# Patient Record
Sex: Female | Born: 1940 | Race: White | Hispanic: No | Smoking: Never smoker
Health system: Southern US, Community
[De-identification: ages and names within clinical notes are randomized; demographics above are authoritative.]

## PROBLEM LIST (undated history)

## (undated) DIAGNOSIS — I480 Paroxysmal atrial fibrillation: Secondary | ICD-10-CM

## (undated) DIAGNOSIS — I10 Essential (primary) hypertension: Secondary | ICD-10-CM

## (undated) DIAGNOSIS — R0600 Dyspnea, unspecified: Principal | ICD-10-CM

## (undated) DIAGNOSIS — Z0000 Encounter for general adult medical examination without abnormal findings: Secondary | ICD-10-CM

## (undated) DIAGNOSIS — E7800 Pure hypercholesterolemia, unspecified: Secondary | ICD-10-CM

## (undated) DIAGNOSIS — G4733 Obstructive sleep apnea (adult) (pediatric): Secondary | ICD-10-CM

## (undated) DIAGNOSIS — R928 Other abnormal and inconclusive findings on diagnostic imaging of breast: Secondary | ICD-10-CM

## (undated) DIAGNOSIS — L600 Ingrowing nail: Secondary | ICD-10-CM

## (undated) DIAGNOSIS — M1612 Unilateral primary osteoarthritis, left hip: Secondary | ICD-10-CM

## (undated) DIAGNOSIS — R112 Nausea with vomiting, unspecified: Secondary | ICD-10-CM

## (undated) DIAGNOSIS — Z9889 Other specified postprocedural states: Secondary | ICD-10-CM

## (undated) DIAGNOSIS — Z96653 Presence of artificial knee joint, bilateral: Secondary | ICD-10-CM

## (undated) DIAGNOSIS — IMO0002 Reserved for concepts with insufficient information to code with codable children: Secondary | ICD-10-CM

## (undated) DIAGNOSIS — R002 Palpitations: Secondary | ICD-10-CM

## (undated) DIAGNOSIS — Z923 Personal history of irradiation: Secondary | ICD-10-CM

## (undated) DIAGNOSIS — C50919 Malignant neoplasm of unspecified site of unspecified female breast: Secondary | ICD-10-CM

## (undated) DIAGNOSIS — R32 Unspecified urinary incontinence: Secondary | ICD-10-CM

## (undated) DIAGNOSIS — IMO0001 Reserved for inherently not codable concepts without codable children: Secondary | ICD-10-CM

## (undated) DIAGNOSIS — M2550 Pain in unspecified joint: Secondary | ICD-10-CM

## (undated) DIAGNOSIS — D126 Benign neoplasm of colon, unspecified: Secondary | ICD-10-CM

## (undated) DIAGNOSIS — Z973 Presence of spectacles and contact lenses: Secondary | ICD-10-CM

## (undated) DIAGNOSIS — M549 Dorsalgia, unspecified: Secondary | ICD-10-CM

## (undated) DIAGNOSIS — E785 Hyperlipidemia, unspecified: Secondary | ICD-10-CM

## (undated) DIAGNOSIS — R609 Edema, unspecified: Secondary | ICD-10-CM

## (undated) DIAGNOSIS — I4891 Unspecified atrial fibrillation: Secondary | ICD-10-CM

## (undated) DIAGNOSIS — K219 Gastro-esophageal reflux disease without esophagitis: Secondary | ICD-10-CM

## (undated) DIAGNOSIS — R0602 Shortness of breath: Secondary | ICD-10-CM

## (undated) HISTORY — DX: Dorsalgia, unspecified: M54.9

## (undated) HISTORY — PX: UPPER GI ENDOSCOPY: SHX6162

## (undated) HISTORY — DX: Unspecified atrial fibrillation: I48.91

## (undated) HISTORY — DX: Unspecified urinary incontinence: R32

## (undated) HISTORY — DX: Reserved for concepts with insufficient information to code with codable children: IMO0002

## (undated) HISTORY — DX: Shortness of breath: R06.02

## (undated) HISTORY — DX: Pain in unspecified joint: M25.50

## (undated) HISTORY — DX: Reserved for inherently not codable concepts without codable children: IMO0001

## (undated) HISTORY — DX: Edema, unspecified: R60.9

## (undated) HISTORY — DX: Hyperlipidemia, unspecified: E78.5

## (undated) HISTORY — DX: Benign neoplasm of colon, unspecified: D12.6

## (undated) HISTORY — DX: Palpitations: R00.2

## (undated) HISTORY — DX: Malignant neoplasm of unspecified site of unspecified female breast: C50.919

## (undated) HISTORY — DX: Gastro-esophageal reflux disease without esophagitis: K21.9

## (undated) HISTORY — DX: Essential (primary) hypertension: I10

---
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
## 1966-01-01 HISTORY — PX: ECTOPIC PREGNANCY SURGERY: SHX613

## 1974-01-01 HISTORY — PX: TUBAL LIGATION: SHX77

## 1998-10-11 ENCOUNTER — Encounter: Admission: RE | Admit: 1998-10-11 | Discharge: 1998-10-11 | Payer: Self-pay | Admitting: Family Medicine

## 1998-10-25 ENCOUNTER — Other Ambulatory Visit: Admission: RE | Admit: 1998-10-25 | Discharge: 1998-10-25 | Payer: Self-pay | Admitting: Family Medicine

## 1998-11-07 ENCOUNTER — Encounter: Admission: RE | Admit: 1998-11-07 | Discharge: 1998-11-07 | Payer: Self-pay | Admitting: Family Medicine

## 1998-11-07 ENCOUNTER — Encounter: Payer: Self-pay | Admitting: Family Medicine

## 1999-01-02 HISTORY — PX: KNEE ARTHROSCOPY: SUR90

## 1999-03-21 ENCOUNTER — Encounter: Admission: RE | Admit: 1999-03-21 | Discharge: 1999-03-21 | Payer: Self-pay | Admitting: Family Medicine

## 1999-03-21 ENCOUNTER — Encounter: Payer: Self-pay | Admitting: Family Medicine

## 1999-03-23 ENCOUNTER — Encounter: Payer: Self-pay | Admitting: Family Medicine

## 1999-03-23 ENCOUNTER — Encounter: Admission: RE | Admit: 1999-03-23 | Discharge: 1999-03-23 | Payer: Self-pay | Admitting: Family Medicine

## 1999-05-31 ENCOUNTER — Ambulatory Visit (HOSPITAL_COMMUNITY): Admission: RE | Admit: 1999-05-31 | Discharge: 1999-05-31 | Payer: Self-pay | Admitting: Gastroenterology

## 1999-05-31 ENCOUNTER — Encounter: Payer: Self-pay | Admitting: Gastroenterology

## 1999-07-26 ENCOUNTER — Encounter: Admission: RE | Admit: 1999-07-26 | Discharge: 1999-07-26 | Payer: Self-pay | Admitting: Internal Medicine

## 1999-07-26 ENCOUNTER — Encounter: Payer: Self-pay | Admitting: Internal Medicine

## 1999-10-18 ENCOUNTER — Encounter: Payer: Self-pay | Admitting: Family Medicine

## 1999-10-18 ENCOUNTER — Encounter: Admission: RE | Admit: 1999-10-18 | Discharge: 1999-10-18 | Payer: Self-pay | Admitting: Family Medicine

## 2000-09-10 ENCOUNTER — Other Ambulatory Visit: Admission: RE | Admit: 2000-09-10 | Discharge: 2000-09-10 | Payer: Self-pay | Admitting: Family Medicine

## 2000-10-18 ENCOUNTER — Encounter: Admission: RE | Admit: 2000-10-18 | Discharge: 2000-10-18 | Payer: Self-pay | Admitting: Family Medicine

## 2000-10-18 ENCOUNTER — Encounter: Payer: Self-pay | Admitting: Family Medicine

## 2001-06-05 ENCOUNTER — Encounter: Admission: RE | Admit: 2001-06-05 | Discharge: 2001-06-05 | Payer: Self-pay | Admitting: Family Medicine

## 2001-06-05 ENCOUNTER — Encounter: Payer: Self-pay | Admitting: Family Medicine

## 2005-05-16 ENCOUNTER — Emergency Department (HOSPITAL_COMMUNITY): Admission: EM | Admit: 2005-05-16 | Discharge: 2005-05-16 | Payer: Self-pay | Admitting: Emergency Medicine

## 2006-01-01 HISTORY — PX: CARDIAC CATHETERIZATION: SHX172

## 2006-12-10 ENCOUNTER — Ambulatory Visit: Payer: Self-pay | Admitting: Cardiology

## 2006-12-11 ENCOUNTER — Inpatient Hospital Stay (HOSPITAL_BASED_OUTPATIENT_CLINIC_OR_DEPARTMENT_OTHER): Admission: RE | Admit: 2006-12-11 | Discharge: 2006-12-11 | Payer: Self-pay | Admitting: Cardiology

## 2006-12-11 ENCOUNTER — Ambulatory Visit: Payer: Self-pay | Admitting: Cardiology

## 2006-12-16 ENCOUNTER — Ambulatory Visit: Payer: Self-pay

## 2006-12-16 ENCOUNTER — Encounter: Payer: Self-pay | Admitting: Cardiology

## 2006-12-18 ENCOUNTER — Ambulatory Visit: Payer: Self-pay | Admitting: Cardiology

## 2008-09-22 ENCOUNTER — Encounter: Admission: RE | Admit: 2008-09-22 | Discharge: 2008-09-22 | Payer: Self-pay | Admitting: Internal Medicine

## 2009-07-18 ENCOUNTER — Emergency Department (HOSPITAL_COMMUNITY): Admission: EM | Admit: 2009-07-18 | Discharge: 2009-07-18 | Payer: Self-pay | Admitting: Emergency Medicine

## 2009-10-04 ENCOUNTER — Encounter: Admission: RE | Admit: 2009-10-04 | Discharge: 2009-10-04 | Payer: Self-pay | Admitting: Internal Medicine

## 2010-01-11 ENCOUNTER — Encounter: Payer: Self-pay | Admitting: Gastroenterology

## 2010-02-01 ENCOUNTER — Encounter: Payer: Self-pay | Admitting: Internal Medicine

## 2010-02-02 NOTE — Letter (Signed)
Summary: Colonoscopy Letter   Gastroenterology  [address] [place], [place] [address]   Phone: [phone]  Fax: [phone]      January 11, 2010 MRN: [id number]   Gabriella Walker [address] [place], [place]  [address]   Dear Ms. Suchocki,   According to your medical record, it is time for you to schedule a Colonoscopy. The American Cancer Society recommends this procedure as a method to detect early colon cancer. Patients with a family history of colon cancer, or a personal history of colon polyps or inflammatory bowel disease are at increased risk.  This letter has been generated based on the recommendations made at the time of your procedure. If you feel that in your particular situation this may no longer apply, please contact our office.  Please call our office at [phone] to schedule this appointment or to update your records at your earliest convenience.  Thank you for cooperating with [place] to provide you with the very best care possible.   Sincerely,  Judie Petit T. Russella Dar, M.D.  [facility] Gastroenterology [facility] [phone]

## 2010-02-14 ENCOUNTER — Other Ambulatory Visit: Payer: Self-pay | Admitting: Internal Medicine

## 2010-02-14 DIAGNOSIS — Z1231 Encounter for screening mammogram for malignant neoplasm of breast: Secondary | ICD-10-CM

## 2010-02-28 ENCOUNTER — Ambulatory Visit
Admission: RE | Admit: 2010-02-28 | Discharge: 2010-02-28 | Disposition: A | Discharge status: Home / Self Care | Payer: Medicare Other | Source: Ambulatory Visit | Attending: Internal Medicine | Admitting: Internal Medicine

## 2010-02-28 DIAGNOSIS — Z1231 Encounter for screening mammogram for malignant neoplasm of breast: Secondary | ICD-10-CM

## 2010-05-16 NOTE — Assessment & Plan Note (Signed)
[facility]                            CARDIOLOGY OFFICE NOTE   HEILEY, SHAIKH                      MRN:          [id number]  DATE:12/18/2006                            DOB:          February 10, 1940    Gabriella Walker is a pleasant female that I recently evaluated for  exertional dyspnea/chest pain.  Her symptoms, I felt, were very  concerning for coronary disease.  We ultimately scheduled the patient  for a cardiac catheterization which was performed on December 11, 2006  by Dr. Riley Kill.  She was found to have no obstructive coronary disease.  Her left ventricular function was normal.  Dr. Riley Kill felt that there  was a wide pulse pressure, and an echocardiogram was ordered and  performed on December 16, 2006.  Her left ventricular function was  normal.  There was mild mitral regurgitation.  The left atrium was  mildly dilated.  There was mild tricuspid regurgitation.  Note, the  right ventricle and right atrium were normal in size.  There is mention  that the aortic root was mildly dilated, but the measurement was 3.5 cm.  Since that time, she has had some dyspnea on exertion as well as chest  heaviness.  This does not occur with rest.  There is no orthopnea, PND,  or pedal edema.  Note, she did travel to [place] in November.   PRESENT MEDICATIONS:  1. Diovan 160 mg p.o. daily.  2. Zocor 20 mg p.o. daily.  3. Metoprolol ER 50 mg p.o. daily.  4. Imdur 15 mg p.o. daily.   PHYSICAL EXAMINATION:  Shows a blood pressure of 127/69 and the pulse  was 59.  She weighs 201 pounds.  Her HEENT is normal.  Her neck is supple.  Her chest is clear.  Cardiovascular exam reveals a regular rate and rhythm.  Abdominal exam shows no tenderness.  Extremities show no edema.   DIAGNOSES:  1. Continued exertional dyspnea/chest pain:  Mrs. Salts      catheterization showed no obstructive coronary disease and normal      left ventricular function.  The  etiology of her symptoms is unclear      to me.  Given her recent travel to [place], I think we need      to consider pulmonary embolus.  I will schedule her to have a D-      dimer.  If this is elevated, then she will need a CT scan.  If it      is normal, then we will not pursue further evaluation and I will      ask her to follow up with her primary care physician.  We will also      discontinue her Imdur.  2. Hyperlipidemia:  She will continue on her statin, and this is being      managed by Dr. Duaine Dredge.  3. Hypertension:  Her blood pressure is adequately controlled.   We will see her back on an as-needed basis if her D-dimer is normal.     [name]. Jens Som, MD, [facility]  Electronically Signed  BSC/MedQ  DD: 12/18/2006  DT: 12/18/2006  Job #: [id number]   cc:   Mosetta Putt, M.D.

## 2010-05-16 NOTE — Assessment & Plan Note (Signed)
[facility]                            CARDIOLOGY OFFICE NOTE   NIANI, MOURER                      MRN:          [id number]  DATE:12/10/2006                            DOB:          [birth year]    Gabriella Walker is a very pleasant 70 year old female who I am asked to  evaluate for chest pain and dyspnea.   The patient has no prior cardiac history.  In the past she has not had  dyspnea on exertion, orthopnea, PND, pedal edema, palpitations,  presyncope, syncope or chest pain.  Over the past 3-4 weeks she has  noticed increased weakness as well as shortness of breath with exertion.  There is no orthopnea, PND or pedal edema.  She has also had a chest  pressure with exertion, that is relieved with rest.  This typically  occurs with more extreme activities, but not with routine activities  around the house.  She has had no symptoms at rest.  The pain does not  radiate, no is it pleuritic or positional.  It is not related to food.  There is no associated nausea, vomiting, shortness of breath or  diaphoresis.  It typically lasts for approximately 5 minutes and  resolves promptly with discontinuing activities.  Note, she has not had  any in the past 48 hours.  Because of the above, we were asked to  further evaluate.   CURRENT MEDICATIONS:  1. Diovan 160 mg p.o. daily.  2. Zocor 20 mg p.o. daily.  3. Toprol 50 mg p.o. daily.   ALLERGIES:  She has NO KNOWN DRUG ALLERGIES.   SOCIAL HISTORY:  She does not smoke nor does she consume alcohol.   FAMILY HISTORY:  Positive for coronary artery disease, as her father had  a myocardial infarction at age 68.   PAST MEDICAL HISTORY:  Significant for hypertension and hyperlipidemia;  but there is no diabetes mellitus.  She has had no other medical  problems.  She does have a history of a tubal pregnancy as well as an  appendectomy.   REVIEW OF SYSTEMS:  She denies any headaches, fevers or chills.  There  is no productive cough or hemoptysis.  There is no dysphagia,  odynophagia, melena or hematochezia.  There is no dysuria or hematuria.  There is no seizure activity.  There is no orthopnea, PND or pedal  edema.  There is no claudication noted.  Remainder of the systems is  negative.   PHYSICAL EXAMINATION:  Today, shows a blood pressure of 129/75, pulse  69.  She weighs 202 pounds.  She is well developed and well nourished;  no acute distress.  Her skin is warm and dry.  She does not appear to be  depressed, and there is no peripheral clubbing.  Her back is normal.  HEENT:  Normal, with normal eyelids.  NECK:  Supple with a normal upstroke bilaterally.  There were no bruits  noted.  There was no jugular venous distention and no thyromegaly is  noted.  CHEST:  Clear to auscultation with normal expansion.  CARDIOVASCULAR:  Reveals a regular  rate and rhythm, normal S1 and S2.  There are no murmurs, rubs or gallops noted.  ABDOMEN:  Nontender, nondistended.  Positive bowel sounds.  No  hepatosplenomegaly and no masses appreciated.  There is no abdominal  bruit.  She has 2+ femoral pulses bilaterally.  No bruits.  EXTREMITIES:  Showed no edema that I could palpate, no cords.  She has  2+ posterior tibial pulses bilaterally.  NEUROLOGIC:  Exam was grossly intact.   EKG:  Her echocardiogram shows a sinus rhythm at a rate of 69.  There  are nonspecific ST changes.   CHEST X-RAY:  (December 05, 2006)  Shows no active disease.   LABORATORY STUDIES:  White blood cell count 7.6, hemoglobin 13.8,  hematocrit 39.6.  Her platelet count is 203.  Renal function is normal,  with a BUN and creatinine of 14 and 0.75.  Cholesterol 170, LDL 70.   DIAGNOSES:  1. 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  Ms. Chiu chest pain is consistent      with angina.  It has been present for approximately one month.      They occur with exertion and are relieved with rest.  Note, this      occurs with more extreme  activities, but not with routine      activities around the house.  She has had no symptoms at rest.  I      have discussed the options with Ms. Maiorino.  I feel that cardiac      catheterization is warranted.  The risks and benefits have been      discussed, and she agrees to proceed.  She is trying to make      arrangements for care for her husband at home, who has had a      previous stroke.  Note, she has had no symptoms in the past 48      hours.  She will continue on her aspirin, Lopressor and statin.  I      will also add Imdur 30 mg p.o. daily.  I have instructed her to      limit her activities until her catheterization, and if she has any      symptoms at rest then EMS should be contacted and she should be      taken to the hospital.  She does understand this.  We will make      further recommendations once we know her anatomy.  2. HYPERLIPIDEMIA.  She will continue on her statin.  If she indeed      does have coronary disease, then we will need to be aggressive with      her lipids.  Our goal LDL will be less than 70.  3. HYPERTENSION.  Her blood pressure appears to be adequately      controlled on her present medications.   I will arrange to see her back in 4 weeks.  In the meantime, she will  undergo cardiac catheterization and revascularization as indicated.     [name] Jens Som, MD, [facility]  Electronically Signed   BSC/MedQ  DD: 12/10/2006  DT: 12/11/2006  Job #: [id number]

## 2010-05-16 NOTE — Cardiovascular Report (Signed)
[name], Gabriella Walker               ACCOUNT NO.:  [id number]   MEDICAL RECORD NO.:  [id number]          PATIENT TYPE:  OIB   LOCATION:  NA                           FACILITY:  [facility]   PHYSICIAN:  Arturo Morton. Riley Kill, MD, FACCDATE OF BIRTH:  August 05, 1940   DATE OF PROCEDURE:  DATE OF DISCHARGE:                            CARDIAC CATHETERIZATION   INDICATIONS:  The patient is a 70 year old female who has had some  shortness of breath as well as some chest discomfort.  She has no  orthopnea, PND or pedal edema.  She has had no symptoms at rest.  Her  examination was unremarkable.  Chest x-ray from December 4 revealed no  active disease.  LDL cholesterol 70.  Dr. Jens Walker was worried about her  symptoms and recommended cardiac catheterization.  She does have a  history of hypertension.  She has not had an echocardiogram.   PROCEDURE:  1. Left heart catheterization  2. Selective coronary arteriography.  3. Selective left ventriculography.   DESCRIPTION OF PROCEDURE:  The patient was brought to the  catheterization laboratory and prepped and draped in usual fashion.  Through an anterior puncture the right femoral artery was easily  entered.  A 4-French sheath was placed.  Following this, views of left  and right coronaries arteries were obtained, central aortic and left  ventricular pressures measured with pigtail. Ventriculography was then  performed in the RAO projection.  There were no complications.  She  tolerated the procedure well.  Subsequent to this, her catheter was  removed on the table and I spoke Dr. Jens Walker after the case.   Initial central aortic pressure was 121/56 with a mean of 82.  Left  ventricular pressure was 123/15.  There was no significant gradient on  pullback across the aortic valve.   ANGIOGRAPHIC DATA:  1. Ventriculography in the RAO projection reveals vigorous global      systolic function.  Aortic leaflets appear to move well.  The aorta      itself looks  relatively smooth.  2. The left main coronary artery is free of critical disease.  3. The LAD courses to the apex.  There is minor luminal      irregularities.  Areas of high-grade stenosis were not noted on any      of views.  There is a diagonal that has minimal luminal      irregularity at the ostium, but no evidence of high-grade      obstruction.  4. The circumflex consists predominantly of a bifurcating large      marginal branch that appears free of critical disease.  5. The right coronary artery is also fairly large and relatively      smooth. It is a fairly large lumen providing a posterior descending      branch, a small posterolateral and larger posterolateral branch.      The posterior descending may have about 30% narrowing at the      ostium.  This does not appear to be hemodynamically significant.   CONCLUSION:  1. Normal left ventricular function  2. Minor coronary  irregularities   PLAN:  Dr. Jens Walker was impressed with her symptoms.  Based on this, we  will get an early 2-D echo with early follow-up in the office.      Arturo Morton. Riley Kill, MD, [facility]  Electronically Signed     TDS/MEDQ  D:  12/11/2006  T:  12/11/2006  Job:  [id number]   cc:   Gabriella Walker. Gabriella Som, MD, [facility]

## 2011-03-13 ENCOUNTER — Other Ambulatory Visit: Payer: Self-pay | Admitting: Internal Medicine

## 2011-03-13 DIAGNOSIS — Z1231 Encounter for screening mammogram for malignant neoplasm of breast: Secondary | ICD-10-CM

## 2011-03-26 DIAGNOSIS — H2510 Age-related nuclear cataract, unspecified eye: Secondary | ICD-10-CM | POA: Diagnosis not present

## 2011-04-03 ENCOUNTER — Ambulatory Visit
Admission: RE | Admit: 2011-04-03 | Discharge: 2011-04-03 | Disposition: A | Discharge status: Home / Self Care | Payer: Medicare Other | Source: Ambulatory Visit | Attending: Internal Medicine | Admitting: Internal Medicine

## 2011-04-03 DIAGNOSIS — Z1231 Encounter for screening mammogram for malignant neoplasm of breast: Secondary | ICD-10-CM | POA: Diagnosis not present

## 2011-09-11 DIAGNOSIS — T148 Other injury of unspecified body region: Secondary | ICD-10-CM | POA: Diagnosis not present

## 2011-09-11 DIAGNOSIS — I10 Essential (primary) hypertension: Secondary | ICD-10-CM | POA: Diagnosis not present

## 2011-09-11 DIAGNOSIS — IMO0001 Reserved for inherently not codable concepts without codable children: Secondary | ICD-10-CM | POA: Diagnosis not present

## 2011-09-11 DIAGNOSIS — R21 Rash and other nonspecific skin eruption: Secondary | ICD-10-CM | POA: Diagnosis not present

## 2011-09-11 DIAGNOSIS — M542 Cervicalgia: Secondary | ICD-10-CM | POA: Diagnosis not present

## 2011-09-17 ENCOUNTER — Encounter: Payer: Self-pay | Admitting: Gastroenterology

## 2011-10-03 DIAGNOSIS — M899 Disorder of bone, unspecified: Secondary | ICD-10-CM | POA: Diagnosis not present

## 2011-10-03 DIAGNOSIS — R7301 Impaired fasting glucose: Secondary | ICD-10-CM | POA: Diagnosis not present

## 2011-10-03 DIAGNOSIS — I10 Essential (primary) hypertension: Secondary | ICD-10-CM | POA: Diagnosis not present

## 2011-10-03 DIAGNOSIS — M949 Disorder of cartilage, unspecified: Secondary | ICD-10-CM | POA: Diagnosis not present

## 2011-10-03 DIAGNOSIS — E785 Hyperlipidemia, unspecified: Secondary | ICD-10-CM | POA: Diagnosis not present

## 2011-10-10 DIAGNOSIS — Z0000 Encounter for general adult medical examination without abnormal findings: Secondary | ICD-10-CM | POA: Diagnosis not present

## 2011-10-10 DIAGNOSIS — R82998 Other abnormal findings in urine: Secondary | ICD-10-CM | POA: Diagnosis not present

## 2011-10-10 DIAGNOSIS — I10 Essential (primary) hypertension: Secondary | ICD-10-CM | POA: Diagnosis not present

## 2011-10-10 DIAGNOSIS — R609 Edema, unspecified: Secondary | ICD-10-CM | POA: Diagnosis not present

## 2011-10-10 DIAGNOSIS — R7301 Impaired fasting glucose: Secondary | ICD-10-CM | POA: Diagnosis not present

## 2011-10-10 DIAGNOSIS — Z23 Encounter for immunization: Secondary | ICD-10-CM | POA: Diagnosis not present

## 2011-10-10 DIAGNOSIS — Z1331 Encounter for screening for depression: Secondary | ICD-10-CM | POA: Diagnosis not present

## 2011-10-11 DIAGNOSIS — Z1212 Encounter for screening for malignant neoplasm of rectum: Secondary | ICD-10-CM | POA: Diagnosis not present

## 2011-10-30 DIAGNOSIS — L821 Other seborrheic keratosis: Secondary | ICD-10-CM | POA: Diagnosis not present

## 2011-10-30 DIAGNOSIS — L280 Lichen simplex chronicus: Secondary | ICD-10-CM | POA: Diagnosis not present

## 2012-03-10 ENCOUNTER — Other Ambulatory Visit: Payer: Self-pay

## 2012-03-10 DIAGNOSIS — Z1231 Encounter for screening mammogram for malignant neoplasm of breast: Secondary | ICD-10-CM

## 2012-03-25 DIAGNOSIS — H2510 Age-related nuclear cataract, unspecified eye: Secondary | ICD-10-CM | POA: Diagnosis not present

## 2012-04-07 ENCOUNTER — Ambulatory Visit
Admission: RE | Admit: 2012-04-07 | Discharge: 2012-04-07 | Disposition: A | Discharge status: Home / Self Care | Payer: Medicare Other | Source: Ambulatory Visit

## 2012-04-07 DIAGNOSIS — Z1231 Encounter for screening mammogram for malignant neoplasm of breast: Secondary | ICD-10-CM

## 2012-05-27 DIAGNOSIS — R05 Cough: Secondary | ICD-10-CM | POA: Diagnosis not present

## 2012-05-27 DIAGNOSIS — I10 Essential (primary) hypertension: Secondary | ICD-10-CM | POA: Diagnosis not present

## 2012-05-27 DIAGNOSIS — J309 Allergic rhinitis, unspecified: Secondary | ICD-10-CM | POA: Diagnosis not present

## 2012-05-27 DIAGNOSIS — R059 Cough, unspecified: Secondary | ICD-10-CM | POA: Diagnosis not present

## 2012-05-30 DIAGNOSIS — M278 Other specified diseases of jaws: Secondary | ICD-10-CM | POA: Diagnosis not present

## 2012-08-22 DIAGNOSIS — Z6834 Body mass index (BMI) 34.0-34.9, adult: Secondary | ICD-10-CM | POA: Diagnosis not present

## 2012-08-22 DIAGNOSIS — I10 Essential (primary) hypertension: Secondary | ICD-10-CM | POA: Diagnosis not present

## 2012-08-22 DIAGNOSIS — R609 Edema, unspecified: Secondary | ICD-10-CM | POA: Diagnosis not present

## 2012-10-08 DIAGNOSIS — E785 Hyperlipidemia, unspecified: Secondary | ICD-10-CM | POA: Diagnosis not present

## 2012-10-08 DIAGNOSIS — R7301 Impaired fasting glucose: Secondary | ICD-10-CM | POA: Diagnosis not present

## 2012-10-08 DIAGNOSIS — I10 Essential (primary) hypertension: Secondary | ICD-10-CM | POA: Diagnosis not present

## 2012-10-16 ENCOUNTER — Encounter: Payer: Self-pay | Admitting: Gastroenterology

## 2012-10-16 DIAGNOSIS — R002 Palpitations: Secondary | ICD-10-CM | POA: Diagnosis not present

## 2012-10-16 DIAGNOSIS — G43909 Migraine, unspecified, not intractable, without status migrainosus: Secondary | ICD-10-CM | POA: Diagnosis not present

## 2012-10-16 DIAGNOSIS — Z1331 Encounter for screening for depression: Secondary | ICD-10-CM | POA: Diagnosis not present

## 2012-10-16 DIAGNOSIS — E785 Hyperlipidemia, unspecified: Secondary | ICD-10-CM | POA: Diagnosis not present

## 2012-10-16 DIAGNOSIS — Z23 Encounter for immunization: Secondary | ICD-10-CM | POA: Diagnosis not present

## 2012-10-16 DIAGNOSIS — Z0000 Encounter for general adult medical examination without abnormal findings: Secondary | ICD-10-CM | POA: Diagnosis not present

## 2012-10-16 DIAGNOSIS — R82998 Other abnormal findings in urine: Secondary | ICD-10-CM | POA: Diagnosis not present

## 2012-10-16 DIAGNOSIS — R609 Edema, unspecified: Secondary | ICD-10-CM | POA: Diagnosis not present

## 2012-10-16 DIAGNOSIS — Z1212 Encounter for screening for malignant neoplasm of rectum: Secondary | ICD-10-CM | POA: Diagnosis not present

## 2012-10-16 DIAGNOSIS — R7301 Impaired fasting glucose: Secondary | ICD-10-CM | POA: Diagnosis not present

## 2012-10-16 DIAGNOSIS — R079 Chest pain, unspecified: Secondary | ICD-10-CM | POA: Diagnosis not present

## 2012-10-16 DIAGNOSIS — I10 Essential (primary) hypertension: Secondary | ICD-10-CM | POA: Diagnosis not present

## 2012-10-17 ENCOUNTER — Encounter: Payer: Self-pay | Admitting: *Deleted

## 2012-10-17 ENCOUNTER — Encounter: Payer: Self-pay | Admitting: Cardiology

## 2012-10-17 DIAGNOSIS — R079 Chest pain, unspecified: Secondary | ICD-10-CM | POA: Insufficient documentation

## 2012-10-17 DIAGNOSIS — E785 Hyperlipidemia, unspecified: Secondary | ICD-10-CM | POA: Insufficient documentation

## 2012-10-17 DIAGNOSIS — I10 Essential (primary) hypertension: Secondary | ICD-10-CM | POA: Insufficient documentation

## 2012-10-20 ENCOUNTER — Ambulatory Visit (INDEPENDENT_AMBULATORY_CARE_PROVIDER_SITE_OTHER): Payer: Medicare Other | Admitting: Cardiology

## 2012-10-20 ENCOUNTER — Encounter: Payer: Self-pay | Admitting: Cardiology

## 2012-10-20 VITALS — BP 134/66 | HR 70 | Ht 63.0 in | Wt 198.1 lb

## 2012-10-20 DIAGNOSIS — R002 Palpitations: Secondary | ICD-10-CM

## 2012-10-20 DIAGNOSIS — E785 Hyperlipidemia, unspecified: Secondary | ICD-10-CM

## 2012-10-20 DIAGNOSIS — R079 Chest pain, unspecified: Secondary | ICD-10-CM

## 2012-10-20 DIAGNOSIS — I10 Essential (primary) hypertension: Secondary | ICD-10-CM

## 2012-10-20 NOTE — Assessment & Plan Note (Signed)
Her blood pressure is controlled. Continue present medications.

## 2012-10-20 NOTE — Progress Notes (Signed)
     HPI: 72 year old female for evaluation of palpitations. Echocardiogram in December of 2008 showed normal LV function. There was mild left atrial enlargement, mildly dilated aortic root and mild mitral regurgitation. Cardiac catheterization in December of 2008 showed a 30% posterior descending but otherwise no obstructive coronary disease. The LV function was normal. Patient has dyspnea on exertion which is chronic. No orthopnea or PND. Occasional minimal pedal edema. She does not have exertional chest pain although she occasionally has pain with lying flat improved with sitting up. She recently missed a metoprolol dose and 4 hours later developed palpitations with a heart rate of 130. She took a second dose with improvement. She has had occasional palpitations since described as her heart rate elevated more than normal. It will be approximately 90. No syncope. Because of the above we were asked to evaluate.  Current Outpatient Prescriptions  Medication Sig Dispense Refill  . fluticasone (FLONASE) 50 MCG/ACT nasal spray as directed.      . furosemide (LASIX) 40 MG tablet prn      . KLOR-CON M20 20 MEQ tablet prn      . metoprolol (LOPRESSOR) 50 MG tablet Take 1 tablet by mouth 2 (two) times daily.      . simvastatin (ZOCOR) 40 MG tablet Take 1 tablet by mouth daily.      . valsartan-hydrochlorothiazide (DIOVAN-HCT) 320-25 MG per tablet Take 1 tablet by mouth daily.       No current facility-administered medications for this visit.    No Known Allergies  Past Medical History  Diagnosis Date  . HTN (hypertension)   . Hyperlipidemia     Past Surgical History  Procedure Laterality Date  . Cardiac catheterization    . Appendectomy    . Ectopic pregnancy surgery      History   Social History  . Marital Status: Widowed    Spouse Name: N/A    Number of Children: 2  . Years of Education: N/A   Occupational History  . Not on file.   Social History Main Topics  . Smoking status:  Never Smoker   . Smokeless tobacco: Not on file  . Alcohol Use: No  . Drug Use: Not on file  . Sexual Activity: Not on file   Other Topics Concern  . Not on file   Social History Narrative  . No narrative on file    Family History  Problem Relation Age of Onset  . Coronary artery disease Father     MI at age 44    ROS: arthralgias but no fevers or chills, productive cough, hemoptysis, dysphasia, odynophagia, melena, hematochezia, dysuria, hematuria, rash, seizure activity, orthopnea, PND, pedal edema, claudication. Remaining systems are negative.  Physical Exam:   Blood pressure 134/66, pulse 70, height 5\' 3"  (1.6 m), weight 198 lb 1.9 oz (89.867 kg).  General:  Well developed/well nourished in NAD Skin warm/dry Patient not depressed No peripheral clubbing Back-normal HEENT-normal/normal eyelids Neck supple/normal carotid upstroke bilaterally; no bruits; no JVD; no thyromegaly chest - CTA/ normal expansion CV - RRR/normal S1 and S2; no murmurs, rubs or gallops;  PMI nondisplaced Abdomen -NT/ND, no HSM, no mass, + bowel sounds, no bruit 2+ femoral pulses, no bruits Ext-no edema, chords, 2+ DP Neuro-grossly nonfocal  ECG sinus rhythm at a rate of 70. Nonspecific ST changes.

## 2012-10-20 NOTE — Assessment & Plan Note (Signed)
Continue statin. 

## 2012-10-20 NOTE — Patient Instructions (Signed)
Your physician recommends that you schedule a follow-up appointment in: as needed  

## 2012-10-20 NOTE — Assessment & Plan Note (Signed)
Patient's palpitations started after missing a dose of beta-blocker. She has had mild elevations in heart rate at time since stating it occasionally is 90. These do not appear to be significantly bothersome. Continue beta blocker at present dose. If her palpitations worsen we will plan a monitor to further assess. Note previous echo shows normal LV function.

## 2012-10-29 DIAGNOSIS — Z23 Encounter for immunization: Secondary | ICD-10-CM | POA: Diagnosis not present

## 2012-11-07 ENCOUNTER — Institutional Professional Consult (permissible substitution): Payer: Medicare Other | Admitting: Cardiology

## 2012-11-14 ENCOUNTER — Ambulatory Visit (INDEPENDENT_AMBULATORY_CARE_PROVIDER_SITE_OTHER): Payer: Medicare Other

## 2012-11-14 VITALS — BP 115/59 | HR 61 | Resp 16 | Ht 63.0 in | Wt 195.0 lb

## 2012-11-14 DIAGNOSIS — M79609 Pain in unspecified limb: Secondary | ICD-10-CM

## 2012-11-14 DIAGNOSIS — M79671 Pain in right foot: Secondary | ICD-10-CM

## 2012-11-14 DIAGNOSIS — M7730 Calcaneal spur, unspecified foot: Secondary | ICD-10-CM | POA: Diagnosis not present

## 2012-11-14 DIAGNOSIS — M722 Plantar fascial fibromatosis: Secondary | ICD-10-CM

## 2012-11-14 MED ORDER — MELOXICAM 15 MG PO TABS: 15.0000 mg | ORAL_TABLET | Freq: Every day | ORAL | Status: DC

## 2012-11-14 NOTE — Progress Notes (Signed)
  Subjective:    Patient ID: Gabriella Walker, female    DOB: 1940-04-14, 72 y.o.   MRN: [id number] "I have pain in my right heel when I walk."    Foot Pain This is a new problem. The current episode started more than 1 month ago. The problem occurs daily. The problem has been gradually worsening. Associated symptoms include coughing and joint swelling. The symptoms are aggravated by walking. She has tried NSAIDs and heat for the symptoms. The treatment provided moderate relief.      Review of Systems  Respiratory: Positive for cough.   Musculoskeletal: Positive for joint swelling.  All other systems reviewed and are negative.       Objective:   Physical Exam Neurovascular status is intact with pedal pulses palpable DP +2/4 PT one over 4 bilateral Refill timed 3-4 seconds. Skin temperature warm. No edema noted significant varicosities right more so than left of the lower leg ankle and even forefoot. Neurologically epicritic and Percocet sensations intact and symmetric bilateral. Orthopedic exam there is pain on palpation Magan plantar fascia from proximal to the sesamoid always the inferior calcaneal tubercle of the heel some tenderness in the posterior Achilles area as well as on dorsiflexion. However the inferior heel and arch pain is more pain tender and significant than the posterior Achilles pain. X-rays confirm well-developed inferior retrocalcaneal spurring no fracturing no signs of osseous abnormalities no cyst or tumors. Mild flexible contractures of digits noted no other complaints at this time       Assessment & Plan:  Assessment is plantar fasciitis/heel spur syndrome right with possible secondary Achilles tendinitis as well as retrocalcaneal spurring likely secondary to compensatory gait. Plan at this time patient placed in a fascial strapping of the right foot for 5 day. Also recommended ice 2 or 3 times daily as instructed to the posterior inferior heel area. Prescription for  Mobic 15 mg once daily is prescribed. Patient also will initiate using crocs and Birkenstock swishy findings to be relatively comfortable will avoid barefoot or flimsy shoes. Suggests a lace up oxford or a Velcro strap Oxford if possible as well. Followup with in the next 2 weeks for reevaluation based on progress may be candidate for steroid injection if no improvement. Also made be candidate for followup of functional orthotic scan or casting if needed.  Alvan Dame DPM

## 2012-11-14 NOTE — Patient Instructions (Signed)

## 2012-11-25 ENCOUNTER — Ambulatory Visit (AMBULATORY_SURGERY_CENTER): Payer: Self-pay | Admitting: *Deleted

## 2012-11-25 VITALS — Ht 63.0 in | Wt 201.4 lb

## 2012-11-25 DIAGNOSIS — Z1211 Encounter for screening for malignant neoplasm of colon: Secondary | ICD-10-CM

## 2012-11-25 MED ORDER — MOVIPREP 100 G PO SOLR: ORAL | Status: DC

## 2012-11-25 NOTE — Progress Notes (Signed)
No allergies to eggs or soy. No problems with anesthesia.  

## 2012-11-25 NOTE — Progress Notes (Signed)
Referral note received from Dr. Alver Fisher office for Hem positive stools after pt had PV. Was coded as screening during PV.

## 2012-12-05 ENCOUNTER — Ambulatory Visit: Payer: PRIVATE HEALTH INSURANCE

## 2012-12-10 ENCOUNTER — Encounter: Payer: Self-pay | Admitting: Gastroenterology

## 2012-12-10 ENCOUNTER — Ambulatory Visit (AMBULATORY_SURGERY_CENTER): Payer: Medicare Other | Admitting: Gastroenterology

## 2012-12-10 VITALS — BP 143/66 | HR 67 | Temp 97.7°F | Resp 20 | Ht 63.0 in | Wt 201.0 lb

## 2012-12-10 DIAGNOSIS — Z1211 Encounter for screening for malignant neoplasm of colon: Secondary | ICD-10-CM | POA: Diagnosis not present

## 2012-12-10 DIAGNOSIS — D126 Benign neoplasm of colon, unspecified: Secondary | ICD-10-CM

## 2012-12-10 DIAGNOSIS — I10 Essential (primary) hypertension: Secondary | ICD-10-CM | POA: Diagnosis not present

## 2012-12-10 DIAGNOSIS — E669 Obesity, unspecified: Secondary | ICD-10-CM | POA: Diagnosis not present

## 2012-12-10 MED ORDER — SODIUM CHLORIDE 0.9 % IV SOLN: 500.0000 mL | INTRAVENOUS | Status: DC

## 2012-12-10 NOTE — Progress Notes (Signed)
Lidocaine-40mg IV prior to Propofol InductionPropofol given over incremental dosages 

## 2012-12-10 NOTE — Progress Notes (Signed)
Patient did not experience any of the following events: a burn prior to discharge; a fall within the facility; wrong site/side/patient/procedure/implant event; or a hospital transfer or hospital admission upon discharge from the facility. (G8907) Patient did not have preoperative order for IV antibiotic SSI prophylaxis. (G8918)  

## 2012-12-10 NOTE — Op Note (Signed)
[facility] [address] [place], [address]   COLONOSCOPY PROCEDURE REPORT PATIENT: Gabriella Walker  MR#: [id number] BIRTHDATE: 1940-04-19 , 72  yrs. old GENDER: Female ENDOSCOPIST: Meryl Dare, MD, [facility] REFERRED BY:W.  Buren Kos, M.D. PROCEDURE DATE:  12/10/2012 PROCEDURE:   Colonoscopy with snare polypectomy First Screening Colonoscopy - Avg.  risk and is 50 yrs.  old or older - No.  Prior Negative Screening - Now for repeat screening. 10 or more years since last screening  History of Adenoma - Now for follow-up colonoscopy & has been > or = to 3 yrs.  N/A  Polyps Removed Today? Yes. ASA CLASS:   Class II INDICATIONS:average risk screening. MEDICATIONS: MAC sedation, administered by CRNA and propofol (Diprivan) 230mg  IV DESCRIPTION OF PROCEDURE:   After the risks benefits and alternatives of the procedure were thoroughly explained, informed consent was obtained.  A digital rectal exam revealed no abnormalities of the rectum.   The LB [id number]  endoscope was introduced through the anus and advanced to the cecum, which was identified by both the appendix and ileocecal valve. No adverse events experienced.   The quality of the prep was good, using MoviPrep  The instrument was then slowly withdrawn as the colon was fully examined.  COLON FINDINGS: A sessile polyp measuring 5 mm in size was found at the hepatic flexure.  A polypectomy was performed with a cold snare.  The resection was complete and the polyp tissue was completely retrieved.   Three sessile polyps measuring 6-9 mm in size were found in the transverse colon.  A polypectomy was performed using snare cautery.  The resection was complete and the polyp tissue was completely retrieved.   Mild diverticulosis was noted in the sigmoid colon.   The colon was otherwise normal. There was no diverticulosis, inflammation, polyps or cancers unless previously stated.  Retroflexed views revealed  internal hemorrhoids. The time to cecum=1 minutes 45 seconds.  Withdrawal time=19 minutes 55 seconds.  The scope was withdrawn and the procedure completed. COMPLICATIONS: There were no complications. ENDOSCOPIC IMPRESSION: 1.   Sessile polyp measuring 5 mm at the hepatic flexure; polypectomy performed with a cold snare 2.   Three sessile polyps, 6-9 mm, in the transverse colon; polypectomy performed using snare cautery 3.   Mild diverticulosis in the sigmoid colon 4.   Small internal hemorrhoids  RECOMMENDATIONS: 1.  Hold aspirin, aspirin products, and anti-inflammatory medication for 2 weeks. 2.  Repeat colonoscopy in 5 years if polyp(s) adenomatous; otherwise no plans for future screening colonoscopies as these types of exams usually stop around age 99. 3.  High fiber diet with liberal fluid intake.  eSigned:  Meryl Dare, MD, [facility] 12/10/2012 1:59 PM     PATIENT NAME:  Gabriella Walker, Gabriella Walker MR#: [id number]

## 2012-12-10 NOTE — Patient Instructions (Addendum)
Discharge instructions given with verbal understanding. Handouts on polyps,diverticulosis and hemorrhoids. Hold aspirin and aspirin products for 2 weeks. Resume previous medications. YOU HAD AN ENDOSCOPIC PROCEDURE TODAY AT THE [facility]: Refer to the procedure report that was given to you for any specific questions about what was found during the examination.  If the procedure report does not answer your questions, please call your gastroenterologist to clarify.  If you requested that your care partner not be given the details of your procedure findings, then the procedure report has been included in a sealed envelope for you to review at your convenience later.  YOU SHOULD EXPECT: Some feelings of bloating in the abdomen. Passage of more gas than usual.  Walking can help get rid of the air that was put into your GI tract during the procedure and reduce the bloating. If you had a lower endoscopy (such as a colonoscopy or flexible sigmoidoscopy) you may notice spotting of blood in your stool or on the toilet paper. If you underwent a bowel prep for your procedure, then you may not have a normal bowel movement for a few days.  DIET: Your first meal following the procedure should be a light meal and then it is ok to progress to your normal diet.  A half-sandwich or bowl of soup is an example of a good first meal.  Heavy or fried foods are harder to digest and may make you feel nauseous or bloated.  Likewise meals heavy in dairy and vegetables can cause extra gas to form and this can also increase the bloating.  Drink plenty of fluids but you should avoid alcoholic beverages for 24 hours.  ACTIVITY: Your care partner should take you home directly after the procedure.  You should plan to take it easy, moving slowly for the rest of the day.  You can resume normal activity the day after the procedure however you should NOT DRIVE or use heavy machinery for 24 hours (because of the sedation  medicines used during the test).    SYMPTOMS TO REPORT IMMEDIATELY: A gastroenterologist can be reached at any hour.  During normal business hours, 8:30 AM to 5:00 PM Monday through Friday, call [phone].  After hours and on weekends, please call the GI answering service at [phone] who will take a message and have the physician on call contact you.   Following lower endoscopy (colonoscopy or flexible sigmoidoscopy):  Excessive amounts of blood in the stool  Significant tenderness or worsening of abdominal pains  Swelling of the abdomen that is new, acute  Fever of 100F or higher FOLLOW UP: If any biopsies were taken you will be contacted by phone or by letter within the next 1-3 weeks.  Call your gastroenterologist if you have not heard about the biopsies in 3 weeks.  Our staff will call the home number listed on your records the next business day following your procedure to check on you and address any questions or concerns that you may have at that time regarding the information given to you following your procedure. This is a courtesy call and so if there is no answer at the home number and we have not heard from you through the emergency physician on call, we will assume that you have returned to your regular daily activities without incident.  SIGNATURES/CONFIDENTIALITY: You and/or your care partner have signed paperwork which will be entered into your electronic medical record.  These signatures attest to the fact that that the  information above on your After Visit Summary has been reviewed and is understood.  Full responsibility of the confidentiality of this discharge information lies with you and/or your care-partner.

## 2012-12-10 NOTE — Progress Notes (Signed)
Called to room to assist during endoscopic procedure.  Patient ID and intended procedure confirmed with present staff. Received instructions for my participation in the procedure from the performing physician.  

## 2012-12-11 ENCOUNTER — Telehealth: Payer: Self-pay | Admitting: *Deleted

## 2012-12-11 NOTE — Telephone Encounter (Signed)
  Follow up Call-  Call back number 12/10/2012  Post procedure Call Back phone  # [phone]  Permission to leave phone message Yes     Patient questions:  Do you have a fever, pain , or abdominal swelling? no Pain Score  0 *  Have you tolerated food without any problems? yes  Have you been able to return to your normal activities? yes  Do you have any questions about your discharge instructions: Diet   no Medications  no Follow up visit  no  Do you have questions or concerns about your Care? no  Actions: * If pain score is 4 or above: No action needed, pain <4.

## 2012-12-12 ENCOUNTER — Ambulatory Visit (INDEPENDENT_AMBULATORY_CARE_PROVIDER_SITE_OTHER): Payer: Medicare Other

## 2012-12-12 VITALS — BP 139/67 | HR 73 | Resp 18

## 2012-12-12 DIAGNOSIS — M7730 Calcaneal spur, unspecified foot: Secondary | ICD-10-CM | POA: Diagnosis not present

## 2012-12-12 DIAGNOSIS — M722 Plantar fascial fibromatosis: Secondary | ICD-10-CM | POA: Diagnosis not present

## 2012-12-12 DIAGNOSIS — M79609 Pain in unspecified limb: Secondary | ICD-10-CM | POA: Diagnosis not present

## 2012-12-12 DIAGNOSIS — M79671 Pain in right foot: Secondary | ICD-10-CM

## 2012-12-12 NOTE — Patient Instructions (Signed)

## 2012-12-12 NOTE — Progress Notes (Signed)
   Subjective:    Patient ID: Gabriella Walker, female    DOB: 1940/09/11, 72 y.o.   MRN: [id number]  HPI both my heels are better and the right was worse but is still tender    Review of Systems deferred at this visit     Objective:   Physical Exam Neurovascular status is intact and unchanged pulses are palpable epicritic and proprioceptive sensations intact and symmetric patient continues to have some mild tenderness inferior heel and arch although greatly improved after taping and utilizing the Mobic for meloxicam. Patient currently maintaining Birkenstock shoes or croc offer would benefit from orthoses for other non-support shoes. No other abnormal findings no open wounds or ulcerations noted. May still have some mild compensatory Achilles tendinitis.       Assessment & Plan:  Maintain Mobic meloxicam on an as-needed basis. 19 crocs and Birkenstock shoes dispensed 1 pair of power step insoles size 9. The patient's foot patient understands likely an uncovered by her Medicare. Followup in 2 months or an as-needed basis if she has a further flareups or exacerbations.  Alvan Dame DPM

## 2012-12-16 ENCOUNTER — Encounter: Payer: Self-pay | Admitting: Gastroenterology

## 2013-01-01 DIAGNOSIS — C50919 Malignant neoplasm of unspecified site of unspecified female breast: Secondary | ICD-10-CM

## 2013-01-01 DIAGNOSIS — Z923 Personal history of irradiation: Secondary | ICD-10-CM

## 2013-01-01 HISTORY — DX: Malignant neoplasm of unspecified site of unspecified female breast: C50.919

## 2013-01-01 HISTORY — DX: Personal history of irradiation: Z92.3

## 2013-01-20 ENCOUNTER — Encounter: Payer: Self-pay | Admitting: Gastroenterology

## 2013-02-09 ENCOUNTER — Ambulatory Visit (INDEPENDENT_AMBULATORY_CARE_PROVIDER_SITE_OTHER): Payer: Medicare Other | Admitting: Gastroenterology

## 2013-02-09 ENCOUNTER — Encounter: Payer: Self-pay | Admitting: Gastroenterology

## 2013-02-09 VITALS — BP 100/70 | HR 72 | Ht 63.0 in | Wt 205.6 lb

## 2013-02-09 DIAGNOSIS — R079 Chest pain, unspecified: Secondary | ICD-10-CM

## 2013-02-09 DIAGNOSIS — R109 Unspecified abdominal pain: Secondary | ICD-10-CM

## 2013-02-09 DIAGNOSIS — R195 Other fecal abnormalities: Secondary | ICD-10-CM

## 2013-02-09 MED ORDER — OMEPRAZOLE 20 MG PO CPDR: 20.0000 mg | DELAYED_RELEASE_CAPSULE | Freq: Every day | ORAL | Status: DC

## 2013-02-09 NOTE — Patient Instructions (Signed)
We have sent the following medications to your pharmacy for you to pick up at your convenience: Omeprazole.  You have been scheduled for an abdominal ultrasound at [facility] (1st floor of hospital) on 02/13/12 at 8:30am. Please arrive 15 minutes prior to your appointment for registration. Make certain not to have anything to eat or drink 6 hours prior to your appointment. Should you need to reschedule your appointment, please contact radiology at [phone]. This test typically takes about 30 minutes to perform.  You have been scheduled for an endoscopy with propofol. Please follow written instructions given to you at your visit today. If you use inhalers (even only as needed), please bring them with you on the day of your procedure.  Thank you for choosing me and [facility].  Pricilla Riffle. Dagoberto Ligas., MD., Marval Regal  cc: Lutricia Feil, MD

## 2013-02-09 NOTE — Progress Notes (Signed)
    History of Present Illness: This is a 73 year old female with frequent epigastric pain, bilateral costal margin pain and lower abdominal pain that occurs intermittently. Her symptoms have been present for many years without a clear etiology. The pains can occur at any time. She occasionally notes the pains following meals. She underwent colonoscopy in December 2014 showing diverticulosis, internal hemorrhoids and sessile serrated adenomas. She had an evaluation in 2001 for upper abdominal pain that included an abdominal ultrasound, an upper GI series, a CT scan and a HIDA scan with CCK. Fatty liver and 2 smallliver cysts were noted otherwise they were otherwis unremarkable. She states she has tried PPIs in the past without an impact on her symptoms. Denies weight loss, constipation, diarrhea, change in stool caliber, melena, hematochezia, nausea, vomiting, dysphagia, reflux symptoms, chest pain.  Review of Systems: Pertinent positive and negative review of systems were noted in the above HPI section. All other review of systems were otherwise negative.  Current Medications, Allergies, Past Medical History, Past Surgical History, Family History and Social History were reviewed in [employer] record.  Physical Exam: General: Well developed , well nourished, no acute distress Head: Normocephalic and atraumatic Eyes:  sclerae anicteric, EOMI Ears: Normal auditory acuity Mouth: No deformity or lesions Neck: Supple, no masses or thyromegaly Lungs: Clear throughout to auscultation, bilateral costochondral margin tenderness, L >R Heart: Regular rate and rhythm; no murmurs, rubs or bruits Abdomen: Soft, mild epigastric and suprapubic tenderness to deep palpation and non distended. No masses, hepatosplenomegaly or hernias noted. Normal Bowel sounds Rectal: Unremarkable at recent colonoscopy, not repeated Musculoskeletal: Symmetrical with no gross deformities  Skin: No lesions on  visible extremities Pulses:  Normal pulses noted Extremities: No clubbing, cyanosis, edema or deformities noted Neurological: Alert oriented x 4, grossly nonfocal Cervical Nodes:  No significant cervical adenopathy Inguinal Nodes: No significant inguinal adenopathy Psychological:  Alert and cooperative. Normal mood and affect  Assessment and Recommendations:  1. Abdominal pain, multiple sites, etiology unclear. Costochondral margin pain which appears to be musculoskeletal in etiology. Begin omeprazole 20 mg daily. Schedule upper endoscopy and abdominal ultrasound. The risks, benefits, and alternatives to endoscopy with possible biopsy and possible dilation were discussed with the patient and they consent to proceed. Consider NSAIDs for management of musculoskeletal symptoms following completion of GI evaluation.  2. Personal history of adenomatous colon polyps. Surveillance colonoscopy recommended December 2019.  3. History of fatty liver. Reassess with abdominal ultrasound.

## 2013-02-12 ENCOUNTER — Ambulatory Visit (HOSPITAL_COMMUNITY)
Admission: RE | Admit: 2013-02-12 | Discharge: 2013-02-12 | Disposition: A | Discharge status: Home / Self Care | Payer: Medicare Other | Source: Ambulatory Visit | Attending: Gastroenterology | Admitting: Gastroenterology

## 2013-02-12 DIAGNOSIS — R195 Other fecal abnormalities: Secondary | ICD-10-CM

## 2013-02-12 DIAGNOSIS — I10 Essential (primary) hypertension: Secondary | ICD-10-CM | POA: Diagnosis not present

## 2013-02-12 DIAGNOSIS — R109 Unspecified abdominal pain: Secondary | ICD-10-CM | POA: Diagnosis not present

## 2013-02-12 DIAGNOSIS — K7689 Other specified diseases of liver: Secondary | ICD-10-CM | POA: Diagnosis not present

## 2013-02-19 ENCOUNTER — Encounter: Payer: Medicare Other | Admitting: Gastroenterology

## 2013-03-17 ENCOUNTER — Ambulatory Visit (AMBULATORY_SURGERY_CENTER): Payer: Medicare Other | Admitting: Gastroenterology

## 2013-03-17 ENCOUNTER — Encounter: Payer: Self-pay | Admitting: Gastroenterology

## 2013-03-17 VITALS — BP 125/67 | HR 58 | Temp 97.7°F | Resp 15 | Ht 63.0 in | Wt 205.0 lb

## 2013-03-17 DIAGNOSIS — K2990 Gastroduodenitis, unspecified, without bleeding: Secondary | ICD-10-CM

## 2013-03-17 DIAGNOSIS — R109 Unspecified abdominal pain: Secondary | ICD-10-CM

## 2013-03-17 DIAGNOSIS — K229 Disease of esophagus, unspecified: Secondary | ICD-10-CM

## 2013-03-17 DIAGNOSIS — E669 Obesity, unspecified: Secondary | ICD-10-CM | POA: Diagnosis not present

## 2013-03-17 DIAGNOSIS — I10 Essential (primary) hypertension: Secondary | ICD-10-CM | POA: Diagnosis not present

## 2013-03-17 DIAGNOSIS — K2100 Gastro-esophageal reflux disease with esophagitis, without bleeding: Secondary | ICD-10-CM | POA: Diagnosis not present

## 2013-03-17 DIAGNOSIS — K2970 Gastritis, unspecified, without bleeding: Secondary | ICD-10-CM | POA: Diagnosis not present

## 2013-03-17 MED ORDER — PANTOPRAZOLE SODIUM 40 MG PO TBEC: DELAYED_RELEASE_TABLET | ORAL | Status: DC

## 2013-03-17 MED ORDER — SODIUM CHLORIDE 0.9 % IV SOLN: 500.0000 mL | INTRAVENOUS | Status: DC

## 2013-03-17 NOTE — Patient Instructions (Signed)
YOU HAD AN ENDOSCOPIC PROCEDURE TODAY AT THE [facility]: Refer to the procedure report that was given to you for any specific questions about what was found during the examination.  If the procedure report does not answer your questions, please call your gastroenterologist to clarify.  If you requested that your care partner not be given the details of your procedure findings, then the procedure report has been included in a sealed envelope for you to review at your convenience later.  YOU SHOULD EXPECT: Some feelings of bloating in the abdomen. Passage of more gas than usual.  Walking can help get rid of the air that was put into your GI tract during the procedure and reduce the bloating. If you had a lower endoscopy (such as a colonoscopy or flexible sigmoidoscopy) you may notice spotting of blood in your stool or on the toilet paper. If you underwent a bowel prep for your procedure, then you may not have a normal bowel movement for a few days.  DIET: Your first meal following the procedure should be a light meal and then it is ok to progress to your normal diet.  A half-sandwich or bowl of soup is an example of a good first meal.  Heavy or fried foods are harder to digest and may make you feel nauseous or bloated.  Likewise meals heavy in dairy and vegetables can cause extra gas to form and this can also increase the bloating.  Drink plenty of fluids but you should avoid alcoholic beverages for 24 hours.  ACTIVITY: Your care partner should take you home directly after the procedure.  You should plan to take it easy, moving slowly for the rest of the day.  You can resume normal activity the day after the procedure however you should NOT DRIVE or use heavy machinery for 24 hours (because of the sedation medicines used during the test).    SYMPTOMS TO REPORT IMMEDIATELY: A gastroenterologist can be reached at any hour.  During normal business hours, 8:30 AM to 5:00 PM Monday through Friday,  call [phone].  After hours and on weekends, please call the GI answering service at [phone] who will take a message and have the physician on call contact you.    Following upper endoscopy (EGD)  Vomiting of blood or coffee ground material  New chest pain or pain under the shoulder blades  Painful or persistently difficult swallowing  New shortness of breath  Fever of 100F or higher  Black, tarry-looking stools  FOLLOW UP: If any biopsies were taken you will be contacted by phone or by letter within the next 1-3 weeks.  Call your gastroenterologist if you have not heard about the biopsies in 3 weeks.  Our staff will call the home number listed on your records the next business day following your procedure to check on you and address any questions or concerns that you may have at that time regarding the information given to you following your procedure. This is a courtesy call and so if there is no answer at the home number and we have not heard from you through the emergency physician on call, we will assume that you have returned to your regular daily activities without incident.  SIGNATURES/CONFIDENTIALITY: You and/or your care partner have signed paperwork which will be entered into your electronic medical record.  These signatures attest to the fact that that the information above on your After Visit Summary has been reviewed and is understood.  Full   responsibility of the confidentiality of this discharge information lies with you and/or your care-partner.  Resume medications. Information given on gastritis with discharge instructions. Call office to schedule 4-6 week follow up appointment.

## 2013-03-17 NOTE — Progress Notes (Signed)
Report to pacu rn, vss, bbs=clear 

## 2013-03-17 NOTE — Progress Notes (Signed)
Called to room to assist during endoscopic procedure.  Patient ID and intended procedure confirmed with present staff. Received instructions for my participation in the procedure from the performing physician.  

## 2013-03-17 NOTE — Op Note (Signed)
[name] [place], [address]   ENDOSCOPY PROCEDURE REPORT  PATIENT: Gabriella Walker, Gabriella Walker  MR#: [id number] BIRTHDATE: [birth year] , 73  yrs. old GENDER: Female ENDOSCOPIST: Ladene Artist, MD, [facility] REFERRED BY:  Janalyn Rouse, M.D. PROCEDURE DATE:  03/17/2013 PROCEDURE:  EGD w/ biopsy ASA CLASS:     Class II INDICATIONS:  abdominal pain in the upper right quadrant. abdominal pain in upper left quadrant. MEDICATIONS: MAC sedation, administered by CRNA and propofol (Diprivan) 150mg  IV TOPICAL ANESTHETIC: Cetacaine Spray DESCRIPTION OF PROCEDURE: After the risks benefits and alternatives of the procedure were thoroughly explained, informed consent was obtained.  The LB [id number] and LB [id number] endoscope was introduced through the mouth and advanced to the second portion of the duodenum. Without limitations.  The instrument was slowly withdrawn as the mucosa was fully examined.  ESOPHAGUS: A variable Z-line was observed 39 cm from the incisors. Multiple biopsies were performed.   The esophagus was otherwise normal. STOMACH: Mild non-erosive gastritis was found in the gastric body and gastric antrum.  Multiple biopsies were performed.   The stomach otherwise appeared normal. DUODENUM: The duodenal mucosa showed no abnormalities in the bulb and second portion of the duodenum.  Retroflexed views revealed no abnormalities.   The scope was then withdrawn from the patient and the procedure completed.  COMPLICATIONS: There were no complications.  ENDOSCOPIC IMPRESSION: 1.   Variable Z-line at 39 cm from the incisors; multiple biopsies 2.   Non-erosive gastritis in the gastric body and gastric antrum; multiple biopsies  RECOMMENDATIONS: 1.  Anti-reflux regimen 2.  PPI qam: pantoprazole 40 mg po qam, 1 year of refills 3.  Await pathology results 4.  Office appt in 4-6 weeks   eSigned:  Ladene Artist, MD, [facility] 03/17/2013 10:46  AM

## 2013-03-18 ENCOUNTER — Telehealth: Payer: Self-pay

## 2013-03-18 NOTE — Telephone Encounter (Signed)
  Follow up Call-  Call back number 03/17/2013 12/10/2012  Post procedure Call Back phone  # [phone]  Permission to leave phone message Yes Yes     Patient questions:  Do you have a fever, pain , or abdominal swelling? no Pain Score  0 *  Have you tolerated food without any problems? yes  Have you been able to return to your normal activities? yes  Do you have any questions about your discharge instructions: Diet   no Medications  no Follow up visit  no  Do you have questions or concerns about your Care? no  Actions: * If pain score is 4 or above: No action needed, pain <4.   No problems per the pt. Maw

## 2013-03-20 ENCOUNTER — Other Ambulatory Visit: Payer: Self-pay

## 2013-03-20 DIAGNOSIS — Z1231 Encounter for screening mammogram for malignant neoplasm of breast: Secondary | ICD-10-CM

## 2013-03-23 ENCOUNTER — Encounter: Payer: Self-pay | Admitting: Gastroenterology

## 2013-03-23 DIAGNOSIS — H11449 Conjunctival cysts, unspecified eye: Secondary | ICD-10-CM | POA: Diagnosis not present

## 2013-03-23 DIAGNOSIS — H2510 Age-related nuclear cataract, unspecified eye: Secondary | ICD-10-CM | POA: Diagnosis not present

## 2013-04-08 ENCOUNTER — Ambulatory Visit
Admission: RE | Admit: 2013-04-08 | Discharge: 2013-04-08 | Disposition: A | Discharge status: Home / Self Care | Payer: Medicare Other | Source: Ambulatory Visit

## 2013-04-08 DIAGNOSIS — Z1231 Encounter for screening mammogram for malignant neoplasm of breast: Secondary | ICD-10-CM | POA: Diagnosis not present

## 2013-04-09 ENCOUNTER — Other Ambulatory Visit: Payer: Self-pay | Admitting: Internal Medicine

## 2013-04-09 DIAGNOSIS — R928 Other abnormal and inconclusive findings on diagnostic imaging of breast: Secondary | ICD-10-CM

## 2013-04-22 ENCOUNTER — Ambulatory Visit (INDEPENDENT_AMBULATORY_CARE_PROVIDER_SITE_OTHER): Payer: Medicare Other | Admitting: Gastroenterology

## 2013-04-22 ENCOUNTER — Other Ambulatory Visit: Payer: Self-pay | Admitting: Internal Medicine

## 2013-04-22 ENCOUNTER — Encounter: Payer: Self-pay | Admitting: Gastroenterology

## 2013-04-22 ENCOUNTER — Ambulatory Visit
Admission: RE | Admit: 2013-04-22 | Discharge: 2013-04-22 | Disposition: A | Discharge status: Home / Self Care | Payer: Medicare Other | Source: Ambulatory Visit | Attending: Internal Medicine | Admitting: Internal Medicine

## 2013-04-22 ENCOUNTER — Encounter (INDEPENDENT_AMBULATORY_CARE_PROVIDER_SITE_OTHER): Payer: Self-pay

## 2013-04-22 VITALS — BP 120/60 | HR 60 | Ht 63.0 in | Wt 201.0 lb

## 2013-04-22 DIAGNOSIS — N6310 Unspecified lump in the right breast, unspecified quadrant: Secondary | ICD-10-CM

## 2013-04-22 DIAGNOSIS — N6489 Other specified disorders of breast: Secondary | ICD-10-CM | POA: Diagnosis not present

## 2013-04-22 DIAGNOSIS — C50919 Malignant neoplasm of unspecified site of unspecified female breast: Secondary | ICD-10-CM | POA: Diagnosis not present

## 2013-04-22 DIAGNOSIS — R079 Chest pain, unspecified: Secondary | ICD-10-CM | POA: Diagnosis not present

## 2013-04-22 DIAGNOSIS — K219 Gastro-esophageal reflux disease without esophagitis: Secondary | ICD-10-CM | POA: Diagnosis not present

## 2013-04-22 DIAGNOSIS — R928 Other abnormal and inconclusive findings on diagnostic imaging of breast: Secondary | ICD-10-CM

## 2013-04-22 DIAGNOSIS — D0590 Unspecified type of carcinoma in situ of unspecified breast: Secondary | ICD-10-CM | POA: Diagnosis not present

## 2013-04-22 HISTORY — PX: BREAST BIOPSY: SHX20

## 2013-04-22 NOTE — Patient Instructions (Addendum)
You can take Advil 2-3 tablets by mouth three times a day as needed for costal margin pain.  Continue Protonix daily.   Ongoing follow with your Primary Care Physician.   Thank you for choosing me and [facility].  Pricilla Riffle. Dagoberto Ligas., MD., Mar[facility] Regal  cc: Janalyn Rouse, MD

## 2013-04-22 NOTE — Progress Notes (Signed)
    History of Present Illness: This is a 73 year old female who relates frequent possible large area pain. Her pain is often brought on by meals it occurs at other times. Her recent upper endoscopy was completely normal. She states she has had less symptoms since starting pantoprazole.  Current Medications, Allergies, Past Medical History, Past Surgical History, Family History and Social History were reviewed in [employer] record.  Physical Exam: General: Well developed , well nourished, no acute distress Head: Normocephalic and atraumatic Eyes:  sclerae anicteric, EOMI Ears: Normal auditory acuity Mouth: No deformity or lesions Lungs: Clear throughout to auscultation, lower anterior chest wall tenderness Heart: Regular rate and rhythm; no murmurs, rubs or bruits Abdomen: Soft, non tender and non distended. No masses, hepatosplenomegaly or hernias noted. Normal Bowel sounds Musculoskeletal: Symmetrical with no gross deformities  Pulses:  Normal pulses noted Extremities: No clubbing, cyanosis, edema or deformities noted Neurological: Alert oriented x 4, grossly nonfocal Psychological:  Alert and cooperative. Normal mood and affect  Assessment and Recommendations:  1. Costal margin pain-appears to be musculoskeletal and GI related. Continue PPI daily. Ibuprofen 4-600 mg 3 times a day when necessary. No further evaluation at this time.

## 2013-04-23 ENCOUNTER — Other Ambulatory Visit: Payer: Self-pay | Admitting: Internal Medicine

## 2013-04-23 DIAGNOSIS — C50919 Malignant neoplasm of unspecified site of unspecified female breast: Secondary | ICD-10-CM

## 2013-04-27 ENCOUNTER — Telehealth: Payer: Self-pay

## 2013-04-27 NOTE — Telephone Encounter (Signed)
Contacted the patient to answer questions regarding new diagnosis. She requested that her appointment be changed to Dr. Excell Seltzer. I assisted her with this and gave her new appointment time.

## 2013-04-28 ENCOUNTER — Ambulatory Visit
Admission: RE | Admit: 2013-04-28 | Discharge: 2013-04-28 | Disposition: A | Discharge status: Home / Self Care | Payer: Medicare Other | Source: Ambulatory Visit | Attending: Internal Medicine | Admitting: Internal Medicine

## 2013-04-28 DIAGNOSIS — C50919 Malignant neoplasm of unspecified site of unspecified female breast: Secondary | ICD-10-CM

## 2013-04-28 MED ORDER — GADOBENATE DIMEGLUMINE 529 MG/ML IV SOLN
18.0000 mL | Freq: Once | INTRAVENOUS | Status: AC | PRN
  Administered 2013-04-28: 18 mL via INTRAVENOUS

## 2013-05-01 ENCOUNTER — Encounter (INDEPENDENT_AMBULATORY_CARE_PROVIDER_SITE_OTHER): Payer: Medicare Other | Admitting: Surgery

## 2013-05-18 ENCOUNTER — Other Ambulatory Visit (INDEPENDENT_AMBULATORY_CARE_PROVIDER_SITE_OTHER): Payer: Self-pay | Admitting: General Surgery

## 2013-05-18 ENCOUNTER — Ambulatory Visit (INDEPENDENT_AMBULATORY_CARE_PROVIDER_SITE_OTHER): Payer: Medicare Other | Admitting: General Surgery

## 2013-05-18 ENCOUNTER — Encounter (INDEPENDENT_AMBULATORY_CARE_PROVIDER_SITE_OTHER): Payer: Self-pay | Admitting: General Surgery

## 2013-05-18 VITALS — BP 110/70 | HR 62 | Temp 98.8°F | Resp 14 | Ht 63.0 in | Wt 199.6 lb

## 2013-05-18 DIAGNOSIS — C50919 Malignant neoplasm of unspecified site of unspecified female breast: Secondary | ICD-10-CM | POA: Diagnosis not present

## 2013-05-18 DIAGNOSIS — C50911 Malignant neoplasm of unspecified site of right female breast: Secondary | ICD-10-CM | POA: Insufficient documentation

## 2013-05-18 NOTE — Progress Notes (Signed)
Chief Complaint: New diagnosis of breast cancer  History:    Gabriella Walker is a 73 y.o. postmenopausal female referred by Dr. Hu  for evaluation of recently diagnosed carcinoma of the right breast. She recently presented for a screening mamogram revealing a subtle area of architectural distortion.  Subsequent imaging included diagnostic mamogram confirming architectural distortion and ultrasound showing a 5 mm hypoechoic mass.   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 She is seen now in the office for initial treatment planning.  She has experienced no breast symptoms, specifically lump or pain or nipple discharge or skin changes.  She does have a personal history of questionable thickening on exam in the right breast in previous years but negative imaging.  Subsequent breast MRI has been performed revealing a 1.3 cm mass with clip artifact at the 12:00 position in the right breast with no other suspicious areas in either breast and normal-appearing lymph nodes.  Findings at that time were the following:  Tumor size: 1.3 cm  Tumor grade: 1  Estrogen Receptor: positive Progesterone Receptor: positive  Her-2 neu: negative  Lymph node status: negative Neurovascular invasion: no Lymphatic invasion: no  Past Medical History  Diagnosis Date  . HTN (hypertension)   . Hyperlipidemia   . Adenomatous colon polyp   . GERD (gastroesophageal reflux disease)     Past Surgical History  Procedure Laterality Date  . Cardiac catheterization  2008  . Appendectomy  1956  . Ectopic pregnancy surgery  1968  . Tubal ligation  1976    Current Outpatient Prescriptions  Medication Sig Dispense Refill  . Calcium Carb-Cholecalciferol (CALCIUM + D3 PO) Take 2 tablets by mouth daily.      . cholecalciferol (VITAMIN D) 1000 UNITS tablet Take 1,000 Units by mouth daily.      . fexofenadine (ALLEGRA) 180 MG tablet Take 180 mg  by mouth daily.      . fluticasone (FLONASE) 50 MCG/ACT nasal spray as directed.      . furosemide (LASIX) 40 MG tablet prn      . metoprolol (LOPRESSOR) 50 MG tablet Take 1 tablet by mouth 2 (two) times daily.      . pantoprazole (PROTONIX) 40 MG tablet Take 1 tablet 30 minutes before breakfast  30 tablet  11  . Probiotic Product (PROBIOTIC DAILY PO) Take by mouth daily.      . simvastatin (ZOCOR) 40 MG tablet Take 1 tablet by mouth daily.      . valsartan-hydrochlorothiazide (DIOVAN-HCT) 320-25 MG per tablet Take 1 tablet by mouth daily.       No current facility-administered medications for this visit.    Family History  Problem Relation Age of Onset  . Coronary artery disease Father     MI at age 60  . Heart disease Father   . Heart disease Mother   . Hypertension Mother   . Colon cancer Neg Hx   . Esophageal cancer Neg Hx   . Rectal cancer Neg Hx   . Stomach cancer Neg Hx     History   Social History  . Marital Status: Widowed    Spouse Name: N/A    Number of Children: 2  . Years of Education: N/A   Social History Main Topics  . Smoking status: Never Smoker   . Smokeless tobacco: Never Used  . Alcohol Use: No  . Drug Use: No  . Sexual Activity: None     Other Topics Concern  . None   Social History Narrative  . None     Review of Systems Constitutional: negative Respiratory: positive for chronic cough and some shortness of breath on exertion Cardiovascular: positive for occasional palpitations and tachycardia. Has history of negative catheterization per patient Gastrointestinal: positive for diarrhea Musculoskeletal:positive for arthralgias     Objective:  BP 110/70  Pulse 62  Temp(Src) 98.8 F (37.1 C)  Resp 14  Ht 5' 3" (1.6 m)  Wt 199 lb 9.6 oz (90.538 kg)  BMI 35.37 kg/m2  General: Alert, well-developed Caucasian female, in no distress Skin: Warm and dry without rash or infection. HEENT: No palpable masses or thyromegaly. Sclera nonicteric.  Pupils equal round and reactive. Oropharynx clear. Breasts: in the right breast upper outer quadrant just off the areolar border is a small approximately 1 cm area of firmness or thickening possibly related to biopsy change versus tumor. No other double masses in either breast. No palpable axillary adenopathy Lymph nodes: No cervical, supraclavicular, or inguinal nodes palpable. Lungs: Breath sounds clear and equal without increased work of breathing Cardiovascular: Regular rate and rhythm without murmur. No JVD or edema. Abdomen: Nondistended. Soft and nontender. No masses palpable. No organomegaly. No palpable hernias. Extremities: No edema or joint swelling or deformity. No chronic venous stasis changes. Neurologic: Alert and fully oriented. Gait normal.   Laboratory data:  CBC:  No results found for this basename: WBC, RBC, HGB, HCT, PLT  ]  [facility] Labs:  No results found for this basename: GLUF, NA, K, CL, CO2, BUN, CREATININE, CALCIUM, PROT, ALB, BILITOT, BILIDIR, ALKPHOS, AST, ALT     Assessment  73 y.o. female with a new diagnosis of cancer of the the right breast upper outer quadrant.  Clinical IA, estrogen receptor positive, progesterone receptor positive and Her2/Neu protein/oncogene negative. I discussed with the patient and family members present today initial surgical treatment options. We discussed options of breast conservation with lumpectomy or total mastectomy and sentinal lymph node biopsy/dissection. Options for reconstruction were discussed. After discussion they have elected to proceed with right partial mastectomy.  We discussed the indications and nature of the procedure, and expected recovery, in detail. Surgical risks including anesthetic complications, cardiorespiratory complications, bleeding, infection, wound healing complications, blood clots, lymphedema, local and distant recurrence and possible need for further surgery based on the final pathology was discussed and  understood.  Chemotherapy, hormonal therapy and radiation therapy have been discussed. They have been provided with literature regarding the treatment of breast cancer.  All questions were answered. They understand and agree to proceed and we will go ahead with scheduling.  Plan Needle localized right partial mastectomy and right axillary sentinel lymph node biopsy under general anesthesia  Pietra Zuluaga T Keller Mikels MD, FACS  05/18/2013, 3:05 PM 

## 2013-05-22 ENCOUNTER — Encounter (HOSPITAL_BASED_OUTPATIENT_CLINIC_OR_DEPARTMENT_OTHER): Payer: Self-pay | Admitting: *Deleted

## 2013-05-22 NOTE — Progress Notes (Signed)
05/22/13 1323  OBSTRUCTIVE SLEEP APNEA  Have you ever been diagnosed with sleep apnea through a sleep study? No  Do you snore loudly (loud enough to be heard through closed doors)?  0  Do you often feel tired, fatigued, or sleepy during the daytime? 0  Has anyone observed you stop breathing during your sleep? 0  Do you have, or are you being treated for high blood pressure? 1  BMI more than 35 kg/m2? 1  Age over 74 years old? 1  Neck circumference greater than 40 cm/16 inches? 1  Gender: 0  Obstructive Sleep Apnea Score 4

## 2013-05-22 NOTE — Progress Notes (Signed)
To come in for bmet-had a cath done 2008 dr Ivor Reining him 10/14-[id number] ggod-no testing needed

## 2013-05-26 ENCOUNTER — Encounter (HOSPITAL_BASED_OUTPATIENT_CLINIC_OR_DEPARTMENT_OTHER)
Admission: RE | Admit: 2013-05-26 | Discharge: 2013-05-26 | Disposition: A | Discharge status: Home / Self Care | Payer: Medicare Other | Source: Ambulatory Visit | Attending: General Surgery | Admitting: General Surgery

## 2013-05-26 DIAGNOSIS — Z79899 Other long term (current) drug therapy: Secondary | ICD-10-CM | POA: Diagnosis not present

## 2013-05-26 DIAGNOSIS — Z86010 Personal history of colonic polyps: Secondary | ICD-10-CM | POA: Diagnosis not present

## 2013-05-26 DIAGNOSIS — I10 Essential (primary) hypertension: Secondary | ICD-10-CM | POA: Diagnosis not present

## 2013-05-26 DIAGNOSIS — K219 Gastro-esophageal reflux disease without esophagitis: Secondary | ICD-10-CM | POA: Diagnosis not present

## 2013-05-26 DIAGNOSIS — E785 Hyperlipidemia, unspecified: Secondary | ICD-10-CM | POA: Diagnosis not present

## 2013-05-26 DIAGNOSIS — C50919 Malignant neoplasm of unspecified site of unspecified female breast: Secondary | ICD-10-CM | POA: Diagnosis not present

## 2013-05-26 LAB — BASIC METABOLIC PANEL
BUN: 18 mg/dL (ref 6–23)
CO2: 27 mEq/L (ref 19–32)
Calcium: 9.5 mg/dL (ref 8.4–10.5)
Chloride: 99 mEq/L (ref 96–112)
Creatinine, Ser: 0.77 mg/dL (ref 0.50–1.10)
GFR calc Af Amer: >90 mL/min (ref >90)
GFR, EST NON AFRICAN AMERICAN: 81 mL/min — AB (ref >90)
GLUCOSE: 97 mg/dL (ref 70–99)
Potassium: 4.6 mEq/L (ref 3.7–5.3)
Sodium: 138 mEq/L (ref 137–147)

## 2013-05-29 ENCOUNTER — Encounter (HOSPITAL_COMMUNITY)
Admission: RE | Admit: 2013-05-29 | Discharge: 2013-05-29 | Disposition: A | Discharge status: Home / Self Care | Payer: Medicare Other | Source: Ambulatory Visit | Attending: General Surgery | Admitting: General Surgery

## 2013-05-29 ENCOUNTER — Encounter (HOSPITAL_BASED_OUTPATIENT_CLINIC_OR_DEPARTMENT_OTHER)
Admission: RE | Disposition: A | Discharge status: Home / Self Care | Payer: Self-pay | Source: Ambulatory Visit | Attending: General Surgery

## 2013-05-29 ENCOUNTER — Ambulatory Visit (HOSPITAL_BASED_OUTPATIENT_CLINIC_OR_DEPARTMENT_OTHER)
Admission: RE | Admit: 2013-05-29 | Discharge: 2013-05-29 | Disposition: A | Discharge status: Home / Self Care | Payer: Medicare Other | Source: Ambulatory Visit | Attending: General Surgery | Admitting: General Surgery

## 2013-05-29 ENCOUNTER — Ambulatory Visit
Admission: RE | Admit: 2013-05-29 | Discharge: 2013-05-29 | Disposition: A | Discharge status: Home / Self Care | Payer: Medicare Other | Source: Ambulatory Visit | Attending: General Surgery | Admitting: General Surgery

## 2013-05-29 ENCOUNTER — Encounter (HOSPITAL_BASED_OUTPATIENT_CLINIC_OR_DEPARTMENT_OTHER): Payer: Self-pay

## 2013-05-29 ENCOUNTER — Ambulatory Visit (HOSPITAL_BASED_OUTPATIENT_CLINIC_OR_DEPARTMENT_OTHER): Payer: Medicare Other | Admitting: Anesthesiology

## 2013-05-29 ENCOUNTER — Encounter (HOSPITAL_BASED_OUTPATIENT_CLINIC_OR_DEPARTMENT_OTHER): Payer: Medicare Other | Admitting: Anesthesiology

## 2013-05-29 DIAGNOSIS — Z860100 Personal history of colon polyps, unspecified: Secondary | ICD-10-CM | POA: Insufficient documentation

## 2013-05-29 DIAGNOSIS — G8918 Other acute postprocedural pain: Secondary | ICD-10-CM | POA: Diagnosis not present

## 2013-05-29 DIAGNOSIS — E785 Hyperlipidemia, unspecified: Secondary | ICD-10-CM | POA: Diagnosis not present

## 2013-05-29 DIAGNOSIS — D360 Benign neoplasm of lymph nodes: Secondary | ICD-10-CM | POA: Diagnosis not present

## 2013-05-29 DIAGNOSIS — N6089 Other benign mammary dysplasias of unspecified breast: Secondary | ICD-10-CM | POA: Diagnosis not present

## 2013-05-29 DIAGNOSIS — I10 Essential (primary) hypertension: Secondary | ICD-10-CM | POA: Diagnosis not present

## 2013-05-29 DIAGNOSIS — K219 Gastro-esophageal reflux disease without esophagitis: Secondary | ICD-10-CM | POA: Insufficient documentation

## 2013-05-29 DIAGNOSIS — C50911 Malignant neoplasm of unspecified site of right female breast: Secondary | ICD-10-CM

## 2013-05-29 DIAGNOSIS — N644 Mastodynia: Secondary | ICD-10-CM | POA: Diagnosis not present

## 2013-05-29 DIAGNOSIS — D0590 Unspecified type of carcinoma in situ of unspecified breast: Secondary | ICD-10-CM | POA: Diagnosis not present

## 2013-05-29 DIAGNOSIS — C50919 Malignant neoplasm of unspecified site of unspecified female breast: Secondary | ICD-10-CM | POA: Insufficient documentation

## 2013-05-29 DIAGNOSIS — Z79899 Other long term (current) drug therapy: Secondary | ICD-10-CM | POA: Insufficient documentation

## 2013-05-29 HISTORY — PX: BREAST LUMPECTOMY: SHX2

## 2013-05-29 HISTORY — DX: Presence of spectacles and contact lenses: Z97.3

## 2013-05-29 HISTORY — PX: BREAST LUMPECTOMY WITH NEEDLE LOCALIZATION AND AXILLARY SENTINEL LYMPH NODE BX: SHX5760

## 2013-05-29 LAB — POCT HEMOGLOBIN-HEMACUE: Hemoglobin: 14.2 g/dL (ref 12.0–15.0)

## 2013-05-29 SURGERY — BREAST LUMPECTOMY WITH NEEDLE LOCALIZATION AND AXILLARY SENTINEL LYMPH NODE BX
Anesthesia: Regional | Site: Breast | Laterality: Right

## 2013-05-29 MED ORDER — HYDROMORPHONE HCL PF 1 MG/ML IJ SOLN
INTRAMUSCULAR | Status: AC
  Filled 2013-05-29: qty 1

## 2013-05-29 MED ORDER — ONDANSETRON HCL 4 MG/2ML IJ SOLN
INTRAMUSCULAR | Status: DC | PRN
  Administered 2013-05-29: 4 mg via INTRAVENOUS

## 2013-05-29 MED ORDER — METHYLENE BLUE 1 % INJ SOLN
INTRAMUSCULAR | Status: DC | PRN
  Administered 2013-05-29: 2 mL via INTRADERMAL

## 2013-05-29 MED ORDER — HYDROMORPHONE HCL PF 1 MG/ML IJ SOLN
0.2500 mg | INTRAMUSCULAR | Status: DC | PRN
  Administered 2013-05-29 (×3): 0.25 mg via INTRAVENOUS

## 2013-05-29 MED ORDER — MIDAZOLAM HCL 2 MG/2ML IJ SOLN
1.0000 mg | INTRAMUSCULAR | Status: DC | PRN
  Administered 2013-05-29: 2 mg via INTRAVENOUS

## 2013-05-29 MED ORDER — CHLORHEXIDINE GLUCONATE 4 % EX LIQD: 1.0000 "application " | Freq: Once | CUTANEOUS | Status: DC

## 2013-05-29 MED ORDER — HYDROCODONE-ACETAMINOPHEN 5-325 MG PO TABS: 1.0000 | ORAL_TABLET | ORAL | Status: DC | PRN

## 2013-05-29 MED ORDER — SODIUM CHLORIDE 0.9 % IJ SOLN
INTRAMUSCULAR | Status: DC | PRN
  Administered 2013-05-29: 3 mL

## 2013-05-29 MED ORDER — MIDAZOLAM HCL 2 MG/2ML IJ SOLN
INTRAMUSCULAR | Status: AC
  Filled 2013-05-29: qty 2

## 2013-05-29 MED ORDER — EPHEDRINE SULFATE 50 MG/ML IJ SOLN
INTRAMUSCULAR | Status: DC | PRN
  Administered 2013-05-29 (×2): 10 mg via INTRAVENOUS

## 2013-05-29 MED ORDER — METHYLENE BLUE 1 % INJ SOLN
INTRAMUSCULAR | Status: AC
  Filled 2013-05-29: qty 10

## 2013-05-29 MED ORDER — CEFAZOLIN SODIUM-DEXTROSE 2-3 GM-% IV SOLR
INTRAVENOUS | Status: AC
  Filled 2013-05-29: qty 50

## 2013-05-29 MED ORDER — PROPOFOL 10 MG/ML IV BOLUS
INTRAVENOUS | Status: DC | PRN
  Administered 2013-05-29: 100 mg via INTRAVENOUS

## 2013-05-29 MED ORDER — PROPOFOL 10 MG/ML IV BOLUS
INTRAVENOUS | Status: AC
  Filled 2013-05-29: qty 20

## 2013-05-29 MED ORDER — TECHNETIUM TC 99M SULFUR COLLOID FILTERED
1.0000 | Freq: Once | INTRAVENOUS | Status: AC | PRN
  Administered 2013-05-29: 1 via INTRADERMAL

## 2013-05-29 MED ORDER — DEXAMETHASONE SODIUM PHOSPHATE 4 MG/ML IJ SOLN
INTRAMUSCULAR | Status: DC | PRN
  Administered 2013-05-29: 10 mg via INTRAVENOUS

## 2013-05-29 MED ORDER — BUPIVACAINE-EPINEPHRINE (PF) 0.5% -1:200000 IJ SOLN
INTRAMUSCULAR | Status: DC | PRN
  Administered 2013-05-29: 30 mL

## 2013-05-29 MED ORDER — BUPIVACAINE-EPINEPHRINE (PF) 0.5% -1:200000 IJ SOLN
INTRAMUSCULAR | Status: AC
  Filled 2013-05-29: qty 30

## 2013-05-29 MED ORDER — FENTANYL CITRATE 0.05 MG/ML IJ SOLN
INTRAMUSCULAR | Status: AC
  Filled 2013-05-29: qty 2

## 2013-05-29 MED ORDER — FENTANYL CITRATE 0.05 MG/ML IJ SOLN
INTRAMUSCULAR | Status: DC | PRN
  Administered 2013-05-29: 50 ug via INTRAVENOUS

## 2013-05-29 MED ORDER — FENTANYL CITRATE 0.05 MG/ML IJ SOLN
50.0000 ug | INTRAMUSCULAR | Status: DC | PRN
  Administered 2013-05-29: 100 ug via INTRAVENOUS

## 2013-05-29 MED ORDER — LACTATED RINGERS IV SOLN
INTRAVENOUS | Status: DC
  Administered 2013-05-29: 10 mL/h via INTRAVENOUS
  Administered 2013-05-29 (×2): via INTRAVENOUS

## 2013-05-29 MED ORDER — FENTANYL CITRATE 0.05 MG/ML IJ SOLN
INTRAMUSCULAR | Status: AC
  Filled 2013-05-29: qty 4

## 2013-05-29 MED ORDER — CEFAZOLIN SODIUM-DEXTROSE 2-3 GM-% IV SOLR
2.0000 g | INTRAVENOUS | Status: AC
  Administered 2013-05-29: 2 g via INTRAVENOUS

## 2013-05-29 MED ORDER — SODIUM CHLORIDE 0.9 % IJ SOLN
INTRAMUSCULAR | Status: AC
  Filled 2013-05-29: qty 10

## 2013-05-29 MED ORDER — LIDOCAINE HCL (CARDIAC) 20 MG/ML IV SOLN
INTRAVENOUS | Status: DC | PRN
  Administered 2013-05-29: 30 mg via INTRAVENOUS

## 2013-05-29 SURGICAL SUPPLY — 61 items
ADH SKN CLS APL DERMABOND .7 (GAUZE/BANDAGES/DRESSINGS) ×1
APPLIER CLIP 11 MED OPEN (CLIP)
APR CLP MED 11 20 MLT OPN (CLIP)
BINDER BREAST XLRG (GAUZE/BANDAGES/DRESSINGS) ×1 IMPLANT
BLADE 10 SAFETY STRL DISP (BLADE) ×1 IMPLANT
BLADE 15 SAFETY STRL DISP (BLADE) ×2 IMPLANT
CANISTER SUCT 1200ML W/VALVE (MISCELLANEOUS) ×2 IMPLANT
CHLORAPREP W/TINT 26ML (MISCELLANEOUS) ×2 IMPLANT
CLIP APPLIE 11 MED OPEN (CLIP) IMPLANT
CLIP TI WIDE RED SMALL 6 (CLIP) ×2 IMPLANT
COVER MAYO STAND STRL (DRAPES) ×2 IMPLANT
COVER PROBE W GEL 5X96 (DRAPES) ×2 IMPLANT
COVER TABLE BACK 60X90 (DRAPES) ×2 IMPLANT
DECANTER SPIKE VIAL GLASS SM (MISCELLANEOUS) IMPLANT
DERMABOND ADVANCED (GAUZE/BANDAGES/DRESSINGS) ×1
DERMABOND ADVANCED .7 DNX12 (GAUZE/BANDAGES/DRESSINGS) ×1 IMPLANT
DEVICE DUBIN W/COMP PLATE 8390 (MISCELLANEOUS) ×2 IMPLANT
DRAIN CHANNEL 19F RND (DRAIN) IMPLANT
DRAIN HEMOVAC 1/8 X 5 (WOUND CARE) IMPLANT
DRAPE LAPAROSCOPIC ABDOMINAL (DRAPES) ×2 IMPLANT
DRAPE UTILITY XL STRL (DRAPES) ×2 IMPLANT
ELECT COATED BLADE 2.86 ST (ELECTRODE) ×2 IMPLANT
ELECT REM PT RETURN 9FT ADLT (ELECTROSURGICAL) ×2
ELECTRODE REM PT RTRN 9FT ADLT (ELECTROSURGICAL) ×1 IMPLANT
EVACUATOR SILICONE 100CC (DRAIN) IMPLANT
GLOVE BIOGEL PI IND STRL 7.0 (GLOVE) IMPLANT
GLOVE BIOGEL PI IND STRL 8 (GLOVE) ×1 IMPLANT
GLOVE BIOGEL PI INDICATOR 7.0 (GLOVE) ×1
GLOVE BIOGEL PI INDICATOR 8 (GLOVE) ×1
GLOVE ECLIPSE 7.0 STRL STRAW (GLOVE) ×1 IMPLANT
GLOVE SS BIOGEL STRL SZ 7.5 (GLOVE) ×1 IMPLANT
GLOVE SUPERSENSE BIOGEL SZ 7.5 (GLOVE) ×2
GOWN STRL REUS W/ TWL LRG LVL3 (GOWN DISPOSABLE) ×1 IMPLANT
GOWN STRL REUS W/ TWL XL LVL3 (GOWN DISPOSABLE) ×1 IMPLANT
GOWN STRL REUS W/TWL LRG LVL3 (GOWN DISPOSABLE) ×2
GOWN STRL REUS W/TWL XL LVL3 (GOWN DISPOSABLE) ×2
KIT MARKER MARGIN INK (KITS) ×2 IMPLANT
NDL HYPO 25X1 1.5 SAFETY (NEEDLE) ×1 IMPLANT
NDL SAFETY ECLIPSE 18X1.5 (NEEDLE) ×1 IMPLANT
NEEDLE HYPO 18GX1.5 SHARP (NEEDLE) ×2
NEEDLE HYPO 25X1 1.5 SAFETY (NEEDLE) ×2 IMPLANT
NS IRRIG 1000ML POUR BTL (IV SOLUTION) ×2 IMPLANT
PACK BASIN DAY SURGERY FS (CUSTOM PROCEDURE TRAY) ×2 IMPLANT
PAD ALCOHOL SWAB (MISCELLANEOUS) ×2 IMPLANT
PENCIL BUTTON HOLSTER BLD 10FT (ELECTRODE) ×2 IMPLANT
PIN SAFETY STERILE (MISCELLANEOUS) IMPLANT
SLEEVE SCD COMPRESS KNEE MED (MISCELLANEOUS) ×2 IMPLANT
SPONGE LAP 18X18 X RAY DECT (DISPOSABLE) IMPLANT
SPONGE LAP 4X18 X RAY DECT (DISPOSABLE) ×3 IMPLANT
SUT ETHILON 3 0 FSL (SUTURE) IMPLANT
SUT MON AB 4-0 PC3 18 (SUTURE) ×1 IMPLANT
SUT MON AB 5-0 PS2 18 (SUTURE) ×1 IMPLANT
SUT SILK 3 0 SH 30 (SUTURE) IMPLANT
SUT VIC AB 3-0 SH 27 (SUTURE)
SUT VIC AB 3-0 SH 27X BRD (SUTURE) IMPLANT
SUT VICRYL 3-0 CR8 SH (SUTURE) ×1 IMPLANT
SYR CONTROL 10ML LL (SYRINGE) ×2 IMPLANT
TOWEL OR 17X24 6PK STRL BLUE (TOWEL DISPOSABLE) ×2 IMPLANT
TOWEL OR NON WOVEN STRL DISP B (DISPOSABLE) ×1 IMPLANT
TUBE CONNECTING 20X1/4 (TUBING) ×2 IMPLANT
YANKAUER SUCT BULB TIP NO VENT (SUCTIONS) ×2 IMPLANT

## 2013-05-29 NOTE — Anesthesia Preprocedure Evaluation (Addendum)
Anesthesia Evaluation  Patient identified by MRN, date of birth, ID band Patient awake    Reviewed: Allergy & Precautions, H&P , NPO status , Patient's Chart, lab work & pertinent test results, reviewed documented beta blocker date and time   Airway Mallampati: II TM Distance: >3 FB Neck ROM: Full    Dental no notable dental hx. (+) Teeth Intact, Dental Advisory Given   Pulmonary neg pulmonary ROS,  breath sounds clear to auscultation  Pulmonary exam normal       Cardiovascular hypertension, On Medications and On Home Beta Blockers Rhythm:Regular Rate:Normal     Neuro/Psych negative neurological ROS  negative psych ROS   GI/Hepatic Neg liver ROS, GERD-  Medicated and Controlled,  Endo/Other    Renal/GU negative Renal ROS  negative genitourinary   Musculoskeletal   Abdominal   Peds  Hematology negative hematology ROS (+)   Anesthesia Other Findings   Reproductive/Obstetrics negative OB ROS                          Anesthesia Physical Anesthesia Plan  ASA: II  Anesthesia Plan: General and Regional   Post-op Pain Management:    Induction: Intravenous  Airway Management Planned: LMA  Additional Equipment:   Intra-op Plan:   Post-operative Plan: Extubation in OR  Informed Consent: I have reviewed the patients History and Physical, chart, labs and discussed the procedure including the risks, benefits and alternatives for the proposed anesthesia with the patient or authorized representative who has indicated his/her understanding and acceptance.   Dental advisory given  Plan Discussed with: CRNA  Anesthesia Plan Comments:         Anesthesia Quick Evaluation

## 2013-05-29 NOTE — Interval H&P Note (Signed)
History and Physical Interval Note:  05/29/2013 11:31 AM  Gabriella Walker  has presented today for surgery, with the diagnosis of right breast cancer   The various methods of treatment have been discussed with the patient and family. After consideration of risks, benefits and other options for treatment, the patient has consented to  Procedure(s): BREAST LUMPECTOMY WITH NEEDLE LOCALIZATION AND AXILLARY SENTINEL LYMPH NODE BX (Right) as a surgical intervention .  The patient's history has been reviewed, patient examined, no change in status, stable for surgery.  I have reviewed the patient's chart and labs.  Questions were answered to the patient's satisfaction.     Darene Lamer Reshanda Lewey

## 2013-05-29 NOTE — H&P (View-Only) (Signed)
Chief Complaint: New diagnosis of breast cancer  History:    Gabriella Walker is a 73 y.o. postmenopausal female referred by Dr. Melanee Spry  for evaluation of recently diagnosed carcinoma of the right breast. She recently presented for a screening mamogram revealing a subtle area of architectural distortion.  Subsequent imaging included diagnostic mamogram confirming architectural distortion and ultrasound showing a 5 mm hypoechoic mass.   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 She is seen now in the office for initial treatment planning.  She has experienced no breast symptoms, specifically lump or pain or nipple discharge or skin changes.  She does have a personal history of questionable thickening on exam in the right breast in previous years but negative imaging.  Subsequent breast MRI has been performed revealing a 1.3 cm mass with clip artifact at the 12:00 position in the right breast with no other suspicious areas in either breast and normal-appearing lymph nodes.  Findings at that time were the following:  Tumor size: 1.3 cm  Tumor grade: 1  Estrogen Receptor: positive Progesterone Receptor: positive  Her-2 neu: negative  Lymph node status: negative Neurovascular invasion: no Lymphatic invasion: no  Past Medical History  Diagnosis Date  . HTN (hypertension)   . Hyperlipidemia   . Adenomatous colon polyp   . GERD (gastroesophageal reflux disease)     Past Surgical History  Procedure Laterality Date  . Cardiac catheterization  2008  . Appendectomy  1956  . Ectopic pregnancy surgery  1968  . Tubal ligation  1976    Current Outpatient Prescriptions  Medication Sig Dispense Refill  . Calcium Carb-Cholecalciferol (CALCIUM + D3 PO) Take 2 tablets by mouth daily.      . cholecalciferol (VITAMIN D) 1000 UNITS tablet Take 1,000 Units by mouth daily.      . fexofenadine (ALLEGRA) 180 MG tablet Take 180 mg  by mouth daily.      . fluticasone (FLONASE) 50 MCG/ACT nasal spray as directed.      . furosemide (LASIX) 40 MG tablet prn      . metoprolol (LOPRESSOR) 50 MG tablet Take 1 tablet by mouth 2 (two) times daily.      . pantoprazole (PROTONIX) 40 MG tablet Take 1 tablet 30 minutes before breakfast  30 tablet  11  . Probiotic Product (PROBIOTIC DAILY PO) Take by mouth daily.      . simvastatin (ZOCOR) 40 MG tablet Take 1 tablet by mouth daily.      . valsartan-hydrochlorothiazide (DIOVAN-HCT) 320-25 MG per tablet Take 1 tablet by mouth daily.       No current facility-administered medications for this visit.    Family History  Problem Relation Age of Onset  . Coronary artery disease Father     MI at age 24  . Heart disease Father   . Heart disease Mother   . Hypertension Mother   . Colon cancer Neg Hx   . Esophageal cancer Neg Hx   . Rectal cancer Neg Hx   . Stomach cancer Neg Hx     History   Social History  . Marital Status: Widowed    Spouse Name: N/A    Number of Children: 2  . Years of Education: N/A   Social History Main Topics  . Smoking status: Never Smoker   . Smokeless tobacco: Never Used  . Alcohol Use: No  . Drug Use: No  . Sexual Activity: None  Other Topics Concern  . None   Social History Narrative  . None     Review of Systems Constitutional: negative Respiratory: positive for chronic cough and some shortness of breath on exertion Cardiovascular: positive for occasional palpitations and tachycardia. Has history of negative catheterization per patient Gastrointestinal: positive for diarrhea Musculoskeletal:positive for arthralgias     Objective:  BP 110/70  Pulse 62  Temp(Src) 98.8 F (37.1 C)  Resp 14  Ht 5' 3" (1.6 m)  Wt 199 lb 9.6 oz (90.538 kg)  BMI 35.37 kg/m2  General: Alert, well-developed Caucasian female, in no distress Skin: Warm and dry without rash or infection. HEENT: No palpable masses or thyromegaly. Sclera nonicteric.  Pupils equal round and reactive. Oropharynx clear. Breasts: in the right breast upper outer quadrant just off the areolar border is a small approximately 1 cm area of firmness or thickening possibly related to biopsy change versus tumor. No other double masses in either breast. No palpable axillary adenopathy Lymph nodes: No cervical, supraclavicular, or inguinal nodes palpable. Lungs: Breath sounds clear and equal without increased work of breathing Cardiovascular: Regular rate and rhythm without murmur. No JVD or edema. Abdomen: Nondistended. Soft and nontender. No masses palpable. No organomegaly. No palpable hernias. Extremities: No edema or joint swelling or deformity. No chronic venous stasis changes. Neurologic: Alert and fully oriented. Gait normal.   Laboratory data:  CBC:  No results found for this basename: WBC, RBC, HGB, HCT, PLT  ]  [facility] Labs:  No results found for this basename: GLUF, NA, K, CL, CO2, BUN, CREATININE, CALCIUM, PROT, ALB, BILITOT, BILIDIR, ALKPHOS, AST, ALT     Assessment  73 y.o. female with a new diagnosis of cancer of the the right breast upper outer quadrant.  Clinical IA, estrogen receptor positive, progesterone receptor positive and Her2/Neu protein/oncogene negative. I discussed with the patient and family members present today initial surgical treatment options. We discussed options of breast conservation with lumpectomy or total mastectomy and sentinal lymph node biopsy/dissection. Options for reconstruction were discussed. After discussion they have elected to proceed with right partial mastectomy.  We discussed the indications and nature of the procedure, and expected recovery, in detail. Surgical risks including anesthetic complications, cardiorespiratory complications, bleeding, infection, wound healing complications, blood clots, lymphedema, local and distant recurrence and possible need for further surgery based on the final pathology was discussed and  understood.  Chemotherapy, hormonal therapy and radiation therapy have been discussed. They have been provided with literature regarding the treatment of breast cancer.  All questions were answered. They understand and agree to proceed and we will go ahead with scheduling.  Plan Needle localized right partial mastectomy and right axillary sentinel lymph node biopsy under general anesthesia  Benjamin T Hoxworth MD, FACS  05/18/2013, 3:05 PM 

## 2013-05-29 NOTE — Discharge Instructions (Signed)
Post Anesthesia Home Care Instructions  Activity: Get plenty of rest for the remainder of the day. A responsible adult should stay with you for 24 hours following the procedure.  For the next 24 hours, DO NOT: -Drive a car -Paediatric nurse -Drink alcoholic beverages -Take any medication unless instructed by your physician -Make any legal decisions or sign important papers.  Meals: Start with liquid foods such as gelatin or soup. Progress to regular foods as tolerated. Avoid greasy, spicy, heavy foods. If nausea and/or vomiting occur, drink only clear liquids until the nausea and/or vomiting subsides. Call your physician if vomiting continues.  Special Instructions/Symptoms: Your throat may feel dry or sore from the anesthesia or the breathing tube placed in your throat during surgery. If this causes discomfort, gargle with warm salt water. The discomfort should disappear within 24 hours. Call your surgeon if you experience:   1.  Fever over 101.0. 2.  Inability to urinate. 3.  Nausea and/or vomiting. 4.  Extreme swelling or bruising at the surgical site. 5.  Continued bleeding from the incision. 6.  Increased pain, redness or drainage from the incision. 7.  Problems related to your pain medication.[place] Office Phone Number [phone]  BREAST BIOPSY/LUMPECTOMY: POST OP INSTRUCTIONS  Always review your discharge instruction sheet given to you by the facility where your surgery was performed.  IF YOU HAVE DISABILITY OR FAMILY LEAVE FORMS, YOU MUST BRING THEM TO THE OFFICE FOR PROCESSING.  DO NOT GIVE THEM TO YOUR DOCTOR.  1. A prescription for pain medication may be given to you upon discharge.  Take your pain medication as prescribed, if needed.  If narcotic pain medicine is not needed, then you may take acetaminophen (Tylenol) or ibuprofen (Advil) as needed. 2. Take your usually prescribed medications unless otherwise directed 3. If you need a refill on  your pain medication, please contact your pharmacy.  They will contact our office to request authorization.  Prescriptions will not be filled after 5pm or on week-ends. 4. You should eat very light the first 24 hours after surgery, such as soup, crackers, pudding, etc.  Resume your normal diet the day after surgery. 5. Most patients will experience some swelling and bruising in the breast.  Ice packs and a good support bra will help.  Swelling and bruising can take several days to resolve.  6. It is common to experience some constipation if taking pain medication after surgery.  Increasing fluid intake and taking a stool softener will usually help or prevent this problem from occurring.  A mild laxative (Milk of Magnesia or Miralax) should be taken according to package directions if there are no bowel movements after 48 hours. 7. Unless discharge instructions indicate otherwise, you may remove your bandages 24-48 hours after surgery, and you may shower at that time.  You may have steri-strips (small skin tapes) in place directly over the incision.  These strips should be left on the skin for 7-10 days.  If your surgeon used skin glue on the incision, you may shower in 24 hours.  The glue will flake off over the next 2-3 weeks.  Any sutures or staples will be removed at the office during your follow-up visit. 8. ACTIVITIES:  You may resume regular daily activities (gradually increasing) beginning the next day.  Wearing a good support bra or sports bra minimizes pain and swelling.  You may have sexual intercourse when it is comfortable. a. You may drive when you no longer are taking prescription  pain medication, you can comfortably wear a seatbelt, and you can safely maneuver your car and apply brakes. b. RETURN TO WORK:  ______________________________________________________________________________________ 9. You should see your doctor in the office for a follow-up appointment approximately two weeks after  your surgery.  Your doctors nurse will typically make your follow-up appointment when she calls you with your pathology report.  Expect your pathology report 2-3 business days after your surgery.  You may call to check if you do not hear from [place] after three days. 10. OTHER INSTRUCTIONS: _______________________________________________________________________________________________ _____________________________________________________________________________________________________________________________________ _____________________________________________________________________________________________________________________________________ _____________________________________________________________________________________________________________________________________  WHEN TO CALL YOUR DOCTOR: 1. Fever over 101.0 2. Nausea and/or vomiting. 3. Extreme swelling or bruising. 4. Continued bleeding from incision. 5. Increased pain, redness, or drainage from the incision.  The clinic staff is available to answer your questions during regular business hours.  Please dont hesitate to call and ask to speak to one of the nurses for clinical concerns.  If you have a medical emergency, go to the nearest emergency room or call 911.  A surgeon from [facility] is always on call at the hospital.  For further questions, please visit [email or web address]

## 2013-05-29 NOTE — Anesthesia Postprocedure Evaluation (Signed)
  Anesthesia Post-op Note  Patient: Gabriella Walker  Procedure(s) Performed: Procedure(s): BREAST LUMPECTOMY WITH NEEDLE LOCALIZATION AND AXILLARY SENTINEL LYMPH NODE BX (Right)  Patient Location: PACU  Anesthesia Type:General and block  Level of Consciousness: awake and alert   Airway and Oxygen Therapy: Patient Spontanous Breathing  Post-op Pain: mild  Post-op Assessment: Post-op Vital signs reviewed, Patient's Cardiovascular Status Stable and Respiratory Function Stable  Post-op Vital Signs: Reviewed  Filed Vitals:   05/29/13 1430  BP: 131/72  Pulse: 81  Temp:   Resp: 12    Complications: No apparent anesthesia complications

## 2013-05-29 NOTE — Op Note (Signed)
Preoperative Diagnosis: right breast cancer   Postoprative Diagnosis: right breast cancer   Procedure: Procedure(s): BLUE DYE INJECTION RIGHT BREAST,BREAST LUMPECTOMY WITH NEEDLE LOCALIZATION AND AXILLARY SENTINEL LYMPH NODE BX   Surgeon: Excell Seltzer T   Assistants: None  Anesthesia:  General LMA anesthesia  Indications:  Patient is a 73 year old female with a recent diagnosis of a stage IA invasive ductal carcinoma of the  Right breast. After extensive workup and treatment planning detailed elsewhere we have elected to proceed with needle localized right breast lumpectomy and right axillary sentinel lymph node biopsy as initial surgical therapy.   Procedure Detail:  Following accurate needle localization at the breast center, the patient was brought to the holding area and with IV sedation 1 mCi of technetium sulfur colloid was injected intradermally around the right nipple.  The patient was taken to the operating room, placed in supine position on the operating table and laryngeal mask general anesthesia induced. The patient, was performed under sterile technique I injected 5 cc of dilute methylene blue subcutaneously beneath the right nipple and massaged this for several minutes. The patient's right arm was carefully positioned the extended on arm board and the entire right breast and chest and upper arm and axilla were widely sterilely prepped and draped. Patient timeout was again performed and procedure verified. A curvilinear incision was made at the areolar border superiorly and dissection was carried down through the subcutaneous tissue. As the dissection was deepened down to the breast the localization wire was brought into the incision. I then excised a generous amount of breast tissue around the shaft and the tip of the wire in order to obtain a negative margin. There was a palpable mass within the specimen. Specimen mammogram was obtained showing the clip and the attack  localization wire centrally located in the specimen. It had been inked for margins and was localized and sent for permanent pathology. Palpating the specimen the mass felt a little closer to the superficial margin and I excised further tissue at the superficial margin which was oriented and sent for permanent pathology. The wound was irrigated and complete hemostasis obtained. The lumpectomy cavity was marked with clips. The breast the subcutaneous tissue was closed with interrupted 3-0 Vicryl and the skin with subcuticular 5-0 Monocryl. Following this a hot area in the right axilla was localized with the neoprobe a small transverse incision made. Dissection was carried down to subcutaneous tissue and clavipectoral fascia with cautery. Using the neoprobe as a guide I bluntly dissected onto a soft slightly enlarged node with blue dye and high counts. This was completely excised with cautery. Ex vivo the node had counts of about 280 with background of the axilla of less than 10. There was no palpable adenopathy. This was sent for permanent pathology as hot blue right axillary sentinel lymph node. Hemostasis was obtained in this wound which was closed in layers with interrupted 3-0 Vicryl and skin with 5-0 Monocryl. Dermabond was used on both incisions. Sponge needle and instrument counts were correct.    Estimated Blood Loss:  Minimal         Drains: none  Blood Given: none          Specimens: #1 right breast lumpectomy     #2 further anterior margin below the incision     #3 further anterior margin above incision        Complications:  * No complications entered in OR log *  Disposition: PACU - hemodynamically stable.         Condition: stable

## 2013-05-29 NOTE — Transfer of Care (Signed)
Immediate Anesthesia Transfer of Care Note  Patient: Gabriella Walker  Procedure(s) Performed: Procedure(s): BREAST LUMPECTOMY WITH NEEDLE LOCALIZATION AND AXILLARY SENTINEL LYMPH NODE BX (Right)  Patient Location: PACU  Anesthesia Type:GA combined with regional for post-op pain  Level of Consciousness: awake and patient cooperative  Airway & Oxygen Therapy: Patient Spontanous Breathing and Patient connected to face mask oxygen  Post-op Assessment: Report given to PACU RN and Post -op Vital signs reviewed and stable  Post vital signs: Reviewed and stable  Complications: No apparent anesthesia complications

## 2013-05-29 NOTE — Anesthesia Procedure Notes (Addendum)
Anesthesia Regional Block:  Pectoralis block  Pre-Anesthetic Checklist: ,, timeout performed, Correct Patient, Correct Site, Correct Laterality, Correct Procedure, Correct Position, site marked, Risks and benefits discussed, pre-op evaluation, post-op pain management  Laterality: Right  Prep: Maximum Sterile Barrier Precautions used and chloraprep       Needles:  Injection technique: Single-shot  Needle Type: Echogenic Stimulator Needle     Needle Length: 9cm 9 cm Needle Gauge: 21 and 21 G    Additional Needles:  Procedures: ultrasound guided (picture in chart) Pectoralis block Narrative:  Start time: 05/29/2013 11:18 AM End time: 05/29/2013 11:29 AM Injection made incrementally with aspirations every 5 mL. Anesthesiologist: Fitzgerald,MD  Additional Notes: 2% Lidocaine skin wheel.   Procedure Name: LMA Insertion Date/Time: 05/29/2013 12:01 PM Performed by: Petronella Shuford Pre-anesthesia Checklist: Patient identified, Emergency Drugs available, Suction available and Patient being monitored Patient Re-evaluated:Patient Re-evaluated prior to inductionOxygen Delivery Method: Circle System Utilized Preoxygenation: Pre-oxygenation with 100% oxygen Intubation Type: IV induction Ventilation: Mask ventilation without difficulty LMA: LMA inserted LMA Size: 4.0 Number of attempts: 1 Airway Equipment and Method: bite block Placement Confirmation: positive ETCO2 Tube secured with: Tape Dental Injury: Teeth and Oropharynx as per pre-operative assessment

## 2013-06-01 ENCOUNTER — Encounter (HOSPITAL_BASED_OUTPATIENT_CLINIC_OR_DEPARTMENT_OTHER): Payer: Self-pay | Admitting: General Surgery

## 2013-06-03 ENCOUNTER — Telehealth (INDEPENDENT_AMBULATORY_CARE_PROVIDER_SITE_OTHER): Payer: Self-pay | Admitting: General Surgery

## 2013-06-03 NOTE — Telephone Encounter (Signed)
Further discussed re excision.  She id node neg and ER pos and will not need port or urgent oncology referral

## 2013-06-03 NOTE — Telephone Encounter (Signed)
Called pt and discussed positive margin.  Have reccommended reexcision of superior margin and she is agreeable.  Would like her to see med onc first so if chemo is reccommended she could have port at time of re excision

## 2013-06-08 ENCOUNTER — Encounter (HOSPITAL_BASED_OUTPATIENT_CLINIC_OR_DEPARTMENT_OTHER): Payer: Self-pay | Admitting: *Deleted

## 2013-06-09 ENCOUNTER — Ambulatory Visit: Admit: 2013-06-09 | Payer: Self-pay | Admitting: General Surgery

## 2013-06-09 SURGERY — EXCISION, LESION, BREAST
Anesthesia: General | Site: Breast | Laterality: Right

## 2013-06-10 ENCOUNTER — Ambulatory Visit (INDEPENDENT_AMBULATORY_CARE_PROVIDER_SITE_OTHER): Payer: Medicare Other | Admitting: General Surgery

## 2013-06-10 ENCOUNTER — Encounter (INDEPENDENT_AMBULATORY_CARE_PROVIDER_SITE_OTHER): Payer: Self-pay | Admitting: General Surgery

## 2013-06-10 VITALS — BP 124/80 | HR 64 | Temp 97.5°F | Ht 63.0 in | Wt 200.8 lb

## 2013-06-10 DIAGNOSIS — C50919 Malignant neoplasm of unspecified site of unspecified female breast: Secondary | ICD-10-CM

## 2013-06-10 DIAGNOSIS — C50911 Malignant neoplasm of unspecified site of right female breast: Secondary | ICD-10-CM

## 2013-06-10 NOTE — Progress Notes (Signed)
Chief complaint: Followup right breast lumpectomy  History: Patient returns for followup status post right breast lumpectomy and right axillary sentinel lymph node biopsy on 05/29/2013. We have previously discussed her pathology which revealed a negative sentinel lymph node but the final margin is positive at the superior margin. This was a broadly positive margin. I discussed with her that I would recommend reexcision of that margin and explained the rationale and she is in agreement. She is having no problems from her for surgery.  Exam: BP 124/80  Pulse 64  Temp(Src) 97.5 F (36.4 C) (Temporal)  Ht 5\' 3"  (1.6 m)  Wt 200 lb 12.8 oz (91.082 kg)  BMI 35.58 kg/m2 General: Appears well Skin: No rash or infection Lungs: Clear breath sounds bilaterally Cardiac: Regular rate and rhythm Breasts: Healing incision right breast without complicating factors  Physical plan: Status post lumpectomy as above with positive superior margin. Plan reexcision of superior margin later this week.

## 2013-06-12 ENCOUNTER — Ambulatory Visit (HOSPITAL_BASED_OUTPATIENT_CLINIC_OR_DEPARTMENT_OTHER)
Admission: RE | Admit: 2013-06-12 | Discharge: 2013-06-12 | Disposition: A | Discharge status: Home / Self Care | Payer: Medicare Other | Source: Ambulatory Visit | Attending: General Surgery | Admitting: General Surgery

## 2013-06-12 ENCOUNTER — Encounter (HOSPITAL_BASED_OUTPATIENT_CLINIC_OR_DEPARTMENT_OTHER): Payer: Medicare Other | Admitting: Anesthesiology

## 2013-06-12 ENCOUNTER — Ambulatory Visit (HOSPITAL_BASED_OUTPATIENT_CLINIC_OR_DEPARTMENT_OTHER): Payer: Medicare Other | Admitting: Anesthesiology

## 2013-06-12 ENCOUNTER — Encounter (HOSPITAL_BASED_OUTPATIENT_CLINIC_OR_DEPARTMENT_OTHER)
Admission: RE | Disposition: A | Discharge status: Home / Self Care | Payer: Self-pay | Source: Ambulatory Visit | Attending: General Surgery

## 2013-06-12 ENCOUNTER — Encounter (HOSPITAL_BASED_OUTPATIENT_CLINIC_OR_DEPARTMENT_OTHER): Payer: Self-pay | Admitting: Anesthesiology

## 2013-06-12 DIAGNOSIS — N6019 Diffuse cystic mastopathy of unspecified breast: Secondary | ICD-10-CM

## 2013-06-12 DIAGNOSIS — I10 Essential (primary) hypertension: Secondary | ICD-10-CM | POA: Insufficient documentation

## 2013-06-12 DIAGNOSIS — C50911 Malignant neoplasm of unspecified site of right female breast: Secondary | ICD-10-CM

## 2013-06-12 DIAGNOSIS — C50919 Malignant neoplasm of unspecified site of unspecified female breast: Secondary | ICD-10-CM | POA: Diagnosis not present

## 2013-06-12 DIAGNOSIS — D0590 Unspecified type of carcinoma in situ of unspecified breast: Secondary | ICD-10-CM

## 2013-06-12 DIAGNOSIS — R920 Mammographic microcalcification found on diagnostic imaging of breast: Secondary | ICD-10-CM

## 2013-06-12 DIAGNOSIS — N6029 Fibroadenosis of unspecified breast: Secondary | ICD-10-CM | POA: Diagnosis not present

## 2013-06-12 HISTORY — PX: RE-EXCISION OF BREAST LUMPECTOMY: SHX6048

## 2013-06-12 LAB — POCT HEMOGLOBIN-HEMACUE: Hemoglobin: 13.2 g/dL (ref 12.0–15.0)

## 2013-06-12 SURGERY — EXCISION, LESION, BREAST
Anesthesia: General | Site: Breast | Laterality: Right

## 2013-06-12 MED ORDER — HYDROMORPHONE HCL PF 1 MG/ML IJ SOLN
INTRAMUSCULAR | Status: AC
  Filled 2013-06-12: qty 1

## 2013-06-12 MED ORDER — HYDROMORPHONE HCL PF 1 MG/ML IJ SOLN
0.2500 mg | INTRAMUSCULAR | Status: DC | PRN
  Administered 2013-06-12 (×2): 0.5 mg via INTRAVENOUS

## 2013-06-12 MED ORDER — METOCLOPRAMIDE HCL 5 MG/ML IJ SOLN: 10.0000 mg | Freq: Once | INTRAMUSCULAR | Status: DC | PRN

## 2013-06-12 MED ORDER — PROPOFOL 10 MG/ML IV BOLUS
INTRAVENOUS | Status: AC
  Filled 2013-06-12: qty 100

## 2013-06-12 MED ORDER — LACTATED RINGERS IV SOLN
INTRAVENOUS | Status: DC
  Administered 2013-06-12: 09:00:00 via INTRAVENOUS

## 2013-06-12 MED ORDER — BUPIVACAINE-EPINEPHRINE 0.5% -1:200000 IJ SOLN
INTRAMUSCULAR | Status: DC | PRN
  Administered 2013-06-12: 10 mL

## 2013-06-12 MED ORDER — MIDAZOLAM HCL 2 MG/2ML IJ SOLN
INTRAMUSCULAR | Status: AC
  Filled 2013-06-12: qty 2

## 2013-06-12 MED ORDER — OXYCODONE HCL 5 MG/5ML PO SOLN: 5.0000 mg | Freq: Once | ORAL | Status: DC | PRN

## 2013-06-12 MED ORDER — OXYCODONE HCL 5 MG PO TABS: 5.0000 mg | ORAL_TABLET | Freq: Once | ORAL | Status: DC | PRN

## 2013-06-12 MED ORDER — BUPIVACAINE-EPINEPHRINE (PF) 0.5% -1:200000 IJ SOLN
INTRAMUSCULAR | Status: AC
  Filled 2013-06-12: qty 30

## 2013-06-12 MED ORDER — FENTANYL CITRATE 0.05 MG/ML IJ SOLN: 50.0000 ug | INTRAMUSCULAR | Status: DC | PRN

## 2013-06-12 MED ORDER — CEFAZOLIN SODIUM-DEXTROSE 2-3 GM-% IV SOLR
INTRAVENOUS | Status: AC
  Filled 2013-06-12: qty 50

## 2013-06-12 MED ORDER — DEXAMETHASONE SODIUM PHOSPHATE 4 MG/ML IJ SOLN
INTRAMUSCULAR | Status: DC | PRN
  Administered 2013-06-12: 10 mg via INTRAVENOUS

## 2013-06-12 MED ORDER — ONDANSETRON HCL 4 MG/2ML IJ SOLN
INTRAMUSCULAR | Status: DC | PRN
  Administered 2013-06-12: 4 mg via INTRAVENOUS

## 2013-06-12 MED ORDER — MIDAZOLAM HCL 2 MG/2ML IJ SOLN: 1.0000 mg | INTRAMUSCULAR | Status: DC | PRN

## 2013-06-12 MED ORDER — 0.9 % SODIUM CHLORIDE (POUR BTL) OPTIME
TOPICAL | Status: DC | PRN
  Administered 2013-06-12: 50 mL

## 2013-06-12 MED ORDER — FENTANYL CITRATE 0.05 MG/ML IJ SOLN
INTRAMUSCULAR | Status: AC
  Filled 2013-06-12: qty 6

## 2013-06-12 MED ORDER — PROPOFOL 10 MG/ML IV BOLUS
INTRAVENOUS | Status: DC | PRN
  Administered 2013-06-12: 200 mg via INTRAVENOUS

## 2013-06-12 MED ORDER — EPHEDRINE SULFATE 50 MG/ML IJ SOLN
INTRAMUSCULAR | Status: DC | PRN
  Administered 2013-06-12: 15 mg via INTRAVENOUS

## 2013-06-12 MED ORDER — CEFAZOLIN SODIUM-DEXTROSE 2-3 GM-% IV SOLR
INTRAVENOUS | Status: DC | PRN
  Administered 2013-06-12: 2 g via INTRAVENOUS

## 2013-06-12 MED ORDER — LIDOCAINE HCL (CARDIAC) 20 MG/ML IV SOLN
INTRAVENOUS | Status: DC | PRN
  Administered 2013-06-12: 50 mg via INTRAVENOUS

## 2013-06-12 MED ORDER — FENTANYL CITRATE 0.05 MG/ML IJ SOLN
INTRAMUSCULAR | Status: DC | PRN
  Administered 2013-06-12: 50 ug via INTRAVENOUS

## 2013-06-12 MED ORDER — MIDAZOLAM HCL 5 MG/5ML IJ SOLN
INTRAMUSCULAR | Status: DC | PRN
  Administered 2013-06-12: 1 mg via INTRAVENOUS

## 2013-06-12 SURGICAL SUPPLY — 51 items
ADH SKN CLS APL DERMABOND .7 (GAUZE/BANDAGES/DRESSINGS) ×1
BLADE SURG 10 STRL SS (BLADE) IMPLANT
BLADE SURG 15 STRL LF DISP TIS (BLADE) ×1 IMPLANT
BLADE SURG 15 STRL SS (BLADE) ×2
CANISTER SUCT 1200ML W/VALVE (MISCELLANEOUS) ×2 IMPLANT
CHLORAPREP W/TINT 26ML (MISCELLANEOUS) ×2 IMPLANT
CLIP TI MEDIUM 6 (CLIP) IMPLANT
CLIP TI WIDE RED SMALL 6 (CLIP) ×1 IMPLANT
COVER MAYO STAND STRL (DRAPES) ×2 IMPLANT
COVER TABLE BACK 60X90 (DRAPES) ×2 IMPLANT
DERMABOND ADVANCED (GAUZE/BANDAGES/DRESSINGS) ×1
DERMABOND ADVANCED .7 DNX12 (GAUZE/BANDAGES/DRESSINGS) IMPLANT
DEVICE DUBIN W/COMP PLATE 8390 (MISCELLANEOUS) IMPLANT
DRAPE PED LAPAROTOMY (DRAPES) ×2 IMPLANT
DRAPE UTILITY XL STRL (DRAPES) ×2 IMPLANT
ELECT COATED BLADE 2.86 ST (ELECTRODE) ×2 IMPLANT
ELECT REM PT RETURN 9FT ADLT (ELECTROSURGICAL) ×2
ELECTRODE REM PT RTRN 9FT ADLT (ELECTROSURGICAL) ×1 IMPLANT
GLOVE BIO SURGEON STRL SZ7 (GLOVE) ×1 IMPLANT
GLOVE BIOGEL PI IND STRL 7.0 (GLOVE) IMPLANT
GLOVE BIOGEL PI IND STRL 8 (GLOVE) ×1 IMPLANT
GLOVE BIOGEL PI INDICATOR 7.0 (GLOVE) ×2
GLOVE BIOGEL PI INDICATOR 8 (GLOVE) ×1
GLOVE ECLIPSE 6.5 STRL STRAW (GLOVE) ×1 IMPLANT
GLOVE SS BIOGEL STRL SZ 7.5 (GLOVE) ×1 IMPLANT
GLOVE SUPERSENSE BIOGEL SZ 7.5 (GLOVE) ×1
GOWN STRL REUS W/ TWL LRG LVL3 (GOWN DISPOSABLE) ×1 IMPLANT
GOWN STRL REUS W/ TWL XL LVL3 (GOWN DISPOSABLE) ×1 IMPLANT
GOWN STRL REUS W/TWL LRG LVL3 (GOWN DISPOSABLE) ×4
GOWN STRL REUS W/TWL XL LVL3 (GOWN DISPOSABLE) ×2
KIT MARKER MARGIN INK (KITS) IMPLANT
NDL HYPO 25X1 1.5 SAFETY (NEEDLE) ×1 IMPLANT
NEEDLE HYPO 25X1 1.5 SAFETY (NEEDLE) ×2 IMPLANT
NS IRRIG 1000ML POUR BTL (IV SOLUTION) ×2 IMPLANT
PACK BASIN DAY SURGERY FS (CUSTOM PROCEDURE TRAY) ×2 IMPLANT
PENCIL BUTTON HOLSTER BLD 10FT (ELECTRODE) ×2 IMPLANT
SLEEVE SCD COMPRESS KNEE MED (MISCELLANEOUS) IMPLANT
STAPLER VISISTAT 35W (STAPLE) IMPLANT
SUT MNCRL AB 4-0 PS2 18 (SUTURE) IMPLANT
SUT MON AB 5-0 PS2 18 (SUTURE) ×1 IMPLANT
SUT SILK 3 0 SH 30 (SUTURE) ×1 IMPLANT
SUT VIC AB 3-0 SH 27 (SUTURE)
SUT VIC AB 3-0 SH 27X BRD (SUTURE) IMPLANT
SUT VIC AB 4-0 BRD 54 (SUTURE) IMPLANT
SUT VICRYL 3-0 CR8 SH (SUTURE) ×1 IMPLANT
SYR BULB 3OZ (MISCELLANEOUS) IMPLANT
SYR CONTROL 10ML LL (SYRINGE) ×2 IMPLANT
TOWEL OR 17X24 6PK STRL BLUE (TOWEL DISPOSABLE) ×4 IMPLANT
TOWEL OR NON WOVEN STRL DISP B (DISPOSABLE) ×2 IMPLANT
TUBE CONNECTING 20X1/4 (TUBING) ×2 IMPLANT
YANKAUER SUCT BULB TIP NO VENT (SUCTIONS) ×2 IMPLANT

## 2013-06-12 NOTE — Anesthesia Postprocedure Evaluation (Signed)
Anesthesia Post Note  Patient: Gabriella Walker  Procedure(s) Performed: Procedure(s) (LRB): RE-EXCISION OF BREAST LUMPECTOMY (Right)  Anesthesia type: General  Patient location: PACU  Post pain: Pain level controlled  Post assessment: Patient's Cardiovascular Status Stable  Last Vitals:  Filed Vitals:   06/12/13 1145  BP: 118/58  Pulse: 69  Temp:   Resp: 15    Post vital signs: Reviewed and stable  Level of consciousness: alert  Complications: No apparent anesthesia complications

## 2013-06-12 NOTE — Transfer of Care (Signed)
Immediate Anesthesia Transfer of Care Note  Patient: Gabriella Walker  Procedure(s) Performed: Procedure(s): RE-EXCISION OF BREAST LUMPECTOMY (Right)  Patient Location: PACU  Anesthesia Type:General  Level of Consciousness: awake, alert  and oriented  Airway & Oxygen Therapy: Patient Spontanous Breathing and Patient connected to face mask oxygen  Post-op Assessment: Report given to PACU RN and Post -op Vital signs reviewed and stable  Post vital signs: Reviewed and stable  Complications: No apparent anesthesia complications

## 2013-06-12 NOTE — Anesthesia Preprocedure Evaluation (Signed)
Anesthesia Evaluation  Patient identified by MRN, date of birth, ID band Patient awake    Reviewed: Allergy & Precautions, H&P , NPO status , Patient's Chart, lab work & pertinent test results, reviewed documented beta blocker date and time   Airway Mallampati: II TM Distance: >3 FB Neck ROM: full    Dental   Pulmonary neg pulmonary ROS,  breath sounds clear to auscultation        Cardiovascular hypertension, On Medications Rhythm:regular     Neuro/Psych negative neurological ROS  negative psych ROS   GI/Hepatic negative GI ROS, Neg liver ROS,   Endo/Other  negative endocrine ROS  Renal/GU negative Renal ROS  negative genitourinary   Musculoskeletal   Abdominal   Peds  Hematology negative hematology ROS (+)   Anesthesia Other Findings See surgeon's H&P   Reproductive/Obstetrics negative OB ROS                           Anesthesia Physical Anesthesia Plan  ASA: II  Anesthesia Plan: General   Post-op Pain Management:    Induction: Intravenous  Airway Management Planned: LMA  Additional Equipment:   Intra-op Plan:   Post-operative Plan:   Informed Consent: I have reviewed the patients History and Physical, chart, labs and discussed the procedure including the risks, benefits and alternatives for the proposed anesthesia with the patient or authorized representative who has indicated his/her understanding and acceptance.   Dental Advisory Given  Plan Discussed with: CRNA and Surgeon  Anesthesia Plan Comments:         Anesthesia Quick Evaluation

## 2013-06-12 NOTE — H&P (Signed)
  History: Patient is status post recent right breast lumpectomy and sentinel lymph node biopsy for invasive ductal carcinoma. Final pathology revealed the superior margin to be involved. We discussed options and elected to proceed with reexcision of the superior margin of her lumpectomy. She currently has no complaints other than mild soreness.  Exam: BP 123/77  Pulse 55  Temp(Src) 98.1 F (36.7 C) (Oral)  Resp 16  Ht 5\' 3"  (1.6 m)  Wt 202 lb 2 oz (91.683 kg)  BMI 35.81 kg/m2  SpO2 98% General: Appears well Skin no rash or infection Lungs: Clear without wheezing or increased work of breathing Cardiac: Regular rate and rhythm without murmurs Breasts: Healing incision right breast without trouble getting factors  Assessment and plan: Right breast cancer status post lumpectomy with involved superior margin. Reexcision superior margin right breast lumpectomy

## 2013-06-12 NOTE — Op Note (Signed)
Preoperative Diagnosis: cancer right breast   Postoprative Diagnosis: cancer right breast   Procedure: Procedure(s): RE-EXCISION OF BREAST LUMPECTOMY   Surgeon: Excell Seltzer T   Assistants: None  Anesthesia:  General LMA anesthesia  Indications: patient is recently status post right breast lumpectomy for invasive ductal carcinoma. Final pathology showed a positive superior margin. I discussed options with the patient and we have elected to proceed with reexcision of the superior margin.    Procedure Detail:  Patient was brought to the operating room, placed in supine position and laryngeal mask general anesthesia induced. The entire right breast was widely sterilely prepped and draped. She received preoperative IV antibiotics. Patient  Time out was performed and correct procedure verified. The previous incision was sharply opened and dissection carried down through the subcutaneous tissue. Previous suture material was removed and the lumpectomy cavity widely opened and exposed. The superior margin was exposed and using cautery I completely excised the superior margin for about 1 cm in depth. This was oriented with suture and sent for permanent section. Hemostasis was obtained with cautery. The new margin was marked with clips. The soft tissue was infiltrated with Marcaine. Breast and subcutaneous tissue was closed with interrupted 3-0 Vicryl and the skin with subcuticular 5-0 Monocryl and Dermabond. Sponge The counts were correct.          Specimens: right breast reexcision of superior margin oriented with suture        Complications:  * No complications entered in OR log *         Disposition: PACU - hemodynamically stable.         Condition: stable

## 2013-06-12 NOTE — Discharge Instructions (Signed)
[facility] °Office Phone Number [phone] ° °BREAST BIOPSY/ PARTIAL MASTECTOMY: POST OP INSTRUCTIONS ° °Always review your discharge instruction sheet given to you by the facility where your surgery was performed. ° °IF YOU HAVE DISABILITY OR FAMILY LEAVE FORMS, YOU MUST BRING THEM TO THE OFFICE FOR PROCESSING.  DO NOT GIVE THEM TO YOUR DOCTOR. ° °1. A prescription for pain medication may be given to you upon discharge.  Take your pain medication as prescribed, if needed.  If narcotic pain medicine is not needed, then you may take acetaminophen (Tylenol) or ibuprofen (Advil) as needed. °2. Take your usually prescribed medications unless otherwise directed °3. If you need a refill on your pain medication, please contact your pharmacy.  They will contact our office to request authorization.  Prescriptions will not be filled after 5pm or on week-ends. °4. You should eat very light the first 24 hours after surgery, such as soup, crackers, pudding, etc.  Resume your normal diet the day after surgery. °5. Most patients will experience some swelling and bruising in the breast.  Ice packs and a good support bra will help.  Swelling and bruising can take several days to resolve.  °6. It is common to experience some constipation if taking pain medication after surgery.  Increasing fluid intake and taking a stool softener will usually help or prevent this problem from occurring.  A mild laxative (Milk of Magnesia or Miralax) should be taken according to package directions if there are no bowel movements after 48 hours. °7. Unless discharge instructions indicate otherwise, you may remove your bandages 24-48 hours after surgery, and you may shower at that time.  You may have steri-strips (small skin tapes) in place directly over the incision.  These strips should be left on the skin for 7-10 days.  If your surgeon used skin glue on the incision, you may shower in 24 hours.  The glue will flake off over the  next 2-3 weeks.  Any sutures or staples will be removed at the office during your follow-up visit. °8. ACTIVITIES:  You may resume regular daily activities (gradually increasing) beginning the next day.  Wearing a good support bra or sports bra minimizes pain and swelling.  You may have sexual intercourse when it is comfortable. °a. You may drive when you no longer are taking prescription pain medication, you can comfortably wear a seatbelt, and you can safely maneuver your car and apply brakes. °b. RETURN TO WORK:  ______________________________________________________________________________________ °9. You should see your doctor in the office for a follow-up appointment approximately two weeks after your surgery.  Your doctor’s nurse will typically make your follow-up appointment when she calls you with your pathology report.  Expect your pathology report 2-3 business days after your surgery.  You may call to check if you do not hear from us after three days. °10. OTHER INSTRUCTIONS: _______________________________________________________________________________________________ _____________________________________________________________________________________________________________________________________ °_____________________________________________________________________________________________________________________________________ °_____________________________________________________________________________________________________________________________________ ° °WHEN TO CALL YOUR DOCTOR: °1. Fever over 101.0 °2. Nausea and/or vomiting. °3. Extreme swelling or bruising. °4. Continued bleeding from incision. °5. Increased pain, redness, or drainage from the incision. ° °The clinic staff is available to answer your questions during regular business hours.  Please don’t hesitate to call and ask to speak to one of the nurses for clinical concerns.  If you have a medical emergency, go to the nearest  emergency room or call 911.  A surgeon from [facility] is always on call at the hospital. ° °For further questions, please visit [email or web address]  ° ° °  Post Anesthesia Home Care Instructions ° °Activity: °Get plenty of rest for the remainder of the day. A responsible adult should stay with you for 24 hours following the procedure.  °For the next 24 hours, DO NOT: °-Drive a car °-Operate machinery °-Drink alcoholic beverages °-Take any medication unless instructed by your physician °-Make any legal decisions or sign important papers. ° °Meals: °Start with liquid foods such as gelatin or soup. Progress to regular foods as tolerated. Avoid greasy, spicy, heavy foods. If nausea and/or vomiting occur, drink only clear liquids until the nausea and/or vomiting subsides. Call your physician if vomiting continues. ° °Special Instructions/Symptoms: °Your throat may feel dry or sore from the anesthesia or the breathing tube placed in your throat during surgery. If this causes discomfort, gargle with warm salt water. The discomfort should disappear within 24 hours. ° °

## 2013-06-15 ENCOUNTER — Telehealth (INDEPENDENT_AMBULATORY_CARE_PROVIDER_SITE_OTHER): Payer: Self-pay | Admitting: General Surgery

## 2013-06-15 ENCOUNTER — Encounter (HOSPITAL_BASED_OUTPATIENT_CLINIC_OR_DEPARTMENT_OTHER): Payer: Self-pay | Admitting: General Surgery

## 2013-06-15 NOTE — Telephone Encounter (Signed)
Called pt re path and left message

## 2013-07-08 ENCOUNTER — Encounter (INDEPENDENT_AMBULATORY_CARE_PROVIDER_SITE_OTHER): Payer: Self-pay | Admitting: General Surgery

## 2013-07-08 ENCOUNTER — Ambulatory Visit (INDEPENDENT_AMBULATORY_CARE_PROVIDER_SITE_OTHER): Payer: Medicare Other | Admitting: General Surgery

## 2013-07-08 VITALS — BP 122/74 | HR 78 | Temp 98.3°F | Ht 63.0 in | Wt 200.0 lb

## 2013-07-08 DIAGNOSIS — C50919 Malignant neoplasm of unspecified site of unspecified female breast: Secondary | ICD-10-CM

## 2013-07-08 DIAGNOSIS — C50911 Malignant neoplasm of unspecified site of right female breast: Secondary | ICD-10-CM

## 2013-07-08 NOTE — Progress Notes (Signed)
History: Patient returns for followup status post right breast lumpectomy and negative sentinel lymph node biopsy. She had a positive margin and underwent reexcision. This fortunately showed only biopsy changes and fibrocystic disease it was negative for malignancy. We had previously discussed this report. She is feeling some soreness at the lumpectomy site and in her shoulder.  Exam: BP 122/74  Pulse 78  Temp(Src) 98.3 F (36.8 C)  Ht 5\' 3"  (1.6 m)  Wt 200 lb (90.719 kg)  BMI 35.44 kg/m2 General: Appears well Breasts: Breast incision well healed with expected induration but no serumal or inflammation.  Assessment plan: Doing well following surgery as above. No further surgery indicated. We are making referral for medical and radiation oncology. I will see her back in 4 months but last to call me if her soreness is not significantly better in a couple of weeks.

## 2013-07-13 ENCOUNTER — Telehealth: Payer: Self-pay | Admitting: *Deleted

## 2013-07-13 NOTE — Telephone Encounter (Signed)
Called and confirmed pt to see Dr. Alen Blew on 07/21/13.  Mailed before appt letter, welcoming packet & intake form to pt.  Emailed Christy at [employer] to make her aware.  Took paperwork to HIM to create chart.

## 2013-07-17 NOTE — Progress Notes (Signed)
Location of Breast Cancer: right breast cancer  Histology per Pathology Report:   Diagnosis 1. Breast, lumpectomy, right - INVASIVE DUCTAL CARCINOMA, GRADE II/III, SPANNING 1.8 CM. - DUCTAL CARCINOMA IN SITU, LOW GRADE. - INVASIVE CARCINOMA IS BROADLY PRESENT AT THE ANTERIOR MARGIN AND SUPERIOR MARGIN OF SPECIMEN #1. - SEE ONCOLOGY TABLE BELOW. 2. Breast, excision, right further anterior margin below incision - INVASIVE DUCTAL CARCINOMA. - INVASIVE CARCINOMA IS FOCALLY 0.1 CM TO THE MARGIN OF SPECIMEN # 2. 3. Breast, excision, right, further anterior margin above incision - ATYPICAL DUCTAL HYPERPLASIA. 4. Lymph node, sentinel, biopsy, right - THERE IS NO EVIDENCE OF CARCINOMA IN 1 OF 1 LYMPH NODE (0/1). - SEE COMMENT.  06/12/13 Diagnosis Breast, excision, superior margin right - FIBROCYSTIC CHANGES WITH ADENOSIS AND CALCIFICATIONS. - HEALING BIOPSY SITE. - THERE IS NO EVIDENCE OF MALIGNANCY. - SEE COMMENT. Microscopic Comment  Receptor Status: ER(100% positive), PR (67% positive), Her2-neu (negative)  Did patient present with symptoms (if so, please note symptoms) or was this found on screening mammography?: screening mammography  Past/Anticipated interventions by surgeon, if any:  05/29/13 - Procedure: BREAST LUMPECTOMY WITH NEEDLE LOCALIZATION AND AXILLARY SENTINEL LYMPH NODE BX;  Surgeon: Edward Jolly, MD;  Location: [place];  Service: General;  Laterality: Right; and 06/12/13 - Procedure: RE-EXCISION OF BREAST LUMPECTOMY;  Surgeon: Edward Jolly, MD;  Location: [place];  Service: General;  Laterality: Right  Past/Anticipated interventions by medical oncology, if any: no  Lymphedema issues, if any:  no    Pain issues, if any:  no  SAFETY ISSUES:  Prior radiation? no  Pacemaker/ICD? no  Possible current pregnancy?no  Is the patient on methotrexate? no  Current Complaints / other details:  Patient was 73 years old with  first menses, 24 years with birth of first child, used bcp for 2 years, used estrogen for a year.  Patient has 2 children, 1 deceased.  She is here with her friend who also had radiation in 2010.    Jacqulyn Liner, RN 07/17/2013,3:41 PM

## 2013-07-21 ENCOUNTER — Other Ambulatory Visit: Payer: Medicare Other

## 2013-07-21 ENCOUNTER — Ambulatory Visit (HOSPITAL_BASED_OUTPATIENT_CLINIC_OR_DEPARTMENT_OTHER): Payer: Medicare Other | Admitting: Oncology

## 2013-07-21 ENCOUNTER — Ambulatory Visit: Payer: Medicare Other

## 2013-07-21 ENCOUNTER — Telehealth: Payer: Self-pay | Admitting: Oncology

## 2013-07-21 ENCOUNTER — Encounter: Payer: Self-pay | Admitting: Oncology

## 2013-07-21 VITALS — BP 138/60 | HR 59 | Temp 98.3°F | Resp 18 | Ht 63.0 in | Wt 199.9 lb

## 2013-07-21 DIAGNOSIS — C50919 Malignant neoplasm of unspecified site of unspecified female breast: Secondary | ICD-10-CM

## 2013-07-21 DIAGNOSIS — I10 Essential (primary) hypertension: Secondary | ICD-10-CM

## 2013-07-21 DIAGNOSIS — C50911 Malignant neoplasm of unspecified site of right female breast: Secondary | ICD-10-CM

## 2013-07-21 NOTE — Consult Note (Signed)
Reason for Referral: Breast cancer.   HPI: 73 year old woman have been living in the [place] area for the majority of her life. She is a rather healthy woman with history of hypertension and hyperlipidemia who have done routine mammography since the 80s. On the most recent mammogram, she was noted to have subtle area of architectural distortion. Subsequent imaging included diagnostic mamogram confirming architectural distortion and ultrasound showing a 5 mm hypoechoic mass. 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 She was evaluated by Dr. Excell Seltzer and subsequently underwent a right lumpectomy on 05/29/2013. 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 Invasive carcinoma was noted in the anterior margin. The tumor was ER positive, PR positive HER-2 negative. Her Ki-67 was 5%. Her sentinel lymph node biopsy was negative. Given her close margins, she underwent a reexcision on 06/12/2013 and the pathology was benign. Patient tolerated the procedure well without any complications. She was referred to me for the discussion regarding adjuvant therapy.  Clinically, she is asymptomatic. She does not report any headaches or blurry vision or double vision. She has not reported any syncope. She does not report any seizures or alteration of mental status. She does not report any fevers or chills or sweats or weight loss. She continue to be active and lives independently and attends to activities of daily living. She does report some tenderness around her surgery site as well as in the right axilla. She does not report any chest pain shortness of breath. She had some palpitation of the past but no orthopnea or PND. Does not report any nausea or vomiting or abdominal pain. Current hematochezia or melena. As the grandson ounces or myalgias. Doesn't report any petechiae or rash. Wrist review of systems  unremarkable.   Past Medical History  Diagnosis Date  . HTN (hypertension)   . Hyperlipidemia   . Adenomatous colon polyp   . GERD (gastroesophageal reflux disease)   . Wears glasses   :  Past Surgical History  Procedure Laterality Date  . Appendectomy  1956  . Ectopic pregnancy surgery  1968  . Tubal ligation  1976  . Cardiac catheterization  2008  . Upper gi endoscopy    . Knee arthroscopy  2001    right  . Breast lumpectomy with needle localization and axillary sentinel lymph node bx Right 05/29/2013    Procedure: BREAST LUMPECTOMY WITH NEEDLE LOCALIZATION AND AXILLARY SENTINEL LYMPH NODE BX;  Surgeon: Edward Jolly, MD;  Location: [facility];  Service: General;  Laterality: Right;  . Re-excision of breast lumpectomy Right 06/12/2013    Procedure: RE-EXCISION OF BREAST LUMPECTOMY;  Surgeon: Edward Jolly, MD;  Location: [facility];  Service: General;  Laterality: Right;  :  Current Outpatient Prescriptions  Medication Sig Dispense Refill  . acetaminophen (TYLENOL) 500 MG tablet Take 500 mg by mouth every 6 (six) hours as needed.      . Calcium Carb-Cholecalciferol (CALCIUM + D3 PO) Take 2 tablets by mouth daily.      . cholecalciferol (VITAMIN D) 1000 UNITS tablet Take 1,000 Units by mouth daily.      . fexofenadine (ALLEGRA) 180 MG tablet Take 180 mg by mouth daily.      . fluticasone (FLONASE) 50 MCG/ACT nasal spray as directed.      . furosemide (LASIX) 40 MG tablet prn      . metoprolol (LOPRESSOR) 50 MG tablet Take 1 tablet  by mouth 2 (two) times daily.      . pantoprazole (PROTONIX) 40 MG tablet Take 1 tablet 30 minutes before breakfast  30 tablet  11  . Probiotic Product (PROBIOTIC DAILY PO) Take by mouth daily.      . simvastatin (ZOCOR) 40 MG tablet Take 1 tablet by mouth daily.      . valsartan-hydrochlorothiazide (DIOVAN-HCT) 320-25 MG per tablet Take 1 tablet by mouth daily.       No current facility-administered  medications for this visit.       Allergies  Allergen Reactions  . Prilosec Otc [Omeprazole Magnesium] Itching  :  Family History  Problem Relation Age of Onset  . Coronary artery disease Father     MI at age 71  . Heart disease Father   . Heart disease Mother   . Hypertension Mother   . Colon cancer Neg Hx   . Esophageal cancer Neg Hx   . Rectal cancer Neg Hx   . Stomach cancer Neg Hx   :  History   Social History  . Marital Status: Widowed    Spouse Name: N/A    Number of Children: 2  . Years of Education: N/A   Occupational History  . Not on file.   Social History Main Topics  . Smoking status: Never Smoker   . Smokeless tobacco: Never Used  . Alcohol Use: No  . Drug Use: No  . Sexual Activity: Not on file   Other Topics Concern  . Not on file   Social History Narrative  . No narrative on file  :  Pertinent items are noted in HPI.  Exam: Blood pressure 138/60, pulse 59, temperature 98.3 F (36.8 C), temperature source Oral, resp. rate 18, height $RemoveBe'5\' 3"'BZDlpoZZS$  (1.6 m), weight 199 lb 14.4 oz (90.674 kg), SpO2 98.00%. General appearance: alert and cooperative Head: Normocephalic, without obvious abnormality Throat: lips, mucosa, and tongue normal; teeth and gums normal Neck: no adenopathy Back: symmetric, no curvature. ROM normal. No CVA tenderness. Resp: clear to auscultation bilaterally Cardio: regular rate and rhythm, S1, S2 normal, no murmur, click, rub or gallop GI: soft, non-tender; bowel sounds normal; no masses,  no organomegaly Extremities: extremities normal, atraumatic, no cyanosis or edema Pulses: 2+ and symmetric Skin: Skin color, texture, turgor normal. No rashes or lesions Lymph nodes: Cervical, supraclavicular, and axillary nodes normal.  CBC    Component Value Date/Time   HGB 13.2 06/12/2013 0929     Chemistry      Component Value Date/Time   NA 138 05/26/2013 1030   K 4.6 05/26/2013 1030   CL 99 05/26/2013 1030   CO2 27 05/26/2013  1030   BUN 18 05/26/2013 1030   CREATININE 0.77 05/26/2013 1030      Component Value Date/Time   CALCIUM 9.5 05/26/2013 1030        Assessment and Plan:   73 year old woman diagnosed with invasive 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 She is status post right lumpectomy and her sentinel lymph node biopsy was benign. Her tumor is ER PR positive HER-2 negative. She has a low Ki-67. Her pathological staging is T1cN0.  The natural course of this disease was discussed extensively with the patient and her accompanying friend. I have estimated for her close to 70% relapse free survival without any additional therapy. But certainly she has risk of both local recurrence as well as systemic recurrence. I've asked that would be receiving adjuvant radiation therapy to decrease the  risk of local regional recurrence. She is scheduled to see Dr. Sondra Come on 07/22/2013. Have explained to her the importance of receiving adjuvant radiation therapy in the setting of a lumpectomy rather than a mastectomy.  The role of adjuvant hormonal therapy was discussed with the patient. I explained to her that her risk of relapse improved by at least a 10% by using adjuvant aromatase inhibitors. Risks and benefits of these drugs were discussed. Complications that includes arthralgias, myalgias, osteoporosis and a controversial area cardiovascular effects were discussed. I explained to her the rationale of using adjuvant hormone therapy for at least 5 years based on multiple adjuvant endocrine trials. Written information about this medication was given to the patient and she is willing to proceed. I plan to start this after her radiation therapy.  The role of adjuvant chemotherapy was discussed as well. I estimated marginal benefit of systemic chemotherapy between 2-3% improvement in [place] obtain associated with chemotherapy. Complication of third generation regimens that includes nausea, vomiting, myelosuppression,  neutropenia, neutropenic sepsis, cardiovascular toxicity among others. After discussing the risks and benefits patient declined at this time. I've also discuss with her the role of Oncotype DX testing but she feels that she will probably not go with chemotherapy in any case so we will not do this test at this time.  The plan will be at this point is to proceed with radiation therapy and a followup to start Arimidex hormone therapy.  All her questions are answered to her satisfaction.

## 2013-07-21 NOTE — Progress Notes (Signed)
Please see consult note.  

## 2013-07-21 NOTE — Telephone Encounter (Signed)
gv adn printed appt sched and avs for pt for SEpt °

## 2013-07-22 ENCOUNTER — Ambulatory Visit
Admission: RE | Admit: 2013-07-22 | Discharge: 2013-07-22 | Disposition: A | Discharge status: Home / Self Care | Payer: Medicare Other | Source: Ambulatory Visit | Attending: Radiation Oncology | Admitting: Radiation Oncology

## 2013-07-22 ENCOUNTER — Encounter: Payer: Self-pay | Admitting: Radiation Oncology

## 2013-07-22 VITALS — BP 135/62 | HR 58 | Temp 97.9°F | Ht 63.0 in | Wt 202.7 lb

## 2013-07-22 DIAGNOSIS — Z170 Estrogen receptor positive status [ER+]: Secondary | ICD-10-CM | POA: Diagnosis not present

## 2013-07-22 DIAGNOSIS — Z510 Encounter for antineoplastic radiation therapy: Secondary | ICD-10-CM | POA: Insufficient documentation

## 2013-07-22 DIAGNOSIS — I10 Essential (primary) hypertension: Secondary | ICD-10-CM | POA: Insufficient documentation

## 2013-07-22 DIAGNOSIS — K219 Gastro-esophageal reflux disease without esophagitis: Secondary | ICD-10-CM | POA: Diagnosis not present

## 2013-07-22 DIAGNOSIS — E785 Hyperlipidemia, unspecified: Secondary | ICD-10-CM | POA: Insufficient documentation

## 2013-07-22 DIAGNOSIS — C50419 Malignant neoplasm of upper-outer quadrant of unspecified female breast: Secondary | ICD-10-CM | POA: Diagnosis not present

## 2013-07-22 DIAGNOSIS — Z79899 Other long term (current) drug therapy: Secondary | ICD-10-CM | POA: Insufficient documentation

## 2013-07-22 DIAGNOSIS — C50919 Malignant neoplasm of unspecified site of unspecified female breast: Secondary | ICD-10-CM | POA: Diagnosis not present

## 2013-07-22 DIAGNOSIS — C50911 Malignant neoplasm of unspecified site of right female breast: Secondary | ICD-10-CM

## 2013-07-22 DIAGNOSIS — C50411 Malignant neoplasm of upper-outer quadrant of right female breast: Secondary | ICD-10-CM

## 2013-07-22 NOTE — Progress Notes (Signed)
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 female who is seen out courtesy of Dr. Excell Seltzer for an opinion concerning radiation therapy as part of management of patient's stage I invasive ductal carcinoma the right breast. Earlier this year the patient on routine screening mammography was found to have a area of concern in the upper aspect of the breast. She underwent a needle core biopsy of a lesion within the 12:00 position which revealed invasive mammary carcinoma. The tumor was estrogen receptor positive at 100%, progesterone receptor positive at 67%. Proliferation marker was low at 5%. Patient was seen by general surgery and was felt to be a good candidate for breast conservation therapy. Patient proceeded to undergo a partial mastectomy and sentinel node biopsy under the direction of Dr. Excell Seltzer. The patient was found to have a 1.8 cm grade 2 invasive ductal carcinoma with associated ductal carcinoma in situ. The initial anterior and superior margins were involved. Intraoperatively the patient underwent reexcision of the anterior margin however on final path report the anterior and spare margin were involved.. Patient had one sentinel lymph node removed which showed no evidence of metastatic disease. On June 12 patient was taken back to the operating room for reexcision in light of the positive margin. The reexcision specimen showed fibrocystic changes without malignancy. Patient has done well since her surgery. She was seen by medical oncology who recommended consideration for adjuvant hormonal therapy. Patient is now seen in  radiation oncology for evaluation and discussion of breast conservation therapy.Marland Kitchen  PREVIOUS RADIATION THERAPY: No  PAST MEDICAL HISTORY:  has a past medical history of HTN (hypertension); Hyperlipidemia; Adenomatous colon polyp; GERD (gastroesophageal reflux disease); and Wears glasses.    PAST SURGICAL HISTORY: Past Surgical History  Procedure Laterality Date  . Appendectomy  1956  . Ectopic pregnancy surgery  1968  . Tubal ligation  1976  . Cardiac catheterization  2008  . Upper gi endoscopy    . Knee arthroscopy  2001    right  . Breast lumpectomy with needle localization and axillary sentinel lymph node bx Right 05/29/2013    Procedure: BREAST LUMPECTOMY WITH NEEDLE LOCALIZATION AND AXILLARY SENTINEL LYMPH NODE BX;  Surgeon: Edward Jolly, MD;  Location: [facility];  Service: General;  Laterality: Right;  . Re-excision of breast lumpectomy Right 06/12/2013    Procedure: RE-EXCISION OF BREAST LUMPECTOMY;  Surgeon: Edward Jolly, MD;  Location: [facility];  Service: General;  Laterality: Right;    FAMILY HISTORY: family history includes Coronary artery disease in her father; Heart disease in her father and mother; Hypertension in her mother. There is no history of Colon cancer, Esophageal cancer, Rectal cancer, or Stomach cancer.  SOCIAL HISTORY:  reports that she has never smoked. She has never used smokeless tobacco. She reports that she does not drink alcohol or use illicit drugs.  ALLERGIES: Prilosec otc  MEDICATIONS:  Current Outpatient Prescriptions  Medication Sig Dispense Refill  . acetaminophen (TYLENOL) 500 MG tablet Take 500 mg by mouth every 6 (six) hours as needed.      . Calcium Carb-Cholecalciferol (CALCIUM +  D3 PO) Take 2 tablets by mouth daily.      . cholecalciferol (VITAMIN D) 1000 UNITS tablet Take 1,000 Units by mouth daily.      . fexofenadine (ALLEGRA) 180 MG tablet Take 180 mg by mouth daily.      . fluticasone  (FLONASE) 50 MCG/ACT nasal spray as directed.      . furosemide (LASIX) 40 MG tablet prn      . metoprolol (LOPRESSOR) 50 MG tablet Take 1 tablet by mouth 2 (two) times daily.      . pantoprazole (PROTONIX) 40 MG tablet Take 1 tablet 30 minutes before breakfast  30 tablet  11  . Probiotic Product (PROBIOTIC DAILY PO) Take by mouth daily.      . simvastatin (ZOCOR) 40 MG tablet Take 1 tablet by mouth daily.      . valsartan-hydrochlorothiazide (DIOVAN-HCT) 320-25 MG per tablet Take 1 tablet by mouth daily.       No current facility-administered medications for this encounter.    REVIEW OF SYSTEMS:  A 15 point review of systems is documented in the electronic medical record. This was obtained by the nursing staff. However, I reviewed this with the patient to discuss relevant findings and make appropriate changes.  Prior to diagnosis the patient denied any pain within the breast area nipple discharge or bleeding. She denies any problems swelling in her right arm or hand. She denies any new bony pain headaches dizziness or blurred vision. She has some mild soreness within the breast since her surgery.   PHYSICAL EXAM:  height is 5\' 3"  (1.6 m) and weight is 202 lb 11.2 oz (91.944 kg). Her oral temperature is 97.9 F (36.6 C). Her blood pressure is 135/62 and her pulse is 58.   BP 135/62  Pulse 58  Temp(Src) 97.9 F (36.6 C) (Oral)  Ht 5\' 3"  (1.6 m)  Wt 202 lb 11.2 oz (91.944 kg)  BMI 35.92 kg/m2  General Appearance:    Alert, cooperative, no distress, appears stated age, accompanied by a good friend who I treated approximately 5 years ago for breast cancer   Head:    Normocephalic, without obvious abnormality, atraumatic  Eyes:    PERRL, conjunctiva/corneas clear, EOM's intact,         Nose:   Nares normal, septum midline, mucosa normal, no drainage    or sinus tenderness  Throat:   Lips, mucosa, and tongue normal; teeth and gums normal  Neck:   Supple, symmetrical, trachea midline, no  adenopathy;    thyroid:  no enlargement/tenderness/nodules; no carotid   bruit or JVD  Back:     Symmetric, no curvature, ROM normal, no CVA tenderness  Lungs:     Clear to auscultation bilaterally, respirations unlabored  Chest Wall:    No tenderness or deformity   Heart:    Regular rate and rhythm, S1 and S2 normal, no murmur, rub   or gallop  Breast Exam:    left breast: No tenderness, masses, or nipple abnormality, the right breast shows a curvy linear scar in the 12:00 position of the right breast, adjacent to the areolar border. Mild nipple retraction. No signs of drainage or infection. No palpable mass within the right breast. She also has a separate scar in the axillary region from sentinel node procedure.   Abdomen:     Soft, non-tender, bowel sounds active all four quadrants,    no masses, no organomegaly        Extremities:  Extremities normal, atraumatic, no cyanosis or edema  Pulses:   2+ and symmetric all extremities  Skin:   Skin color, texture, turgor normal, no rashes or lesions  Lymph nodes:   Cervical, supraclavicular, and axillary nodes normal  Neurologic:    normal strength, sensation and reflexes    throughout     ECOG = 0  0 - Asymptomatic (Fully active, able to carry on all predisease activities without restriction)  LABORATORY DATA:  Lab Results  Component Value Date   HGB 13.2 06/12/2013   Lab Results  Component Value Date   NA 138 05/26/2013   K 4.6 05/26/2013   CL 99 05/26/2013   CO2 27 05/26/2013   GLUCOSE 97 05/26/2013   CREATININE 0.77 05/26/2013   CALCIUM 9.5 05/26/2013      RADIOGRAPHY: No results found.    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 The patient would be an excellent candidate for breast conservation therapy. In light of the tumor size as well as the patient's age and clear surgical margins she would be a candidate for partial mastectomy alone for management. Patient does understand that she  would have slightly increased risk for recurrence without radiation therapy if she were to choose this approach.  The patient wishes to be aggressive with her postoperative management and does wish to proceed with radiation therapy as part of her overall management. Given the patient's breast contour in size I do believe she would be a candidate for hypo- fractionated radiation therapy over approximately 3-1/2 weeks.  Patient does wish to proceed with this treatment regimen for her radiation therapy management.  She will proceed with adjuvant hormonal therapy after she completes her radiation treatment.  PLAN: Simulation and planning July 23 with treatments to begin approximately 6 weeks postop.  I spent 60 minutes minutes face to face with the patient and more than 50% of that time was spent in counseling and/or coordination of care.   ------------------------------------------------ .   Blair Promise, PhD, MD

## 2013-07-22 NOTE — Progress Notes (Signed)
Please see the Nurse Progress Note in the MD Initial Consult Encounter for this patient. 

## 2013-07-23 ENCOUNTER — Ambulatory Visit
Admission: RE | Admit: 2013-07-23 | Discharge: 2013-07-23 | Disposition: A | Discharge status: Home / Self Care | Payer: Medicare Other | Source: Ambulatory Visit | Attending: Radiation Oncology | Admitting: Radiation Oncology

## 2013-07-23 DIAGNOSIS — Z170 Estrogen receptor positive status [ER+]: Secondary | ICD-10-CM | POA: Diagnosis not present

## 2013-07-23 DIAGNOSIS — E785 Hyperlipidemia, unspecified: Secondary | ICD-10-CM | POA: Diagnosis not present

## 2013-07-23 DIAGNOSIS — C50911 Malignant neoplasm of unspecified site of right female breast: Secondary | ICD-10-CM

## 2013-07-23 DIAGNOSIS — C50419 Malignant neoplasm of upper-outer quadrant of unspecified female breast: Secondary | ICD-10-CM | POA: Diagnosis not present

## 2013-07-23 DIAGNOSIS — C50919 Malignant neoplasm of unspecified site of unspecified female breast: Secondary | ICD-10-CM | POA: Diagnosis not present

## 2013-07-23 DIAGNOSIS — K219 Gastro-esophageal reflux disease without esophagitis: Secondary | ICD-10-CM | POA: Diagnosis not present

## 2013-07-23 DIAGNOSIS — Z510 Encounter for antineoplastic radiation therapy: Secondary | ICD-10-CM | POA: Diagnosis not present

## 2013-07-23 DIAGNOSIS — I10 Essential (primary) hypertension: Secondary | ICD-10-CM | POA: Diagnosis not present

## 2013-07-24 ENCOUNTER — Encounter: Payer: Self-pay | Admitting: Radiation Oncology

## 2013-07-24 NOTE — Progress Notes (Signed)
  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  Identity was confirmed.  All relevant records and images related to the planned course of therapy were reviewed.  The patient freely provided informed written consent to proceed with treatment after reviewing the details related to the planned course of therapy. The consent form was witnessed and verified by the simulation staff.  Then, the patient was set-up in a stable reproducible  supine position for radiation therapy.  CT images were obtained.  Surface markings were placed.  The CT images were loaded into the planning software.  Then the target and avoidance structures were contoured.  Treatment planning then occurred.  The radiation prescription was entered and confirmed.  Then, I designed and supervised the construction of a total of 3 medically necessary complex treatment devices.  I have requested : 3D Simulation  I have requested a DVH of the following structures: lumpectomy cavity, lungs, heart.  I have ordered:dose calc.  PLAN:  The patient will receive 42.72 Gy in 2.67 fraction.  ________________________________  -----------------------------------  Blair Promise, PhD, MD

## 2013-07-24 NOTE — Addendum Note (Signed)
Encounter addended by: Jacqulyn Liner, RN on: 07/24/2013  1:37 PM<BR>     Documentation filed: Charges VN, Visit Diagnoses

## 2013-07-27 DIAGNOSIS — E785 Hyperlipidemia, unspecified: Secondary | ICD-10-CM | POA: Diagnosis not present

## 2013-07-27 DIAGNOSIS — C50919 Malignant neoplasm of unspecified site of unspecified female breast: Secondary | ICD-10-CM | POA: Diagnosis not present

## 2013-07-27 DIAGNOSIS — Z510 Encounter for antineoplastic radiation therapy: Secondary | ICD-10-CM | POA: Diagnosis not present

## 2013-07-27 DIAGNOSIS — Z170 Estrogen receptor positive status [ER+]: Secondary | ICD-10-CM | POA: Diagnosis not present

## 2013-07-27 DIAGNOSIS — C50419 Malignant neoplasm of upper-outer quadrant of unspecified female breast: Secondary | ICD-10-CM | POA: Diagnosis not present

## 2013-07-27 DIAGNOSIS — K219 Gastro-esophageal reflux disease without esophagitis: Secondary | ICD-10-CM | POA: Diagnosis not present

## 2013-07-27 DIAGNOSIS — I10 Essential (primary) hypertension: Secondary | ICD-10-CM | POA: Diagnosis not present

## 2013-07-29 DIAGNOSIS — Z510 Encounter for antineoplastic radiation therapy: Secondary | ICD-10-CM | POA: Diagnosis not present

## 2013-07-29 DIAGNOSIS — K219 Gastro-esophageal reflux disease without esophagitis: Secondary | ICD-10-CM | POA: Diagnosis not present

## 2013-07-29 DIAGNOSIS — Z170 Estrogen receptor positive status [ER+]: Secondary | ICD-10-CM | POA: Diagnosis not present

## 2013-07-29 DIAGNOSIS — C50919 Malignant neoplasm of unspecified site of unspecified female breast: Secondary | ICD-10-CM | POA: Diagnosis not present

## 2013-07-29 DIAGNOSIS — E785 Hyperlipidemia, unspecified: Secondary | ICD-10-CM | POA: Diagnosis not present

## 2013-07-29 DIAGNOSIS — I10 Essential (primary) hypertension: Secondary | ICD-10-CM | POA: Diagnosis not present

## 2013-07-30 ENCOUNTER — Ambulatory Visit
Admission: RE | Admit: 2013-07-30 | Discharge: 2013-07-30 | Disposition: A | Discharge status: Home / Self Care | Payer: Medicare Other | Source: Ambulatory Visit | Attending: Radiation Oncology | Admitting: Radiation Oncology

## 2013-07-30 DIAGNOSIS — I10 Essential (primary) hypertension: Secondary | ICD-10-CM | POA: Diagnosis not present

## 2013-07-30 DIAGNOSIS — Z170 Estrogen receptor positive status [ER+]: Secondary | ICD-10-CM | POA: Diagnosis not present

## 2013-07-30 DIAGNOSIS — C50911 Malignant neoplasm of unspecified site of right female breast: Secondary | ICD-10-CM

## 2013-07-30 DIAGNOSIS — E785 Hyperlipidemia, unspecified: Secondary | ICD-10-CM | POA: Diagnosis not present

## 2013-07-30 DIAGNOSIS — C50919 Malignant neoplasm of unspecified site of unspecified female breast: Secondary | ICD-10-CM | POA: Diagnosis not present

## 2013-07-30 DIAGNOSIS — K219 Gastro-esophageal reflux disease without esophagitis: Secondary | ICD-10-CM | POA: Diagnosis not present

## 2013-07-30 DIAGNOSIS — C50419 Malignant neoplasm of upper-outer quadrant of unspecified female breast: Secondary | ICD-10-CM | POA: Diagnosis not present

## 2013-07-30 DIAGNOSIS — Z510 Encounter for antineoplastic radiation therapy: Secondary | ICD-10-CM | POA: Diagnosis not present

## 2013-07-30 NOTE — Progress Notes (Signed)
  Radiation Oncology         [phone] ________________________________  Name: Gabriella Walker MRN: [id number]  Date: 07/30/2013  DOB: 05-25-1940  Simulation Verification Note  Status: outpatient  NARRATIVE: The patient was brought to the treatment unit and placed in the planned treatment position. The clinical setup was verified. Then port films were obtained and uploaded to the radiation oncology medical record software.  The treatment beams were carefully compared against the planned radiation fields. The position location and shape of the radiation fields was reviewed. They targeted volume of tissue appears to be appropriately covered by the radiation beams. Organs at risk appear to be excluded as planned.  Based on my personal review, I approved the simulation verification. The patient's treatment will proceed as planned.  -----------------------------------  Blair Promise, PhD, MD

## 2013-08-03 ENCOUNTER — Ambulatory Visit
Admission: RE | Admit: 2013-08-03 | Discharge: 2013-08-03 | Disposition: A | Discharge status: Home / Self Care | Payer: Medicare Other | Source: Ambulatory Visit | Attending: Radiation Oncology | Admitting: Radiation Oncology

## 2013-08-03 DIAGNOSIS — I10 Essential (primary) hypertension: Secondary | ICD-10-CM | POA: Diagnosis not present

## 2013-08-03 DIAGNOSIS — C50919 Malignant neoplasm of unspecified site of unspecified female breast: Secondary | ICD-10-CM | POA: Diagnosis not present

## 2013-08-03 DIAGNOSIS — C50419 Malignant neoplasm of upper-outer quadrant of unspecified female breast: Secondary | ICD-10-CM | POA: Diagnosis not present

## 2013-08-03 DIAGNOSIS — K219 Gastro-esophageal reflux disease without esophagitis: Secondary | ICD-10-CM | POA: Diagnosis not present

## 2013-08-03 DIAGNOSIS — Z510 Encounter for antineoplastic radiation therapy: Secondary | ICD-10-CM | POA: Diagnosis not present

## 2013-08-03 DIAGNOSIS — Z170 Estrogen receptor positive status [ER+]: Secondary | ICD-10-CM | POA: Diagnosis not present

## 2013-08-03 DIAGNOSIS — E785 Hyperlipidemia, unspecified: Secondary | ICD-10-CM | POA: Diagnosis not present

## 2013-08-04 ENCOUNTER — Ambulatory Visit
Admission: RE | Admit: 2013-08-04 | Discharge: 2013-08-04 | Disposition: A | Discharge status: Home / Self Care | Payer: Medicare Other | Source: Ambulatory Visit | Attending: Radiation Oncology | Admitting: Radiation Oncology

## 2013-08-04 ENCOUNTER — Encounter: Payer: Self-pay | Admitting: Radiation Oncology

## 2013-08-04 VITALS — BP 130/77 | HR 58 | Resp 16 | Wt 204.1 lb

## 2013-08-04 DIAGNOSIS — Z170 Estrogen receptor positive status [ER+]: Secondary | ICD-10-CM | POA: Diagnosis not present

## 2013-08-04 DIAGNOSIS — C50419 Malignant neoplasm of upper-outer quadrant of unspecified female breast: Secondary | ICD-10-CM | POA: Diagnosis not present

## 2013-08-04 DIAGNOSIS — C50911 Malignant neoplasm of unspecified site of right female breast: Secondary | ICD-10-CM

## 2013-08-04 DIAGNOSIS — I10 Essential (primary) hypertension: Secondary | ICD-10-CM | POA: Diagnosis not present

## 2013-08-04 DIAGNOSIS — C50919 Malignant neoplasm of unspecified site of unspecified female breast: Secondary | ICD-10-CM | POA: Diagnosis not present

## 2013-08-04 DIAGNOSIS — K219 Gastro-esophageal reflux disease without esophagitis: Secondary | ICD-10-CM | POA: Diagnosis not present

## 2013-08-04 DIAGNOSIS — Z510 Encounter for antineoplastic radiation therapy: Secondary | ICD-10-CM | POA: Diagnosis not present

## 2013-08-04 DIAGNOSIS — E785 Hyperlipidemia, unspecified: Secondary | ICD-10-CM | POA: Diagnosis not present

## 2013-08-04 MED ORDER — ALRA NON-METALLIC DEODORANT (RAD-ONC)
1.0000 "application " | Freq: Once | TOPICAL | Status: AC
  Administered 2013-08-04: 1 via TOPICAL

## 2013-08-04 MED ORDER — RADIAPLEXRX EX GEL
Freq: Once | CUTANEOUS | Status: AC
  Administered 2013-08-04: 13:00:00 via TOPICAL

## 2013-08-04 NOTE — Progress Notes (Signed)
Patient reports fatigue but, denies skin changes within right breast treatment field. Oriented patient to staff and routine of the clinic. Educated patient reference potential side effects and management such as, fatigue and skin changes. Provided patient with radiaplex and alra then directed upon use. Provided patient with RADIATION THERAPY AND YOU handbook then, reviewed pertinent information. Answered questions to the best of my ability. Patient verbalized understanding of all reviewed.

## 2013-08-04 NOTE — Addendum Note (Signed)
Encounter addended by: Heywood Footman, RN on: 08/04/2013  2:07 PM<BR>     Documentation filed: Inpatient Document Flowsheet, Inpatient Patient Education

## 2013-08-04 NOTE — Addendum Note (Signed)
Encounter addended by: Heywood Footman, RN on: 08/04/2013  1:12 PM<BR>     Documentation filed: Inpatient MAR, Orders

## 2013-08-04 NOTE — Progress Notes (Signed)
Weekly Management Note Current Dose:  5.34 Gy  Projected Dose: 42.72 Gy   Narrative:  The patient presents for routine under treatment assessment.  CBCT/MVCT images/Port film x-rays were reviewed.  The chart was checked. Some fatigue. Otherwise no complaints. RN education performed.   Physical Findings: Weight: 204 lb 1.6 oz (92.579 kg). Unchanged  Impression:  The patient is tolerating radiation.  Plan:  Continue treatment as planned. Start radiaplex.

## 2013-08-05 ENCOUNTER — Ambulatory Visit
Admission: RE | Admit: 2013-08-05 | Discharge: 2013-08-05 | Disposition: A | Discharge status: Home / Self Care | Payer: Medicare Other | Source: Ambulatory Visit | Attending: Radiation Oncology | Admitting: Radiation Oncology

## 2013-08-05 DIAGNOSIS — Z170 Estrogen receptor positive status [ER+]: Secondary | ICD-10-CM | POA: Diagnosis not present

## 2013-08-05 DIAGNOSIS — C50919 Malignant neoplasm of unspecified site of unspecified female breast: Secondary | ICD-10-CM | POA: Diagnosis not present

## 2013-08-05 DIAGNOSIS — I10 Essential (primary) hypertension: Secondary | ICD-10-CM | POA: Diagnosis not present

## 2013-08-05 DIAGNOSIS — E785 Hyperlipidemia, unspecified: Secondary | ICD-10-CM | POA: Diagnosis not present

## 2013-08-05 DIAGNOSIS — C50419 Malignant neoplasm of upper-outer quadrant of unspecified female breast: Secondary | ICD-10-CM | POA: Diagnosis not present

## 2013-08-05 DIAGNOSIS — K219 Gastro-esophageal reflux disease without esophagitis: Secondary | ICD-10-CM | POA: Diagnosis not present

## 2013-08-05 DIAGNOSIS — Z510 Encounter for antineoplastic radiation therapy: Secondary | ICD-10-CM | POA: Diagnosis not present

## 2013-08-06 ENCOUNTER — Ambulatory Visit
Admission: RE | Admit: 2013-08-06 | Discharge: 2013-08-06 | Disposition: A | Discharge status: Home / Self Care | Payer: Medicare Other | Source: Ambulatory Visit | Attending: Radiation Oncology | Admitting: Radiation Oncology

## 2013-08-06 DIAGNOSIS — Z170 Estrogen receptor positive status [ER+]: Secondary | ICD-10-CM | POA: Diagnosis not present

## 2013-08-06 DIAGNOSIS — C50419 Malignant neoplasm of upper-outer quadrant of unspecified female breast: Secondary | ICD-10-CM | POA: Diagnosis not present

## 2013-08-06 DIAGNOSIS — K219 Gastro-esophageal reflux disease without esophagitis: Secondary | ICD-10-CM | POA: Diagnosis not present

## 2013-08-06 DIAGNOSIS — Z510 Encounter for antineoplastic radiation therapy: Secondary | ICD-10-CM | POA: Diagnosis not present

## 2013-08-06 DIAGNOSIS — I10 Essential (primary) hypertension: Secondary | ICD-10-CM | POA: Diagnosis not present

## 2013-08-06 DIAGNOSIS — C50919 Malignant neoplasm of unspecified site of unspecified female breast: Secondary | ICD-10-CM | POA: Diagnosis not present

## 2013-08-06 DIAGNOSIS — E785 Hyperlipidemia, unspecified: Secondary | ICD-10-CM | POA: Diagnosis not present

## 2013-08-07 ENCOUNTER — Ambulatory Visit
Admission: RE | Admit: 2013-08-07 | Discharge: 2013-08-07 | Disposition: A | Discharge status: Home / Self Care | Payer: Medicare Other | Source: Ambulatory Visit | Attending: Radiation Oncology | Admitting: Radiation Oncology

## 2013-08-07 DIAGNOSIS — Z510 Encounter for antineoplastic radiation therapy: Secondary | ICD-10-CM | POA: Diagnosis not present

## 2013-08-07 DIAGNOSIS — C50919 Malignant neoplasm of unspecified site of unspecified female breast: Secondary | ICD-10-CM | POA: Diagnosis not present

## 2013-08-07 DIAGNOSIS — Z170 Estrogen receptor positive status [ER+]: Secondary | ICD-10-CM | POA: Diagnosis not present

## 2013-08-07 DIAGNOSIS — C50419 Malignant neoplasm of upper-outer quadrant of unspecified female breast: Secondary | ICD-10-CM | POA: Diagnosis not present

## 2013-08-07 DIAGNOSIS — K219 Gastro-esophageal reflux disease without esophagitis: Secondary | ICD-10-CM | POA: Diagnosis not present

## 2013-08-07 DIAGNOSIS — I10 Essential (primary) hypertension: Secondary | ICD-10-CM | POA: Diagnosis not present

## 2013-08-07 DIAGNOSIS — E785 Hyperlipidemia, unspecified: Secondary | ICD-10-CM | POA: Diagnosis not present

## 2013-08-10 ENCOUNTER — Ambulatory Visit
Admission: RE | Admit: 2013-08-10 | Discharge: 2013-08-10 | Disposition: A | Discharge status: Home / Self Care | Payer: Medicare Other | Source: Ambulatory Visit | Attending: Radiation Oncology | Admitting: Radiation Oncology

## 2013-08-10 DIAGNOSIS — C50419 Malignant neoplasm of upper-outer quadrant of unspecified female breast: Secondary | ICD-10-CM | POA: Diagnosis not present

## 2013-08-10 DIAGNOSIS — E785 Hyperlipidemia, unspecified: Secondary | ICD-10-CM | POA: Diagnosis not present

## 2013-08-10 DIAGNOSIS — C50919 Malignant neoplasm of unspecified site of unspecified female breast: Secondary | ICD-10-CM | POA: Diagnosis not present

## 2013-08-10 DIAGNOSIS — I10 Essential (primary) hypertension: Secondary | ICD-10-CM | POA: Diagnosis not present

## 2013-08-10 DIAGNOSIS — Z170 Estrogen receptor positive status [ER+]: Secondary | ICD-10-CM | POA: Diagnosis not present

## 2013-08-10 DIAGNOSIS — K219 Gastro-esophageal reflux disease without esophagitis: Secondary | ICD-10-CM | POA: Diagnosis not present

## 2013-08-10 DIAGNOSIS — Z510 Encounter for antineoplastic radiation therapy: Secondary | ICD-10-CM | POA: Diagnosis not present

## 2013-08-11 ENCOUNTER — Ambulatory Visit
Admission: RE | Admit: 2013-08-11 | Discharge: 2013-08-11 | Disposition: A | Discharge status: Home / Self Care | Payer: Medicare Other | Source: Ambulatory Visit | Attending: Radiation Oncology | Admitting: Radiation Oncology

## 2013-08-11 ENCOUNTER — Encounter: Payer: Self-pay | Admitting: Radiation Oncology

## 2013-08-11 VITALS — BP 126/63 | HR 61 | Temp 98.2°F | Ht 63.0 in | Wt 202.4 lb

## 2013-08-11 DIAGNOSIS — K219 Gastro-esophageal reflux disease without esophagitis: Secondary | ICD-10-CM | POA: Diagnosis not present

## 2013-08-11 DIAGNOSIS — C50419 Malignant neoplasm of upper-outer quadrant of unspecified female breast: Secondary | ICD-10-CM | POA: Diagnosis not present

## 2013-08-11 DIAGNOSIS — E785 Hyperlipidemia, unspecified: Secondary | ICD-10-CM | POA: Diagnosis not present

## 2013-08-11 DIAGNOSIS — C50911 Malignant neoplasm of unspecified site of right female breast: Secondary | ICD-10-CM

## 2013-08-11 DIAGNOSIS — Z510 Encounter for antineoplastic radiation therapy: Secondary | ICD-10-CM | POA: Diagnosis not present

## 2013-08-11 DIAGNOSIS — C50919 Malignant neoplasm of unspecified site of unspecified female breast: Secondary | ICD-10-CM | POA: Diagnosis not present

## 2013-08-11 DIAGNOSIS — Z170 Estrogen receptor positive status [ER+]: Secondary | ICD-10-CM | POA: Diagnosis not present

## 2013-08-11 DIAGNOSIS — I10 Essential (primary) hypertension: Secondary | ICD-10-CM | POA: Diagnosis not present

## 2013-08-11 NOTE — Progress Notes (Signed)
Gabriella Walker has completed 7 fractions to her right breast.  She reports pain in her right forehead from when she slipped yesterday and hit her head on a door.  She reports that she is feeling fatigued in the afternoon.  The skin on her right breast is intact.  She is using radiaplex gel.

## 2013-08-11 NOTE — Progress Notes (Signed)
  Radiation Oncology         [phone] ________________________________  Name: Gabriella Walker MRN: [id number]  Date: 08/11/2013  DOB: 1940-03-05  Weekly Radiation Therapy Management  Cancer of right breast   Primary site: Breast (Right)   Staging method: AJCC 7th Edition   Clinical: Stage IA (T1, N0, cM0)   Summary: Stage IA (T1, N0, cM0)   Current Dose: 18.69 Gy     Planned Dose:  42.72 Gy  Narrative . . . . . . . . The patient presents for routine under treatment assessment.                                   The patient is without complaint except for some fatigue. She denies any itching or discomfort in the breast area.                                 Set-up films were reviewed.                                 The chart was checked. Physical Findings. . .  height is 5\' 3"  (1.6 m) and weight is 202 lb 6.4 oz (91.808 kg). Her oral temperature is 98.2 F (36.8 C). Her blood pressure is 126/63 and her pulse is 61. . Mild erythema is noted in the skin of the treatment area. Impression . . . . . . . The patient is tolerating radiation. Plan . . . . . . . . . . . . Continue treatment as planned.  ________________________________   Blair Promise, PhD, MD

## 2013-08-12 ENCOUNTER — Ambulatory Visit
Admission: RE | Admit: 2013-08-12 | Discharge: 2013-08-12 | Disposition: A | Discharge status: Home / Self Care | Payer: Medicare Other | Source: Ambulatory Visit | Attending: Radiation Oncology | Admitting: Radiation Oncology

## 2013-08-12 DIAGNOSIS — I10 Essential (primary) hypertension: Secondary | ICD-10-CM | POA: Diagnosis not present

## 2013-08-12 DIAGNOSIS — K219 Gastro-esophageal reflux disease without esophagitis: Secondary | ICD-10-CM | POA: Diagnosis not present

## 2013-08-12 DIAGNOSIS — C50919 Malignant neoplasm of unspecified site of unspecified female breast: Secondary | ICD-10-CM | POA: Diagnosis not present

## 2013-08-12 DIAGNOSIS — Z510 Encounter for antineoplastic radiation therapy: Secondary | ICD-10-CM | POA: Diagnosis not present

## 2013-08-12 DIAGNOSIS — C50419 Malignant neoplasm of upper-outer quadrant of unspecified female breast: Secondary | ICD-10-CM | POA: Diagnosis not present

## 2013-08-12 DIAGNOSIS — E785 Hyperlipidemia, unspecified: Secondary | ICD-10-CM | POA: Diagnosis not present

## 2013-08-12 DIAGNOSIS — Z170 Estrogen receptor positive status [ER+]: Secondary | ICD-10-CM | POA: Diagnosis not present

## 2013-08-13 ENCOUNTER — Ambulatory Visit
Admission: RE | Admit: 2013-08-13 | Discharge: 2013-08-13 | Disposition: A | Discharge status: Home / Self Care | Payer: Medicare Other | Source: Ambulatory Visit | Attending: Radiation Oncology | Admitting: Radiation Oncology

## 2013-08-13 DIAGNOSIS — K219 Gastro-esophageal reflux disease without esophagitis: Secondary | ICD-10-CM | POA: Diagnosis not present

## 2013-08-13 DIAGNOSIS — Z170 Estrogen receptor positive status [ER+]: Secondary | ICD-10-CM | POA: Diagnosis not present

## 2013-08-13 DIAGNOSIS — E785 Hyperlipidemia, unspecified: Secondary | ICD-10-CM | POA: Diagnosis not present

## 2013-08-13 DIAGNOSIS — Z510 Encounter for antineoplastic radiation therapy: Secondary | ICD-10-CM | POA: Diagnosis not present

## 2013-08-13 DIAGNOSIS — C50419 Malignant neoplasm of upper-outer quadrant of unspecified female breast: Secondary | ICD-10-CM | POA: Diagnosis not present

## 2013-08-13 DIAGNOSIS — I10 Essential (primary) hypertension: Secondary | ICD-10-CM | POA: Diagnosis not present

## 2013-08-13 DIAGNOSIS — C50919 Malignant neoplasm of unspecified site of unspecified female breast: Secondary | ICD-10-CM | POA: Diagnosis not present

## 2013-08-14 ENCOUNTER — Ambulatory Visit
Admission: RE | Admit: 2013-08-14 | Discharge: 2013-08-14 | Disposition: A | Discharge status: Home / Self Care | Payer: Medicare Other | Source: Ambulatory Visit | Attending: Radiation Oncology | Admitting: Radiation Oncology

## 2013-08-14 DIAGNOSIS — Z510 Encounter for antineoplastic radiation therapy: Secondary | ICD-10-CM | POA: Diagnosis not present

## 2013-08-14 DIAGNOSIS — Z170 Estrogen receptor positive status [ER+]: Secondary | ICD-10-CM | POA: Diagnosis not present

## 2013-08-14 DIAGNOSIS — E785 Hyperlipidemia, unspecified: Secondary | ICD-10-CM | POA: Diagnosis not present

## 2013-08-14 DIAGNOSIS — K219 Gastro-esophageal reflux disease without esophagitis: Secondary | ICD-10-CM | POA: Diagnosis not present

## 2013-08-14 DIAGNOSIS — I10 Essential (primary) hypertension: Secondary | ICD-10-CM | POA: Diagnosis not present

## 2013-08-14 DIAGNOSIS — C50919 Malignant neoplasm of unspecified site of unspecified female breast: Secondary | ICD-10-CM | POA: Diagnosis not present

## 2013-08-14 DIAGNOSIS — C50419 Malignant neoplasm of upper-outer quadrant of unspecified female breast: Secondary | ICD-10-CM | POA: Diagnosis not present

## 2013-08-14 NOTE — Progress Notes (Addendum)
Gabriella Walker has completed 10/16 fractions to her right breast.  She denies pain.  She reports having a tingling/burning sensation on her lips and nose that started yesterday.  She also has a sore throat that started Wednesday.  She reports having allergies and takes Allegra as needed.  Her temperature today is 98.3 degrees.  She reports that she is not taking any new medications or using any new lotions and detergents.  She has switched to using Dove unscented soap last week.  Advised her to stop using the Dove unscented soap on her face and to go back to what she was using before.  Patient stated that she does not want to see the doctor.  Advised her that radiation to her right breast would not cause a sore throat or burning to her nose and lips.  Advised her to see her primary care doctor or go to the ER if needed for the sore throat or if she develops shortness of breath over the weekend.

## 2013-08-17 ENCOUNTER — Ambulatory Visit
Admission: RE | Admit: 2013-08-17 | Discharge: 2013-08-17 | Disposition: A | Discharge status: Home / Self Care | Payer: Medicare Other | Source: Ambulatory Visit | Attending: Radiation Oncology | Admitting: Radiation Oncology

## 2013-08-17 DIAGNOSIS — C50919 Malignant neoplasm of unspecified site of unspecified female breast: Secondary | ICD-10-CM | POA: Diagnosis not present

## 2013-08-17 DIAGNOSIS — Z170 Estrogen receptor positive status [ER+]: Secondary | ICD-10-CM | POA: Diagnosis not present

## 2013-08-17 DIAGNOSIS — E785 Hyperlipidemia, unspecified: Secondary | ICD-10-CM | POA: Diagnosis not present

## 2013-08-17 DIAGNOSIS — Z510 Encounter for antineoplastic radiation therapy: Secondary | ICD-10-CM | POA: Diagnosis not present

## 2013-08-17 DIAGNOSIS — I10 Essential (primary) hypertension: Secondary | ICD-10-CM | POA: Diagnosis not present

## 2013-08-17 DIAGNOSIS — C50419 Malignant neoplasm of upper-outer quadrant of unspecified female breast: Secondary | ICD-10-CM | POA: Diagnosis not present

## 2013-08-17 DIAGNOSIS — K219 Gastro-esophageal reflux disease without esophagitis: Secondary | ICD-10-CM | POA: Diagnosis not present

## 2013-08-18 ENCOUNTER — Ambulatory Visit
Admission: RE | Admit: 2013-08-18 | Discharge: 2013-08-18 | Disposition: A | Discharge status: Home / Self Care | Payer: Medicare Other | Source: Ambulatory Visit | Attending: Radiation Oncology | Admitting: Radiation Oncology

## 2013-08-18 ENCOUNTER — Encounter: Payer: Self-pay | Admitting: Radiation Oncology

## 2013-08-18 VITALS — BP 135/65 | HR 62 | Temp 98.0°F | Resp 20 | Ht 63.0 in | Wt 203.3 lb

## 2013-08-18 DIAGNOSIS — K219 Gastro-esophageal reflux disease without esophagitis: Secondary | ICD-10-CM | POA: Diagnosis not present

## 2013-08-18 DIAGNOSIS — C50919 Malignant neoplasm of unspecified site of unspecified female breast: Secondary | ICD-10-CM | POA: Diagnosis not present

## 2013-08-18 DIAGNOSIS — E785 Hyperlipidemia, unspecified: Secondary | ICD-10-CM | POA: Diagnosis not present

## 2013-08-18 DIAGNOSIS — C50419 Malignant neoplasm of upper-outer quadrant of unspecified female breast: Secondary | ICD-10-CM | POA: Diagnosis not present

## 2013-08-18 DIAGNOSIS — I10 Essential (primary) hypertension: Secondary | ICD-10-CM | POA: Diagnosis not present

## 2013-08-18 DIAGNOSIS — C50911 Malignant neoplasm of unspecified site of right female breast: Secondary | ICD-10-CM

## 2013-08-18 DIAGNOSIS — Z510 Encounter for antineoplastic radiation therapy: Secondary | ICD-10-CM | POA: Diagnosis not present

## 2013-08-18 DIAGNOSIS — Z170 Estrogen receptor positive status [ER+]: Secondary | ICD-10-CM | POA: Diagnosis not present

## 2013-08-18 NOTE — Progress Notes (Signed)
  Radiation Oncology         (336) [phone] ________________________________  Name: Gabriella Walker MRN: [id number]  Date: 08/18/2013  DOB: [birth year]  Weekly Radiation Therapy Management  Current Dose: 32.04 Gy     Planned Dose:  42.72 Gy  Narrative . . . . . . . . The patient presents for routine under treatment assessment.                                   The patient is without complaint for occasional discomfort in the right breast area. She also reports some fatigue late in the afternoons.                                 Set-up films were reviewed.                                 The chart was checked. Physical Findings. . .  height is 5\' 3"  (1.6 m) and weight is 203 lb 4.8 oz (92.216 kg). Her oral temperature is 98 F (36.7 C). Her blood pressure is 135/65 and her pulse is 62. Her respiration is 20. .  The right breast area shows some mild hyperpigmentation changes and erythema. Impression . . . . . . . The patient is tolerating radiation. Plan . . . . . . . . . . . . Continue treatment as planned.  ________________________________   Blair Promise, PhD, MD

## 2013-08-18 NOTE — Progress Notes (Signed)
Gabriella Walker has completed 12/16 fractions to her right breast.  She denies pain except for occasional sharp pains in her right breast.  She reports a scratchy throat that she thinks may be from allergies.  Her throat is slightly red on inspection.  She reports fatigue in the afternoons.  The skin on her right breast is intact and pink. She is using radiaplex gel.

## 2013-08-19 ENCOUNTER — Ambulatory Visit
Admission: RE | Admit: 2013-08-19 | Discharge: 2013-08-19 | Disposition: A | Discharge status: Home / Self Care | Payer: Medicare Other | Source: Ambulatory Visit | Attending: Radiation Oncology | Admitting: Radiation Oncology

## 2013-08-19 DIAGNOSIS — C50919 Malignant neoplasm of unspecified site of unspecified female breast: Secondary | ICD-10-CM | POA: Diagnosis not present

## 2013-08-19 DIAGNOSIS — C50419 Malignant neoplasm of upper-outer quadrant of unspecified female breast: Secondary | ICD-10-CM | POA: Diagnosis not present

## 2013-08-19 DIAGNOSIS — I10 Essential (primary) hypertension: Secondary | ICD-10-CM | POA: Diagnosis not present

## 2013-08-19 DIAGNOSIS — Z170 Estrogen receptor positive status [ER+]: Secondary | ICD-10-CM | POA: Diagnosis not present

## 2013-08-19 DIAGNOSIS — K219 Gastro-esophageal reflux disease without esophagitis: Secondary | ICD-10-CM | POA: Diagnosis not present

## 2013-08-19 DIAGNOSIS — E785 Hyperlipidemia, unspecified: Secondary | ICD-10-CM | POA: Diagnosis not present

## 2013-08-19 DIAGNOSIS — Z510 Encounter for antineoplastic radiation therapy: Secondary | ICD-10-CM | POA: Diagnosis not present

## 2013-08-20 ENCOUNTER — Ambulatory Visit
Admission: RE | Admit: 2013-08-20 | Discharge: 2013-08-20 | Disposition: A | Discharge status: Home / Self Care | Payer: Medicare Other | Source: Ambulatory Visit | Attending: Radiation Oncology | Admitting: Radiation Oncology

## 2013-08-20 DIAGNOSIS — I10 Essential (primary) hypertension: Secondary | ICD-10-CM | POA: Diagnosis not present

## 2013-08-20 DIAGNOSIS — C50919 Malignant neoplasm of unspecified site of unspecified female breast: Secondary | ICD-10-CM | POA: Diagnosis not present

## 2013-08-20 DIAGNOSIS — Z170 Estrogen receptor positive status [ER+]: Secondary | ICD-10-CM | POA: Diagnosis not present

## 2013-08-20 DIAGNOSIS — Z510 Encounter for antineoplastic radiation therapy: Secondary | ICD-10-CM | POA: Diagnosis not present

## 2013-08-20 DIAGNOSIS — K219 Gastro-esophageal reflux disease without esophagitis: Secondary | ICD-10-CM | POA: Diagnosis not present

## 2013-08-20 DIAGNOSIS — C50419 Malignant neoplasm of upper-outer quadrant of unspecified female breast: Secondary | ICD-10-CM | POA: Diagnosis not present

## 2013-08-20 DIAGNOSIS — E785 Hyperlipidemia, unspecified: Secondary | ICD-10-CM | POA: Diagnosis not present

## 2013-08-21 ENCOUNTER — Ambulatory Visit
Admission: RE | Admit: 2013-08-21 | Discharge: 2013-08-21 | Disposition: A | Discharge status: Home / Self Care | Payer: Medicare Other | Source: Ambulatory Visit | Attending: Radiation Oncology | Admitting: Radiation Oncology

## 2013-08-21 DIAGNOSIS — E785 Hyperlipidemia, unspecified: Secondary | ICD-10-CM | POA: Diagnosis not present

## 2013-08-21 DIAGNOSIS — Z170 Estrogen receptor positive status [ER+]: Secondary | ICD-10-CM | POA: Diagnosis not present

## 2013-08-21 DIAGNOSIS — C50419 Malignant neoplasm of upper-outer quadrant of unspecified female breast: Secondary | ICD-10-CM | POA: Diagnosis not present

## 2013-08-21 DIAGNOSIS — Z510 Encounter for antineoplastic radiation therapy: Secondary | ICD-10-CM | POA: Diagnosis not present

## 2013-08-21 DIAGNOSIS — C50919 Malignant neoplasm of unspecified site of unspecified female breast: Secondary | ICD-10-CM | POA: Diagnosis not present

## 2013-08-21 DIAGNOSIS — K219 Gastro-esophageal reflux disease without esophagitis: Secondary | ICD-10-CM | POA: Diagnosis not present

## 2013-08-21 DIAGNOSIS — I10 Essential (primary) hypertension: Secondary | ICD-10-CM | POA: Diagnosis not present

## 2013-08-24 ENCOUNTER — Ambulatory Visit
Admission: RE | Admit: 2013-08-24 | Discharge: 2013-08-24 | Disposition: A | Discharge status: Home / Self Care | Payer: Medicare Other | Source: Ambulatory Visit | Attending: Radiation Oncology | Admitting: Radiation Oncology

## 2013-08-24 ENCOUNTER — Encounter: Payer: Self-pay | Admitting: Radiation Oncology

## 2013-08-24 VITALS — BP 119/62 | HR 61 | Temp 98.0°F | Ht 63.0 in | Wt 204.3 lb

## 2013-08-24 DIAGNOSIS — Z510 Encounter for antineoplastic radiation therapy: Secondary | ICD-10-CM | POA: Diagnosis not present

## 2013-08-24 DIAGNOSIS — Z170 Estrogen receptor positive status [ER+]: Secondary | ICD-10-CM | POA: Diagnosis not present

## 2013-08-24 DIAGNOSIS — C50919 Malignant neoplasm of unspecified site of unspecified female breast: Secondary | ICD-10-CM | POA: Diagnosis not present

## 2013-08-24 DIAGNOSIS — C50911 Malignant neoplasm of unspecified site of right female breast: Secondary | ICD-10-CM

## 2013-08-24 DIAGNOSIS — E785 Hyperlipidemia, unspecified: Secondary | ICD-10-CM | POA: Diagnosis not present

## 2013-08-24 DIAGNOSIS — K219 Gastro-esophageal reflux disease without esophagitis: Secondary | ICD-10-CM | POA: Diagnosis not present

## 2013-08-24 DIAGNOSIS — I10 Essential (primary) hypertension: Secondary | ICD-10-CM | POA: Diagnosis not present

## 2013-08-24 DIAGNOSIS — C50419 Malignant neoplasm of upper-outer quadrant of unspecified female breast: Secondary | ICD-10-CM | POA: Diagnosis not present

## 2013-08-24 NOTE — Progress Notes (Signed)
Weekly Management Note:  Site: Right breast  Current Dose:  4272  cGy Projected Dose: 4272  cGy  Narrative: The patient is seen today for routine under treatment assessment. CBCT/MVCT images/port films were reviewed. The chart was reviewed.   She completes her radiation therapy today. She does have slight pruritus along the right inframammary region. She had an episode of tachycardia over the weekend; she's had these episodes previously. The last episode was in July.  Physical Examination:  Filed Vitals:   08/24/13 1135  BP: 119/62  Pulse: 61  Temp: 98 F (36.7 C)  .  Weight: 204 lb 4.8 oz (92.67 kg). There is mild erythema the right breast which is more intense along the inframammary region with his dry desquamation. No moist desquamation at this time.  Impression: Radiation therapy well tolerated.  Plan: Followup visit with Dr. Sondra Come in one month.

## 2013-08-24 NOTE — Progress Notes (Signed)
Gabriella Walker has completed treatment with 16 fractions to her right breast.  She denies pain but continues to have a scratchy, sore throat.  She thinks it is due to allergies.  She reports having fatigue.  She reports having a rapid heart rate yesterday which is not a new issue for her.  She takes metoprolol for this.  Her heart rate today was 61.  The skin on her right breast is red.  She is using radiaplex.  She reports having spots that are itchy under her right breast.  Advised her that she can use hydrocortisone cream on the itchy spots and to continue to use radiaplex for 2 weeks and then to switch to a vitamin E lotion or cocoa butter.

## 2013-08-28 DIAGNOSIS — L719 Rosacea, unspecified: Secondary | ICD-10-CM | POA: Diagnosis not present

## 2013-09-11 ENCOUNTER — Encounter: Payer: Self-pay | Admitting: Radiation Oncology

## 2013-09-11 NOTE — Progress Notes (Signed)
  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   The patient experienced some mild discomfort within the right breast and mild fatigue  Plan: The patient has completed radiation treatment. The patient will return to radiation oncology clinic for routine followup in one month. I advised them to call or return sooner if they have any questions or concerns related to their recovery or treatment.  -----------------------------------  Blair Promise, PhD, MD

## 2013-09-16 ENCOUNTER — Telehealth: Payer: Self-pay | Admitting: Oncology

## 2013-09-16 ENCOUNTER — Other Ambulatory Visit: Payer: Medicare Other

## 2013-09-16 ENCOUNTER — Encounter: Payer: Self-pay | Admitting: Oncology

## 2013-09-16 ENCOUNTER — Ambulatory Visit: Payer: Medicare Other | Admitting: Oncology

## 2013-09-16 ENCOUNTER — Other Ambulatory Visit (HOSPITAL_BASED_OUTPATIENT_CLINIC_OR_DEPARTMENT_OTHER): Payer: Medicare Other

## 2013-09-16 ENCOUNTER — Ambulatory Visit (HOSPITAL_BASED_OUTPATIENT_CLINIC_OR_DEPARTMENT_OTHER): Payer: Medicare Other | Admitting: Oncology

## 2013-09-16 VITALS — BP 132/53 | HR 58 | Temp 99.0°F | Resp 18 | Ht 63.0 in | Wt 203.7 lb

## 2013-09-16 DIAGNOSIS — C50919 Malignant neoplasm of unspecified site of unspecified female breast: Secondary | ICD-10-CM

## 2013-09-16 DIAGNOSIS — M810 Age-related osteoporosis without current pathological fracture: Secondary | ICD-10-CM

## 2013-09-16 DIAGNOSIS — C50911 Malignant neoplasm of unspecified site of right female breast: Secondary | ICD-10-CM

## 2013-09-16 DIAGNOSIS — Z170 Estrogen receptor positive status [ER+]: Secondary | ICD-10-CM

## 2013-09-16 LAB — COMPREHENSIVE METABOLIC PANEL (CC13)
ALT: 16 U/L (ref 0–55)
AST: 18 U/L (ref 5–34)
Albumin: 3.8 g/dL (ref 3.5–5.0)
Alkaline Phosphatase: 112 U/L (ref 40–150)
Anion Gap: 11 mEq/L (ref 3–11)
BUN: 17.8 mg/dL (ref 7.0–26.0)
CO2: 27 mEq/L (ref 22–29)
CREATININE: 0.9 mg/dL (ref 0.6–1.1)
Calcium: 9.7 mg/dL (ref 8.4–10.4)
Chloride: 100 mEq/L (ref 98–109)
Glucose: 103 mg/dl (ref 70–140)
Potassium: 4.3 mEq/L (ref 3.5–5.1)
Sodium: 138 mEq/L (ref 136–145)
TOTAL PROTEIN: 7.2 g/dL (ref 6.4–8.3)
Total Bilirubin: 0.62 mg/dL (ref 0.20–1.20)

## 2013-09-16 LAB — CBC WITH DIFFERENTIAL/PLATELET
BASO%: 0.6 % (ref 0.0–2.0)
Basophils Absolute: 0.1 10*3/uL (ref 0.0–0.1)
EOS%: 2.9 % (ref 0.0–7.0)
Eosinophils Absolute: 0.2 10*3/uL (ref 0.0–0.5)
HCT: 40.8 % (ref 34.8–46.6)
HGB: 13.4 g/dL (ref 11.6–15.9)
LYMPH%: 20.1 % (ref 14.0–49.7)
MCH: 27.5 pg (ref 25.1–34.0)
MCHC: 32.9 g/dL (ref 31.5–36.0)
MCV: 83.5 fL (ref 79.5–101.0)
MONO#: 0.6 10*3/uL (ref 0.1–0.9)
MONO%: 7.4 % (ref 0.0–14.0)
NEUT#: 5.9 10*3/uL (ref 1.5–6.5)
NEUT%: 69 % (ref 38.4–76.8)
PLATELETS: 189 10*3/uL (ref 145–400)
RBC: 4.89 10*6/uL (ref 3.70–5.45)
RDW: 13.7 % (ref 11.2–14.5)
WBC: 8.5 10*3/uL (ref 3.9–10.3)
lymph#: 1.7 10*3/uL (ref 0.9–3.3)

## 2013-09-16 MED ORDER — ANASTROZOLE 1 MG PO TABS: 1.0000 mg | ORAL_TABLET | Freq: Every day | ORAL | Status: DC

## 2013-09-16 NOTE — Progress Notes (Signed)
Hematology and Oncology Follow Up Visit  Gabriella Walker [id number] 1940/02/26 73 y.o. 09/16/2013 1:26 PM [name], MDShaw, [name], MD   Principle Diagnosis: 73 year old woman with invasive 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 Her tumor is ER PR positive HER-2 negative. She has a low Ki-67. Her pathological staging is T1cN0. Diagnosed in April 2015.     Prior Therapy:  She is S/P right lumpectomy on 05/29/2013. 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 Invasive carcinoma was noted in the anterior margin. The tumor was ER positive, PR positive HER-2 negative. Her Ki-67 was 5%. Her sentinel lymph node biopsy was negative. Given her close margins, she underwent a reexcision on 06/12/2013 and the pathology was benign.  She is status post radiation therapy under the care of Dr. Freddi Che completed in August of 2015.  Current therapy: Under evaluation to start adjuvant hormone therapy.  Interim History: Ms. Gabriella Walker presents today for a followup visit. Since her last visit, she completed radiation therapy without any major complications. She did report that radiation dermatitis which have resolved now. She did report increased weight gain and some fatigue associated with her therapy. Otherwise have not had any other complications.She does not report any headaches or blurry vision or double vision. She has not reported any syncope. She does not report any seizures or alteration of mental status. She does not report any fevers or chills or sweats or weight loss. She continue to be active and lives independently and attends to activities of daily living. She does report some tenderness around her surgery site as well as in the right axilla. She does not report any chest pain shortness of breath. She had some palpitation of the past but no orthopnea or PND. Does not report any nausea or vomiting or abdominal pain. Current hematochezia or melena. The rest of her  review of systems is unremarkable.   Medications: I have reviewed the patient's current medications.  Current Outpatient Prescriptions  Medication Sig Dispense Refill  . acetaminophen (TYLENOL) 500 MG tablet Take 500 mg by mouth every 6 (six) hours as needed.      Marland Kitchen anastrozole (ARIMIDEX) 1 MG tablet Take 1 tablet (1 mg total) by mouth daily.  60 tablet  3  . Calcium Carb-Cholecalciferol (CALCIUM + D3 PO) Take 2 tablets by mouth daily.      . cholecalciferol (VITAMIN D) 1000 UNITS tablet Take 1,000 Units by mouth daily.      . fexofenadine (ALLEGRA) 180 MG tablet Take 180 mg by mouth daily.      . fluticasone (FLONASE) 50 MCG/ACT nasal spray as directed.      . furosemide (LASIX) 40 MG tablet prn      . metoprolol (LOPRESSOR) 50 MG tablet Take 1 tablet by mouth 2 (two) times daily.      . non-metallic deodorant Jethro Poling) MISC Apply 1 application topically daily as needed.      . pantoprazole (PROTONIX) 40 MG tablet Take 1 tablet 30 minutes before breakfast  30 tablet  11  . Probiotic Product (PROBIOTIC DAILY PO) Take by mouth daily.      . simvastatin (ZOCOR) 40 MG tablet Take 1 tablet by mouth daily.      . valsartan-hydrochlorothiazide (DIOVAN-HCT) 320-25 MG per tablet Take 1 tablet by mouth daily.      . Wound Cleansers (RADIAPLEX EX) Apply topically.       No current facility-administered medications for this visit.     Allergies:  Allergies  Allergen Reactions  . Prilosec Otc [Omeprazole Magnesium] Itching    Past Medical History, Surgical history, Social history, and Family History were reviewed and updated.  Physical Exam: Blood pressure 132/53, pulse 58, temperature 99 F (37.2 C), temperature source Oral, resp. rate 18, height 5' 3" (1.6 m), weight 203 lb 11.2 oz (92.398 kg), SpO2 100.00%. ECOG: 0 General appearance: alert and cooperative Head: Normocephalic, without obvious abnormality Neck: no adenopathy Lymph nodes: Cervical, supraclavicular, and axillary nodes  normal. Heart:regular rate and rhythm, S1, S2 normal, no murmur, click, rub or gallop Lung:chest clear, no wheezing, rales, normal symmetric air entry Abdomin: soft, non-tender, without masses or organomegaly EXT:no erythema, induration, or nodules   Lab Results: Lab Results  Component Value Date   WBC 8.5 09/16/2013   HGB 13.4 09/16/2013   HCT 40.8 09/16/2013   MCV 83.5 09/16/2013   PLT 189 09/16/2013     Chemistry      Component Value Date/Time   NA 138 05/26/2013 1030   K 4.6 05/26/2013 1030   CL 99 05/26/2013 1030   CO2 27 05/26/2013 1030   BUN 18 05/26/2013 1030   CREATININE 0.77 05/26/2013 1030      Component Value Date/Time   CALCIUM 9.5 05/26/2013 1030         Impression and Plan:   73 year old woman with:  1.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 She is status post right lumpectomy and her sentinel lymph node biopsy was benign. Her tumor is ER PR positive HER-2 negative. She has a low Ki-67. Her pathological staging is T1cN0. She is status post radiation therapy that was completed without any issues. She declined systemic chemotherapy previously.  The role of adjuvant hormonal therapy was discussed with the patient again today. I estimated her risk of relapse improved by at least a 10% by using adjuvant aromatase inhibitors. Risks and benefits of Arimidex was discussed. Complications that includes arthralgias, myalgias, osteoporosis and a controversial area cardiovascular effects were discussed. I explained to her the rationale of using adjuvant hormone therapy for at least 5 years based on multiple adjuvant endocrine trials. Written information about this medication was given to the patient and she is willing to proceed.   2. Osteoporosis: We'll continue to monitor this especially with the start of Arimidex.  3. Followup: Will be in 6 weeks to assess any complications related to this medication.         Zola Button, MD 9/16/20151:26 PM

## 2013-09-16 NOTE — Telephone Encounter (Signed)
Pt confirmed labs/ov per 09/16 POF, gave pt AVS....KJ °

## 2013-09-21 ENCOUNTER — Encounter: Payer: Self-pay | Admitting: Oncology

## 2013-09-24 ENCOUNTER — Encounter: Payer: Self-pay | Admitting: Radiation Oncology

## 2013-09-24 ENCOUNTER — Ambulatory Visit
Admission: RE | Admit: 2013-09-24 | Discharge: 2013-09-24 | Disposition: A | Discharge status: Home / Self Care | Payer: Medicare Other | Source: Ambulatory Visit | Attending: Radiation Oncology | Admitting: Radiation Oncology

## 2013-09-24 VITALS — BP 136/58 | HR 54 | Temp 98.3°F | Resp 20 | Ht 63.0 in | Wt 203.0 lb

## 2013-09-24 DIAGNOSIS — C50911 Malignant neoplasm of unspecified site of right female breast: Secondary | ICD-10-CM

## 2013-09-24 NOTE — Progress Notes (Signed)
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  She continues to have some fatigue as well as some soreness within the nipple area. She denies any bleeding from the breast or drainage. She has started Arimidex and is tolerating this medication well.                              ALLERGIES:  is allergic to prilosec otc.  Meds: Current Outpatient Prescriptions  Medication Sig Dispense Refill  . acetaminophen (TYLENOL) 500 MG tablet Take 500 mg by mouth every 6 (six) hours as needed.      Marland Kitchen anastrozole (ARIMIDEX) 1 MG tablet Take 1 tablet (1 mg total) by mouth daily.  60 tablet  3  . Calcium Carb-Cholecalciferol (CALCIUM + D3 PO) Take 2 tablets by mouth daily.      . cholecalciferol (VITAMIN D) 1000 UNITS tablet Take 1,000 Units by mouth daily.      . fexofenadine (ALLEGRA) 180 MG tablet Take 180 mg by mouth daily.      . fluticasone (FLONASE) 50 MCG/ACT nasal spray as directed.      . furosemide (LASIX) 40 MG tablet prn      . pantoprazole (PROTONIX) 40 MG tablet Take 1 tablet 30 minutes before breakfast  30 tablet  11  . Probiotic Product (PROBIOTIC DAILY PO) Take by mouth daily.      . simvastatin (ZOCOR) 40 MG tablet Take 1 tablet by mouth daily.      . valsartan-hydrochlorothiazide (DIOVAN-HCT) 320-25 MG per tablet Take 1 tablet by mouth daily.      . metoprolol (LOPRESSOR) 50 MG tablet Take 1 tablet by mouth 2 (two) times daily.      . non-metallic deodorant Jethro Poling) MISC Apply 1 application topically daily as needed.      . Wound Cleansers (RADIAPLEX EX) Apply topically.       No current facility-administered medications  for this encounter.    Physical Findings: The patient is in no acute distress. Patient is alert and oriented.  height is 5\' 3"  (1.6 m) and weight is 203 lb (92.08 kg). Her oral temperature is 98.3 F (36.8 C). Her blood pressure is 136/58 and her pulse is 54. Her respiration is 20. Marland Kitchen  No palpable subclavicular or axillary adenopathy. The lungs are clear to auscultation. The heart has a regular rhythm and rate. Examination of the left breast reveals no mass or nipple discharge.  examination of the right breast reveals some hyperpigmentation changes and mild dry desquamation centrally within the breast area no dominant masses appreciated breast nipple discharge or bleeding. Patient has some edema in the breast consistent with radiation affect.  Lab Findings: Lab Results  Component Value Date   WBC 8.5 09/16/2013   HGB 13.4 09/16/2013   HCT 40.8 09/16/2013   MCV 83.5 09/16/2013   PLT 189 09/16/2013      Radiographic Findings: No results found.  Impression:  The patient is recovering from the effects of radiation.  No signs of recurrence on clinical exam today  Plan: Routine followup in 6 months.  ____________________________________ Blair Promise, MD

## 2013-09-24 NOTE — Progress Notes (Signed)
Follow up s/p radiation treatments  right breast 08/03/13-08/24/13, well healed under inframmary  Fold, nipple area dicoloration and peeled, dry, soreness in breast,using vitamin e cream applied to area 2x day, , no pain, appetite good, energy level down,  11:41 AM

## 2013-10-12 DIAGNOSIS — I10 Essential (primary) hypertension: Secondary | ICD-10-CM | POA: Diagnosis not present

## 2013-10-12 DIAGNOSIS — E785 Hyperlipidemia, unspecified: Secondary | ICD-10-CM | POA: Diagnosis not present

## 2013-10-12 DIAGNOSIS — R7301 Impaired fasting glucose: Secondary | ICD-10-CM | POA: Diagnosis not present

## 2013-10-19 DIAGNOSIS — Z0000 Encounter for general adult medical examination without abnormal findings: Secondary | ICD-10-CM | POA: Diagnosis not present

## 2013-10-19 DIAGNOSIS — Z1389 Encounter for screening for other disorder: Secondary | ICD-10-CM | POA: Diagnosis not present

## 2013-10-19 DIAGNOSIS — C50919 Malignant neoplasm of unspecified site of unspecified female breast: Secondary | ICD-10-CM | POA: Diagnosis not present

## 2013-10-19 DIAGNOSIS — Z86010 Personal history of colonic polyps: Secondary | ICD-10-CM | POA: Diagnosis not present

## 2013-10-19 DIAGNOSIS — R7301 Impaired fasting glucose: Secondary | ICD-10-CM | POA: Diagnosis not present

## 2013-10-19 DIAGNOSIS — Z1212 Encounter for screening for malignant neoplasm of rectum: Secondary | ICD-10-CM | POA: Diagnosis not present

## 2013-10-19 DIAGNOSIS — R8299 Other abnormal findings in urine: Secondary | ICD-10-CM | POA: Diagnosis not present

## 2013-10-19 DIAGNOSIS — Z6834 Body mass index (BMI) 34.0-34.9, adult: Secondary | ICD-10-CM | POA: Diagnosis not present

## 2013-10-19 DIAGNOSIS — E785 Hyperlipidemia, unspecified: Secondary | ICD-10-CM | POA: Diagnosis not present

## 2013-10-19 DIAGNOSIS — I10 Essential (primary) hypertension: Secondary | ICD-10-CM | POA: Diagnosis not present

## 2013-10-19 DIAGNOSIS — Z23 Encounter for immunization: Secondary | ICD-10-CM | POA: Diagnosis not present

## 2013-10-28 ENCOUNTER — Other Ambulatory Visit (HOSPITAL_BASED_OUTPATIENT_CLINIC_OR_DEPARTMENT_OTHER): Payer: Medicare Other

## 2013-10-28 ENCOUNTER — Ambulatory Visit (HOSPITAL_BASED_OUTPATIENT_CLINIC_OR_DEPARTMENT_OTHER): Payer: Medicare Other | Admitting: Oncology

## 2013-10-28 ENCOUNTER — Telehealth: Payer: Self-pay | Admitting: Oncology

## 2013-10-28 VITALS — BP 123/60 | HR 56 | Temp 98.3°F | Resp 18 | Ht 63.0 in | Wt 201.9 lb

## 2013-10-28 DIAGNOSIS — M810 Age-related osteoporosis without current pathological fracture: Secondary | ICD-10-CM

## 2013-10-28 DIAGNOSIS — C50811 Malignant neoplasm of overlapping sites of right female breast: Secondary | ICD-10-CM

## 2013-10-28 DIAGNOSIS — Z170 Estrogen receptor positive status [ER+]: Secondary | ICD-10-CM

## 2013-10-28 DIAGNOSIS — C50911 Malignant neoplasm of unspecified site of right female breast: Secondary | ICD-10-CM

## 2013-10-28 LAB — CBC WITH DIFFERENTIAL/PLATELET
BASO%: 0.7 % (ref 0.0–2.0)
BASOS ABS: 0.1 10*3/uL (ref 0.0–0.1)
EOS%: 2.8 % (ref 0.0–7.0)
Eosinophils Absolute: 0.2 10*3/uL (ref 0.0–0.5)
HEMATOCRIT: 40.1 % (ref 34.8–46.6)
HEMOGLOBIN: 13.4 g/dL (ref 11.6–15.9)
LYMPH%: 22.8 % (ref 14.0–49.7)
MCH: 27.6 pg (ref 25.1–34.0)
MCHC: 33.5 g/dL (ref 31.5–36.0)
MCV: 82.4 fL (ref 79.5–101.0)
MONO#: 0.6 10*3/uL (ref 0.1–0.9)
MONO%: 7.1 % (ref 0.0–14.0)
NEUT#: 6 10*3/uL (ref 1.5–6.5)
NEUT%: 66.6 % (ref 38.4–76.8)
PLATELETS: 193 10*3/uL (ref 145–400)
RBC: 4.87 10*6/uL (ref 3.70–5.45)
RDW: 13.6 % (ref 11.2–14.5)
WBC: 9 10*3/uL (ref 3.9–10.3)
lymph#: 2 10*3/uL (ref 0.9–3.3)

## 2013-10-28 LAB — COMPREHENSIVE METABOLIC PANEL (CC13)
ALT: 18 U/L (ref 0–55)
ANION GAP: 9 meq/L (ref 3–11)
AST: 16 U/L (ref 5–34)
Albumin: 3.7 g/dL (ref 3.5–5.0)
Alkaline Phosphatase: 93 U/L (ref 40–150)
BILIRUBIN TOTAL: 0.46 mg/dL (ref 0.20–1.20)
BUN: 14.2 mg/dL (ref 7.0–26.0)
CALCIUM: 9.6 mg/dL (ref 8.4–10.4)
CO2: 26 mEq/L (ref 22–29)
Chloride: 103 mEq/L (ref 98–109)
Creatinine: 0.8 mg/dL (ref 0.6–1.1)
Glucose: 111 mg/dl (ref 70–140)
Potassium: 4 mEq/L (ref 3.5–5.1)
Sodium: 137 mEq/L (ref 136–145)
Total Protein: 6.7 g/dL (ref 6.4–8.3)

## 2013-10-28 NOTE — Telephone Encounter (Signed)
gv pt appt schedule for jan 2016 °

## 2013-10-28 NOTE — Progress Notes (Signed)
Hematology and Oncology Follow Up Visit  Gabriella Walker [id number] Mar 23, 1940 73 y.o. 10/28/2013 10:09 AM Gabriella Walker, MDShaw, [name], MD   Principle Diagnosis: 73 year old woman with invasive 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 Her tumor is ER PR positive HER-2 negative. She has a low Ki-67. Her pathological staging is T1cN0. Diagnosed in April 2015.    Prior Therapy:  She is S/P right lumpectomy on 05/29/2013. 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 Invasive carcinoma was noted in the anterior margin. The tumor was ER positive, PR positive HER-2 negative. Her Ki-67 was 5%. Her sentinel lymph node biopsy was negative. Given her close margins, she underwent a reexcision on 06/12/2013 and the pathology was benign.  She is status post radiation therapy under the care of Dr. Freddi Walker completed in August of 2015.  Current therapy: Arimidex 1 mg daily started in September 2015.  Interim History: Ms. Gabriella Walker presents today for a followup visit. Since her last visit, she started Arimidex without any major complications. She did not report any nausea, vomiting or arthralgia. She did report increased weight gain and some fatigue which is improving. She also stopped Protonix after starting Arimidex to rule out possible interactions. She has not reported any dyspepsia or reflux type symptoms. She does not report any headaches or blurry vision or double vision. She has not reported any syncope. She does not report any seizures or alteration of mental status. She does not report any fevers or chills or sweats or weight loss. She continue to be active and lives independently and attends to activities of daily living.  She does not report any chest pain shortness of breath. She had some palpitation of the past but no orthopnea or PND. Does not report any hematochezia or melena. The rest of her review of systems is unremarkable.   Medications: I have reviewed the  patient's current medications.  Current Outpatient Prescriptions  Medication Sig Dispense Refill  . acetaminophen (TYLENOL) 500 MG tablet Take 500 mg by mouth every 6 (six) hours as needed.      Marland Kitchen anastrozole (ARIMIDEX) 1 MG tablet Take 1 tablet (1 mg total) by mouth daily.  60 tablet  3  . Calcium Carb-Cholecalciferol (CALCIUM + D3 PO) Take 2 tablets by mouth daily.      . cholecalciferol (VITAMIN D) 1000 UNITS tablet Take 1,000 Units by mouth daily.      . fexofenadine (ALLEGRA) 180 MG tablet Take 180 mg by mouth daily.      . fluticasone (FLONASE) 50 MCG/ACT nasal spray as directed.      . furosemide (LASIX) 40 MG tablet prn      . metoprolol (LOPRESSOR) 50 MG tablet Take 1 tablet by mouth 2 (two) times daily.      . metroNIDAZOLE (METROCREAM) 0.75 % cream       . non-metallic deodorant (ALRA) MISC Apply 1 application topically daily as needed.      . pantoprazole (PROTONIX) 40 MG tablet Take 1 tablet 30 minutes before breakfast  30 tablet  11  . Probiotic Product (PROBIOTIC DAILY PO) Take by mouth daily.      . simvastatin (ZOCOR) 40 MG tablet Take 1 tablet by mouth daily.      . valsartan-hydrochlorothiazide (DIOVAN-HCT) 320-25 MG per tablet Take 1 tablet by mouth daily.      . Wound Cleansers (RADIAPLEX EX) Apply topically.       No current facility-administered medications for this visit.  Allergies:  Allergies  Allergen Reactions  . Prilosec Otc [Omeprazole Magnesium] Itching    Past Medical History, Surgical history, Social history, and Family History were reviewed and updated.  Physical Exam: Blood pressure 123/60, pulse 56, temperature 98.3 F (36.8 C), temperature source Oral, resp. rate 18, height '5\' 3"'  (1.6 m), weight 201 lb 14.4 oz (91.581 kg). ECOG: 0 General appearance: alert and cooperative Head: Normocephalic, without obvious abnormality Neck: no adenopathy Lymph nodes: Cervical, supraclavicular, and axillary nodes normal. Heart:regular rate and rhythm,  S1, S2 normal, no murmur, click, rub or gallop Lung:chest clear, no wheezing, rales, normal symmetric air entry Abdomin: soft, non-tender, without masses or organomegaly EXT:no erythema, induration, or nodules   Lab Results: Lab Results  Component Value Date   WBC 9.0 10/28/2013   HGB 13.4 10/28/2013   HCT 40.1 10/28/2013   MCV 82.4 10/28/2013   PLT 193 10/28/2013     Chemistry      Component Value Date/Time   NA 138 09/16/2013 1253   NA 138 05/26/2013 1030   K 4.3 09/16/2013 1253   K 4.6 05/26/2013 1030   CL 99 05/26/2013 1030   CO2 27 09/16/2013 1253   CO2 27 05/26/2013 1030   BUN 17.8 09/16/2013 1253   BUN 18 05/26/2013 1030   CREATININE 0.9 09/16/2013 1253   CREATININE 0.77 05/26/2013 1030      Component Value Date/Time   CALCIUM 9.7 09/16/2013 1253   CALCIUM 9.5 05/26/2013 1030   ALKPHOS 112 09/16/2013 1253   AST 18 09/16/2013 1253   ALT 16 09/16/2013 1253   BILITOT 0.62 09/16/2013 1253         Impression and Plan:   73 year old woman with:  1.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 She is status post right lumpectomy and her sentinel lymph node biopsy was benign. Her tumor is ER PR positive HER-2 negative. She has a low Ki-67. Her pathological staging is T1cN0. She is status post radiation therapy that was completed without any issues. She declined systemic chemotherapy previously.  She is currently on Arimidex and tolerated it well I plan on keeping her for at least 5-10 years.   2. Osteoporosis: We will continue to monitor this especially with the start of Arimidex.  3. Followup: Will be in 12 weeks to assess any complications related to this medication.         [facility], MD 10/28/201510:09 AM

## 2013-12-04 DIAGNOSIS — C50912 Malignant neoplasm of unspecified site of left female breast: Secondary | ICD-10-CM | POA: Diagnosis not present

## 2014-01-28 ENCOUNTER — Telehealth: Payer: Self-pay | Admitting: Oncology

## 2014-01-28 ENCOUNTER — Ambulatory Visit (HOSPITAL_BASED_OUTPATIENT_CLINIC_OR_DEPARTMENT_OTHER): Payer: Medicare Other | Admitting: Oncology

## 2014-01-28 ENCOUNTER — Other Ambulatory Visit (HOSPITAL_BASED_OUTPATIENT_CLINIC_OR_DEPARTMENT_OTHER): Payer: Medicare Other

## 2014-01-28 VITALS — BP 126/56 | HR 65 | Temp 97.7°F | Resp 18 | Ht 63.0 in | Wt 204.4 lb

## 2014-01-28 DIAGNOSIS — Z170 Estrogen receptor positive status [ER+]: Secondary | ICD-10-CM | POA: Diagnosis not present

## 2014-01-28 DIAGNOSIS — C50911 Malignant neoplasm of unspecified site of right female breast: Secondary | ICD-10-CM

## 2014-01-28 DIAGNOSIS — M810 Age-related osteoporosis without current pathological fracture: Secondary | ICD-10-CM | POA: Diagnosis not present

## 2014-01-28 LAB — CBC WITH DIFFERENTIAL/PLATELET
BASO%: 1.1 % (ref 0.0–2.0)
Basophils Absolute: 0.1 10*3/uL (ref 0.0–0.1)
EOS%: 3.3 % (ref 0.0–7.0)
Eosinophils Absolute: 0.3 10*3/uL (ref 0.0–0.5)
HEMATOCRIT: 40.6 % (ref 34.8–46.6)
HGB: 13.3 g/dL (ref 11.6–15.9)
LYMPH%: 23.7 % (ref 14.0–49.7)
MCH: 27.2 pg (ref 25.1–34.0)
MCHC: 32.7 g/dL (ref 31.5–36.0)
MCV: 83.1 fL (ref 79.5–101.0)
MONO#: 0.7 10*3/uL (ref 0.1–0.9)
MONO%: 7.7 % (ref 0.0–14.0)
NEUT%: 64.2 % (ref 38.4–76.8)
NEUTROS ABS: 5.6 10*3/uL (ref 1.5–6.5)
Platelets: 187 10*3/uL (ref 145–400)
RBC: 4.89 10*6/uL (ref 3.70–5.45)
RDW: 13.9 % (ref 11.2–14.5)
WBC: 8.8 10*3/uL (ref 3.9–10.3)
lymph#: 2.1 10*3/uL (ref 0.9–3.3)

## 2014-01-28 LAB — COMPREHENSIVE METABOLIC PANEL (CC13)
ALK PHOS: 96 U/L (ref 40–150)
ALT: 14 U/L (ref 0–55)
AST: 16 U/L (ref 5–34)
Albumin: 3.6 g/dL (ref 3.5–5.0)
Anion Gap: 11 mEq/L (ref 3–11)
BUN: 17.3 mg/dL (ref 7.0–26.0)
CHLORIDE: 103 meq/L (ref 98–109)
CO2: 27 mEq/L (ref 22–29)
Calcium: 9.3 mg/dL (ref 8.4–10.4)
Creatinine: 0.8 mg/dL (ref 0.6–1.1)
EGFR: 68 mL/min/{1.73_m2} — ABNORMAL LOW (ref >90)
GLUCOSE: 104 mg/dL (ref 70–140)
Potassium: 4.1 mEq/L (ref 3.5–5.1)
Sodium: 140 mEq/L (ref 136–145)
Total Bilirubin: 0.59 mg/dL (ref 0.20–1.20)
Total Protein: 6.8 g/dL (ref 6.4–8.3)

## 2014-01-28 NOTE — Progress Notes (Signed)
Hematology and Oncology Follow Up Visit  Gabriella Walker [id number] [birth year] 74 y.o. 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 Her tumor is ER PR positive HER-2 negative. She has a low Ki-67. Her pathological staging is T1cN0. Diagnosed in April 2015.    Prior Therapy:  She is S/P right lumpectomy on 05/29/2013. 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 Invasive carcinoma was noted in the anterior margin. The tumor was ER positive, PR positive HER-2 negative. Her Ki-67 was 5%. Her sentinel lymph node biopsy was negative. Given her close margins, she underwent a reexcision on 06/12/2013 and the pathology was benign.  She is status post radiation therapy under the care of Dr. Freddi Che completed in August of 2015.  Current therapy: Arimidex 1 mg daily started in September 2015.  Interim History: Gabriella Walker presents today for a followup visit. Since her last visit, she reports no new symptoms. She continues to tolerate  Arimidex without any major complications. She did not report any nausea, vomiting or arthralgia. She did report increased weight gain and some fatigue which has been stable. She has not reported any dyspepsia or reflux type symptoms. She does not report any headaches or blurry vision or double vision. She has not reported any syncope. She does not report any seizures or alteration of mental status. She does not report any fevers or chills or sweats or weight loss. She continue to be active and lives independently and attends to activities of daily living.  She does not report any chest pain shortness of breath. She had some palpitation of the past but no orthopnea or PND. Does not report any hematochezia or melena. The rest of her review of systems is unremarkable.   Medications: I have reviewed the patient's current medications.   Current Outpatient Prescriptions  Medication Sig Dispense Refill  . acetaminophen (TYLENOL) 500 MG tablet Take 500 mg by mouth every 6 (six) hours as needed.    Marland Kitchen anastrozole (ARIMIDEX) 1 MG tablet Take 1 tablet (1 mg total) by mouth daily. 60 tablet 3  . Calcium Carb-Cholecalciferol (CALCIUM + D3 PO) Take 2 tablets by mouth daily.    . cholecalciferol (VITAMIN D) 1000 UNITS tablet Take 1,000 Units by mouth daily.    . fexofenadine (ALLEGRA) 180 MG tablet Take 180 mg by mouth daily.    . fluticasone (FLONASE) 50 MCG/ACT nasal spray as directed.    . furosemide (LASIX) 40 MG tablet prn    . metoprolol (LOPRESSOR) 50 MG tablet Take 1 tablet by mouth 2 (two) times daily.    . Probiotic Product (PROBIOTIC DAILY PO) Take by mouth daily.    . simvastatin (ZOCOR) 40 MG tablet Take 1 tablet by mouth daily.    . valsartan-hydrochlorothiazide (DIOVAN-HCT) 320-25 MG per tablet Take 1 tablet by mouth daily.     No current facility-administered medications for this visit.     Allergies:  Allergies  Allergen Reactions  . Prilosec Otc [Omeprazole Magnesium] Itching    Past Medical History, Surgical history, Social history, and Family History were reviewed and updated.  Physical Exam: Blood pressure 126/56, pulse 65, temperature 97.7 F (36.5 C), temperature source Oral, resp. rate 18, height '5\' 3"'  (1.6 m), weight 204 lb 6.4 oz (92.715 kg), SpO2 95 %. ECOG: 0 General appearance: alert and cooperative Head: Normocephalic, without obvious abnormality Neck: no adenopathy Lymph nodes: Cervical, supraclavicular,  and axillary nodes normal. Heart:regular rate and rhythm, S1, S2 normal, no murmur, click, rub or gallop Lung:chest clear, no wheezing, rales, normal symmetric air entry Abdomin: soft, non-tender, without masses or organomegaly EXT:no erythema, induration, or nodules   Lab Results: Lab Results  Component Value Date   WBC 8.8 01/28/2014   HGB 13.3 01/28/2014   HCT 40.6 01/28/2014    MCV 83.1 01/28/2014   PLT 187 01/28/2014     Chemistry      Component Value Date/Time   NA 137 10/28/2013 0934   NA 138 05/26/2013 1030   K 4.0 10/28/2013 0934   K 4.6 05/26/2013 1030   CL 99 05/26/2013 1030   CO2 26 10/28/2013 0934   CO2 27 05/26/2013 1030   BUN 14.2 10/28/2013 0934   BUN 18 05/26/2013 1030   CREATININE 0.8 10/28/2013 0934   CREATININE 0.77 05/26/2013 1030      Component Value Date/Time   CALCIUM 9.6 10/28/2013 0934   CALCIUM 9.5 05/26/2013 1030   ALKPHOS 93 10/28/2013 0934   AST 16 10/28/2013 0934   ALT 18 10/28/2013 0934   BILITOT 0.46 10/28/2013 0934         Impression and Plan:   74 year old woman with:  1.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 She is status post right lumpectomy and her sentinel lymph node biopsy was benign. Her tumor is ER PR positive HER-2 negative. She has a low Ki-67. Her pathological staging is T1cN0. She is status post radiation therapy that was completed without any issues. She declined systemic chemotherapy previously.  She is currently on Arimidex and tolerated it well I plan on keeping her for at least 5-10 years. Her next mammogram scheduled for April 2016 which she will get done appropriately.   2. Osteoporosis: We will continue to monitor this especially with the start of Arimidex.  3. Followup: Will be in 6 months to assess any complications related to this medication.         [facility], MD 1/28/201610:26 AM

## 2014-01-28 NOTE — Telephone Encounter (Signed)
Gave avs & calendar for July °

## 2014-03-09 ENCOUNTER — Other Ambulatory Visit: Payer: Self-pay | Admitting: Oncology

## 2014-03-09 DIAGNOSIS — Z853 Personal history of malignant neoplasm of breast: Secondary | ICD-10-CM

## 2014-03-09 DIAGNOSIS — Z9889 Other specified postprocedural states: Secondary | ICD-10-CM

## 2014-03-11 ENCOUNTER — Encounter: Payer: Self-pay | Admitting: Radiation Oncology

## 2014-03-11 ENCOUNTER — Ambulatory Visit
Admission: RE | Admit: 2014-03-11 | Discharge: 2014-03-11 | Disposition: A | Discharge status: Home / Self Care | Payer: Medicare Other | Source: Ambulatory Visit | Attending: Radiation Oncology | Admitting: Radiation Oncology

## 2014-03-11 VITALS — BP 146/65 | HR 66 | Temp 98.2°F | Resp 20 | Ht 63.0 in | Wt 204.6 lb

## 2014-03-11 DIAGNOSIS — C50911 Malignant neoplasm of unspecified site of right female breast: Secondary | ICD-10-CM

## 2014-03-11 NOTE — Progress Notes (Signed)
Radiation Oncology         [phone] ________________________________  Name: Gabriella Walker MRN: [id number]  Date: 03/11/2014  DOB: April 07, 1940  Follow-Up Visit Note  CC: [name], MD  Excell Seltzer, MD    ICD-9-CM ICD-10-CM   1. 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  She continues to have some soreness in the central breast area. She denies any nipple bleeding. She has noticed some crusting in the nipple area. She has an inverted nipple since her surgery. Patient also has noticed some discomfort in the axillary area. She denies any problems with swelling in her right arm or hand. Patient continues on Arimidex and seems to be tolerating this well. She has chronic pain in her knees and left hip area which she relates to arthritis.                              ALLERGIES:  is allergic to prilosec otc.  Meds: Current Outpatient Prescriptions  Medication Sig Dispense Refill  . acetaminophen (TYLENOL) 500 MG tablet Take 500 mg by mouth every 6 (six) hours as needed.    Marland Kitchen anastrozole (ARIMIDEX) 1 MG tablet Take 1 tablet (1 mg total) by mouth daily. 60 tablet 3  . Calcium Carb-Cholecalciferol (CALCIUM + D3 PO) Take 2 tablets by mouth daily.    . cholecalciferol (VITAMIN D) 1000 UNITS tablet Take 1,000 Units by mouth daily.    . fexofenadine (ALLEGRA) 180 MG tablet Take 180 mg by mouth daily.    . fluticasone (FLONASE) 50 MCG/ACT nasal spray as directed.    . furosemide (LASIX) 40 MG tablet prn    . metoprolol (LOPRESSOR) 50 MG tablet Take 1 tablet by mouth 2 (two) times daily.    . Probiotic Product (PROBIOTIC DAILY PO) Take by mouth daily.    . simvastatin (ZOCOR) 40 MG tablet Take 1 tablet by mouth daily.    . valsartan-hydrochlorothiazide (DIOVAN-HCT) 320-25 MG per tablet Take 1  tablet by mouth daily.    . VESICARE 5 MG tablet      No current facility-administered medications for this encounter.    Physical Findings: The patient is in no acute distress. Patient is alert and oriented.  height is 5\' 3"  (1.6 m) and weight is 204 lb 9.6 oz (92.806 kg). Her oral temperature is 98.2 F (36.8 C). Her blood pressure is 146/65 and her pulse is 66. Her respiration is 20. Marland Kitchen  No palpable subclavicular or axillary adenopathy. I'm unable to palpate a nodule or cyst in the right axilla. The lungs are clear to auscultation. The heart has a regular rhythm and rate. Examination of the left breast reveals no mass or nipple discharge. Examination of the right breast reveals some mild hyperpigmentation changes and mild edema. The right nipple is inverted. No dominant mass showed in the breast nipple discharge or bleeding.  Lab Findings: Lab Results  Component Value Date   WBC 8.8 01/28/2014   HGB 13.3 01/28/2014   HCT 40.6 01/28/2014   MCV 83.1 01/28/2014   PLT 187 01/28/2014    Radiographic Findings: No results found.  Impression:  No evidence of recurrence on clinical exam today  Plan:  When necessary follow-up in  radiation oncology. Patient continue close follow-up in medical oncology continue on Arimidex for several years. She is also scheduled for routine mammography next month.  ____________________________________ Blair Promise, MD

## 2014-03-11 NOTE — Progress Notes (Signed)
Gabriella Walker here for follow up.  She reports stinging pains that last about 30 minutes off and on in her right breast.  She also reports a sore spot under her right arm.  She also reports pain in her knee and hip joints.  She continues to take Arimidex. The skin on her right breast and underarm is intact. She reports fatigue.  BP 146/65 mmHg  Pulse 66  Temp(Src) 98.2 F (36.8 C) (Oral)  Resp 20  Ht 5\' 3"  (1.6 m)  Wt 204 lb 9.6 oz (92.806 kg)  BMI 36.25 kg/m2

## 2014-03-25 DIAGNOSIS — H2513 Age-related nuclear cataract, bilateral: Secondary | ICD-10-CM | POA: Diagnosis not present

## 2014-04-12 ENCOUNTER — Ambulatory Visit
Admission: RE | Admit: 2014-04-12 | Discharge: 2014-04-12 | Disposition: A | Discharge status: Home / Self Care | Payer: Medicare Other | Source: Ambulatory Visit | Attending: Oncology | Admitting: Oncology

## 2014-04-12 DIAGNOSIS — Z853 Personal history of malignant neoplasm of breast: Secondary | ICD-10-CM

## 2014-04-12 DIAGNOSIS — Z9889 Other specified postprocedural states: Secondary | ICD-10-CM

## 2014-05-05 ENCOUNTER — Other Ambulatory Visit: Payer: Self-pay | Admitting: Oncology

## 2014-05-08 ENCOUNTER — Other Ambulatory Visit: Payer: Self-pay | Admitting: Oncology

## 2014-06-18 DIAGNOSIS — C50912 Malignant neoplasm of unspecified site of left female breast: Secondary | ICD-10-CM | POA: Diagnosis not present

## 2014-07-02 DIAGNOSIS — M170 Bilateral primary osteoarthritis of knee: Secondary | ICD-10-CM | POA: Diagnosis not present

## 2014-07-02 DIAGNOSIS — M25661 Stiffness of right knee, not elsewhere classified: Secondary | ICD-10-CM | POA: Diagnosis not present

## 2014-07-02 DIAGNOSIS — M25662 Stiffness of left knee, not elsewhere classified: Secondary | ICD-10-CM | POA: Diagnosis not present

## 2014-07-02 DIAGNOSIS — M25552 Pain in left hip: Secondary | ICD-10-CM | POA: Diagnosis not present

## 2014-07-27 ENCOUNTER — Telehealth: Payer: Self-pay | Admitting: Oncology

## 2014-07-27 ENCOUNTER — Other Ambulatory Visit (HOSPITAL_BASED_OUTPATIENT_CLINIC_OR_DEPARTMENT_OTHER): Payer: Medicare Other

## 2014-07-27 ENCOUNTER — Ambulatory Visit (HOSPITAL_BASED_OUTPATIENT_CLINIC_OR_DEPARTMENT_OTHER): Payer: Medicare Other | Admitting: Oncology

## 2014-07-27 VITALS — BP 136/55 | HR 62 | Temp 97.6°F | Resp 18 | Ht 63.0 in | Wt 205.1 lb

## 2014-07-27 DIAGNOSIS — R5383 Other fatigue: Secondary | ICD-10-CM | POA: Diagnosis not present

## 2014-07-27 DIAGNOSIS — M810 Age-related osteoporosis without current pathological fracture: Secondary | ICD-10-CM

## 2014-07-27 DIAGNOSIS — C50911 Malignant neoplasm of unspecified site of right female breast: Secondary | ICD-10-CM

## 2014-07-27 DIAGNOSIS — C50811 Malignant neoplasm of overlapping sites of right female breast: Secondary | ICD-10-CM

## 2014-07-27 LAB — CBC WITH DIFFERENTIAL/PLATELET
BASO%: 0.6 % (ref 0.0–2.0)
Basophils Absolute: 0.1 10*3/uL (ref 0.0–0.1)
EOS%: 5.5 % (ref 0.0–7.0)
Eosinophils Absolute: 0.4 10*3/uL (ref 0.0–0.5)
HEMATOCRIT: 39.5 % (ref 34.8–46.6)
HGB: 13.4 g/dL (ref 11.6–15.9)
LYMPH%: 25.8 % (ref 14.0–49.7)
MCH: 28.1 pg (ref 25.1–34.0)
MCHC: 33.9 g/dL (ref 31.5–36.0)
MCV: 82.8 fL (ref 79.5–101.0)
MONO#: 0.5 10*3/uL (ref 0.1–0.9)
MONO%: 6.8 % (ref 0.0–14.0)
NEUT#: 4.9 10*3/uL (ref 1.5–6.5)
NEUT%: 61.3 % (ref 38.4–76.8)
PLATELETS: 171 10*3/uL (ref 145–400)
RBC: 4.77 10*6/uL (ref 3.70–5.45)
RDW: 13.4 % (ref 11.2–14.5)
WBC: 8 10*3/uL (ref 3.9–10.3)
lymph#: 2.1 10*3/uL (ref 0.9–3.3)

## 2014-07-27 LAB — COMPREHENSIVE METABOLIC PANEL (CC13)
ALT: 21 U/L (ref 0–55)
ANION GAP: 7 meq/L (ref 3–11)
AST: 20 U/L (ref 5–34)
Albumin: 3.7 g/dL (ref 3.5–5.0)
Alkaline Phosphatase: 104 U/L (ref 40–150)
BILIRUBIN TOTAL: 0.54 mg/dL (ref 0.20–1.20)
BUN: 17.9 mg/dL (ref 7.0–26.0)
CO2: 30 meq/L — AB (ref 22–29)
CREATININE: 0.9 mg/dL (ref 0.6–1.1)
Calcium: 9.7 mg/dL (ref 8.4–10.4)
Chloride: 100 mEq/L (ref 98–109)
EGFR: 66 mL/min/{1.73_m2} — AB (ref >90)
GLUCOSE: 110 mg/dL (ref 70–140)
POTASSIUM: 4 meq/L (ref 3.5–5.1)
SODIUM: 137 meq/L (ref 136–145)
TOTAL PROTEIN: 6.6 g/dL (ref 6.4–8.3)

## 2014-07-27 NOTE — Progress Notes (Signed)
Hematology and Oncology Follow Up Visit  Gabriella Walker [id number] 01-20-1940 74 y.o. 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 Her tumor is ER PR positive HER-2 negative. She has a low Ki-67. Her pathological staging is T1cN0. Diagnosed in April 2015.    Prior Therapy:  She is S/P right lumpectomy on 05/29/2013. 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 Invasive carcinoma was noted in the anterior margin. The tumor was ER positive, PR positive HER-2 negative. Her Ki-67 was 5%. Her sentinel lymph node biopsy was negative. Given her close margins, she underwent a reexcision on 06/12/2013 and the pathology was benign.  She is status post radiation therapy under the care of Dr. Sondra Come completed in August of 2015.  Current therapy: Arimidex 1 mg daily started in September 2015.  Interim History: Gabriella Walker presents today for a followup visit. Since her last visit, she continues to do very well. She continues to tolerate  Arimidex without any major complications such as nausea, vomiting or arthralgia. She did report increased weight gain and some fatigue which has been stable. She has not reported any breast masses or axillary lumps.   She has reported allergy symptoms including cough and nasal discharge. She has not reported any dyspepsia or reflux type symptoms. She does not report any headaches or blurry vision or double vision. She has not reported any syncope. She does not report any seizures or alteration of mental status. She does not report any fevers or chills or sweats or weight loss. She continue to be active and lives independently and attends to activities of daily living.  She does not report any chest pain shortness of breath. She had some palpitation of the past but no orthopnea or PND. Does not report any hematochezia or melena. The rest  of her review of systems is unremarkable.   Medications: I have reviewed the patient's current medications.  Current Outpatient Prescriptions  Medication Sig Dispense Refill  . anastrozole (ARIMIDEX) 1 MG tablet TAKE 1 TABLET BY MOUTH EVERY DAY 60 tablet 3  . Calcium Carb-Cholecalciferol (CALCIUM + D3 PO) Take 2 tablets by mouth daily.    . cholecalciferol (VITAMIN D) 1000 UNITS tablet Take 1,000 Units by mouth daily.    . fexofenadine (ALLEGRA) 180 MG tablet Take 180 mg by mouth daily.    . fluticasone (FLONASE) 50 MCG/ACT nasal spray as directed.    . furosemide (LASIX) 40 MG tablet prn    . metoprolol (LOPRESSOR) 50 MG tablet Take 1 tablet by mouth 2 (two) times daily.    . Naproxen Sodium (ALEVE) 220 MG CAPS Take 2 capsules by mouth 2 (two) times daily.    . Probiotic Product (PROBIOTIC DAILY PO) Take by mouth daily.    . simvastatin (ZOCOR) 40 MG tablet Take 1 tablet by mouth daily.    . valsartan-hydrochlorothiazide (DIOVAN-HCT) 320-25 MG per tablet Take 1 tablet by mouth daily.    . VESICARE 5 MG tablet      No current facility-administered medications for this visit.     Allergies:  Allergies  Allergen Reactions  . Prilosec Otc [Omeprazole Magnesium] Itching    Past Medical History, Surgical history, Social history, and Family History were reviewed and updated.  Physical Exam: Blood pressure 136/55, pulse 62, temperature 97.6 F (36.4 C), temperature source Oral, resp. rate 18, height _0  (1.6 m), weight 205 lb  1.6 oz (93.033 kg), SpO2 99 %. ECOG: 0 General appearance: alert and cooperative. No active distress. Head: Normocephalic, without obvious abnormality Neck: no adenopathy Lymph nodes: Cervical, supraclavicular, and axillary nodes normal. Heart:regular rate and rhythm, S1, S2 normal, no murmur, click, rub or gallop Lung:chest clear, no wheezing, rales, normal symmetric air entry Abdomin: soft, non-tender, without masses or organomegaly EXT:no erythema,  induration, or nodules   Lab Results: Lab Results  Component Value Date   WBC 8.0 07/27/2014   HGB 13.4 07/27/2014   HCT 39.5 07/27/2014   MCV 82.8 07/27/2014   PLT 171 07/27/2014     Chemistry      Component Value Date/Time   NA 140 01/28/2014 0939   NA 138 05/26/2013 1030   K 4.1 01/28/2014 0939   K 4.6 05/26/2013 1030   CL 99 05/26/2013 1030   CO2 27 01/28/2014 0939   CO2 27 05/26/2013 1030   BUN 17.3 01/28/2014 0939   BUN 18 05/26/2013 1030   CREATININE 0.8 01/28/2014 0939   CREATININE 0.77 05/26/2013 1030      Component Value Date/Time   CALCIUM 9.3 01/28/2014 0939   CALCIUM 9.5 05/26/2013 1030   ALKPHOS 96 01/28/2014 0939   AST 16 01/28/2014 0939   ALT 14 01/28/2014 0939   BILITOT 0.59 01/28/2014 0939         Impression and Plan:   74 year old woman with:  1.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 She is status post right lumpectomy and her sentinel lymph node biopsy was benign. Her tumor is ER PR positive HER-2 negative. She has a low Ki-67. Her pathological staging is T1cN0. She is status post radiation therapy that was completed without any issues. She declined systemic chemotherapy.   She is currently on Arimidex and tolerated it well I plan on keeping her for at least 5-10 years. Her last mammogram completed in May 2016 and I will be repeated annually.   2. Osteoporosis: We will continue to monitor this especially with the start of Arimidex.  3. Followup: Will be in 6 months to assess any complications related to this medication.         [facility], MD 7/26/201610:08 AM

## 2014-07-27 NOTE — Telephone Encounter (Signed)
Pt confirmed labs/ov per 07/26 POF, gave pt AVS and Calendar.... KJ °

## 2014-10-15 DIAGNOSIS — Z0000 Encounter for general adult medical examination without abnormal findings: Secondary | ICD-10-CM | POA: Diagnosis not present

## 2014-10-15 DIAGNOSIS — E785 Hyperlipidemia, unspecified: Secondary | ICD-10-CM | POA: Diagnosis not present

## 2014-10-15 DIAGNOSIS — I10 Essential (primary) hypertension: Secondary | ICD-10-CM | POA: Diagnosis not present

## 2014-10-15 DIAGNOSIS — R7301 Impaired fasting glucose: Secondary | ICD-10-CM | POA: Diagnosis not present

## 2014-10-18 ENCOUNTER — Telehealth: Payer: Self-pay | Admitting: Oncology

## 2014-10-18 NOTE — Telephone Encounter (Signed)
returned call and someone kept picking up and hanging up

## 2014-10-21 ENCOUNTER — Ambulatory Visit (HOSPITAL_BASED_OUTPATIENT_CLINIC_OR_DEPARTMENT_OTHER): Payer: Medicare Other | Admitting: Oncology

## 2014-10-21 ENCOUNTER — Telehealth: Payer: Self-pay | Admitting: Oncology

## 2014-10-21 VITALS — BP 135/56 | HR 63 | Temp 98.0°F | Resp 18 | Ht 63.0 in | Wt 204.0 lb

## 2014-10-21 DIAGNOSIS — C50911 Malignant neoplasm of unspecified site of right female breast: Secondary | ICD-10-CM

## 2014-10-21 DIAGNOSIS — C50811 Malignant neoplasm of overlapping sites of right female breast: Secondary | ICD-10-CM

## 2014-10-21 DIAGNOSIS — C50912 Malignant neoplasm of unspecified site of left female breast: Principal | ICD-10-CM

## 2014-10-21 DIAGNOSIS — M810 Age-related osteoporosis without current pathological fracture: Secondary | ICD-10-CM

## 2014-10-21 MED ORDER — EXEMESTANE 25 MG PO TABS: 25.0000 mg | ORAL_TABLET | Freq: Every day | ORAL | Status: DC

## 2014-10-21 NOTE — Telephone Encounter (Signed)
Gave adn printed appt sched and avs for pt for DEc °

## 2014-10-21 NOTE — Progress Notes (Signed)
Hematology and Oncology Follow Up Visit  Gabriella Walker [id number] [birth year] 74 y.o. 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 Her tumor is ER PR positive HER-2 negative. She has a low Ki-67. Her pathological staging is T1cN0. Diagnosed in April 2015.    Prior Therapy:  She is S/P right lumpectomy on 05/29/2013. 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 Invasive carcinoma was noted in the anterior margin. The tumor was ER positive, PR positive HER-2 negative. Her Ki-67 was 5%. Her sentinel lymph node biopsy was negative. Given her close margins, she underwent a reexcision on 06/12/2013 and the pathology was benign.  She is status post radiation therapy under the care of Dr. Sondra Come completed in August of 2015.  Current therapy: Arimidex 1 mg daily started in September 2015.  Interim History: [name] presents today for a followup visit. Since her last visit, she has reported diffuse arthralgias and myalgias that have been bothersome for her. She has reported bilateral hip pain and evaluation by orthopedics including imaging studies did not show any abnormalities. She also have having problems with her grip and wrist pain. She tried nonsteroidal anti-inflammatories without any much relief.  She continues on  Arimidex without any other complications such as nausea, vomiting. She did report increased weight gain and some fatigue which has been stable. She has not reported any breast masses or axillary lumps.   She has reported allergy symptoms including cough and nasal discharge. She has not reported any dyspepsia or reflux type symptoms. She does not report any headaches or blurry vision or double vision. She has not reported any syncope. She does not report any seizures or alteration of mental status. She does not report any fevers or chills or  sweats or weight loss. She continue to be active and lives independently and attends to activities of daily living.  She does not report any chest pain shortness of breath. She had some palpitation of the past but no orthopnea or PND. Does not report any hematochezia or melena. The rest of her review of systems is unremarkable.   Medications: I have reviewed the patient's current medications.  Current Outpatient Prescriptions  Medication Sig Dispense Refill  . Calcium Carb-Cholecalciferol (CALCIUM + D3 PO) Take 2 tablets by mouth daily.    . cholecalciferol (VITAMIN D) 1000 UNITS tablet Take 1,000 Units by mouth daily.    Marland Kitchen exemestane (AROMASIN) 25 MG tablet Take 1 tablet (25 mg total) by mouth daily after breakfast. 30 tablet 1  . fexofenadine (ALLEGRA) 180 MG tablet Take 180 mg by mouth daily.    . fluticasone (FLONASE) 50 MCG/ACT nasal spray as directed.    . furosemide (LASIX) 40 MG tablet prn    . metoprolol (LOPRESSOR) 50 MG tablet Take 1 tablet by mouth 2 (two) times daily.    . Naproxen Sodium (ALEVE) 220 MG CAPS Take 2 capsules by mouth 2 (two) times daily.    . Probiotic Product (PROBIOTIC DAILY PO) Take by mouth daily.    . simvastatin (ZOCOR) 40 MG tablet Take 1 tablet by mouth daily.    . valsartan-hydrochlorothiazide (DIOVAN-HCT) 320-25 MG per tablet Take 1 tablet by mouth daily.    . VESICARE 5 MG tablet      No current facility-administered medications for this visit.     Allergies:  Allergies  Allergen Reactions  . Prilosec Otc [Omeprazole  Magnesium] Itching    Past Medical History, Surgical history, Social history, and Family History were reviewed and updated.  Physical Exam: Blood pressure 135/56, pulse 63, temperature 98 F (36.7 C), temperature source Oral, resp. rate 18, height '5\' 3"'  (1.6 m), weight 204 lb (92.534 kg), SpO2 99 %. ECOG: 0 General appearance: alert and cooperative. Well-appearing woman. Head: Normocephalic, without obvious abnormality Neck: no  adenopathy Lymph nodes: Cervical, supraclavicular, and axillary nodes normal. Heart:regular rate and rhythm, S1, S2 normal, no murmur, click, rub or gallop Lung:chest clear, no wheezing, rales, normal symmetric air entry Abdomin: soft, non-tender, without masses or organomegaly EXT:no erythema, induration, or nodules. Tender to touch on her wrists bilaterally. Neurological examination no deficits.  Lab Results: Lab Results  Component Value Date   WBC 8.0 07/27/2014   HGB 13.4 07/27/2014   HCT 39.5 07/27/2014   MCV 82.8 07/27/2014   PLT 171 07/27/2014     Chemistry      Component Value Date/Time   NA 137 07/27/2014 0938   NA 138 05/26/2013 1030   K 4.0 07/27/2014 0938   K 4.6 05/26/2013 1030   CL 99 05/26/2013 1030   CO2 30* 07/27/2014 0938   CO2 27 05/26/2013 1030   BUN 17.9 07/27/2014 0938   BUN 18 05/26/2013 1030   CREATININE 0.9 07/27/2014 0938   CREATININE 0.77 05/26/2013 1030      Component Value Date/Time   CALCIUM 9.7 07/27/2014 0938   CALCIUM 9.5 05/26/2013 1030   ALKPHOS 104 07/27/2014 0938   AST 20 07/27/2014 0938   ALT 21 07/27/2014 0938   BILITOT 0.54 07/27/2014 0938         Impression and Plan:   74 year old woman with:  1.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 She is status post right lumpectomy and her sentinel lymph node biopsy was benign. Her tumor is ER PR positive HER-2 negative. She has a low Ki-67. Her pathological staging is T1cN0. She is status post radiation therapy that was completed without any issues. She declined systemic chemotherapy.   She is currently on Arimidex and tolerated it well until the last 3-4 weeks. She hasn't reported diffuse as well as and specifically hip and wrist pain. Despite nonsteroidal anti-inflammatories her pain has been rather debilitating. I have offered her switching Arimidex to another agent and she is agreeable. Risks and benefits of Aromasin was reviewed today. And she is willing to try this  at this time. I have instructed her not to start this medication for another week or so to allow for her symptoms to resolve at this time.   2. Osteoporosis: We will continue to monitor this especially aromatase inhibitors in general.  3. Followup: Will be in 2 months to assess any complications related to this medication.         Zola Button, MD 10/20/20161:38 PM

## 2014-10-28 DIAGNOSIS — N390 Urinary tract infection, site not specified: Secondary | ICD-10-CM | POA: Diagnosis not present

## 2014-10-28 DIAGNOSIS — Z23 Encounter for immunization: Secondary | ICD-10-CM | POA: Diagnosis not present

## 2014-10-28 DIAGNOSIS — R7301 Impaired fasting glucose: Secondary | ICD-10-CM | POA: Diagnosis not present

## 2014-10-28 DIAGNOSIS — N3281 Overactive bladder: Secondary | ICD-10-CM | POA: Diagnosis not present

## 2014-10-28 DIAGNOSIS — I10 Essential (primary) hypertension: Secondary | ICD-10-CM | POA: Diagnosis not present

## 2014-10-28 DIAGNOSIS — C50919 Malignant neoplasm of unspecified site of unspecified female breast: Secondary | ICD-10-CM | POA: Diagnosis not present

## 2014-10-28 DIAGNOSIS — Z1389 Encounter for screening for other disorder: Secondary | ICD-10-CM | POA: Diagnosis not present

## 2014-10-28 DIAGNOSIS — Z0000 Encounter for general adult medical examination without abnormal findings: Secondary | ICD-10-CM | POA: Diagnosis not present

## 2014-10-28 DIAGNOSIS — E785 Hyperlipidemia, unspecified: Secondary | ICD-10-CM | POA: Diagnosis not present

## 2014-10-28 DIAGNOSIS — Z6836 Body mass index (BMI) 36.0-36.9, adult: Secondary | ICD-10-CM | POA: Diagnosis not present

## 2014-10-28 DIAGNOSIS — R609 Edema, unspecified: Secondary | ICD-10-CM | POA: Diagnosis not present

## 2014-10-28 DIAGNOSIS — E669 Obesity, unspecified: Secondary | ICD-10-CM | POA: Diagnosis not present

## 2014-10-28 DIAGNOSIS — M2550 Pain in unspecified joint: Secondary | ICD-10-CM | POA: Diagnosis not present

## 2014-11-01 DIAGNOSIS — Z1212 Encounter for screening for malignant neoplasm of rectum: Secondary | ICD-10-CM | POA: Diagnosis not present

## 2014-12-09 ENCOUNTER — Ambulatory Visit: Payer: Medicare Other | Admitting: Podiatry

## 2014-12-14 ENCOUNTER — Encounter: Payer: Self-pay | Admitting: Podiatry

## 2014-12-14 ENCOUNTER — Ambulatory Visit (INDEPENDENT_AMBULATORY_CARE_PROVIDER_SITE_OTHER): Payer: Medicare Other | Admitting: Podiatry

## 2014-12-14 ENCOUNTER — Ambulatory Visit (INDEPENDENT_AMBULATORY_CARE_PROVIDER_SITE_OTHER): Payer: Medicare Other

## 2014-12-14 DIAGNOSIS — M7662 Achilles tendinitis, left leg: Secondary | ICD-10-CM

## 2014-12-14 DIAGNOSIS — M722 Plantar fascial fibromatosis: Secondary | ICD-10-CM

## 2014-12-14 DIAGNOSIS — M7661 Achilles tendinitis, right leg: Secondary | ICD-10-CM

## 2014-12-14 MED ORDER — METHYLPREDNISOLONE 4 MG PO TBPK: ORAL_TABLET | ORAL | Status: DC

## 2014-12-14 MED ORDER — MELOXICAM 15 MG PO TABS: 15.0000 mg | ORAL_TABLET | Freq: Every day | ORAL | Status: DC

## 2014-12-14 NOTE — Patient Instructions (Signed)
Achilles Tendinitis Achilles tendinitis is inflammation of the tough, cord-like band that attaches the lower muscles of your leg to your heel (Achilles tendon). It is usually caused by overusing the tendon and joint involved.  CAUSES Achilles tendinitis can happen because of:  A sudden increase in exercise or activity (such as running).  Doing the same exercises or activities (such as jumping) over and over.  Not warming up calf muscles before exercising.  Exercising in shoes that are worn out or not made for exercise.  Having arthritis or a bone growth on the back of the heel bone. This can rub against the tendon and hurt the tendon. SIGNS AND SYMPTOMS The most common symptoms are:  Pain in the back of the leg, just above the heel. The pain usually gets worse with exercise and better with rest.  Stiffness or soreness in the back of the leg, especially in the morning.  Swelling of the skin over the Achilles tendon.  Trouble standing on tiptoe. Sometimes, an Achilles tendon tears (ruptures). Symptoms of an Achilles tendon rupture can include:  Sudden, severe pain in the back of the leg.  Trouble putting weight on the foot or walking normally. DIAGNOSIS Achilles tendinitis will be diagnosed based on symptoms and a physical examination. An X-ray may be done to check if another condition is causing your symptoms. An MRI may be ordered if your health care provider suspects you may have completely torn your tendon, which is called an Achilles tendon rupture.  TREATMENT  Achilles tendinitis usually gets better over time. It can take weeks to months to heal completely. Treatment focuses on treating the symptoms and helping the injury heal. HOME CARE INSTRUCTIONS   Rest your Achilles tendon and avoid activities that cause pain.  Apply ice to the injured area:  Put ice in a plastic bag.  Place a towel between your skin and the bag.  Leave the ice on for 20 minutes, 2-3 times a  day  Try to avoid using the tendon (other than gentle range of motion) while the tendon is painful. Do not resume use until instructed by your health care provider. Then begin use gradually. Do not increase use to the point of pain. If pain does develop, decrease use and continue the above measures. Gradually increase activities that do not cause discomfort until you achieve normal use.  Do exercises to make your calf muscles stronger and more flexible. Your health care provider or physical therapist can recommend exercises for you to do.  Wrap your ankle with an elastic bandage or other wrap. This can help keep your tendon from moving too much. Your health care provider will show you how to wrap your ankle correctly.  Only take over-the-counter or prescription medicines for pain, discomfort, or fever as directed by your health care provider. SEEK MEDICAL CARE IF:   Your pain and swelling increase or pain is uncontrolled with medicines.  You develop new, unexplained symptoms or your symptoms get worse.  You are unable to move your toes or foot.  You develop warmth and swelling in your foot.  You have an unexplained temperature. MAKE SURE YOU:   Understand these instructions.  Will watch your condition.  Will get help right away if you are not doing well or get worse.   This information is not intended to replace advice given to you by your health care provider. Make sure you discuss any questions you have with your health care provider.   Document Released:   09/27/2004 Document Revised: 01/08/2014 Document Reviewed: 07/30/2012 Elsevier Interactive Patient Education 2016 Elsevier Inc.    Plantar Fasciitis Plantar fasciitis is a painful foot condition that affects the heel. It occurs when the band of tissue that connects the toes to the heel bone (plantar fascia) becomes irritated. This can happen after exercising too much or doing other repetitive activities (overuse injury). The  pain from plantar fasciitis can range from mild irritation to severe pain that makes it difficult for you to walk or move. The pain is usually worse in the morning or after you have been sitting or lying down for a while. CAUSES This condition may be caused by:  Standing for long periods of time.  Wearing shoes that do not fit.  Doing high-impact activities, including running, aerobics, and ballet.  Being overweight.  Having an abnormal way of walking (gait).  Having tight calf muscles.  Having high arches in your feet.  Starting a new athletic activity. SYMPTOMS The main symptom of this condition is heel pain. Other symptoms include:  Pain that gets worse after activity or exercise.  Pain that is worse in the morning or after resting.  Pain that goes away after you walk for a few minutes. DIAGNOSIS This condition may be diagnosed based on your signs and symptoms. Your health care provider will also do a physical exam to check for:  A tender area on the bottom of your foot.  A high arch in your foot.  Pain when you move your foot.  Difficulty moving your foot. You may also need to have imaging studies to confirm the diagnosis. These can include:  X-rays.  Ultrasound.  MRI. TREATMENT  Treatment for plantar fasciitis depends on the severity of the condition. Your treatment may include:  Rest, ice, and over-the-counter pain medicines to manage your pain.  Exercises to stretch your calves and your plantar fascia.  A splint that holds your foot in a stretched, upward position while you sleep (night splint).  Physical therapy to relieve symptoms and prevent problems in the future.  Cortisone injections to relieve severe pain.  Extracorporeal shock wave therapy (ESWT) to stimulate damaged plantar fascia with electrical impulses. It is often used as a last resort before surgery.  Surgery, if other treatments have not worked after 12 months. HOME CARE  INSTRUCTIONS  Take medicines only as directed by your health care provider.  Avoid activities that cause pain.  Roll the bottom of your foot over a bag of ice or a bottle of cold water. Do this for 20 minutes, 3-4 times a day.  Perform simple stretches as directed by your health care provider.  Try wearing athletic shoes with air-sole or gel-sole cushions or soft shoe inserts.  Wear a night splint while sleeping, if directed by your health care provider.  Keep all follow-up appointments with your health care provider. PREVENTION   Do not perform exercises or activities that cause heel pain.  Consider finding low-impact activities if you continue to have problems.  Lose weight if you need to. The best way to prevent plantar fasciitis is to avoid the activities that aggravate your plantar fascia. SEEK MEDICAL CARE IF:  Your symptoms do not go away after treatment with home care measures.  Your pain gets worse.  Your pain affects your ability to move or do your daily activities.   This information is not intended to replace advice given to you by your health care provider. Make sure you discuss any questions you  have with your health care provider.   Document Released: 09/12/2000 Document Revised: 09/08/2014 Document Reviewed: 10/28/2013 Elsevier Interactive Patient Education [employer].

## 2014-12-14 NOTE — Progress Notes (Signed)
She presents today for follow-up of her pain to the posterior and inferior aspects of her bilateral heels. She states that it just really never got better after Dr. Blenda Mounts was treating it a few years back. She states this seems to be getting worse and is hard for me to keep up with my daily activities.  Objective: vital signs are stable she is alert and oriented 3. Pulses are strongly palpable. Neurologic sensorium is intact deep tendon reflexes are intact. Muscle strength is 5 over 5 dorsiflexion plantar flexors and inverters everters onto the musculature is intact. Orthopedic evaluation of a straight solid joints distal to the ankle for range of motion without crepitation. She has severe pain on palpation of the Achilles tendinitis insertion site which does appear to be thick and warm to the touch. She also has pain on palpation medial calcaneal tubercle on the bilateral heels. Radiographic evaluation 3 views of bilateral foot does demonstrate very large retrocalcaneal heel spurs with thickening of the Achilles tendon right greater than left. Plantar distally oriented calcaneal heel spurs and soft tissue increase in density at the plantar fascial cranial insertion sites. Cutaneous evaluation demonstrates supple well-hydrated cutis no erythema edema saline as drainage or odor.   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 Plantar fasciitis bilateral.  Plan: discussed etiology pathology conservative versus surgical therapies. Injected a small amount of dexamethasone subcutaneously just anterior to the Achilles tendons bilateral. I also injected the bilateral heels today plantarly Kenalog and local anesthetic. Started her on a Medrol Dosepak to be followed by meloxicam. We discussed appropriate shoe gear stress changes as ice therapy shoe modifications. I also I recommended ice therapy and I will follow-up with her in 1 month. We  also dispensed a plantar fascial night splint.

## 2014-12-16 ENCOUNTER — Other Ambulatory Visit (HOSPITAL_BASED_OUTPATIENT_CLINIC_OR_DEPARTMENT_OTHER): Payer: Medicare Other

## 2014-12-16 ENCOUNTER — Telehealth: Payer: Self-pay | Admitting: Oncology

## 2014-12-16 ENCOUNTER — Ambulatory Visit (HOSPITAL_BASED_OUTPATIENT_CLINIC_OR_DEPARTMENT_OTHER): Payer: Medicare Other | Admitting: Oncology

## 2014-12-16 VITALS — BP 131/68 | HR 67 | Temp 98.4°F | Resp 18 | Ht 63.0 in | Wt 212.3 lb

## 2014-12-16 DIAGNOSIS — M810 Age-related osteoporosis without current pathological fracture: Secondary | ICD-10-CM

## 2014-12-16 DIAGNOSIS — C50911 Malignant neoplasm of unspecified site of right female breast: Secondary | ICD-10-CM

## 2014-12-16 LAB — CBC WITH DIFFERENTIAL/PLATELET
BASO%: 0.1 % (ref 0.0–2.0)
Basophils Absolute: 0 10*3/uL (ref 0.0–0.1)
EOS ABS: 0 10*3/uL (ref 0.0–0.5)
EOS%: 0 % (ref 0.0–7.0)
HCT: 39.4 % (ref 34.8–46.6)
HEMOGLOBIN: 12.9 g/dL (ref 11.6–15.9)
LYMPH%: 9.1 % — ABNORMAL LOW (ref 14.0–49.7)
MCH: 27.6 pg (ref 25.1–34.0)
MCHC: 32.8 g/dL (ref 31.5–36.0)
MCV: 84 fL (ref 79.5–101.0)
MONO#: 0.7 10*3/uL (ref 0.1–0.9)
MONO%: 4.6 % (ref 0.0–14.0)
NEUT#: 14 10*3/uL — ABNORMAL HIGH (ref 1.5–6.5)
NEUT%: 86.2 % — AB (ref 38.4–76.8)
PLATELETS: 215 10*3/uL (ref 145–400)
RBC: 4.69 10*6/uL (ref 3.70–5.45)
RDW: 13.4 % (ref 11.2–14.5)
WBC: 16.2 10*3/uL — ABNORMAL HIGH (ref 3.9–10.3)
lymph#: 1.5 10*3/uL (ref 0.9–3.3)

## 2014-12-16 LAB — COMPREHENSIVE METABOLIC PANEL
ALT: 16 U/L (ref 0–55)
ANION GAP: 7 meq/L (ref 3–11)
AST: 14 U/L (ref 5–34)
Albumin: 3.6 g/dL (ref 3.5–5.0)
Alkaline Phosphatase: 87 U/L (ref 40–150)
BUN: 20.1 mg/dL (ref 7.0–26.0)
CALCIUM: 9.2 mg/dL (ref 8.4–10.4)
CHLORIDE: 104 meq/L (ref 98–109)
CO2: 24 mEq/L (ref 22–29)
Creatinine: 0.9 mg/dL (ref 0.6–1.1)
EGFR: 59 mL/min/{1.73_m2} — ABNORMAL LOW (ref >90)
Glucose: 177 mg/dl — ABNORMAL HIGH (ref 70–140)
POTASSIUM: 4.3 meq/L (ref 3.5–5.1)
Sodium: 134 mEq/L — ABNORMAL LOW (ref 136–145)
Total Bilirubin: 0.35 mg/dL (ref 0.20–1.20)
Total Protein: 6.9 g/dL (ref 6.4–8.3)

## 2014-12-16 NOTE — Progress Notes (Signed)
Hematology and Oncology Follow Up Visit  Gabriella Walker [id number] [birth year] 74 y.o. 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 Her tumor is ER PR positive HER-2 negative. She has a low Ki-67. Her pathological staging is T1cN0. Diagnosed in April 2015.    Prior Therapy:  She is S/P right lumpectomy on 05/29/2013. 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 Invasive carcinoma was noted in the anterior margin. The tumor was ER positive, PR positive HER-2 negative. Her Ki-67 was 5%. Her sentinel lymph node biopsy was negative. Given her close margins, she underwent a reexcision on 06/12/2013 and the pathology was benign.  She is status post radiation therapy under the care of Dr. Sondra Come completed in August of 2015.  Current therapy: Arimidex 1 mg daily started in September 2015. This was switched to Aromasin in October 2016 because of arthralgias and myalgias.  Interim History: [name] presents today for a followup visit. Since her last visit, she has started Aromasin and have tolerated it well. She reported her joint pain is much improved and have practically resolved. She does not report any other side effects associated with this medication. She did develop plantar fasciitis and currently on prednisone last few days. She is able to ambulate without any major difficulties.  She does not report any headaches or blurry vision or double vision. She has not reported any syncope. She does not report any seizures or alteration of mental status. She does not report any fevers or chills or sweats or weight loss.  She does not report any chest pain shortness of breath. She had some palpitation of the past but no orthopnea or PND. Does not report any hematochezia or melena. The rest of her review of systems is unremarkable.   Medications: I have reviewed  the patient's current medications.  Current Outpatient Prescriptions  Medication Sig Dispense Refill  . Calcium Carb-Cholecalciferol (CALCIUM + D3 PO) Take 2 tablets by mouth daily.    . cholecalciferol (VITAMIN D) 1000 UNITS tablet Take 1,000 Units by mouth daily.    Marland Kitchen exemestane (AROMASIN) 25 MG tablet Take 1 tablet (25 mg total) by mouth daily after breakfast. 30 tablet 1  . meloxicam (MOBIC) 15 MG tablet Take 1 tablet (15 mg total) by mouth daily. 30 tablet 3  . methylPREDNISolone (MEDROL) 4 MG TBPK tablet Tapering 6 day dose pack 21 tablet 0  . metoprolol (LOPRESSOR) 50 MG tablet Take 1 tablet by mouth 2 (two) times daily.    . Probiotic Product (PROBIOTIC DAILY PO) Take by mouth daily.    . simvastatin (ZOCOR) 40 MG tablet Take 1 tablet by mouth daily.    . valsartan-hydrochlorothiazide (DIOVAN-HCT) 320-25 MG per tablet Take 1 tablet by mouth daily.     No current facility-administered medications for this visit.     Allergies:  Allergies  Allergen Reactions  . Prilosec Otc [Omeprazole Magnesium] Itching    Past Medical History, Surgical history, Social history, and Family History were reviewed and updated.  Physical Exam: Blood pressure 131/68, pulse 67, temperature 98.4 F (36.9 C), temperature source Oral, resp. rate 18, height '5\' 3"'  (1.6 m), weight 212 lb 4.8 oz (96.299 kg), SpO2 98 %. ECOG: 0 General appearance: alert and cooperative. Not in any distress. Head: Normocephalic, without obvious abnormality no oral thrush. Neck: no adenopathy Lymph nodes: Cervical, supraclavicular, and axillary nodes normal. Heart:regular rate and  rhythm, S1, S2 normal, no murmur, click, rub or gallop Lung:chest clear, no wheezing, rales, normal symmetric air entry Abdomin: soft, non-tender, without masses or organomegaly EXT:no erythema, induration, or nodules. No joint swelling. Neurological examination no deficits.  Lab Results: Lab Results  Component Value Date   WBC 16.2*  12/16/2014   HGB 12.9 12/16/2014   HCT 39.4 12/16/2014   MCV 84.0 12/16/2014   PLT 215 12/16/2014     Chemistry      Component Value Date/Time   NA 137 07/27/2014 0938   NA 138 05/26/2013 1030   K 4.0 07/27/2014 0938   K 4.6 05/26/2013 1030   CL 99 05/26/2013 1030   CO2 30* 07/27/2014 0938   CO2 27 05/26/2013 1030   BUN 17.9 07/27/2014 0938   BUN 18 05/26/2013 1030   CREATININE 0.9 07/27/2014 0938   CREATININE 0.77 05/26/2013 1030      Component Value Date/Time   CALCIUM 9.7 07/27/2014 0938   CALCIUM 9.5 05/26/2013 1030   ALKPHOS 104 07/27/2014 0938   AST 20 07/27/2014 0938   ALT 21 07/27/2014 0938   BILITOT 0.54 07/27/2014 0938         Impression and Plan:   74 year old woman with:  1.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 She is status post right lumpectomy and her sentinel lymph node biopsy was benign. Her tumor is ER PR positive HER-2 negative. She has a low Ki-67. Her pathological staging is T1cN0. She is status post radiation therapy that was completed without any issues. She declined systemic chemotherapy.   She is currently on Aromasin after she has been switched from Arimidex. She has tolerated it well and the plan is to continue on the same dose and schedule. We can consider different agent if this problem occurs in the future.  2. Osteoporosis: We will continue to monitor this especially aromatase inhibitors in general.  3. Followup: Will be in 6 months to assess any complications related to this medication.         [facility], MD 12/15/201610:36 AM

## 2014-12-16 NOTE — Telephone Encounter (Signed)
per pof to sch pt appt-gave pt copy of avs °

## 2014-12-17 DIAGNOSIS — C50911 Malignant neoplasm of unspecified site of right female breast: Secondary | ICD-10-CM | POA: Diagnosis not present

## 2014-12-22 ENCOUNTER — Other Ambulatory Visit: Payer: Self-pay | Admitting: Oncology

## 2015-01-11 ENCOUNTER — Ambulatory Visit: Payer: Medicare Other | Admitting: Podiatry

## 2015-01-20 ENCOUNTER — Ambulatory Visit (INDEPENDENT_AMBULATORY_CARE_PROVIDER_SITE_OTHER): Payer: Medicare Other | Admitting: Podiatry

## 2015-01-20 ENCOUNTER — Encounter: Payer: Self-pay | Admitting: Podiatry

## 2015-01-20 VITALS — BP 143/64 | HR 59 | Resp 16

## 2015-01-20 DIAGNOSIS — M722 Plantar fascial fibromatosis: Secondary | ICD-10-CM | POA: Diagnosis not present

## 2015-01-20 DIAGNOSIS — M7662 Achilles tendinitis, left leg: Secondary | ICD-10-CM

## 2015-01-20 DIAGNOSIS — M7661 Achilles tendinitis, right leg: Secondary | ICD-10-CM | POA: Diagnosis not present

## 2015-01-22 NOTE — Progress Notes (Signed)
She presents today for follow-up of her Achilles tendinitis and plantar fasciitis. She states that I stillthere but is doing much better.  Objective: Vital signs are stable alert and oriented 3 mild tenderness on palpation of the Achilles tendon insertion of the left heel. No calf pain and pulses are palpable.  Assessment: Insertional Achilles tendinitis resolving plantar fasciitis left.  Plan: Injected dexamethasone history of medial aspect of the Achilles tendon left follow-up with me in 1 month.

## 2015-01-28 ENCOUNTER — Ambulatory Visit: Payer: Medicare Other | Admitting: Oncology

## 2015-01-28 ENCOUNTER — Other Ambulatory Visit: Payer: Medicare Other

## 2015-02-17 ENCOUNTER — Ambulatory Visit (INDEPENDENT_AMBULATORY_CARE_PROVIDER_SITE_OTHER): Payer: Medicare Other | Admitting: Podiatry

## 2015-02-17 ENCOUNTER — Encounter: Payer: Self-pay | Admitting: Podiatry

## 2015-02-17 VITALS — BP 102/92 | HR 86 | Resp 12

## 2015-02-17 DIAGNOSIS — M7752 Other enthesopathy of left foot: Secondary | ICD-10-CM | POA: Diagnosis not present

## 2015-02-17 DIAGNOSIS — M779 Enthesopathy, unspecified: Secondary | ICD-10-CM

## 2015-02-17 DIAGNOSIS — M7662 Achilles tendinitis, left leg: Secondary | ICD-10-CM

## 2015-02-17 DIAGNOSIS — M7661 Achilles tendinitis, right leg: Secondary | ICD-10-CM | POA: Diagnosis not present

## 2015-02-17 DIAGNOSIS — M778 Other enthesopathies, not elsewhere classified: Secondary | ICD-10-CM

## 2015-02-17 NOTE — Progress Notes (Signed)
She presents today for follow-up of her Achilles tendon on his. She states that she is approximately 95% improved. Whenever she has developed some pain along the second metatarsophalangeal joint of the left foot.  Objective: Vital signs are stable alert and oriented 3 pulses are palpable. Neurologic sensorium is intact. No open lesions or wounds. Near resolution of Achilles tendinitis right. She has pain on palpation and in range of motion of the second metatarsophalangeal joint of the left foot.  Assessment: Capsulitis second metatarsophalangeal joint left foot resolving Achilles tendinitis bilateral.  Plan: Injected second metatarsophalangeal joint with Kenalog and local anesthetic. Follow-up with her in 2 months.

## 2015-02-26 ENCOUNTER — Other Ambulatory Visit: Payer: Self-pay | Admitting: Oncology

## 2015-03-10 ENCOUNTER — Other Ambulatory Visit: Payer: Self-pay | Admitting: Oncology

## 2015-03-10 DIAGNOSIS — Z853 Personal history of malignant neoplasm of breast: Secondary | ICD-10-CM

## 2015-03-11 ENCOUNTER — Encounter: Payer: Self-pay | Admitting: *Deleted

## 2015-03-31 ENCOUNTER — Ambulatory Visit: Payer: Medicare Other | Admitting: Podiatry

## 2015-04-14 ENCOUNTER — Ambulatory Visit
Admission: RE | Admit: 2015-04-14 | Discharge: 2015-04-14 | Disposition: A | Discharge status: Home / Self Care | Payer: Medicare Other | Source: Ambulatory Visit | Attending: Oncology | Admitting: Oncology

## 2015-04-14 DIAGNOSIS — Z853 Personal history of malignant neoplasm of breast: Secondary | ICD-10-CM

## 2015-04-14 DIAGNOSIS — R921 Mammographic calcification found on diagnostic imaging of breast: Secondary | ICD-10-CM | POA: Diagnosis not present

## 2015-04-18 DIAGNOSIS — H2513 Age-related nuclear cataract, bilateral: Secondary | ICD-10-CM | POA: Diagnosis not present

## 2015-04-30 ENCOUNTER — Other Ambulatory Visit: Payer: Self-pay | Admitting: Oncology

## 2015-05-25 DIAGNOSIS — R05 Cough: Secondary | ICD-10-CM | POA: Diagnosis not present

## 2015-05-25 DIAGNOSIS — Z6837 Body mass index (BMI) 37.0-37.9, adult: Secondary | ICD-10-CM | POA: Diagnosis not present

## 2015-05-25 DIAGNOSIS — M26623 Arthralgia of bilateral temporomandibular joint: Secondary | ICD-10-CM | POA: Diagnosis not present

## 2015-05-25 DIAGNOSIS — H9203 Otalgia, bilateral: Secondary | ICD-10-CM | POA: Diagnosis not present

## 2015-06-02 DIAGNOSIS — C50911 Malignant neoplasm of unspecified site of right female breast: Secondary | ICD-10-CM | POA: Diagnosis not present

## 2015-06-15 ENCOUNTER — Other Ambulatory Visit: Payer: Self-pay | Admitting: *Deleted

## 2015-06-15 DIAGNOSIS — C50911 Malignant neoplasm of unspecified site of right female breast: Secondary | ICD-10-CM

## 2015-06-16 ENCOUNTER — Other Ambulatory Visit (HOSPITAL_BASED_OUTPATIENT_CLINIC_OR_DEPARTMENT_OTHER): Payer: Medicare Other

## 2015-06-16 ENCOUNTER — Ambulatory Visit (HOSPITAL_BASED_OUTPATIENT_CLINIC_OR_DEPARTMENT_OTHER): Payer: Medicare Other | Admitting: Oncology

## 2015-06-16 ENCOUNTER — Telehealth: Payer: Self-pay | Admitting: Oncology

## 2015-06-16 VITALS — BP 126/92 | HR 64 | Temp 98.5°F | Resp 20 | Ht 63.0 in | Wt 209.6 lb

## 2015-06-16 DIAGNOSIS — C50911 Malignant neoplasm of unspecified site of right female breast: Secondary | ICD-10-CM

## 2015-06-16 DIAGNOSIS — M810 Age-related osteoporosis without current pathological fracture: Secondary | ICD-10-CM | POA: Diagnosis not present

## 2015-06-16 LAB — CBC WITH DIFFERENTIAL/PLATELET
BASO%: 0.8 % (ref 0.0–2.0)
Basophils Absolute: 0.1 10*3/uL (ref 0.0–0.1)
EOS%: 2.6 % (ref 0.0–7.0)
Eosinophils Absolute: 0.3 10*3/uL (ref 0.0–0.5)
HEMATOCRIT: 42.5 % (ref 34.8–46.6)
HGB: 14.2 g/dL (ref 11.6–15.9)
LYMPH#: 2 10*3/uL (ref 0.9–3.3)
LYMPH%: 20.1 % (ref 14.0–49.7)
MCH: 27.8 pg (ref 25.1–34.0)
MCHC: 33.5 g/dL (ref 31.5–36.0)
MCV: 83.1 fL (ref 79.5–101.0)
MONO#: 0.7 10*3/uL (ref 0.1–0.9)
MONO%: 7.3 % (ref 0.0–14.0)
NEUT#: 6.9 10*3/uL — ABNORMAL HIGH (ref 1.5–6.5)
NEUT%: 69.2 % (ref 38.4–76.8)
Platelets: 198 10*3/uL (ref 145–400)
RBC: 5.12 10*6/uL (ref 3.70–5.45)
RDW: 13.2 % (ref 11.2–14.5)
WBC: 9.9 10*3/uL (ref 3.9–10.3)

## 2015-06-16 LAB — COMPREHENSIVE METABOLIC PANEL
ALT: 14 U/L (ref 0–55)
AST: 14 U/L (ref 5–34)
Albumin: 3.7 g/dL (ref 3.5–5.0)
Alkaline Phosphatase: 98 U/L (ref 40–150)
Anion Gap: 8 mEq/L (ref 3–11)
BUN: 13.7 mg/dL (ref 7.0–26.0)
CALCIUM: 9.7 mg/dL (ref 8.4–10.4)
CHLORIDE: 102 meq/L (ref 98–109)
CO2: 28 meq/L (ref 22–29)
CREATININE: 0.9 mg/dL (ref 0.6–1.1)
EGFR: 60 mL/min/{1.73_m2} — ABNORMAL LOW (ref >90)
GLUCOSE: 102 mg/dL (ref 70–140)
Potassium: 3.7 mEq/L (ref 3.5–5.1)
SODIUM: 138 meq/L (ref 136–145)
Total Bilirubin: 0.68 mg/dL (ref 0.20–1.20)
Total Protein: 7 g/dL (ref 6.4–8.3)

## 2015-06-16 NOTE — Progress Notes (Signed)
Hematology and Oncology Follow Up Visit  Gabriella Walker [id number] 01/26/1940 75 y.o. 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 Her tumor is ER PR positive HER-2 negative. She has a low Ki-67. Her pathological staging is T1cN0. Diagnosed in April 2015.    Prior Therapy:  She is S/P right lumpectomy on 05/29/2013. 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 Invasive carcinoma was noted in the anterior margin. The tumor was ER positive, PR positive HER-2 negative. Her Ki-67 was 5%. Her sentinel lymph node biopsy was negative. Given her close margins, she underwent a reexcision on 06/12/2013 and the pathology was benign.  She is status post radiation therapy under the care of Dr. Kinard completed in August of 2015.  Current therapy: Arimidex 1 mg daily started in September 2015. This was switched to Aromasin in October 2016 because of arthralgias and myalgias.  Interim History: Ms. Gabriella Walker presents today for a followup visit. Since her last visit, she reports no major changes in her health. She continues on Aromasin and has tolerated it well. She denied any arthralgias or myalgias. She denied any joint pain or discomfort. She is able to ambulate without any major difficulties. She denied any falls or syncope. Appetite and performance status have not changed dramatically. She continues to be on calcium and vitamin D supplements and up-to-date on her bone density scans. Her last mammogram was in April 2017.  She does not report any headaches or blurry vision or double vision. She has not reported any syncope. She does not report any seizures or alteration of mental status. She does not report any fevers or chills or sweats or weight loss.  She does not report any chest pain shortness of breath. She had some palpitation of the past but no orthopnea or PND.  Does not report any hematochezia or melena. The rest of her review of systems is unremarkable.   Medications: I have reviewed the patient's current medications.  Current Outpatient Prescriptions  Medication Sig Dispense Refill  . Calcium Carb-Cholecalciferol (CALCIUM + D3 PO) Take 2 tablets by mouth daily.    . cholecalciferol (VITAMIN D) 1000 UNITS tablet Take 1,000 Units by mouth daily.    . exemestane (AROMASIN) 25 MG tablet TAKE 1 TABLET (25 MG TOTAL) BY MOUTH DAILY AFTER BREAKFAST. 30 tablet 1  . meloxicam (MOBIC) 15 MG tablet Take 1 tablet (15 mg total) by mouth daily. 30 tablet 3  . metoprolol (LOPRESSOR) 50 MG tablet Take 1 tablet by mouth 2 (two) times daily.    . Probiotic Product (PROBIOTIC DAILY PO) Take by mouth daily.    . simvastatin (ZOCOR) 40 MG tablet Take 1 tablet by mouth daily.    . valsartan-hydrochlorothiazide (DIOVAN-HCT) 320-25 MG per tablet Take 1 tablet by mouth daily.     No current facility-administered medications for this visit.     Allergies:  Allergies  Allergen Reactions  . Prilosec Otc [Omeprazole Magnesium] Itching    Past Medical History, Surgical history, Social history, and Family History were reviewed and updated.  Physical Exam: Blood pressure 126/92, pulse 64, temperature 98.5 F (36.9 C), temperature source Oral, resp. rate 20, height 5' 3" (1.6 m), weight 209 lb 9.6 oz (95.074 kg), SpO2 98 %. ECOG: 0 General appearance: Pleasant-appearing woman without distress. Head: Normocephalic, without obvious abnormality no oral ulcers or lesions. Neck: no adenopathy Lymph nodes: Cervical, supraclavicular, and   axillary nodes normal. Heart:regular rate and rhythm, S1, S2 normal, no murmur, click, rub or gallop Lung:chest clear, no wheezing, rales, normal symmetric air entry Abdomin: soft, non-tender, without masses or organomegaly no shifting dullness or ascites. EXT:no erythema, induration, or nodules.  Neurological examination no  deficits.  Lab Results: Lab Results  Component Value Date   WBC 9.9 06/16/2015   HGB 14.2 06/16/2015   HCT 42.5 06/16/2015   MCV 83.1 06/16/2015   PLT 198 06/16/2015     Chemistry      Component Value Date/Time   NA 134* 12/16/2014 1006   NA 138 05/26/2013 1030   K 4.3 12/16/2014 1006   K 4.6 05/26/2013 1030   CL 99 05/26/2013 1030   CO2 24 12/16/2014 1006   CO2 27 05/26/2013 1030   BUN 20.1 12/16/2014 1006   BUN 18 05/26/2013 1030   CREATININE 0.9 12/16/2014 1006   CREATININE 0.77 05/26/2013 1030      Component Value Date/Time   CALCIUM 9.2 12/16/2014 1006   CALCIUM 9.5 05/26/2013 1030   ALKPHOS 87 12/16/2014 1006   AST 14 12/16/2014 1006   ALT 16 12/16/2014 1006   BILITOT 0.35 12/16/2014 1006      IMPRESSION: No mammographic evidence of malignancy.  RECOMMENDATION: Annual diagnostic mammography.  I have discussed the findings and recommendations with the patient. Results were also provided in writing at the conclusion of the visit. If applicable, a reminder letter will be sent to the patient regarding the next appointment.  BI-RADS CATEGORY 2: Benign.   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 She is status post right lumpectomy and her sentinel lymph node biopsy was benign. Her tumor is ER PR positive HER-2 negative. She has a low Ki-67. Her pathological staging is T1cN0. She is status post radiation therapy that was completed without any issues. She declined systemic chemotherapy.   She is currently on Aromasin after she has been switched from Arimidex. She reports no complications related to this medication and willing to continue at this time. Plan on duration of therapy close to 5 years. Her physical examination and laboratory data do not suggest any recurrent disease. She is up-to-date on her mammography which will be repeated in April 2018.  2. Osteoporosis: She is currently on  calcium and vitamin D supplements. Her bone density scan will be done at Dr. Shaw's office at some point this year.  3. Followup: Will be in 6 months for a clinical visit.         SHADAD,FIRAS, MD 6/15/20179:12 AM 

## 2015-06-16 NOTE — Telephone Encounter (Signed)
Gave and printed appts ched and avs for pt for DEC  °

## 2015-06-27 ENCOUNTER — Other Ambulatory Visit: Payer: Self-pay | Admitting: Oncology

## 2015-08-02 ENCOUNTER — Encounter: Payer: Self-pay | Admitting: Podiatry

## 2015-08-02 ENCOUNTER — Ambulatory Visit (INDEPENDENT_AMBULATORY_CARE_PROVIDER_SITE_OTHER): Payer: Medicare Other | Admitting: Podiatry

## 2015-08-02 VITALS — BP 126/86 | HR 87 | Resp 12

## 2015-08-02 DIAGNOSIS — M7662 Achilles tendinitis, left leg: Secondary | ICD-10-CM | POA: Diagnosis not present

## 2015-08-02 DIAGNOSIS — M7661 Achilles tendinitis, right leg: Secondary | ICD-10-CM | POA: Diagnosis not present

## 2015-08-02 DIAGNOSIS — M778 Other enthesopathies, not elsewhere classified: Secondary | ICD-10-CM

## 2015-08-02 DIAGNOSIS — M7752 Other enthesopathy of left foot: Secondary | ICD-10-CM | POA: Diagnosis not present

## 2015-08-02 DIAGNOSIS — M779 Enthesopathy, unspecified: Secondary | ICD-10-CM

## 2015-08-03 ENCOUNTER — Telehealth: Payer: Self-pay | Admitting: *Deleted

## 2015-08-03 DIAGNOSIS — M7661 Achilles tendinitis, right leg: Secondary | ICD-10-CM

## 2015-08-03 DIAGNOSIS — M7662 Achilles tendinitis, left leg: Secondary | ICD-10-CM

## 2015-08-03 NOTE — Progress Notes (Signed)
She presents today for follow-up of her Achilles tendinitis right heel and swelling to the forefoot left. She states that she would like to try something else other than injections at this point that the injections helped she feels like she needs to do something else.  Objective: Vital signs are stable she's alert and oriented 3. Pulses are palpable. She still has tenderness on palpation with a large nonpulsatile mass posterior aspect of the right heel. Left forefoot is mildly tender with tenderness on palpation of the second metatarsophalangeal joint left foot.  Assessment: Achilles tendinitis slowly resolving with forefoot capsulitis left.  Plan: I referred her to physical therapy we will discuss surgical considerations next visit.

## 2015-08-03 NOTE — Telephone Encounter (Addendum)
-----   Message from Rip Harbour, [facility] sent at 08/02/2015 11:09 AM EDT ----- Regarding: PT referral  Patient wants a PT facility in [place], so I did not put in the order. 08/03/2015-Informed pt Dr. Milinda Pointer had referred to [facility], gave contact number [phone]. Faxed to [facility].

## 2015-08-08 DIAGNOSIS — M7730 Calcaneal spur, unspecified foot: Secondary | ICD-10-CM | POA: Diagnosis not present

## 2015-08-08 DIAGNOSIS — M7661 Achilles tendinitis, right leg: Secondary | ICD-10-CM | POA: Diagnosis not present

## 2015-08-08 DIAGNOSIS — M7662 Achilles tendinitis, left leg: Secondary | ICD-10-CM | POA: Diagnosis not present

## 2015-08-10 DIAGNOSIS — M7730 Calcaneal spur, unspecified foot: Secondary | ICD-10-CM | POA: Diagnosis not present

## 2015-08-10 DIAGNOSIS — M7662 Achilles tendinitis, left leg: Secondary | ICD-10-CM | POA: Diagnosis not present

## 2015-08-10 DIAGNOSIS — M7661 Achilles tendinitis, right leg: Secondary | ICD-10-CM | POA: Diagnosis not present

## 2015-08-12 DIAGNOSIS — M7662 Achilles tendinitis, left leg: Secondary | ICD-10-CM | POA: Diagnosis not present

## 2015-08-12 DIAGNOSIS — M7730 Calcaneal spur, unspecified foot: Secondary | ICD-10-CM | POA: Diagnosis not present

## 2015-08-12 DIAGNOSIS — M7661 Achilles tendinitis, right leg: Secondary | ICD-10-CM | POA: Diagnosis not present

## 2015-08-15 DIAGNOSIS — M7661 Achilles tendinitis, right leg: Secondary | ICD-10-CM | POA: Diagnosis not present

## 2015-08-15 DIAGNOSIS — M7730 Calcaneal spur, unspecified foot: Secondary | ICD-10-CM | POA: Diagnosis not present

## 2015-08-15 DIAGNOSIS — M7662 Achilles tendinitis, left leg: Secondary | ICD-10-CM | POA: Diagnosis not present

## 2015-08-17 DIAGNOSIS — M7661 Achilles tendinitis, right leg: Secondary | ICD-10-CM | POA: Diagnosis not present

## 2015-08-17 DIAGNOSIS — M7662 Achilles tendinitis, left leg: Secondary | ICD-10-CM | POA: Diagnosis not present

## 2015-08-17 DIAGNOSIS — M7730 Calcaneal spur, unspecified foot: Secondary | ICD-10-CM | POA: Diagnosis not present

## 2015-08-22 DIAGNOSIS — M7730 Calcaneal spur, unspecified foot: Secondary | ICD-10-CM | POA: Diagnosis not present

## 2015-08-22 DIAGNOSIS — M7662 Achilles tendinitis, left leg: Secondary | ICD-10-CM | POA: Diagnosis not present

## 2015-08-22 DIAGNOSIS — M7661 Achilles tendinitis, right leg: Secondary | ICD-10-CM | POA: Diagnosis not present

## 2015-08-24 DIAGNOSIS — M7661 Achilles tendinitis, right leg: Secondary | ICD-10-CM | POA: Diagnosis not present

## 2015-08-24 DIAGNOSIS — M7662 Achilles tendinitis, left leg: Secondary | ICD-10-CM | POA: Diagnosis not present

## 2015-08-24 DIAGNOSIS — M7730 Calcaneal spur, unspecified foot: Secondary | ICD-10-CM | POA: Diagnosis not present

## 2015-08-26 DIAGNOSIS — M7662 Achilles tendinitis, left leg: Secondary | ICD-10-CM | POA: Diagnosis not present

## 2015-08-26 DIAGNOSIS — M7661 Achilles tendinitis, right leg: Secondary | ICD-10-CM | POA: Diagnosis not present

## 2015-08-26 DIAGNOSIS — M7730 Calcaneal spur, unspecified foot: Secondary | ICD-10-CM | POA: Diagnosis not present

## 2015-08-29 ENCOUNTER — Other Ambulatory Visit: Payer: Self-pay | Admitting: Oncology

## 2015-08-29 DIAGNOSIS — M7730 Calcaneal spur, unspecified foot: Secondary | ICD-10-CM | POA: Diagnosis not present

## 2015-08-29 DIAGNOSIS — M7661 Achilles tendinitis, right leg: Secondary | ICD-10-CM | POA: Diagnosis not present

## 2015-08-29 DIAGNOSIS — M7662 Achilles tendinitis, left leg: Secondary | ICD-10-CM | POA: Diagnosis not present

## 2015-09-02 DIAGNOSIS — M7661 Achilles tendinitis, right leg: Secondary | ICD-10-CM | POA: Diagnosis not present

## 2015-09-02 DIAGNOSIS — M7730 Calcaneal spur, unspecified foot: Secondary | ICD-10-CM | POA: Diagnosis not present

## 2015-09-02 DIAGNOSIS — M7662 Achilles tendinitis, left leg: Secondary | ICD-10-CM | POA: Diagnosis not present

## 2015-09-13 ENCOUNTER — Ambulatory Visit: Payer: Medicare Other | Admitting: Podiatry

## 2015-09-20 ENCOUNTER — Ambulatory Visit (INDEPENDENT_AMBULATORY_CARE_PROVIDER_SITE_OTHER): Payer: Medicare Other | Admitting: Podiatry

## 2015-09-20 ENCOUNTER — Encounter: Payer: Self-pay | Admitting: Podiatry

## 2015-09-20 DIAGNOSIS — M7752 Other enthesopathy of left foot: Secondary | ICD-10-CM | POA: Diagnosis not present

## 2015-09-20 DIAGNOSIS — M779 Enthesopathy, unspecified: Secondary | ICD-10-CM

## 2015-09-20 DIAGNOSIS — M778 Other enthesopathies, not elsewhere classified: Secondary | ICD-10-CM

## 2015-09-20 DIAGNOSIS — M7661 Achilles tendinitis, right leg: Secondary | ICD-10-CM

## 2015-09-20 NOTE — Progress Notes (Signed)
She presents today for follow-up of her severe Achilles tendinitis of the right heel. Her left foot is doing much better with a capsulitis. It is markedly painful. She's tried conservative therapies including injection and CAM walker to the right lower extremity to no avail.  Objective: Pulses are palpable. She has severe pain on palpation of the insertion site of the Achilles right heel.  Assessment: Achilles tendinitis Rule out a partial tear.  Plan: Requested an MRI or possible surgical intervention.

## 2015-09-28 ENCOUNTER — Ambulatory Visit
Admission: RE | Admit: 2015-09-28 | Discharge: 2015-09-28 | Disposition: A | Discharge status: Home / Self Care | Payer: Medicare Other | Source: Ambulatory Visit | Attending: Podiatry | Admitting: Podiatry

## 2015-09-28 DIAGNOSIS — M7661 Achilles tendinitis, right leg: Secondary | ICD-10-CM

## 2015-09-28 DIAGNOSIS — M7731 Calcaneal spur, right foot: Secondary | ICD-10-CM | POA: Diagnosis not present

## 2015-09-29 ENCOUNTER — Telehealth: Payer: Self-pay | Admitting: *Deleted

## 2015-09-29 NOTE — Telephone Encounter (Signed)
Johny Drilling pharmacist, located a foundation for assistance funds. We will still have patient complete forms for drug company assistance if needed in the future. Patient to come in today and p/u forms to complete.

## 2015-09-29 NOTE — Telephone Encounter (Signed)
Oral Chemotherapy Pharmacist Encounter   Patient successfully enrolled for copay assistance through [employer] [place] amount: $5000 Award period: 04/02/15-09/28/16 Cardholder ID: [id number] BIN: [id number] PCN: [id number] Group: [id number]  Johny Drilling, PharmD, BCPS 09/29/2015  3:06 PM Oral [facility]otherapy Clinic [phone]

## 2015-09-29 NOTE — Telephone Encounter (Signed)
Please ask the breast cancer navigators to help her obtaining this medication.

## 2015-09-29 NOTE — Telephone Encounter (Signed)
Patient calling to say she has hit the donut hole this year for medicare. Unable to afford aromasin or generic, wants to know if there is another alternative drug, that is not so expensive. Spoke with [place] in the pharmacy, they do not have samples, but she will try to find some.

## 2015-10-06 ENCOUNTER — Ambulatory Visit (INDEPENDENT_AMBULATORY_CARE_PROVIDER_SITE_OTHER): Payer: Medicare Other | Admitting: Podiatry

## 2015-10-06 DIAGNOSIS — M7661 Achilles tendinitis, right leg: Secondary | ICD-10-CM

## 2015-10-06 NOTE — Progress Notes (Signed)
She presents today for follow-up of her retrocalcaneal heel pain and for her MRI report.  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 No tear noted.  Assessment: Retrocalcaneal heel spur with calcification and hypertrophy of the Achilles tendon. Chronic pain of the Achilles tendon.  Plan: We discussed the need for surgical intervention today she declined she's already had formal physical therapy. She declines any further physical therapy. States that her diclofenac gel helps. I will follow-up with her in the near future she will consider surgery.

## 2015-10-26 DIAGNOSIS — E784 Other hyperlipidemia: Secondary | ICD-10-CM | POA: Diagnosis not present

## 2015-10-26 DIAGNOSIS — R7301 Impaired fasting glucose: Secondary | ICD-10-CM | POA: Diagnosis not present

## 2015-10-26 DIAGNOSIS — I10 Essential (primary) hypertension: Secondary | ICD-10-CM | POA: Diagnosis not present

## 2015-11-02 ENCOUNTER — Other Ambulatory Visit: Payer: Self-pay | Admitting: Oncology

## 2015-11-02 DIAGNOSIS — C50919 Malignant neoplasm of unspecified site of unspecified female breast: Secondary | ICD-10-CM | POA: Diagnosis not present

## 2015-11-02 DIAGNOSIS — Z6836 Body mass index (BMI) 36.0-36.9, adult: Secondary | ICD-10-CM | POA: Diagnosis not present

## 2015-11-02 DIAGNOSIS — N3281 Overactive bladder: Secondary | ICD-10-CM | POA: Diagnosis not present

## 2015-11-02 DIAGNOSIS — R358 Other polyuria: Secondary | ICD-10-CM | POA: Diagnosis not present

## 2015-11-02 DIAGNOSIS — I10 Essential (primary) hypertension: Secondary | ICD-10-CM | POA: Diagnosis not present

## 2015-11-02 DIAGNOSIS — Z23 Encounter for immunization: Secondary | ICD-10-CM | POA: Diagnosis not present

## 2015-11-02 DIAGNOSIS — M79671 Pain in right foot: Secondary | ICD-10-CM | POA: Diagnosis not present

## 2015-11-02 DIAGNOSIS — R7301 Impaired fasting glucose: Secondary | ICD-10-CM | POA: Diagnosis not present

## 2015-11-02 DIAGNOSIS — E784 Other hyperlipidemia: Secondary | ICD-10-CM | POA: Diagnosis not present

## 2015-11-02 DIAGNOSIS — R000 Tachycardia, unspecified: Secondary | ICD-10-CM | POA: Diagnosis not present

## 2015-11-02 DIAGNOSIS — R002 Palpitations: Secondary | ICD-10-CM | POA: Diagnosis not present

## 2015-11-02 DIAGNOSIS — N390 Urinary tract infection, site not specified: Secondary | ICD-10-CM | POA: Diagnosis not present

## 2015-11-02 DIAGNOSIS — Z1389 Encounter for screening for other disorder: Secondary | ICD-10-CM | POA: Diagnosis not present

## 2015-11-02 DIAGNOSIS — Z0000 Encounter for general adult medical examination without abnormal findings: Secondary | ICD-10-CM | POA: Diagnosis not present

## 2015-11-16 ENCOUNTER — Ambulatory Visit (INDEPENDENT_AMBULATORY_CARE_PROVIDER_SITE_OTHER): Payer: Medicare Other

## 2015-11-16 DIAGNOSIS — R000 Tachycardia, unspecified: Secondary | ICD-10-CM | POA: Diagnosis not present

## 2015-11-23 ENCOUNTER — Telehealth: Payer: Self-pay | Admitting: Interventional Cardiology

## 2015-11-23 NOTE — Telephone Encounter (Signed)
    Patient had an episode of paroxysmal AFib.  HR up to 160, but returned to NSR.  No symptoms reported.  I called the patient and she did feel some palpitations.  She feels tired for a few minutes after these episodes.  We discussed anticoagulation.  She will increase metoprolol to 50 mg BID.  She prefers to start Aspirin 325 mg daily.   Usual BP and HR is typically 120-130/75, HR 60-65.  She prefers to think about anticoagulation for a while before starting.    Jettie Booze, MD

## 2015-11-28 NOTE — Telephone Encounter (Signed)
I cant find where I have seen this pt since 10/14; would schedule new pt ov with me. Kirk Ruths

## 2015-11-28 NOTE — Telephone Encounter (Addendum)
Received [employer] report. Called, spoke with pt. Informed of critical notification on 11/23/15 at 7:24 PM (CT). Report Analysis: A-fib RVR non sustained, SR. Pt stated she felt a little lightheaded during event on 11/23/15. Pt was advised by on call doctor (Dr. Irish Lack)  to start Aspirin 325 mg daily. Will forward to Dr. Stanford Breed to advise.

## 2015-11-29 NOTE — Telephone Encounter (Signed)
Unable to reach pt or leave a message phone rings busy

## 2015-11-30 NOTE — Telephone Encounter (Signed)
Spoke with pt, Follow up scheduled  

## 2015-12-06 ENCOUNTER — Ambulatory Visit: Payer: Medicare Other | Admitting: Podiatry

## 2015-12-14 DIAGNOSIS — M722 Plantar fascial fibromatosis: Secondary | ICD-10-CM | POA: Diagnosis not present

## 2015-12-14 DIAGNOSIS — M7661 Achilles tendinitis, right leg: Secondary | ICD-10-CM | POA: Diagnosis not present

## 2015-12-14 DIAGNOSIS — M7662 Achilles tendinitis, left leg: Secondary | ICD-10-CM | POA: Diagnosis not present

## 2015-12-16 ENCOUNTER — Telehealth: Payer: Self-pay | Admitting: Oncology

## 2015-12-16 ENCOUNTER — Ambulatory Visit (HOSPITAL_BASED_OUTPATIENT_CLINIC_OR_DEPARTMENT_OTHER): Payer: Medicare Other | Admitting: Oncology

## 2015-12-16 VITALS — BP 125/50 | HR 61 | Temp 98.1°F | Resp 18 | Ht 63.0 in | Wt 205.1 lb

## 2015-12-16 DIAGNOSIS — M818 Other osteoporosis without current pathological fracture: Secondary | ICD-10-CM

## 2015-12-16 DIAGNOSIS — C50911 Malignant neoplasm of unspecified site of right female breast: Secondary | ICD-10-CM | POA: Diagnosis not present

## 2015-12-16 NOTE — Progress Notes (Signed)
Hematology and Oncology Follow Up Visit  Gabriella Walker [id number] Jul 07, 1940 75 y.o. 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 Her tumor is ER PR positive HER-2 negative. She has a low Ki-67. Her pathological staging is T1cN0. Diagnosed in April 2015.   Prior Therapy:  She is S/P right lumpectomy on 05/29/2013. 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 Invasive carcinoma was noted in the anterior margin. The tumor was ER positive, PR positive HER-2 negative. Her Ki-67 was 5%. Her sentinel lymph node biopsy was negative. Given her close margins, she underwent a reexcision on 06/12/2013 and the pathology was benign.  She is status post radiation therapy under the care of Dr. Sondra Come completed in August of 2015.  Current therapy: Arimidex 1 mg daily started in September 2015. This was switched to Aromasin in October 2016 because of arthralgias and myalgias.  Interim History: [name] presents today for a followup visit. Since her last visit, she reports doing well without any recent complaints. She continues on Aromasin and has tolerated it well. She denied any arthralgias or myalgias. She denied any joint pain or discomfort. He denied any pruritus. She continues to live independently and attends to activities of daily living. She denied any falls or syncope. Appetite and performance status have not changed dramatically.   She does not report any headaches or blurry vision or double vision. She has not reported any syncope. She does not report any seizures or alteration of mental status. She does not report any fevers or chills or sweats or weight loss.  She does not report any chest pain shortness of breath. She had some palpitation of the past but no orthopnea or PND. Does not report any hematochezia or melena. The rest of her review of systems is  unremarkable.   Medications: I have reviewed the patient's current medications.  Current Outpatient Prescriptions  Medication Sig Dispense Refill  . Calcium Carb-Cholecalciferol (CALCIUM + D3 PO) Take 2 tablets by mouth daily.    . cholecalciferol (VITAMIN D) 1000 UNITS tablet Take 1,000 Units by mouth daily.    Marland Kitchen exemestane (AROMASIN) 25 MG tablet Take 1 tablet (25 mg total) by mouth daily after breakfast. 30 tablet 4  . metoprolol (LOPRESSOR) 50 MG tablet Take 1 tablet by mouth 2 (two) times daily.    . NONFORMULARY OR COMPOUNDED ITEM Combination compound cream-[facility] Baclofen 2%, Doxepin 5%, Gabapentin 6%, Topiramate 2%, Pentoxifylline 3% 120 grams with 3 refills Apply 1-2 grams to affected area 3-4 times daily    . Probiotic Product (PROBIOTIC DAILY PO) Take by mouth daily.    . simvastatin (ZOCOR) 40 MG tablet Take 1 tablet by mouth daily.    . valsartan-hydrochlorothiazide (DIOVAN-HCT) 320-25 MG per tablet Take 1 tablet by mouth daily.     No current facility-administered medications for this visit.      Allergies:  Allergies  Allergen Reactions  . Prilosec Otc [Omeprazole Magnesium] Itching    Past Medical History, Surgical history, Social history, and Family History were reviewed and updated.  Physical Exam: Blood pressure (!) 125/50, pulse 61, temperature 98.1 F (36.7 C), temperature source Oral, resp. rate 18, height '5\' 3"'  (1.6 m), weight 205 lb 1.6 oz (93 kg), SpO2 98 %. ECOG: 0 General appearance: Well-appearing or without distress. Head: Normocephalic, without obvious abnormality no oral ulcers or lesions. Neck: no adenopathy Lymph nodes: Cervical, supraclavicular,  and axillary nodes normal. Heart:regular rate and rhythm, S1, S2 normal, no murmur, click, rub or gallop Lung:chest clear, no wheezing, rales, normal symmetric air entry Abdomin: soft, non-tender, without masses or organomegaly no rebound or guarding. EXT:no erythema, induration, or nodules.   Neurological examination no deficits.  Lab Results: Lab Results  Component Value Date   WBC 9.9 06/16/2015   HGB 14.2 06/16/2015   HCT 42.5 06/16/2015   MCV 83.1 06/16/2015   PLT 198 06/16/2015     Chemistry      Component Value Date/Time   NA 138 06/16/2015 0849   K 3.7 06/16/2015 0849   CL 99 05/26/2013 1030   CO2 28 06/16/2015 0849   BUN 13.7 06/16/2015 0849   CREATININE 0.9 06/16/2015 0849      Component Value Date/Time   CALCIUM 9.7 06/16/2015 0849   ALKPHOS 98 06/16/2015 0849   AST 14 06/16/2015 0849   ALT 14 06/16/2015 0849   BILITOT 0.68 06/16/2015 0849      I   Impression and Plan:   75 year old woman with:  1.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 She is status post right lumpectomy and her sentinel lymph node biopsy was benign. Her tumor is ER PR positive HER-2 negative. She has a low Ki-67. Her pathological staging is T1cN0. She is status post radiation therapy that was completed without any issues. She declined systemic chemotherapy.   She is currently on Aromasin after she has been switched from Arimidex. She continues to tolerate Aromasin without complications. The plan is to continue with same dose and schedule for total of 5 years. She started in September 2015.   2. Osteoporosis: She is currently on calcium and vitamin D supplements.   3. Followup: Will be in 7 months for a clinical visit.         [facility], MD 12/15/20179:51 AM

## 2015-12-16 NOTE — Telephone Encounter (Signed)
Appointments scheduled per 12/15 LOS. Patient given AVS report and calendars with future scheduled appointments. °

## 2015-12-27 NOTE — Progress Notes (Deleted)
HPI: 75 yo female for evaluation of PAF. Seen previously but not since 10/14. Echocardiogram in December of 2008 showed normal LV function. There was mild left atrial enlargement, mildly dilated aortic root and mild mitral regurgitation. Cardiac catheterization in December of 2008 showed a 30% posterior descending but otherwise no obstructive coronary disease. The LV function was normal. Monitor 11/17 showed sinus with paf, atrial flutter, pacs and pvcs.   Current Outpatient Prescriptions  Medication Sig Dispense Refill  . Calcium Carb-Cholecalciferol (CALCIUM + D3 PO) Take 2 tablets by mouth daily.    . cholecalciferol (VITAMIN D) 1000 UNITS tablet Take 1,000 Units by mouth daily.    Marland Kitchen exemestane (AROMASIN) 25 MG tablet Take 1 tablet (25 mg total) by mouth daily after breakfast. 30 tablet 4  . metoprolol (LOPRESSOR) 50 MG tablet Take 1 tablet by mouth 2 (two) times daily.    . NONFORMULARY OR COMPOUNDED ITEM Combination compound cream-[place] Baclofen 2%, Doxepin 5%, Gabapentin 6%, Topiramate 2%, Pentoxifylline 3% 120 grams with 3 refills Apply 1-2 grams to affected area 3-4 times daily    . Probiotic Product (PROBIOTIC DAILY PO) Take by mouth daily.    . simvastatin (ZOCOR) 40 MG tablet Take 1 tablet by mouth daily.    . valsartan-hydrochlorothiazide (DIOVAN-HCT) 320-25 MG per tablet Take 1 tablet by mouth daily.     No current facility-administered medications for this visit.     Allergies  Allergen Reactions  . Prilosec Otc [Omeprazole Magnesium] Itching    Past Medical History:  Diagnosis Date  . Adenomatous colon polyp   . GERD (gastroesophageal reflux disease)   . HTN (hypertension)   . Hyperlipidemia   . Radiation 08/03/13-08/24/13   right breast 42.72 gray  . Wears glasses     Past Surgical History:  Procedure Laterality Date  . APPENDECTOMY  1956  . BREAST LUMPECTOMY WITH NEEDLE LOCALIZATION AND AXILLARY SENTINEL LYMPH NODE BX Right 05/29/2013   Procedure: BREAST  LUMPECTOMY WITH NEEDLE LOCALIZATION AND AXILLARY SENTINEL LYMPH NODE BX;  Surgeon: Edward Jolly, MD;  Location: [facility];  Service: General;  Laterality: Right;  . CARDIAC CATHETERIZATION  2008  . [place]  . KNEE ARTHROSCOPY  2001   right  . RE-EXCISION OF BREAST LUMPECTOMY Right 06/12/2013   Procedure: RE-EXCISION OF BREAST LUMPECTOMY;  Surgeon: Edward Jolly, MD;  Location: [facility] [facility];  Service: General;  Laterality: Right;  . TUBAL LIGATION  1976  . UPPER GI ENDOSCOPY      Social History   Social History  . Marital status: Widowed    Spouse name: N/A  . Number of children: 2  . Years of education: N/A   Occupational History  . retired    Social History Main Topics  . Smoking status: Never Smoker  . Smokeless tobacco: Never Used  . Alcohol use No  . Drug use: No  . Sexual activity: Not on file   Other Topics Concern  . Not on file   Social History Narrative  . No narrative on file    Family History  Problem Relation Age of Onset  . Coronary artery disease Father     MI at age 82  . Heart disease Father   . Heart disease Mother   . Hypertension Mother   . Cancer Cousin 47    had lumpectomy and radiation,  25 years ago  . Colon cancer Neg Hx   . Esophageal cancer Neg  Hx   . Rectal cancer Neg Hx   . Stomach cancer Neg Hx     ROS: no fevers or chills, productive cough, hemoptysis, dysphasia, odynophagia, melena, hematochezia, dysuria, hematuria, rash, seizure activity, orthopnea, PND, pedal edema, claudication. Remaining systems are negative.  Physical Exam:   There were no vitals taken for this visit.  General:  Well developed/well nourished in NAD Skin warm/dry Patient not depressed No peripheral clubbing Back-normal HEENT-normal/normal eyelids Neck supple/normal carotid upstroke bilaterally; no bruits; no JVD; no thyromegaly chest - CTA/ normal expansion CV - RRR/normal S1 and S2; no  murmurs, rubs or gallops;  PMI nondisplaced Abdomen -NT/ND, no HSM, no mass, + bowel sounds, no bruit 2+ femoral pulses, no bruits Ext-no edema, chords, 2+ DP Neuro-grossly nonfocal  ECG

## 2016-01-05 ENCOUNTER — Ambulatory Visit: Payer: Medicare Other | Admitting: Cardiology

## 2016-01-06 DIAGNOSIS — I10 Essential (primary) hypertension: Secondary | ICD-10-CM | POA: Diagnosis not present

## 2016-01-06 DIAGNOSIS — M722 Plantar fascial fibromatosis: Secondary | ICD-10-CM | POA: Diagnosis not present

## 2016-01-06 DIAGNOSIS — Z6837 Body mass index (BMI) 37.0-37.9, adult: Secondary | ICD-10-CM | POA: Diagnosis not present

## 2016-01-06 DIAGNOSIS — M7661 Achilles tendinitis, right leg: Secondary | ICD-10-CM | POA: Diagnosis not present

## 2016-01-11 DIAGNOSIS — M7661 Achilles tendinitis, right leg: Secondary | ICD-10-CM | POA: Diagnosis not present

## 2016-01-11 DIAGNOSIS — M722 Plantar fascial fibromatosis: Secondary | ICD-10-CM | POA: Diagnosis not present

## 2016-01-12 DIAGNOSIS — M7661 Achilles tendinitis, right leg: Secondary | ICD-10-CM | POA: Diagnosis not present

## 2016-01-12 DIAGNOSIS — M722 Plantar fascial fibromatosis: Secondary | ICD-10-CM | POA: Diagnosis not present

## 2016-01-12 NOTE — Progress Notes (Signed)
HPI: 76 year old female for evaluation of atrial fibrillation. Patient seen in this office previously but not since October 2017. Echocardiogram December 2008 showed normal LV function, mild left atrial enlargement and mild mitral regurgitation. Cardiac catheterization December 2008 showed 30% PDA but no other obstructive disease and normal LV function. Monitor 11/17 showed PAF. Cardiology is now asked to evaluate. Patient has had intermittent palpitations for quite some time. They have increased in frequency recently. They're described as heart skipping associated with chest and head tightness. Episodes typically last 1-1-1/2 hours. She otherwise has some dyspnea on exertion but no orthopnea, PND, exertional chest pain or syncope. No bleeding. Occasional minimal pedal edema.  Current Outpatient Prescriptions  Medication Sig Dispense Refill  . amLODipine (NORVASC) 5 MG tablet Take 1 tablet (5 mg total) by mouth daily. 60 tablet 12  . Calcium Carb-Cholecalciferol (CALCIUM + D3 PO) Take 2 tablets by mouth daily.    . cholecalciferol (VITAMIN D) 1000 UNITS tablet Take 1,000 Units by mouth daily.    Marland Kitchen exemestane (AROMASIN) 25 MG tablet Take 1 tablet (25 mg total) by mouth daily after breakfast. 30 tablet 4  . metoprolol (LOPRESSOR) 50 MG tablet Take 1 tablet by mouth 2 (two) times daily.    . NONFORMULARY OR COMPOUNDED ITEM Combination compound cream-[place] Baclofen 2%, Doxepin 5%, Gabapentin 6%, Topiramate 2%, Pentoxifylline 3% 120 grams with 3 refills Apply 1-2 grams to affected area 3-4 times daily    . simvastatin (ZOCOR) 20 MG tablet Take 20 mg by mouth daily.    Marland Kitchen spironolactone (ALDACTONE) 25 MG tablet Take 25 mg by mouth daily.    . valsartan-hydrochlorothiazide (DIOVAN-HCT) 320-25 MG per tablet Take 1 tablet by mouth daily.    Marland Kitchen apixaban (ELIQUIS) 5 MG TABS tablet Take 1 tablet (5 mg total) by mouth 2 (two) times daily. 60 tablet 6   No current facility-administered medications for  this visit.     Allergies  Allergen Reactions  . Prilosec Otc [Omeprazole Magnesium] Itching     Past Medical History:  Diagnosis Date  . Adenomatous colon polyp   . Breast cancer ([place])   . GERD (gastroesophageal reflux disease)   . HTN (hypertension)   . Hyperlipidemia   . Radiation 08/03/13-08/24/13   right breast 42.72 gray  . Wears glasses     Past Surgical History:  Procedure Laterality Date  . APPENDECTOMY  1956  . BREAST LUMPECTOMY WITH NEEDLE LOCALIZATION AND AXILLARY SENTINEL LYMPH NODE BX Right 05/29/2013   Procedure: BREAST LUMPECTOMY WITH NEEDLE LOCALIZATION AND AXILLARY SENTINEL LYMPH NODE BX;  Surgeon: Edward Jolly, MD;  Location: [facility];  Service: General;  Laterality: Right;  . CARDIAC CATHETERIZATION  2008  . [place]  . KNEE ARTHROSCOPY  2001   right  . RE-EXCISION OF BREAST LUMPECTOMY Right 06/12/2013   Procedure: RE-EXCISION OF BREAST LUMPECTOMY;  Surgeon: Edward Jolly, MD;  Location: [facility];  Service: General;  Laterality: Right;  . TUBAL LIGATION  1976  . UPPER GI ENDOSCOPY      Social History   Social History  . Marital status: Widowed    Spouse name: N/A  . Number of children: 2  . Years of education: N/A   Occupational History  . retired    Social History Main Topics  . Smoking status: Never Smoker  . Smokeless tobacco: Never Used  . Alcohol use No  . Drug use: No  . Sexual activity: Not  on file   Other Topics Concern  . Not on file   Social History Narrative  . No narrative on file    Family History  Problem Relation Age of Onset  . Coronary artery disease Father     MI at age 43  . Heart disease Father   . Heart disease Mother   . Hypertension Mother   . Cancer Cousin 47    had lumpectomy and radiation,  25 years ago  . Colon cancer Neg Hx   . Esophageal cancer Neg Hx   . Rectal cancer Neg Hx   . Stomach cancer Neg Hx     ROS: Arthralgias but no  fevers or chills, productive cough, hemoptysis, dysphasia, odynophagia, melena, hematochezia, dysuria, hematuria, rash, seizure activity, orthopnea, claudication. Remaining systems are negative.  Physical Exam:   Blood pressure 102/64, pulse (!) 58, height 5\' 3"  (1.6 m), weight 205 lb (93 kg).  General:  Well developed/obese in NAD Skin warm/dry Patient not depressed No peripheral clubbing Back-normal HEENT-normal/normal eyelids Neck supple/normal carotid upstroke bilaterally; no bruits; no JVD; no thyromegaly chest - CTA/ normal expansion CV - RRR/normal S1 and S2; no murmurs, rubs or gallops;  PMI nondisplaced Abdomen -NT/ND, no HSM, no mass, + bowel sounds, no bruit 2+ femoral pulses, no bruits Ext-trace edema, no chords, 2+ DP Neuro-grossly nonfocal  ECG - Sinus bradycardia at a rate of 58. No ST changes.  A/P  1 paroxysmal atrial fibrillation-patient did have an episode of palpitations while wearing her recent monitor that showed atrial fibrillation. We will continue with metoprolol at present dose. I would be hesitant to advance as she has bradycardia at baseline when in sinus rhythm. If she has more frequent episodes in the future we will consider an antiarrhythmic such chest tikosyn. She has embolic risk factors of female sex, age greater than 64 and hypertension. CHADSvasc 4. Discontinue aspirin and begin apixaban 5 mg BID. In 4 weeks check hemoglobin, renal function and TSH. Schedule echocardiogram to assess LV function.  2 hypertension-her blood pressure had been elevated but recent medications have been added and now she is running low by her report. I will decrease amlodipine to 5 mg daily and we will adjust further as needed.  3 hyperlipidemia-continue statin. Lipids and liver monitored by primary care.   Kirk Ruths, MD

## 2016-01-23 ENCOUNTER — Ambulatory Visit (INDEPENDENT_AMBULATORY_CARE_PROVIDER_SITE_OTHER): Payer: Medicare Other | Admitting: Cardiology

## 2016-01-23 ENCOUNTER — Encounter: Payer: Self-pay | Admitting: Cardiology

## 2016-01-23 VITALS — BP 102/64 | HR 58 | Ht 63.0 in | Wt 205.0 lb

## 2016-01-23 DIAGNOSIS — I10 Essential (primary) hypertension: Secondary | ICD-10-CM | POA: Diagnosis not present

## 2016-01-23 DIAGNOSIS — M722 Plantar fascial fibromatosis: Secondary | ICD-10-CM | POA: Diagnosis not present

## 2016-01-23 DIAGNOSIS — E7800 Pure hypercholesterolemia, unspecified: Secondary | ICD-10-CM | POA: Diagnosis not present

## 2016-01-23 DIAGNOSIS — I4891 Unspecified atrial fibrillation: Secondary | ICD-10-CM

## 2016-01-23 DIAGNOSIS — M7661 Achilles tendinitis, right leg: Secondary | ICD-10-CM | POA: Diagnosis not present

## 2016-01-23 MED ORDER — AMLODIPINE BESYLATE 5 MG PO TABS: 5.0000 mg | ORAL_TABLET | Freq: Every day | ORAL | 12 refills | Status: DC

## 2016-01-23 MED ORDER — APIXABAN 5 MG PO TABS: 5.0000 mg | ORAL_TABLET | Freq: Two times a day (BID) | ORAL | 6 refills | Status: DC

## 2016-01-23 NOTE — Patient Instructions (Signed)
Medication Instructions:   STOP ASPIRIN  START ELIQUIS 5 MG ONE TABLET TWICE DAILY  DECREASE AMLODIPINE TO 5 MG ONCE DAILY= 1/2 OF THE 10 MG TABLET ONCE DAILY  Labwork:  Your physician recommends that you return for lab work in: 4 WEEKS  Testing/Procedures:  Your physician has requested that you have an echocardiogram. Echocardiography is a painless test that uses sound waves to create images of your heart. It provides your doctor with information about the size and shape of your heart and how well your heart's chambers and valves are working. This procedure takes approximately one hour. There are no restrictions for this procedure.    Follow-Up:  Your physician recommends that you schedule a follow-up appointment in: [place]

## 2016-01-27 DIAGNOSIS — M7661 Achilles tendinitis, right leg: Secondary | ICD-10-CM | POA: Diagnosis not present

## 2016-01-27 DIAGNOSIS — M722 Plantar fascial fibromatosis: Secondary | ICD-10-CM | POA: Diagnosis not present

## 2016-02-01 DIAGNOSIS — M7661 Achilles tendinitis, right leg: Secondary | ICD-10-CM | POA: Diagnosis not present

## 2016-02-01 DIAGNOSIS — M722 Plantar fascial fibromatosis: Secondary | ICD-10-CM | POA: Diagnosis not present

## 2016-02-03 DIAGNOSIS — M722 Plantar fascial fibromatosis: Secondary | ICD-10-CM | POA: Diagnosis not present

## 2016-02-03 DIAGNOSIS — M7661 Achilles tendinitis, right leg: Secondary | ICD-10-CM | POA: Diagnosis not present

## 2016-02-06 ENCOUNTER — Other Ambulatory Visit: Payer: Self-pay

## 2016-02-06 ENCOUNTER — Ambulatory Visit (HOSPITAL_COMMUNITY): Payer: Medicare Other | Attending: Cardiology

## 2016-02-06 DIAGNOSIS — I10 Essential (primary) hypertension: Secondary | ICD-10-CM | POA: Insufficient documentation

## 2016-02-06 DIAGNOSIS — Z853 Personal history of malignant neoplasm of breast: Secondary | ICD-10-CM | POA: Diagnosis not present

## 2016-02-06 DIAGNOSIS — I4891 Unspecified atrial fibrillation: Secondary | ICD-10-CM | POA: Diagnosis not present

## 2016-02-06 DIAGNOSIS — Z8249 Family history of ischemic heart disease and other diseases of the circulatory system: Secondary | ICD-10-CM | POA: Diagnosis not present

## 2016-02-06 DIAGNOSIS — Z923 Personal history of irradiation: Secondary | ICD-10-CM | POA: Insufficient documentation

## 2016-02-06 DIAGNOSIS — E785 Hyperlipidemia, unspecified: Secondary | ICD-10-CM | POA: Diagnosis not present

## 2016-02-06 DIAGNOSIS — R079 Chest pain, unspecified: Secondary | ICD-10-CM | POA: Diagnosis not present

## 2016-02-08 DIAGNOSIS — M7661 Achilles tendinitis, right leg: Secondary | ICD-10-CM | POA: Diagnosis not present

## 2016-02-08 DIAGNOSIS — M722 Plantar fascial fibromatosis: Secondary | ICD-10-CM | POA: Diagnosis not present

## 2016-02-16 DIAGNOSIS — M7661 Achilles tendinitis, right leg: Secondary | ICD-10-CM | POA: Diagnosis not present

## 2016-02-16 DIAGNOSIS — M722 Plantar fascial fibromatosis: Secondary | ICD-10-CM | POA: Diagnosis not present

## 2016-02-20 DIAGNOSIS — M722 Plantar fascial fibromatosis: Secondary | ICD-10-CM | POA: Diagnosis not present

## 2016-02-20 DIAGNOSIS — M7661 Achilles tendinitis, right leg: Secondary | ICD-10-CM | POA: Diagnosis not present

## 2016-02-23 DIAGNOSIS — I4891 Unspecified atrial fibrillation: Secondary | ICD-10-CM | POA: Diagnosis not present

## 2016-02-24 ENCOUNTER — Telehealth: Payer: Self-pay | Admitting: Pharmacist

## 2016-02-24 DIAGNOSIS — M7661 Achilles tendinitis, right leg: Secondary | ICD-10-CM | POA: Diagnosis not present

## 2016-02-24 DIAGNOSIS — M722 Plantar fascial fibromatosis: Secondary | ICD-10-CM | POA: Diagnosis not present

## 2016-02-24 LAB — BASIC METABOLIC PANEL
BUN: 18 mg/dL (ref 7–25)
CALCIUM: 9.6 mg/dL (ref 8.6–10.4)
CO2: 27 mmol/L (ref 20–31)
CREATININE: 1.06 mg/dL — AB (ref 0.60–0.93)
Chloride: 98 mmol/L (ref 98–110)
GLUCOSE: 120 mg/dL — AB (ref 65–99)
Potassium: 4.7 mmol/L (ref 3.5–5.3)
SODIUM: 136 mmol/L (ref 135–146)

## 2016-02-24 LAB — CBC
HCT: 41.5 % (ref 35.0–45.0)
HEMOGLOBIN: 13.5 g/dL (ref 11.7–15.5)
MCH: 27.6 pg (ref 27.0–33.0)
MCHC: 32.5 g/dL (ref 32.0–36.0)
MCV: 84.7 fL (ref 80.0–100.0)
MPV: 10.2 fL (ref 7.5–12.5)
Platelets: 279 10*3/uL (ref 140–400)
RBC: 4.9 MIL/uL (ref 3.80–5.10)
RDW: 14.4 % (ref 11.0–15.0)
WBC: 9.7 10*3/uL (ref 3.8–10.8)

## 2016-02-24 LAB — TSH: TSH: 0.48 m[IU]/L

## 2016-02-24 NOTE — Telephone Encounter (Signed)
Oral Chemotherapy Pharmacist Encounter  I received a letter from the [facility] [place] (PAF) in regards to copayment funds we had helped the patient obtain in 2017.  Patient has had no activity on her copayment funds account in the past 60 days, and if PAF does not have any activity on account prior to 05/03/16, they will rescind the funds awarded to Ms. Baldo.  I called and LVM for patient with this information and to call Oral oncology Clinic with any questions or concerns. Billing information for PAF in 09/29/15 telephone note by Randolm Idol, RN.  Johny Drilling, PharmD, BCPS, BCOP 02/24/2016  4:44 PM Oral Oncology Clinic [phone]

## 2016-03-05 ENCOUNTER — Other Ambulatory Visit: Payer: Self-pay | Admitting: Internal Medicine

## 2016-03-05 DIAGNOSIS — Z853 Personal history of malignant neoplasm of breast: Secondary | ICD-10-CM

## 2016-03-14 DIAGNOSIS — M7661 Achilles tendinitis, right leg: Secondary | ICD-10-CM | POA: Diagnosis not present

## 2016-03-14 DIAGNOSIS — M7662 Achilles tendinitis, left leg: Secondary | ICD-10-CM | POA: Diagnosis not present

## 2016-03-22 ENCOUNTER — Other Ambulatory Visit: Payer: Self-pay | Admitting: *Deleted

## 2016-03-22 MED ORDER — EXEMESTANE 25 MG PO TABS: 25.0000 mg | ORAL_TABLET | Freq: Every day | ORAL | 5 refills | Status: DC

## 2016-04-04 DIAGNOSIS — R8299 Other abnormal findings in urine: Secondary | ICD-10-CM | POA: Diagnosis not present

## 2016-04-04 DIAGNOSIS — N390 Urinary tract infection, site not specified: Secondary | ICD-10-CM | POA: Diagnosis not present

## 2016-04-04 DIAGNOSIS — R300 Dysuria: Secondary | ICD-10-CM | POA: Diagnosis not present

## 2016-04-12 DIAGNOSIS — Z6835 Body mass index (BMI) 35.0-35.9, adult: Secondary | ICD-10-CM | POA: Diagnosis not present

## 2016-04-12 DIAGNOSIS — K5900 Constipation, unspecified: Secondary | ICD-10-CM | POA: Diagnosis not present

## 2016-04-12 DIAGNOSIS — R300 Dysuria: Secondary | ICD-10-CM | POA: Diagnosis not present

## 2016-04-12 DIAGNOSIS — N390 Urinary tract infection, site not specified: Secondary | ICD-10-CM | POA: Diagnosis not present

## 2016-04-12 DIAGNOSIS — R8290 Unspecified abnormal findings in urine: Secondary | ICD-10-CM | POA: Diagnosis not present

## 2016-04-12 DIAGNOSIS — I10 Essential (primary) hypertension: Secondary | ICD-10-CM | POA: Diagnosis not present

## 2016-04-16 ENCOUNTER — Ambulatory Visit
Admission: RE | Admit: 2016-04-16 | Discharge: 2016-04-16 | Disposition: A | Discharge status: Home / Self Care | Payer: Medicare Other | Source: Ambulatory Visit | Attending: Internal Medicine | Admitting: Internal Medicine

## 2016-04-16 DIAGNOSIS — R928 Other abnormal and inconclusive findings on diagnostic imaging of breast: Secondary | ICD-10-CM | POA: Diagnosis not present

## 2016-04-16 DIAGNOSIS — Z853 Personal history of malignant neoplasm of breast: Secondary | ICD-10-CM

## 2016-04-18 DIAGNOSIS — D2311 Other benign neoplasm of skin of right eyelid, including canthus: Secondary | ICD-10-CM | POA: Diagnosis not present

## 2016-04-18 DIAGNOSIS — H2513 Age-related nuclear cataract, bilateral: Secondary | ICD-10-CM | POA: Diagnosis not present

## 2016-04-26 NOTE — Progress Notes (Signed)
HPI: FU atrial fibrillation. Cardiac catheterization December 2008 showed 30% PDA but no other obstructive disease and normal LV function. Monitor 11/17 showed PAF. Echocardiogram February 2018 showed normal LV systolic function and grade 1 diastolic dysfunction. Mild LAE. TSH February 2018 0.48. Since last seen, the patient has dyspnea with more extreme activities but not with routine activities. It is relieved with rest. It is not associated with chest pain. There is no orthopnea, PND or pedal edema. There is no syncope or palpitations. There is no exertional chest pain.   Current Outpatient Prescriptions  Medication Sig Dispense Refill  . amLODipine (NORVASC) 5 MG tablet Take 1 tablet (5 mg total) by mouth daily. 60 tablet 12  . apixaban (ELIQUIS) 5 MG TABS tablet Take 1 tablet (5 mg total) by mouth 2 (two) times daily. 60 tablet 6  . Calcium Carb-Cholecalciferol (CALCIUM + D3 PO) Take 2 tablets by mouth daily.    . cholecalciferol (VITAMIN D) 1000 UNITS tablet Take 1,000 Units by mouth daily.    Marland Kitchen exemestane (AROMASIN) 25 MG tablet Take 1 tablet (25 mg total) by mouth daily after breakfast. 30 tablet 5  . metoprolol (LOPRESSOR) 50 MG tablet Take 1 tablet by mouth 2 (two) times daily.    Marland Kitchen MYRBETRIQ 25 MG TB24 tablet Take 1 tablet by mouth daily.    . NONFORMULARY OR COMPOUNDED ITEM Combination compound cream-[facility]tech Baclofen 2%, Doxepin 5%, Gabapentin 6%, Topiramate 2%, Pentoxifylline 3% 120 grams with 3 refills Apply 1-2 grams to affected area 3-4 times daily    . simvastatin (ZOCOR) 20 MG tablet Take 20 mg by mouth daily.    Marland Kitchen spironolactone (ALDACTONE) 25 MG tablet Take 25 mg by mouth daily.    . valsartan-hydrochlorothiazide (DIOVAN-HCT) 320-25 MG per tablet Take 1 tablet by mouth daily.     No current facility-administered medications for this visit.      Past Medical History:  Diagnosis Date  . Adenomatous colon polyp   . Breast cancer ([place])   . GERD (gastroesophageal  reflux disease)   . HTN (hypertension)   . Hyperlipidemia   . Radiation 08/03/13-08/24/13   right breast 42.72 gray  . Wears glasses     Past Surgical History:  Procedure Laterality Date  . APPENDECTOMY  1956  . BREAST BIOPSY Right 04/22/2013  . BREAST LUMPECTOMY Right 05/29/2013  . BREAST LUMPECTOMY WITH NEEDLE LOCALIZATION AND AXILLARY SENTINEL LYMPH NODE BX Right 05/29/2013   Procedure: BREAST LUMPECTOMY WITH NEEDLE LOCALIZATION AND AXILLARY SENTINEL LYMPH NODE BX;  Surgeon: Edward Jolly, MD;  Location: [place];  Service: General;  Laterality: Right;  . CARDIAC CATHETERIZATION  2008  . [place]  . KNEE ARTHROSCOPY  2001   right  . RE-EXCISION OF BREAST LUMPECTOMY Right 06/12/2013   Procedure: RE-EXCISION OF BREAST LUMPECTOMY;  Surgeon: Edward Jolly, MD;  Location: [place] [facility];  Service: General;  Laterality: Right;  . TUBAL LIGATION  1976  . UPPER GI ENDOSCOPY      Social History   Social History  . Marital status: Widowed    Spouse name: N/A  . Number of children: 2  . Years of education: N/A   Occupational History  . retired    Social History Main Topics  . Smoking status: Never Smoker  . Smokeless tobacco: Never Used  . Alcohol use No  . Drug use: No  . Sexual activity: Not on file   Other Topics Concern  .  Not on file   Social History Narrative  . No narrative on file    Family History  Problem Relation Age of Onset  . Coronary artery disease Father     MI at age 71  . Heart disease Father   . Heart disease Mother   . Hypertension Mother   . Cancer Cousin 47    had lumpectomy and radiation,  25 years ago  . Colon cancer Neg Hx   . Esophageal cancer Neg Hx   . Rectal cancer Neg Hx   . Stomach cancer Neg Hx     ROS: no fevers or chills, productive cough, hemoptysis, dysphasia, odynophagia, melena, hematochezia, dysuria, hematuria, rash, seizure activity, orthopnea, PND, pedal edema,  claudication. Remaining systems are negative.  Physical Exam: Well-developed well-nourished in no acute distress.  Skin is warm and dry.  HEENT is normal.  Neck is supple. No bruits Chest is clear to auscultation with normal expansion. No wheeze Cardiovascular exam is regular rate and rhythm. No murmur Abdominal exam nontender or distended. No masses palpated. Extremities show no edema. neuro grossly intact   A/P  1 Paroxysmal atrial fibrillation-patient remains in sinus rhythm on examination. Continue metoprolol for rate control if atrial fibrillation recurs. We will consider an antiarrhythmic in the future if she has more frequent episodes. She has had brief palpitations but no sustained episodes of atrial fibrillation.  Continue apixaban.   2 hypertension-blood pressure controlled. Continue present medications.  3 hyperlipidemia-continue statin.  Kirk Ruths, MD

## 2016-05-03 ENCOUNTER — Ambulatory Visit (INDEPENDENT_AMBULATORY_CARE_PROVIDER_SITE_OTHER): Payer: Medicare Other | Admitting: Cardiology

## 2016-05-03 ENCOUNTER — Encounter: Payer: Self-pay | Admitting: Cardiology

## 2016-05-03 VITALS — BP 120/62 | HR 76 | Ht 63.0 in | Wt 197.0 lb

## 2016-05-03 DIAGNOSIS — E7800 Pure hypercholesterolemia, unspecified: Secondary | ICD-10-CM | POA: Diagnosis not present

## 2016-05-03 DIAGNOSIS — I4891 Unspecified atrial fibrillation: Secondary | ICD-10-CM

## 2016-05-03 DIAGNOSIS — I10 Essential (primary) hypertension: Secondary | ICD-10-CM

## 2016-05-03 NOTE — Patient Instructions (Signed)
Your physician wants you to follow-up in: 6 MONTHS WITH DR CRENSHAW You will receive a reminder letter in the mail two months in advance. If you don't receive a letter, please call our office to schedule the follow-up appointment.   If you need a refill on your cardiac medications before your next appointment, please call your pharmacy.  

## 2016-06-21 ENCOUNTER — Telehealth: Payer: Self-pay | Admitting: Physician Assistant

## 2016-06-21 DIAGNOSIS — R079 Chest pain, unspecified: Secondary | ICD-10-CM | POA: Diagnosis not present

## 2016-06-21 DIAGNOSIS — I480 Paroxysmal atrial fibrillation: Secondary | ICD-10-CM | POA: Diagnosis not present

## 2016-06-21 DIAGNOSIS — Z6836 Body mass index (BMI) 36.0-36.9, adult: Secondary | ICD-10-CM | POA: Diagnosis not present

## 2016-06-21 DIAGNOSIS — R0609 Other forms of dyspnea: Secondary | ICD-10-CM | POA: Diagnosis not present

## 2016-06-21 NOTE — Telephone Encounter (Signed)
Received records from [facility] for appointment on 06/28/16 with Almyra Deforest, PA.  Records put with Hao's schedule for 06/28/16. lp

## 2016-06-28 ENCOUNTER — Encounter: Payer: Self-pay | Admitting: Physician Assistant

## 2016-06-28 ENCOUNTER — Ambulatory Visit (INDEPENDENT_AMBULATORY_CARE_PROVIDER_SITE_OTHER): Payer: Medicare Other | Admitting: Physician Assistant

## 2016-06-28 VITALS — BP 110/58 | HR 56 | Ht 63.0 in | Wt 199.0 lb

## 2016-06-28 DIAGNOSIS — R0789 Other chest pain: Secondary | ICD-10-CM

## 2016-06-28 DIAGNOSIS — I480 Paroxysmal atrial fibrillation: Secondary | ICD-10-CM

## 2016-06-28 DIAGNOSIS — R0600 Dyspnea, unspecified: Secondary | ICD-10-CM

## 2016-06-28 DIAGNOSIS — I10 Essential (primary) hypertension: Secondary | ICD-10-CM | POA: Diagnosis not present

## 2016-06-28 DIAGNOSIS — R0609 Other forms of dyspnea: Secondary | ICD-10-CM | POA: Diagnosis not present

## 2016-06-28 DIAGNOSIS — E785 Hyperlipidemia, unspecified: Secondary | ICD-10-CM

## 2016-06-28 NOTE — Progress Notes (Signed)
Cardiology Office Note    Date:  06/28/2016   ID:  Gabriella Walker, DOB [birth year], MRN [id number]  PCP:  [name], MD  Cardiologist:  Dr. Stanford Breed   Chief Complaint  Patient presents with  . Follow-up    chest pressure DOE    History of Present Illness:  Gabriella Walker is a 76 y.o. female with history of hypertension, hyperlipidemia, atrial fibrillation and mild coronary artery disease. Cardiac catheterization in December 2008 showed 30% PDA but no other obstructive disease. Heart monitor in December 2017 showed atrial fibrillation. Echocardiogram in February 2018 showed normal LV systolic function, grade 1 diastolic dysfunction, mild left atrial dilatation. TSH in February 2018 was 0.48. Since then, she has been having some dyspnea with more extreme activities but not with routine activity. It is relieved with rest. It was nonexertional chest pain.  She presents today for cardiology office evaluation. For the past several weeks, she has been noticing increasing dyspnea on exertion. She says she can barely walk from the clinic room to the front desk without short of breath. This has been noticeably changed recently. She also occasionally noticed tightness as well however it is not very consistent. I did recommend outpatient Lexiscan Myoview. As far as her palpitation, current metoprolol dose is controlling her palpitation quite well. In the last month, she only had 2 recurrences of palpitation after waking up in the morning. Her baseline heart rate is in the 50s, I would not recommend permanently increasing her rate control medication. I did recommend her to take an additional 25 mg metoprolol on a as needed basis for recurrent palpitation.   Past Medical History:  Diagnosis Date  . Adenomatous colon polyp   . Breast cancer ([place])   . GERD (gastroesophageal reflux disease)   . HTN (hypertension)   . Hyperlipidemia   . Radiation 08/03/13-08/24/13   right breast 42.72 gray  . Wears  glasses     Past Surgical History:  Procedure Laterality Date  . APPENDECTOMY  1956  . BREAST BIOPSY Right 04/22/2013  . BREAST LUMPECTOMY Right 05/29/2013  . BREAST LUMPECTOMY WITH NEEDLE LOCALIZATION AND AXILLARY SENTINEL LYMPH NODE BX Right 05/29/2013   Procedure: BREAST LUMPECTOMY WITH NEEDLE LOCALIZATION AND AXILLARY SENTINEL LYMPH NODE BX;  Surgeon: Edward Jolly, MD;  Location: [place];  Service: General;  Laterality: Right;  . CARDIAC CATHETERIZATION  2008  . [place]  . KNEE ARTHROSCOPY  2001   right  . RE-EXCISION OF BREAST LUMPECTOMY Right 06/12/2013   Procedure: RE-EXCISION OF BREAST LUMPECTOMY;  Surgeon: Edward Jolly, MD;  Location: [place];  Service: General;  Laterality: Right;  . TUBAL LIGATION  1976  . UPPER GI ENDOSCOPY      Current Medications: Outpatient Medications Prior to Visit  Medication Sig Dispense Refill  . amLODipine (NORVASC) 5 MG tablet Take 1 tablet (5 mg total) by mouth daily. 60 tablet 12  . apixaban (ELIQUIS) 5 MG TABS tablet Take 1 tablet (5 mg total) by mouth 2 (two) times daily. 60 tablet 6  . Calcium Carb-Cholecalciferol (CALCIUM + D3 PO) Take 2 tablets by mouth daily.    . cholecalciferol (VITAMIN D) 1000 UNITS tablet Take 1,000 Units by mouth daily.    Marland Kitchen exemestane (AROMASIN) 25 MG tablet Take 1 tablet (25 mg total) by mouth daily after breakfast. 30 tablet 5  . metoprolol (LOPRESSOR) 50 MG tablet Take 1 tablet by mouth 2 (two) times daily.    Marland Kitchen  MYRBETRIQ 25 MG TB24 tablet Take 1 tablet by mouth daily.    . NONFORMULARY OR COMPOUNDED ITEM Combination compound cream-[place]tech Baclofen 2%, Doxepin 5%, Gabapentin 6%, Topiramate 2%, Pentoxifylline 3% 120 grams with 3 refills Apply 1-2 grams to affected area 3-4 times daily    . simvastatin (ZOCOR) 20 MG tablet Take 20 mg by mouth daily.    Marland Kitchen spironolactone (ALDACTONE) 25 MG tablet Take 25 mg by mouth daily.    .  valsartan-hydrochlorothiazide (DIOVAN-HCT) 320-25 MG per tablet Take 1 tablet by mouth daily.     No facility-administered medications prior to visit.      Allergies:   Prilosec otc [omeprazole magnesium]   Social History   Social History  . Marital status: Widowed    Spouse name: N/A  . Number of children: 2  . Years of education: N/A   Occupational History  . retired    Social History Main Topics  . Smoking status: Never Smoker  . Smokeless tobacco: Never Used  . Alcohol use No  . Drug use: No  . Sexual activity: Not Asked   Other Topics Concern  . None   Social History Narrative  . None     Family History:  The patient's family history includes Cancer (age of onset: 64) in her cousin; Coronary artery disease in her father; Heart disease in her father and mother; Hypertension in her mother.   ROS:   Please see the history of present illness.    ROS All other systems reviewed and are negative.   PHYSICAL EXAM:   VS:  BP (!) 110/58 (BP Location: Left Arm, Patient Position: Sitting, Cuff Size: Large)   Pulse (!) 56   Ht 5\' 3"  (1.6 m)   Wt 199 lb (90.3 kg)   BMI 35.25 kg/m    GEN: Well nourished, well developed, in no acute distress  HEENT: normal  Neck: no JVD, carotid bruits, or masses Cardiac: RRR; no murmurs, rubs, or gallops,no edema  Respiratory:  clear to auscultation bilaterally, normal work of breathing GI: soft, nontender, nondistended, + BS MS: no deformity or atrophy  Skin: warm and dry, no rash Neuro:  Alert and Oriented x 3, Strength and sensation are intact Psych: euthymic mood, full affect  Wt Readings from Last 3 Encounters:  06/28/16 199 lb (90.3 kg)  05/03/16 197 lb (89.4 kg)  01/23/16 205 lb (93 kg)      Studies/Labs Reviewed:   EKG:  EKG is ordered today.  The ekg ordered today demonstrates Sinus bradycardia, heart rate 56, no significant ST-T wave changes  Recent Labs: 02/23/2016: BUN 18; Creat 1.06; Hemoglobin 13.5; Platelets  279; Potassium 4.7; Sodium 136; TSH 0.48   Lipid Panel No results found for: CHOL, TRIG, HDL, CHOLHDL, VLDL, LDLCALC, LDLDIRECT  Additional studies/ records that were reviewed today include:   Echo 02/06/2016 LV EF: 65% -   70%  Study Conclusions  - Left ventricle: The cavity size was normal. Wall thickness was   normal. Systolic function was vigorous. The estimated ejection   fraction was in the range of 65% to 70%. Wall motion was normal;   there were no regional wall motion abnormalities. Doppler   parameters are consistent with abnormal left ventricular   relaxation (grade 1 diastolic dysfunction). - Aortic valve: Trileaflet; mildly thickened, mildly calcified   leaflets. - Left atrium: The atrium was mildly dilated   Event monitor 11/16/2015 Study Highlights   Sinus rhythm Atrial fibrillation with an elevated ventricular response is  noted though overall burden is low. No pauses No ventricular arrhythmias     ASSESSMENT:    1. DOE (dyspnea on exertion)   2. Chest tightness or pressure   3. PAF (paroxysmal atrial fibrillation) ([place])   4. Essential hypertension   5. Hyperlipidemia, unspecified hyperlipidemia type      PLAN:  In order of problems listed above:  1. Progressive dyspnea on exertion: Occasional chest tightness as well. Symptom has been worsening in the past few months. I recommended Lexiscan Myoview.   2. PAF: Currently on eliquis and 50 mg twice a day metoprolol. In the past month, she only had 2 episodes of palpitation. Her baseline heart rate is in the 50s, I am hesitant to increase her Toprol further permanently, I recommended 25 mg as needed metoprolol for recurrent palpitation.  3. Hypertension: She is on 4 different blood pressure medication at this time, blood pressure is very well controlled.  4. Hyperlipidemia: On Zocor    Medication Adjustments/Labs and Tests Ordered: Current medicines are reviewed at length with the patient today.   Concerns regarding medicines are outlined above.  Medication changes, Labs and Tests ordered today are listed in the Patient Instructions below. Patient Instructions  Medication Instructions:  OK TO TAKE 1/2 TABLET METOPROLOL (25MG ) FOR HEART RATE >100  If you need a refill on your cardiac medications before your next appointment, please call your pharmacy.  Testing/Procedures: Your physician has requested that you have a lexiscan myoview. For further information please visit [email or web address]. Please follow instruction sheet, as given.  Follow-Up: Your physician wants you to follow-up in: [place].   Special Instructions: NO CAFFEINE 24 HOURS PRIOR TO LEXISCAN-STRESS TESTING, NO FOOD OR DRINK PRIOR TO SCAN, HOLD MORNING MEDICATIONS PRIOR TO SCAN.    Thank you for choosing [facility] at [facility] [employer], [place]  06/28/2016 11:21 PM    [address], [place], [place]  [address] Phone: [phone]; Fax: [phone]

## 2016-06-28 NOTE — Patient Instructions (Signed)
Medication Instructions:  OK TO TAKE 1/2 TABLET METOPROLOL (25MG ) FOR HEART RATE >100  If you need a refill on your cardiac medications before your next appointment, please call your pharmacy.  Testing/Procedures: Your physician has requested that you have a lexiscan myoview. For further information please visit [email or web address]. Please follow instruction sheet, as given.  Follow-Up: Your physician wants you to follow-up in: [place].   Special Instructions: NO CAFFEINE 24 HOURS PRIOR TO LEXISCAN-STRESS TESTING, NO FOOD OR DRINK PRIOR TO SCAN, HOLD MORNING MEDICATIONS PRIOR TO SCAN.    Thank you for choosing [facility] at [facility]!!

## 2016-07-05 ENCOUNTER — Telehealth (HOSPITAL_COMMUNITY): Payer: Self-pay

## 2016-07-05 NOTE — Telephone Encounter (Signed)
Encounter complete. 

## 2016-07-10 ENCOUNTER — Ambulatory Visit (HOSPITAL_COMMUNITY)
Admission: RE | Admit: 2016-07-10 | Discharge: 2016-07-10 | Disposition: A | Discharge status: Home / Self Care | Payer: Medicare Other | Source: Ambulatory Visit | Attending: Cardiovascular Disease | Admitting: Cardiovascular Disease

## 2016-07-10 DIAGNOSIS — Z8249 Family history of ischemic heart disease and other diseases of the circulatory system: Secondary | ICD-10-CM | POA: Insufficient documentation

## 2016-07-10 DIAGNOSIS — I10 Essential (primary) hypertension: Secondary | ICD-10-CM | POA: Insufficient documentation

## 2016-07-10 DIAGNOSIS — R0609 Other forms of dyspnea: Secondary | ICD-10-CM

## 2016-07-10 DIAGNOSIS — R000 Tachycardia, unspecified: Secondary | ICD-10-CM | POA: Diagnosis not present

## 2016-07-10 DIAGNOSIS — I4891 Unspecified atrial fibrillation: Secondary | ICD-10-CM | POA: Insufficient documentation

## 2016-07-10 DIAGNOSIS — I2510 Atherosclerotic heart disease of native coronary artery without angina pectoris: Secondary | ICD-10-CM | POA: Diagnosis not present

## 2016-07-10 DIAGNOSIS — Z853 Personal history of malignant neoplasm of breast: Secondary | ICD-10-CM | POA: Diagnosis not present

## 2016-07-10 DIAGNOSIS — R0600 Dyspnea, unspecified: Secondary | ICD-10-CM

## 2016-07-10 DIAGNOSIS — E663 Overweight: Secondary | ICD-10-CM | POA: Insufficient documentation

## 2016-07-10 DIAGNOSIS — R0789 Other chest pain: Secondary | ICD-10-CM | POA: Insufficient documentation

## 2016-07-10 DIAGNOSIS — Z6835 Body mass index (BMI) 35.0-35.9, adult: Secondary | ICD-10-CM | POA: Insufficient documentation

## 2016-07-10 LAB — MYOCARDIAL PERFUSION IMAGING
LV dias vol: 85 mL (ref 46–106)
LV sys vol: 29 mL
Peak HR: 83 {beats}/min
Rest HR: 53 {beats}/min
SDS: 0
SRS: 2
SSS: 2
TID: 1.06

## 2016-07-10 MED ORDER — REGADENOSON 0.4 MG/5ML IV SOLN
0.4000 mg | Freq: Once | INTRAVENOUS | Status: AC
  Administered 2016-07-10: 0.4 mg via INTRAVENOUS

## 2016-07-10 MED ORDER — AMINOPHYLLINE 25 MG/ML IV SOLN
75.0000 mg | Freq: Once | INTRAVENOUS | Status: AC
  Administered 2016-07-10: 75 mg via INTRAVENOUS

## 2016-07-10 MED ORDER — TECHNETIUM TC 99M TETROFOSMIN IV KIT
29.4000 | PACK | Freq: Once | INTRAVENOUS | Status: AC | PRN
  Administered 2016-07-10: 29.4 via INTRAVENOUS
  Filled 2016-07-10: qty 30

## 2016-07-10 MED ORDER — TECHNETIUM TC 99M TETROFOSMIN IV KIT
10.2000 | PACK | Freq: Once | INTRAVENOUS | Status: AC | PRN
  Administered 2016-07-10: 10.2 via INTRAVENOUS
  Filled 2016-07-10: qty 11

## 2016-07-12 DIAGNOSIS — C50911 Malignant neoplasm of unspecified site of right female breast: Secondary | ICD-10-CM | POA: Diagnosis not present

## 2016-07-17 ENCOUNTER — Other Ambulatory Visit: Payer: Self-pay | Admitting: General Surgery

## 2016-07-17 DIAGNOSIS — N63 Unspecified lump in unspecified breast: Secondary | ICD-10-CM

## 2016-07-18 ENCOUNTER — Other Ambulatory Visit: Payer: Self-pay | Admitting: General Surgery

## 2016-07-18 DIAGNOSIS — N63 Unspecified lump in unspecified breast: Secondary | ICD-10-CM

## 2016-07-20 ENCOUNTER — Telehealth: Payer: Self-pay | Admitting: Oncology

## 2016-07-20 ENCOUNTER — Other Ambulatory Visit (HOSPITAL_BASED_OUTPATIENT_CLINIC_OR_DEPARTMENT_OTHER): Payer: Medicare Other

## 2016-07-20 ENCOUNTER — Ambulatory Visit (HOSPITAL_BASED_OUTPATIENT_CLINIC_OR_DEPARTMENT_OTHER): Payer: Medicare Other | Admitting: Oncology

## 2016-07-20 VITALS — BP 133/59 | HR 62 | Temp 98.6°F | Resp 18 | Ht 63.0 in | Wt 202.3 lb

## 2016-07-20 DIAGNOSIS — C50911 Malignant neoplasm of unspecified site of right female breast: Secondary | ICD-10-CM

## 2016-07-20 DIAGNOSIS — M818 Other osteoporosis without current pathological fracture: Secondary | ICD-10-CM

## 2016-07-20 LAB — COMPREHENSIVE METABOLIC PANEL
ALT: 14 U/L (ref 0–55)
ANION GAP: 10 meq/L (ref 3–11)
AST: 17 U/L (ref 5–34)
Albumin: 3.7 g/dL (ref 3.5–5.0)
Alkaline Phosphatase: 131 U/L (ref 40–150)
BILIRUBIN TOTAL: 0.56 mg/dL (ref 0.20–1.20)
BUN: 15 mg/dL (ref 7.0–26.0)
CO2: 26 meq/L (ref 22–29)
CREATININE: 1 mg/dL (ref 0.6–1.1)
Calcium: 10 mg/dL (ref 8.4–10.4)
Chloride: 103 mEq/L (ref 98–109)
EGFR: 55 mL/min/{1.73_m2} — ABNORMAL LOW (ref >90)
GLUCOSE: 108 mg/dL (ref 70–140)
Potassium: 4 mEq/L (ref 3.5–5.1)
SODIUM: 138 meq/L (ref 136–145)
TOTAL PROTEIN: 7 g/dL (ref 6.4–8.3)

## 2016-07-20 LAB — CBC WITH DIFFERENTIAL/PLATELET
BASO%: 0.8 % (ref 0.0–2.0)
Basophils Absolute: 0.1 10*3/uL (ref 0.0–0.1)
EOS%: 2.8 % (ref 0.0–7.0)
Eosinophils Absolute: 0.3 10*3/uL (ref 0.0–0.5)
HCT: 41.6 % (ref 34.8–46.6)
HGB: 14.2 g/dL (ref 11.6–15.9)
LYMPH%: 27.7 % (ref 14.0–49.7)
MCH: 28.5 pg (ref 25.1–34.0)
MCHC: 34.1 g/dL (ref 31.5–36.0)
MCV: 83.7 fL (ref 79.5–101.0)
MONO#: 0.7 10*3/uL (ref 0.1–0.9)
MONO%: 7.8 % (ref 0.0–14.0)
NEUT%: 60.9 % (ref 38.4–76.8)
NEUTROS ABS: 5.7 10*3/uL (ref 1.5–6.5)
PLATELETS: 207 10*3/uL (ref 145–400)
RBC: 4.97 10*6/uL (ref 3.70–5.45)
RDW: 13.4 % (ref 11.2–14.5)
WBC: 9.4 10*3/uL (ref 3.9–10.3)
lymph#: 2.6 10*3/uL (ref 0.9–3.3)

## 2016-07-20 NOTE — Telephone Encounter (Signed)
Gave patient avs report and appointments for January 2019.  °

## 2016-07-20 NOTE — Progress Notes (Signed)
Hematology and Oncology Follow Up Visit  Gabriella Walker [id number] Jul 24, 1940 76 y.o. 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 Her tumor is ER PR positive HER-2 negative. She has a low Ki-67. Her pathological staging is T1cN0. Diagnosed in April 2015.   Prior Therapy:  She is S/P right lumpectomy on 05/29/2013. 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 Invasive carcinoma was noted in the anterior margin. The tumor was ER positive, PR positive HER-2 negative. Her Ki-67 was 5%. Her sentinel lymph node biopsy was negative. Given her close margins, she underwent a reexcision on 06/12/2013 and the pathology was benign.  She is status post radiation therapy under the care of Gabriella Walker completed in August of 2015.  Current therapy: Arimidex 1 mg daily started in September 2015. This was switched to Aromasin in October 2016 because of arthralgias and myalgias.  Interim History: Gabriella Walker presents today for a followup visit. Since her last visit, she reports no recent changes in her health. She continues to follow with Gabriella Walker for routine breast examination. She had a normal mammogram in April 2018 but an abnormality was detected on physical exam and ultrasound is scheduled in the near future.  She continues on Aromasin and has tolerated it well. She denied any arthralgias or myalgias. She denied any joint pain or discomfort. He denied any pruritus. She continues to live independently and attends to activities of daily living. She denied any falls or syncope. She denied any muscle pain or tenderness.  She does not report any headaches or blurry vision or double vision. She has not reported any syncope. She does not report any seizures or alteration of mental status. She does not report any fevers or chills or sweats or weight loss.  She does not  report any chest pain shortness of breath. She had some palpitation of the past but no orthopnea or PND. Does not report any hematochezia or melena. The rest of her review of systems is unremarkable.   Medications: I have reviewed the patient's current medications.  Current Outpatient Prescriptions  Medication Sig Dispense Refill  . amLODipine (NORVASC) 5 MG tablet Take 1 tablet (5 mg total) by mouth daily. 60 tablet 12  . apixaban (ELIQUIS) 5 MG TABS tablet Take 1 tablet (5 mg total) by mouth 2 (two) times daily. 60 tablet 6  . Calcium Carb-Cholecalciferol (CALCIUM + D3 PO) Take 2 tablets by mouth daily.    . cholecalciferol (VITAMIN D) 1000 UNITS tablet Take 1,000 Units by mouth daily.    Marland Kitchen exemestane (AROMASIN) 25 MG tablet Take 1 tablet (25 mg total) by mouth daily after breakfast. 30 tablet 5  . metoprolol (LOPRESSOR) 50 MG tablet Take 1 tablet by mouth 2 (two) times daily.    Marland Kitchen MYRBETRIQ 25 MG TB24 tablet Take 1 tablet by mouth daily.    . NONFORMULARY OR COMPOUNDED ITEM Combination compound cream-[facility]tech Baclofen 2%, Doxepin 5%, Gabapentin 6%, Topiramate 2%, Pentoxifylline 3% 120 grams with 3 refills Apply 1-2 grams to affected area 3-4 times daily    . simvastatin (ZOCOR) 20 MG tablet Take 20 mg by mouth daily.    Marland Kitchen spironolactone (ALDACTONE) 25 MG tablet Take 25 mg by mouth daily.    . valsartan-hydrochlorothiazide (DIOVAN-HCT) 320-25 MG per tablet Take 1 tablet by mouth daily.     No current facility-administered medications for this visit.  Allergies:  Allergies  Allergen Reactions  . Prilosec Otc [Omeprazole Magnesium] Itching    Past Medical History, Surgical history, Social history, and Family History were reviewed and updated.  Physical Exam: Blood pressure (!) 133/59, pulse 62, temperature 98.6 F (37 C), temperature source Oral, resp. rate 18, height '5\' 3"'  (1.6 m), weight 202 lb 4.8 oz (91.8 kg), SpO2 97 %. ECOG: 0 General appearance: Well-appearing woman  without distress. Head: Normocephalic, without obvious abnormality no oral ulcers or lesions. Neck: no adenopathy Lymph nodes: Cervical, supraclavicular, and axillary nodes normal. Heart:regular rate and rhythm, S1, S2 normal, no murmur, click, rub or gallop Lung:chest clear, no wheezing, rales, normal symmetric air entry Abdomin: soft, non-tender, without masses or organomegaly no shifting dullness or ascites. EXT:no erythema, induration, or nodules.  Neurological examination no deficits.  Lab Results: Lab Results  Component Value Date   WBC 9.4 07/20/2016   HGB 14.2 07/20/2016   HCT 41.6 07/20/2016   MCV 83.7 07/20/2016   PLT 207 07/20/2016     Chemistry      Component Value Date/Time   NA 136 02/23/2016 1009   NA 138 06/16/2015 0849   K 4.7 02/23/2016 1009   K 3.7 06/16/2015 0849   CL 98 02/23/2016 1009   CO2 27 02/23/2016 1009   CO2 28 06/16/2015 0849   BUN 18 02/23/2016 1009   BUN 13.7 06/16/2015 0849   CREATININE 1.06 (H) 02/23/2016 1009   CREATININE 0.9 06/16/2015 0849      Component Value Date/Time   CALCIUM 9.6 02/23/2016 1009   CALCIUM 9.7 06/16/2015 0849   ALKPHOS 98 06/16/2015 0849   AST 14 06/16/2015 0849   ALT 14 06/16/2015 0849   BILITOT 0.68 06/16/2015 0849      I   Impression and Plan:   76 year old woman with:  1.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 She is status post right lumpectomy and her sentinel lymph node biopsy was benign. Her tumor is ER PR positive HER-2 negative. She has a low Ki-67. Her pathological staging is T1cN0. She is status post radiation therapy that was completed without any issues. She declined systemic chemotherapy.   She is currently on Aromasin after she has been switched from Arimidex. She continues to tolerate Aromasin without complications.   She is scheduled to have a repeat ultrasound because of an abnormality detected on physical exam. Her mammogram in April 2018 did not show any detectable  abnormalities. If malignancy detected, it is likely an early stage and will require surgical attention. It could also be a benign abnormality but does not require any intervention.  2. Osteoporosis: She is currently on calcium and vitamin D supplements.   3. Followup: Will be in 6 months sooner if needed to.         RPRXYV,OPFYT, MD 7/20/201810:38 AM

## 2016-07-24 ENCOUNTER — Ambulatory Visit
Admission: RE | Admit: 2016-07-24 | Discharge: 2016-07-24 | Disposition: A | Discharge status: Home / Self Care | Payer: Medicare Other | Source: Ambulatory Visit | Attending: General Surgery | Admitting: General Surgery

## 2016-07-24 DIAGNOSIS — R928 Other abnormal and inconclusive findings on diagnostic imaging of breast: Secondary | ICD-10-CM | POA: Diagnosis not present

## 2016-07-24 DIAGNOSIS — N63 Unspecified lump in unspecified breast: Secondary | ICD-10-CM

## 2016-07-24 DIAGNOSIS — N6489 Other specified disorders of breast: Secondary | ICD-10-CM | POA: Diagnosis not present

## 2016-08-20 ENCOUNTER — Other Ambulatory Visit: Payer: Self-pay | Admitting: Cardiology

## 2016-08-20 DIAGNOSIS — I4891 Unspecified atrial fibrillation: Secondary | ICD-10-CM

## 2016-08-22 ENCOUNTER — Other Ambulatory Visit: Payer: Self-pay | Admitting: Cardiology

## 2016-08-22 DIAGNOSIS — I4891 Unspecified atrial fibrillation: Secondary | ICD-10-CM

## 2016-09-14 ENCOUNTER — Encounter: Payer: Self-pay | Admitting: Cardiology

## 2016-09-14 NOTE — Progress Notes (Signed)
HPI: FU atrial fibrillation. Cardiac catheterization December 2008 showed 30% PDA but no other obstructive disease and normal LV function. Monitor 11/17 showed PAF. Echocardiogram February 2018 showed normal LV systolic function and grade 1 diastolic dysfunction. Mild LAE. TSH February 2018 0.48. Nuclear study July 2018 showed ejection fraction 66% and normal perfusion. Since last seen, she has dyspnea with more vigorous activities. No orthopnea, PND, pedal edema or chest pain. One brief episode of palpitations. No syncope or bleeding.  Current Outpatient Prescriptions  Medication Sig Dispense Refill  . amLODipine (NORVASC) 5 MG tablet Take 1 tablet (5 mg total) by mouth daily. 60 tablet 12  . Calcium Carb-Cholecalciferol (CALCIUM + D3 PO) Take 2 tablets by mouth daily.    . cholecalciferol (VITAMIN D) 1000 UNITS tablet Take 1,000 Units by mouth daily.    Marland Kitchen ELIQUIS 5 MG TABS tablet TAKE 1 TABLET (5 MG TOTAL) BY MOUTH 2 (TWO) TIMES DAILY. 180 tablet 1  . exemestane (AROMASIN) 25 MG tablet Take 1 tablet (25 mg total) by mouth daily after breakfast. 30 tablet 5  . metoprolol (LOPRESSOR) 50 MG tablet Take 1 tablet by mouth 2 (two) times daily.    Marland Kitchen MYRBETRIQ 25 MG TB24 tablet Take 1 tablet by mouth daily.    . NONFORMULARY OR COMPOUNDED ITEM Combination compound cream-[facility] Baclofen 2%, Doxepin 5%, Gabapentin 6%, Topiramate 2%, Pentoxifylline 3% 120 grams with 3 refills Apply 1-2 grams to affected area 3-4 times daily    . simvastatin (ZOCOR) 20 MG tablet Take 20 mg by mouth daily.    Marland Kitchen spironolactone (ALDACTONE) 25 MG tablet Take 25 mg by mouth daily.    . valsartan-hydrochlorothiazide (DIOVAN-HCT) 320-25 MG per tablet Take 1 tablet by mouth daily.     No current facility-administered medications for this visit.      Past Medical History:  Diagnosis Date  . Adenomatous colon polyp   . Breast cancer ([place])   . GERD (gastroesophageal reflux disease)   . HTN (hypertension)   .  Hyperlipidemia   . Radiation 08/03/13-08/24/13   right breast 42.72 gray  . Wears glasses     Past Surgical History:  Procedure Laterality Date  . APPENDECTOMY  1956  . BREAST BIOPSY Right 04/22/2013  . BREAST LUMPECTOMY Right 05/29/2013  . BREAST LUMPECTOMY WITH NEEDLE LOCALIZATION AND AXILLARY SENTINEL LYMPH NODE BX Right 05/29/2013   Procedure: BREAST LUMPECTOMY WITH NEEDLE LOCALIZATION AND AXILLARY SENTINEL LYMPH NODE BX;  Surgeon: Edward Jolly, MD;  Location: [facility];  Service: General;  Laterality: Right;  . CARDIAC CATHETERIZATION  2008  . [place]  . KNEE ARTHROSCOPY  2001   right  . RE-EXCISION OF BREAST LUMPECTOMY Right 06/12/2013   Procedure: RE-EXCISION OF BREAST LUMPECTOMY;  Surgeon: Edward Jolly, MD;  Location: [facility];  Service: General;  Laterality: Right;  . TUBAL LIGATION  1976  . UPPER GI ENDOSCOPY      Social History   Social History  . Marital status: Widowed    Spouse name: N/A  . Number of children: 2  . Years of education: N/A   Occupational History  . retired    Social History Main Topics  . Smoking status: Never Smoker  . Smokeless tobacco: Never Used  . Alcohol use No  . Drug use: No  . Sexual activity: Not on file   Other Topics Concern  . Not on file   Social History Narrative  .  No narrative on file    Family History  Problem Relation Age of Onset  . Coronary artery disease Father        MI at age 46  . Heart disease Father   . Heart disease Mother   . Hypertension Mother   . Cancer Cousin 47       had lumpectomy and radiation,  25 years ago  . Colon cancer Neg Hx   . Esophageal cancer Neg Hx   . Rectal cancer Neg Hx   . Stomach cancer Neg Hx     ROS: no fevers or chills, productive cough, hemoptysis, dysphasia, odynophagia, melena, hematochezia, dysuria, hematuria, rash, seizure activity, orthopnea, PND, pedal edema, claudication. Remaining systems are  negative.  Physical Exam: Well-developed well-nourished in no acute distress.  Skin is warm and dry.  HEENT is normal.  Neck is supple.  Chest is clear to auscultation with normal expansion.  Cardiovascular exam is regular rate and rhythm.  Abdominal exam nontender or distended. No masses palpated. Extremities show no edema. neuro grossly intact   A/P  1 paroxysmal atrial fibrillation-patient remains in sinus rhythm today. We will continue with beta blockade for rate control if atrial fibrillation recurs. Continue apixaban. If she has more frequent bouts in the future we will consider an antiarrhythmic.   2 hypertension-pressure is controlled. Continue present medications.  3 hyperlipidemia-continue statin.  Kirk Ruths, MD

## 2016-09-27 ENCOUNTER — Other Ambulatory Visit: Payer: Self-pay | Admitting: Oncology

## 2016-09-27 ENCOUNTER — Ambulatory Visit (INDEPENDENT_AMBULATORY_CARE_PROVIDER_SITE_OTHER): Payer: Medicare Other | Admitting: Cardiology

## 2016-09-27 ENCOUNTER — Encounter: Payer: Self-pay | Admitting: Cardiology

## 2016-09-27 VITALS — BP 124/64 | HR 58 | Ht 63.0 in | Wt 199.0 lb

## 2016-09-27 DIAGNOSIS — I10 Essential (primary) hypertension: Secondary | ICD-10-CM | POA: Diagnosis not present

## 2016-09-27 DIAGNOSIS — E7800 Pure hypercholesterolemia, unspecified: Secondary | ICD-10-CM

## 2016-09-27 DIAGNOSIS — I480 Paroxysmal atrial fibrillation: Secondary | ICD-10-CM | POA: Diagnosis not present

## 2016-09-27 NOTE — Patient Instructions (Signed)
Your physician wants you to follow-up in: 6 MONTHS WITH DR CRENSHAW You will receive a reminder letter in the mail two months in advance. If you don't receive a letter, please call our office to schedule the follow-up appointment.   If you need a refill on your cardiac medications before your next appointment, please call your pharmacy.  

## 2016-10-30 DIAGNOSIS — E7849 Other hyperlipidemia: Secondary | ICD-10-CM | POA: Diagnosis not present

## 2016-10-30 DIAGNOSIS — R82998 Other abnormal findings in urine: Secondary | ICD-10-CM | POA: Diagnosis not present

## 2016-10-30 DIAGNOSIS — R7301 Impaired fasting glucose: Secondary | ICD-10-CM | POA: Diagnosis not present

## 2016-10-30 DIAGNOSIS — I10 Essential (primary) hypertension: Secondary | ICD-10-CM | POA: Diagnosis not present

## 2016-11-06 DIAGNOSIS — Z6836 Body mass index (BMI) 36.0-36.9, adult: Secondary | ICD-10-CM | POA: Diagnosis not present

## 2016-11-06 DIAGNOSIS — Z23 Encounter for immunization: Secondary | ICD-10-CM | POA: Diagnosis not present

## 2016-11-06 DIAGNOSIS — I480 Paroxysmal atrial fibrillation: Secondary | ICD-10-CM | POA: Diagnosis not present

## 2016-11-06 DIAGNOSIS — E7849 Other hyperlipidemia: Secondary | ICD-10-CM | POA: Diagnosis not present

## 2016-11-06 DIAGNOSIS — R7301 Impaired fasting glucose: Secondary | ICD-10-CM | POA: Diagnosis not present

## 2016-11-06 DIAGNOSIS — Z1389 Encounter for screening for other disorder: Secondary | ICD-10-CM | POA: Diagnosis not present

## 2016-11-06 DIAGNOSIS — Z0000 Encounter for general adult medical examination without abnormal findings: Secondary | ICD-10-CM | POA: Diagnosis not present

## 2016-11-06 DIAGNOSIS — N3281 Overactive bladder: Secondary | ICD-10-CM | POA: Diagnosis not present

## 2016-11-06 DIAGNOSIS — R05 Cough: Secondary | ICD-10-CM | POA: Diagnosis not present

## 2016-11-06 DIAGNOSIS — I10 Essential (primary) hypertension: Secondary | ICD-10-CM | POA: Diagnosis not present

## 2016-11-06 DIAGNOSIS — E668 Other obesity: Secondary | ICD-10-CM | POA: Diagnosis not present

## 2016-11-08 DIAGNOSIS — Z1212 Encounter for screening for malignant neoplasm of rectum: Secondary | ICD-10-CM | POA: Diagnosis not present

## 2016-11-12 DIAGNOSIS — Z13820 Encounter for screening for osteoporosis: Secondary | ICD-10-CM | POA: Diagnosis not present

## 2017-01-18 ENCOUNTER — Telehealth: Payer: Self-pay | Admitting: Oncology

## 2017-01-18 ENCOUNTER — Inpatient Hospital Stay: Payer: Medicare Other | Attending: Oncology | Admitting: Oncology

## 2017-01-18 ENCOUNTER — Inpatient Hospital Stay: Payer: Medicare Other

## 2017-01-18 VITALS — BP 132/58 | HR 63 | Temp 98.5°F | Resp 18 | Ht 63.0 in | Wt 202.5 lb

## 2017-01-18 DIAGNOSIS — Z79811 Long term (current) use of aromatase inhibitors: Secondary | ICD-10-CM | POA: Insufficient documentation

## 2017-01-18 DIAGNOSIS — C50911 Malignant neoplasm of unspecified site of right female breast: Secondary | ICD-10-CM

## 2017-01-18 DIAGNOSIS — M1710 Unilateral primary osteoarthritis, unspecified knee: Secondary | ICD-10-CM

## 2017-01-18 DIAGNOSIS — C50919 Malignant neoplasm of unspecified site of unspecified female breast: Secondary | ICD-10-CM | POA: Diagnosis not present

## 2017-01-18 DIAGNOSIS — Z170 Estrogen receptor positive status [ER+]: Secondary | ICD-10-CM

## 2017-01-18 DIAGNOSIS — M810 Age-related osteoporosis without current pathological fracture: Secondary | ICD-10-CM

## 2017-01-18 LAB — CBC WITH DIFFERENTIAL/PLATELET
BASOS ABS: 0.1 10*3/uL (ref 0.0–0.1)
Basophils Relative: 1 %
EOS ABS: 0.3 10*3/uL (ref 0.0–0.5)
EOS PCT: 3 %
HCT: 40.9 % (ref 34.8–46.6)
Hemoglobin: 14 g/dL (ref 11.6–15.9)
Lymphocytes Relative: 23 %
Lymphs Abs: 1.9 10*3/uL (ref 0.9–3.3)
MCH: 28.4 pg (ref 25.1–34.0)
MCHC: 34.2 g/dL (ref 31.5–36.0)
MCV: 83 fL (ref 79.5–101.0)
MONO ABS: 0.5 10*3/uL (ref 0.1–0.9)
Monocytes Relative: 6 %
Neutro Abs: 5.6 10*3/uL (ref 1.5–6.5)
Neutrophils Relative %: 67 %
Platelets: 208 10*3/uL (ref 145–400)
RBC: 4.93 MIL/uL (ref 3.70–5.45)
RDW: 13.5 % (ref 11.2–16.1)
WBC: 8.4 10*3/uL (ref 3.9–10.3)

## 2017-01-18 LAB — COMPREHENSIVE METABOLIC PANEL
ALBUMIN: 3.8 g/dL (ref 3.5–5.0)
ALK PHOS: 127 U/L (ref 40–150)
ALT: 12 U/L (ref 0–55)
AST: 14 U/L (ref 5–34)
Anion gap: 10 (ref 3–11)
BILIRUBIN TOTAL: 0.6 mg/dL (ref 0.2–1.2)
BUN: 19 mg/dL (ref 7–26)
CO2: 27 mmol/L (ref 22–29)
Calcium: 10 mg/dL (ref 8.4–10.4)
Chloride: 100 mmol/L (ref 98–109)
Creatinine, Ser: 1 mg/dL (ref 0.60–1.10)
GFR calc Af Amer: >60 mL/min (ref >60)
GFR, EST NON AFRICAN AMERICAN: 53 mL/min — AB (ref >60)
GLUCOSE: 103 mg/dL (ref 70–140)
Potassium: 4.1 mmol/L (ref 3.3–4.7)
Sodium: 137 mmol/L (ref 136–145)
TOTAL PROTEIN: 7 g/dL (ref 6.4–8.3)

## 2017-01-18 NOTE — Telephone Encounter (Signed)
Gave avs and calendar for july °

## 2017-01-18 NOTE — Progress Notes (Signed)
Hematology and Oncology Follow Up Visit  Gabriella Walker [id number] Feb 11, 1940 77 y.o. 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 Tumor measured 1.8 cm, ER PR positive HER-2 negative. She has a low Ki-67. Her pathological staging is T1cN0.    Prior Therapy:  She is S/P right lumpectomy on 05/29/2013 followed by radiation therapy under the care of Dr. Sondra Come completed in August of 2015.  Current therapy:  Arimidex 1 mg daily started in September 2015. This was switched to Aromasin in October 2016 because of arthralgias and myalgias.  She continues to be disease free at this time.  Interim History: Gabriella Walker here for a follow-up visit by herself.  She continues to have issues with knee pain related to degenerative arthritis.  Her pain is worse in the morning but does improve as the day progresses.  She is able to ambulate rather slowly.  She does not require any assistive devices and had not had any falls or syncope.  She denies any pathological fractures or bone pain.  She continues to live independently and able to attend to activities of daily living.   She continues on Aromasin without any new complications.  She denied any arthralgias or myalgias.  She does not report any headaches or blurry vision or double vision. She has not reported any syncope. She does not report any seizures or alteration of mental status. She does not report any fevers or chills or sweats or weight loss.  She does not report any chest pain shortness of breath. She had some palpitation of the past but no orthopnea or PND. Does not report any hematochezia or melena.  She does not report any frequency urgency or hesitancy.  She does not report any skin rashes or lesions.  The rest of her review of systems is negative.    Medications: I have reviewed the patient's current medications.  Current  Outpatient Medications  Medication Sig Dispense Refill  . amLODipine (NORVASC) 5 MG tablet Take 1 tablet (5 mg total) by mouth daily. 60 tablet 12  . Calcium Carb-Cholecalciferol (CALCIUM + D3 PO) Take 2 tablets by mouth daily.    . cholecalciferol (VITAMIN D) 1000 UNITS tablet Take 1,000 Units by mouth daily.    Marland Kitchen ELIQUIS 5 MG TABS tablet TAKE 1 TABLET (5 MG TOTAL) BY MOUTH 2 (TWO) TIMES DAILY. 180 tablet 1  . exemestane (AROMASIN) 25 MG tablet TAKE 1 TABLET (25 MG TOTAL) BY MOUTH DAILY AFTER BREAKFAST. 30 tablet 5  . MYRBETRIQ 25 MG TB24 tablet Take 1 tablet by mouth daily.    . NONFORMULARY OR COMPOUNDED ITEM Combination compound cream-[facility] Baclofen 2%, Doxepin 5%, Gabapentin 6%, Topiramate 2%, Pentoxifylline 3% 120 grams with 3 refills Apply 1-2 grams to affected area 3-4 times daily    . simvastatin (ZOCOR) 20 MG tablet Take 20 mg by mouth daily.    . valsartan-hydrochlorothiazide (DIOVAN-HCT) 320-25 MG per tablet Take 1 tablet by mouth daily.    . metoprolol (LOPRESSOR) 50 MG tablet Take 1 tablet by mouth 2 (two) times daily.     No current facility-administered medications for this visit.      Allergies:  Allergies  Allergen Reactions  . Prilosec Otc [Omeprazole Magnesium] Itching    Past Medical History, Surgical history, Social history, and Family History reviewed today and discussed with her and updated.  Remained unchanged.Marland Kitchen  Physical Exam: Blood pressure (!) 132/58,  pulse 63, temperature 98.5 F (36.9 C), temperature source Oral, resp. rate 18, height '5\' 3"'  (1.6 m), weight 202 lb 8 oz (91.9 kg), SpO2 98 %. ECOG: 0 General appearance: Alert, awake woman without distress. Head: Normocephalic, without obvious abnormality  Oropharynx: No oral ulcers or lesions. Eyes: No scleral icterus.  Pupils are equal and round reactive to light. Lymph nodes: Cervical, supraclavicular, and axillary nodes normal. Heart:regular rate and rhythm, S1, S2 normal, no murmur, click, rub or  gallop Lung: Clear, no wheezing, rales, normal symmetric air entry Abdomin: Good bowel sounds auscultated all quadrants.  Soft without any rebound or guarding. Musculoskeletal: No joint effusion or tenderness.  He had limited range of motion in her knees. Neurological examination no deficits.  Lab Results: Lab Results  Component Value Date   WBC 8.4 01/18/2017   HGB 14.0 01/18/2017   HCT 40.9 01/18/2017   MCV 83.0 01/18/2017   PLT 208 01/18/2017     Chemistry      Component Value Date/Time   NA 138 07/20/2016 1005   K 4.0 07/20/2016 1005   CL 98 02/23/2016 1009   CO2 26 07/20/2016 1005   BUN 15.0 07/20/2016 1005   CREATININE 1.0 07/20/2016 1005      Component Value Date/Time   CALCIUM 10.0 07/20/2016 1005   ALKPHOS 131 07/20/2016 1005   AST 17 07/20/2016 1005   ALT 14 07/20/2016 1005   BILITOT 0.56 07/20/2016 1005      I   Impression and Plan:   77 year old woman with:  1. Breast cancer diagnosed in 2015: He was found to have invasive ductal carcinoma grade II/III measuring 1.8 cm.   She is status post right lumpectomy and her sentinel lymph node biopsy was benign. Her tumor is ER PR positive HER-2 negative.  She had early stage disease with low risk features.  She is currently on Aromasin after she has been switched from Arimidex.  Risks and benefits of continuing Aromasin at this time was discussed.  Complication associated with long-term aromatase inhibitors were reviewed today along with the natural course of breast cancer.  Given her low risk features, I have recommended continuing Aromasin for a total of 5 years to be concluded in September 2020.  2. Osteoporosis: She is currently on calcium and vitamin D supplements.  She will require bone density evaluation in future visits.  3.  Osteoarthritis of the knee: I recommended follow-up with orthopedics regarding this issue.  We have discussed knee replacement is a possibility although she is concerned about this  option given the fact that she lives alone.   4. Followup: Will be in 6 months.   15 minutes was spent with the patient face-to-face today.  More than 50% of time was dedicated to patient counseling, education and coordination of the patient's multifaceted care.          Zola Button, MD 1/18/201911:47 AM

## 2017-03-11 ENCOUNTER — Other Ambulatory Visit: Payer: Self-pay | Admitting: General Surgery

## 2017-03-11 DIAGNOSIS — Z139 Encounter for screening, unspecified: Secondary | ICD-10-CM

## 2017-03-12 ENCOUNTER — Other Ambulatory Visit: Payer: Self-pay | Admitting: General Surgery

## 2017-03-12 DIAGNOSIS — Z853 Personal history of malignant neoplasm of breast: Secondary | ICD-10-CM

## 2017-03-29 ENCOUNTER — Other Ambulatory Visit: Payer: Self-pay | Admitting: Oncology

## 2017-04-01 ENCOUNTER — Other Ambulatory Visit: Payer: Self-pay | Admitting: Cardiology

## 2017-04-01 DIAGNOSIS — I4891 Unspecified atrial fibrillation: Secondary | ICD-10-CM

## 2017-04-04 NOTE — Progress Notes (Signed)
HPI: FU atrial fibrillation. Cardiac catheterization December 2008 showed 30% PDA but no other obstructive disease and normal LV function. Monitor 11/17 showed PAF. Echocardiogram February 2018 showed normal LV systolic function and grade 1 diastolic dysfunction. Mild LAE. TSH February 2018 0.48. Nuclear study July 2018 showed ejection fraction 66% and normal perfusion. Since last seen,  this morning she developed recurrent atrial fibrillation.  This is described as palpitations.  She has some dizziness with this but no chest pain or syncope.  She has mild dyspnea on exertion but no orthopnea or PND.  Minimal pedal edema.   Current Outpatient Medications  Medication Sig Dispense Refill  . amLODipine (NORVASC) 5 MG tablet Take 1 tablet (5 mg total) by mouth daily. 60 tablet 12  . Calcium Carb-Cholecalciferol (CALCIUM + D3 PO) Take 2 tablets by mouth daily.    . cholecalciferol (VITAMIN D) 1000 UNITS tablet Take 1,000 Units by mouth daily.    Marland Kitchen ELIQUIS 5 MG TABS tablet TAKE 1 TABLET BY MOUTH TWICE A DAY 180 tablet 1  . exemestane (AROMASIN) 25 MG tablet TAKE 1 TABLET (25 MG TOTAL) BY MOUTH DAILY AFTER BREAKFAST. 30 tablet 5  . metoprolol (LOPRESSOR) 50 MG tablet Take 1 tablet by mouth 2 (two) times daily.    Marland Kitchen MYRBETRIQ 25 MG TB24 tablet Take 1 tablet by mouth daily.    . simvastatin (ZOCOR) 20 MG tablet Take 20 mg by mouth daily.    Marland Kitchen telmisartan-hydrochlorothiazide (MICARDIS HCT) 80-25 MG tablet Take 1 tablet by mouth daily.     No current facility-administered medications for this visit.      Past Medical History:  Diagnosis Date  . Adenomatous colon polyp   . Breast cancer ([place])   . GERD (gastroesophageal reflux disease)   . HTN (hypertension)   . Hyperlipidemia   . Radiation 08/03/13-08/24/13   right breast 42.72 gray  . Wears glasses     Past Surgical History:  Procedure Laterality Date  . APPENDECTOMY  1956  . BREAST BIOPSY Right 04/22/2013  . BREAST LUMPECTOMY Right  05/29/2013  . BREAST LUMPECTOMY WITH NEEDLE LOCALIZATION AND AXILLARY SENTINEL LYMPH NODE BX Right 05/29/2013   Procedure: BREAST LUMPECTOMY WITH NEEDLE LOCALIZATION AND AXILLARY SENTINEL LYMPH NODE BX;  Surgeon: Edward Jolly, MD;  Location: [facility];  Service: General;  Laterality: Right;  . CARDIAC CATHETERIZATION  2008  . [place]  . KNEE ARTHROSCOPY  2001   right  . RE-EXCISION OF BREAST LUMPECTOMY Right 06/12/2013   Procedure: RE-EXCISION OF BREAST LUMPECTOMY;  Surgeon: Edward Jolly, MD;  Location: [facility];  Service: General;  Laterality: Right;  . TUBAL LIGATION  1976  . UPPER GI ENDOSCOPY      Social History   Socioeconomic History  . Marital status: Widowed    Spouse name: Not on file  . Number of children: 2  . Years of education: Not on file  . Highest education level: Not on file  Occupational History  . Occupation: retired  Scientific laboratory technician  . Financial resource strain: Not on file  . Food insecurity:    Worry: Not on file    Inability: Not on file  . Transportation needs:    Medical: Not on file    Non-medical: Not on file  Tobacco Use  . Smoking status: Never Smoker  . Smokeless tobacco: Never Used  Substance and Sexual Activity  . Alcohol use: No  . Drug  use: No  . Sexual activity: Not on file  Lifestyle  . Physical activity:    Days per week: Not on file    Minutes per session: Not on file  . Stress: Not on file  Relationships  . Social connections:    Talks on phone: Not on file    Gets together: Not on file    Attends religious service: Not on file    Active member of club or organization: Not on file    Attends meetings of clubs or organizations: Not on file    Relationship status: Not on file  . Intimate partner violence:    Fear of current or ex partner: Not on file    Emotionally abused: Not on file    Physically abused: Not on file    Forced sexual activity: Not on file    Other Topics Concern  . Not on file  Social History Narrative  . Not on file    Family History  Problem Relation Age of Onset  . Coronary artery disease Father        MI at age 54  . Heart disease Father   . Heart disease Mother   . Hypertension Mother   . Cancer Cousin 47       had lumpectomy and radiation,  25 years ago  . Colon cancer Neg Hx   . Esophageal cancer Neg Hx   . Rectal cancer Neg Hx   . Stomach cancer Neg Hx     ROS: no fevers or chills, productive cough, hemoptysis, dysphasia, odynophagia, melena, hematochezia, dysuria, hematuria, rash, seizure activity, orthopnea, PND, pedal edema, claudication. Remaining systems are negative.  Physical Exam: Well-developed well-nourished in no acute distress.  Skin is warm and dry.  HEENT is normal.  Neck is supple.  Chest is clear to auscultation with normal expansion.  Cardiovascular exam is irregular Abdominal exam nontender or distended. No masses palpated. Extremities show no edema. neuro grossly intact  ECG-atrial fibrillation at a rate of 111.  Occasional PVC or aberrantly conducted beat.  Nonspecific ST changes.  Personally reviewed  A/P  1 paroxysmal atrial fibrillation-patient is in atrial fibrillation today.  This began about 6 AM.  Continue apixaban.  She has not missed any doses.  Her episodes typically convert after several hours spontaneously.  She took an extra metoprolol this morning.  If she does not convert I have asked her to call us and we will arrange a cardioversion in the next 24-48 hours.  Continue metoprolol for rate control if atrial fibrillation recurs.  She is having these every 2 months.  We discussed adding an antiarrhythmic but she would prefer to continue with present management strategy.  We will consider addition of flecainide in the future with more frequent episodes.  2 hypertension-blood pressure is controlled.  Continue present medications.  3 hyperlipidemia-continue statin.  Kirk Ruths, MD

## 2017-04-10 ENCOUNTER — Encounter: Payer: Self-pay | Admitting: Cardiology

## 2017-04-10 ENCOUNTER — Ambulatory Visit (INDEPENDENT_AMBULATORY_CARE_PROVIDER_SITE_OTHER): Payer: Medicare Other | Admitting: Cardiology

## 2017-04-10 VITALS — BP 134/72 | HR 11 | Ht 63.0 in | Wt 205.4 lb

## 2017-04-10 DIAGNOSIS — E7800 Pure hypercholesterolemia, unspecified: Secondary | ICD-10-CM | POA: Diagnosis not present

## 2017-04-10 DIAGNOSIS — I10 Essential (primary) hypertension: Secondary | ICD-10-CM | POA: Diagnosis not present

## 2017-04-10 DIAGNOSIS — I480 Paroxysmal atrial fibrillation: Secondary | ICD-10-CM | POA: Diagnosis not present

## 2017-04-10 NOTE — Patient Instructions (Signed)
Medication Instructions:   NO CHANGE.   Follow-Up: Your physician recommends that you schedule a follow-up appointment in: 3 MONTHS WITH DR CRENSHAW.   If you need a refill on your cardiac medications before your next appointment, please call your pharmacy.   

## 2017-04-17 ENCOUNTER — Ambulatory Visit
Admission: RE | Admit: 2017-04-17 | Discharge: 2017-04-17 | Disposition: A | Discharge status: Home / Self Care | Payer: Medicare Other | Source: Ambulatory Visit | Attending: General Surgery | Admitting: General Surgery

## 2017-04-17 DIAGNOSIS — R928 Other abnormal and inconclusive findings on diagnostic imaging of breast: Secondary | ICD-10-CM | POA: Diagnosis not present

## 2017-04-17 DIAGNOSIS — Z853 Personal history of malignant neoplasm of breast: Secondary | ICD-10-CM

## 2017-04-26 ENCOUNTER — Other Ambulatory Visit: Payer: Self-pay | Admitting: Oncology

## 2017-04-26 DIAGNOSIS — H2513 Age-related nuclear cataract, bilateral: Secondary | ICD-10-CM | POA: Diagnosis not present

## 2017-05-24 ENCOUNTER — Other Ambulatory Visit: Payer: Self-pay | Admitting: Oncology

## 2017-06-02 DIAGNOSIS — A932 Colorado tick fever: Secondary | ICD-10-CM | POA: Diagnosis not present

## 2017-06-20 ENCOUNTER — Other Ambulatory Visit: Payer: Self-pay | Admitting: Oncology

## 2017-07-05 NOTE — Progress Notes (Signed)
HPI: FU atrial fibrillation. Cardiac catheterization December 2008 showed 30% PDA but no other obstructive disease and normal LV function. Monitor 11/17 showed PAF. Echocardiogram February 2018 showed normal LV systolic function and grade 1 diastolic dysfunction. Mild LAE. TSH February 2018 0.48.Nuclear study July 2018 showed ejection fraction 66% and normal perfusion.Pt in atrial fibrillation at last ov but spontaneously converted to sinus. Since last seen, she has DOE unchanged; mild pedal edema; no chest pain. Brief palpitations; no bleeding or syncope  Current Outpatient Medications  Medication Sig Dispense Refill  . amLODipine (NORVASC) 5 MG tablet Take 1 tablet (5 mg total) by mouth daily. 60 tablet 12  . Calcium Carb-Cholecalciferol (CALCIUM + D3 PO) Take 2 tablets by mouth daily.    . cholecalciferol (VITAMIN D) 1000 UNITS tablet Take 1,000 Units by mouth daily.    Marland Kitchen ELIQUIS 5 MG TABS tablet TAKE 1 TABLET BY MOUTH TWICE A DAY 180 tablet 1  . exemestane (AROMASIN) 25 MG tablet TAKE 1 TABLET EVERY DAY AFTER BREAKFAST 30 tablet 0  . metoprolol (LOPRESSOR) 50 MG tablet Take 1 tablet by mouth 2 (two) times daily.    Marland Kitchen MYRBETRIQ 25 MG TB24 tablet Take 1 tablet by mouth daily.    . simvastatin (ZOCOR) 20 MG tablet Take 20 mg by mouth daily.    Marland Kitchen telmisartan-hydrochlorothiazide (MICARDIS HCT) 80-25 MG tablet Take 1 tablet by mouth daily.     No current facility-administered medications for this visit.      Past Medical History:  Diagnosis Date  . Adenomatous colon polyp   . Breast cancer ([place]) 2015   Right Breast Cancer  . GERD (gastroesophageal reflux disease)   . HTN (hypertension)   . Hyperlipidemia   . Radiation 08/03/13-08/24/13   right breast 42.72 gray  . Wears glasses     Past Surgical History:  Procedure Laterality Date  . APPENDECTOMY  1956  . BREAST BIOPSY Right 04/22/2013  . BREAST LUMPECTOMY Right 05/29/2013  . BREAST LUMPECTOMY WITH NEEDLE LOCALIZATION AND  AXILLARY SENTINEL LYMPH NODE BX Right 05/29/2013   Procedure: BREAST LUMPECTOMY WITH NEEDLE LOCALIZATION AND AXILLARY SENTINEL LYMPH NODE BX;  Surgeon: Edward Jolly, MD;  Location: [facility];  Service: General;  Laterality: Right;  . CARDIAC CATHETERIZATION  2008  . [place]  . KNEE ARTHROSCOPY  2001   right  . RE-EXCISION OF BREAST LUMPECTOMY Right 06/12/2013   Procedure: RE-EXCISION OF BREAST LUMPECTOMY;  Surgeon: Edward Jolly, MD;  Location: [facility];  Service: General;  Laterality: Right;  . TUBAL LIGATION  1976  . UPPER GI ENDOSCOPY      Social History   Socioeconomic History  . Marital status: Widowed    Spouse name: Not on file  . Number of children: 2  . Years of education: Not on file  . Highest education level: Not on file  Occupational History  . Occupation: retired  Scientific laboratory technician  . Financial resource strain: Not on file  . Food insecurity:    Worry: Not on file    Inability: Not on file  . Transportation needs:    Medical: Not on file    Non-medical: Not on file  Tobacco Use  . Smoking status: Never Smoker  . Smokeless tobacco: Never Used  Substance and Sexual Activity  . Alcohol use: No  . Drug use: No  . Sexual activity: Not on file  Lifestyle  . Physical activity:  Days per week: Not on file    Minutes per session: Not on file  . Stress: Not on file  Relationships  . Social connections:    Talks on phone: Not on file    Gets together: Not on file    Attends religious service: Not on file    Active member of club or organization: Not on file    Attends meetings of clubs or organizations: Not on file    Relationship status: Not on file  . Intimate partner violence:    Fear of current or ex partner: Not on file    Emotionally abused: Not on file    Physically abused: Not on file    Forced sexual activity: Not on file  Other Topics Concern  . Not on file  Social History Narrative    . Not on file    Family History  Problem Relation Age of Onset  . Coronary artery disease Father        MI at age 56  . Heart disease Father   . Heart disease Mother   . Hypertension Mother   . Cancer Cousin 47       had lumpectomy and radiation,  25 years ago  . Colon cancer Neg Hx   . Esophageal cancer Neg Hx   . Rectal cancer Neg Hx   . Stomach cancer Neg Hx     ROS: fatigue and arthritis but no fevers or chills, productive cough, hemoptysis, dysphasia, odynophagia, melena, hematochezia, dysuria, hematuria, rash, seizure activity, orthopnea, PND, claudication. Remaining systems are negative.  Physical Exam: Well-developed well-nourished in no acute distress.  Skin is warm and dry.  HEENT is normal.  Neck is supple.  Chest is clear to auscultation with normal expansion.  Cardiovascular exam is regular rate and rhythm.  Abdominal exam nontender or distended. No masses palpated. Extremities show no edema. neuro grossly intact   A/P  1 paroxysmal atrial fibrillation-patient is in sinus rhythm today.  Continue apixaban at present dose.  Continue metoprolol.  If she has more frequent episodes in the future we will consider the addition of antiarrhythmic therapy versus referral for ablation.  2 hypertension-blood pressure is controlled.  Continue present medications.  3 hyperlipidemia-continue statin.  Kirk Ruths, MD

## 2017-07-12 ENCOUNTER — Encounter: Payer: Self-pay | Admitting: Cardiology

## 2017-07-12 ENCOUNTER — Ambulatory Visit (INDEPENDENT_AMBULATORY_CARE_PROVIDER_SITE_OTHER): Payer: Medicare Other | Admitting: Cardiology

## 2017-07-12 VITALS — BP 128/70 | HR 64 | Ht 63.0 in | Wt 201.8 lb

## 2017-07-12 DIAGNOSIS — I480 Paroxysmal atrial fibrillation: Secondary | ICD-10-CM

## 2017-07-12 DIAGNOSIS — E7800 Pure hypercholesterolemia, unspecified: Secondary | ICD-10-CM | POA: Diagnosis not present

## 2017-07-12 DIAGNOSIS — I10 Essential (primary) hypertension: Secondary | ICD-10-CM | POA: Diagnosis not present

## 2017-07-12 NOTE — Patient Instructions (Signed)
Your physician wants you to follow-up in: 6 MONTHS WITH DR CRENSHAW You will receive a reminder letter in the mail two months in advance. If you don't receive a letter, please call our office to schedule the follow-up appointment.   If you need a refill on your cardiac medications before your next appointment, please call your pharmacy.  

## 2017-07-15 ENCOUNTER — Other Ambulatory Visit: Payer: Self-pay | Admitting: Oncology

## 2017-07-18 ENCOUNTER — Telehealth: Payer: Self-pay

## 2017-07-18 ENCOUNTER — Inpatient Hospital Stay: Payer: Medicare Other | Attending: Oncology | Admitting: Oncology

## 2017-07-18 VITALS — BP 126/64 | HR 68 | Temp 98.1°F | Resp 17 | Ht 63.0 in | Wt 201.3 lb

## 2017-07-18 DIAGNOSIS — M549 Dorsalgia, unspecified: Secondary | ICD-10-CM | POA: Diagnosis not present

## 2017-07-18 DIAGNOSIS — M810 Age-related osteoporosis without current pathological fracture: Secondary | ICD-10-CM | POA: Diagnosis not present

## 2017-07-18 DIAGNOSIS — M1990 Unspecified osteoarthritis, unspecified site: Secondary | ICD-10-CM | POA: Diagnosis not present

## 2017-07-18 DIAGNOSIS — Z79811 Long term (current) use of aromatase inhibitors: Secondary | ICD-10-CM | POA: Diagnosis not present

## 2017-07-18 DIAGNOSIS — C50911 Malignant neoplasm of unspecified site of right female breast: Secondary | ICD-10-CM | POA: Insufficient documentation

## 2017-07-18 DIAGNOSIS — Z79899 Other long term (current) drug therapy: Secondary | ICD-10-CM | POA: Diagnosis not present

## 2017-07-18 DIAGNOSIS — Z170 Estrogen receptor positive status [ER+]: Secondary | ICD-10-CM | POA: Insufficient documentation

## 2017-07-18 NOTE — Progress Notes (Signed)
Hematology and Oncology Follow Up Visit  Gabriella Walker [id number] August 06, 1940 77 y.o. 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  Her tumor is ER PR positive HER-2 negative.    Prior Therapy:   She is S/P right lumpectomy on 05/29/2013 followed by radiation therapy under the care of Dr. Sondra Come completed in August of 2015.  Current therapy:  Arimidex 1 mg daily started in September 2015. This was switched to Aromasin in October 2016 because of arthralgias and myalgias.    Interim History: Gabriella Walker presents today for a follow-up.  Last visit, she reports no major complaints at this time.  She continues to have issues with back pain related to arthritis.  Her pain is predominantly with activity and she has to stop after a period of time.  She denies any recent falls or syncope.  She denies any hospitalization.  She continues to tolerate Aromasin without any complications.  She denies any arthralgias or myalgias.  She does not report any headaches or blurry vision or double vision.  She denies any alteration in mental status or confusion.  She does not report any fevers or chills or sweats or weight loss.  She does not report any chest pain shortness of breath. She had some palpitation of the past but no orthopnea or PND. Does not report any hematochezia or melena.  He denies any nausea or normal distention.  She does not report any frequency urgency or hesitancy.  She does not report any skin rashes or lesions.  He denies any lymphadenopathy or petechiae.  The rest of her review of systems is negative.    Medications: I have reviewed the patient's current medications.  Current Outpatient Medications  Medication Sig Dispense Refill  . amLODipine (NORVASC) 5 MG tablet Take 1 tablet (5 mg total) by mouth daily. 60 tablet 12  . Calcium Carb-Cholecalciferol (CALCIUM + D3  PO) Take 2 tablets by mouth daily.    . cholecalciferol (VITAMIN D) 1000 UNITS tablet Take 1,000 Units by mouth daily.    Marland Kitchen ELIQUIS 5 MG TABS tablet TAKE 1 TABLET BY MOUTH TWICE A DAY 180 tablet 1  . exemestane (AROMASIN) 25 MG tablet TAKE 1 TABLET EVERY DAY AFTER BREAKFAST 30 tablet 0  . metoprolol (LOPRESSOR) 50 MG tablet Take 1 tablet by mouth 2 (two) times daily.    Marland Kitchen MYRBETRIQ 25 MG TB24 tablet Take 1 tablet by mouth daily.    . simvastatin (ZOCOR) 20 MG tablet Take 20 mg by mouth daily.    Marland Kitchen telmisartan-hydrochlorothiazide (MICARDIS HCT) 80-25 MG tablet Take 1 tablet by mouth daily.     No current facility-administered medications for this visit.      Allergies:  Allergies  Allergen Reactions  . Prilosec Otc [Omeprazole Magnesium] Itching    Past Medical History, Surgical history, Social history, and Family History reviewed today and discussed with her and updated.  Remained unchanged.Marland Kitchen  Physical Exam: Blood pressure 126/64, pulse 68, temperature 98.1 F (36.7 C), temperature source Oral, resp. rate 17, height '5\' 3"'  (1.6 m), weight 201 lb 4.8 oz (91.3 kg), SpO2 96 %.   ECOG: 0 General appearance: Well-appearing woman without distress. Head: Atraumatic without abnormalities. Oropharynx: Mucous membranes are moist and pink. Eyes: Pupils are equal and round reactive to light. Lymph nodes: No lymphadenopathy noted in the cervical, supraclavicular, or axillary nodes. Heart:regular rate and rhythm, without any murmurs  or gallops. Lung: Clear without any rhonchi, wheezes or dullness to percussion. Abdomin: Soft, nontender without any rebound or guarding.  No shifting dullness or ascites. Musculoskeletal: No clubbing or cyanosis noted. Neurological: No motor or sensory deficits noted.  Lab Results: Lab Results  Component Value Date   WBC 8.4 01/18/2017   HGB 14.0 01/18/2017   HCT 40.9 01/18/2017   MCV 83.0 01/18/2017   PLT 208 01/18/2017     Chemistry      Component  Value Date/Time   NA 137 01/18/2017 1112   NA 138 07/20/2016 1005   K 4.1 01/18/2017 1112   K 4.0 07/20/2016 1005   CL 100 01/18/2017 1112   CO2 27 01/18/2017 1112   CO2 26 07/20/2016 1005   BUN 19 01/18/2017 1112   BUN 15.0 07/20/2016 1005   CREATININE 1.00 01/18/2017 1112   CREATININE 1.0 07/20/2016 1005      Component Value Date/Time   CALCIUM 10.0 01/18/2017 1112   CALCIUM 10.0 07/20/2016 1005   ALKPHOS 127 01/18/2017 1112   ALKPHOS 131 07/20/2016 1005   AST 14 01/18/2017 1112   AST 17 07/20/2016 1005   ALT 12 01/18/2017 1112   ALT 14 07/20/2016 1005   BILITOT 0.6 01/18/2017 1112   BILITOT 0.56 07/20/2016 1005      I   Impression and Plan:   77 year old woman with:  1.  T1cNo breast cancer diagnosed in 2015.  Her tumor is ER, PR positive HER-2 negative.  She completed lumpectomy as well as adjuvant radiation therapy.  She is currently on Aromasin after she has been switched from Arimidex.  She reports no complications related to this medication at this time.  Risks and benefits of continuing this treatment was reviewed today and she is agreeable to continue.  Plan is to discontinue his treatment after September 2020   2. Osteoporosis: She is currently on calcium and vitamin D supplements.  .  3.  Osteoarthritis of the knee: Pain is manageable at this time.  4. Followup: Will be in 6 months.   15 minutes was spent with the patient face-to-face today.  More than 50% of time was dedicated to patient counseling, education and discussing the natural course of this disease as well as future treatment options.          Gabriella Button, MD 7/18/20199:50 AM

## 2017-07-18 NOTE — Telephone Encounter (Signed)
Printed avs and calender of upcoming. Per 7/18 los

## 2017-08-07 DIAGNOSIS — C50911 Malignant neoplasm of unspecified site of right female breast: Secondary | ICD-10-CM | POA: Diagnosis not present

## 2017-08-08 ENCOUNTER — Other Ambulatory Visit: Payer: Self-pay | Admitting: Oncology

## 2017-08-08 DIAGNOSIS — C50911 Malignant neoplasm of unspecified site of right female breast: Secondary | ICD-10-CM

## 2017-10-02 ENCOUNTER — Other Ambulatory Visit: Payer: Self-pay | Admitting: Cardiology

## 2017-10-02 DIAGNOSIS — I4891 Unspecified atrial fibrillation: Secondary | ICD-10-CM

## 2017-11-08 DIAGNOSIS — I10 Essential (primary) hypertension: Secondary | ICD-10-CM | POA: Diagnosis not present

## 2017-11-08 DIAGNOSIS — R7301 Impaired fasting glucose: Secondary | ICD-10-CM | POA: Diagnosis not present

## 2017-11-08 DIAGNOSIS — E7849 Other hyperlipidemia: Secondary | ICD-10-CM | POA: Diagnosis not present

## 2017-11-08 DIAGNOSIS — M859 Disorder of bone density and structure, unspecified: Secondary | ICD-10-CM | POA: Diagnosis not present

## 2017-11-12 DIAGNOSIS — Z23 Encounter for immunization: Secondary | ICD-10-CM | POA: Diagnosis not present

## 2017-11-12 DIAGNOSIS — E7849 Other hyperlipidemia: Secondary | ICD-10-CM | POA: Diagnosis not present

## 2017-11-12 DIAGNOSIS — Z6836 Body mass index (BMI) 36.0-36.9, adult: Secondary | ICD-10-CM | POA: Diagnosis not present

## 2017-11-12 DIAGNOSIS — I10 Essential (primary) hypertension: Secondary | ICD-10-CM | POA: Diagnosis not present

## 2017-11-12 DIAGNOSIS — M859 Disorder of bone density and structure, unspecified: Secondary | ICD-10-CM | POA: Diagnosis not present

## 2017-11-12 DIAGNOSIS — R05 Cough: Secondary | ICD-10-CM | POA: Diagnosis not present

## 2017-11-12 DIAGNOSIS — R7301 Impaired fasting glucose: Secondary | ICD-10-CM | POA: Diagnosis not present

## 2017-11-12 DIAGNOSIS — R82998 Other abnormal findings in urine: Secondary | ICD-10-CM | POA: Diagnosis not present

## 2017-11-12 DIAGNOSIS — I480 Paroxysmal atrial fibrillation: Secondary | ICD-10-CM | POA: Diagnosis not present

## 2017-11-12 DIAGNOSIS — D72829 Elevated white blood cell count, unspecified: Secondary | ICD-10-CM | POA: Diagnosis not present

## 2017-11-12 DIAGNOSIS — Z853 Personal history of malignant neoplasm of breast: Secondary | ICD-10-CM | POA: Diagnosis not present

## 2017-11-12 DIAGNOSIS — Z0000 Encounter for general adult medical examination without abnormal findings: Secondary | ICD-10-CM | POA: Diagnosis not present

## 2017-11-12 DIAGNOSIS — R5383 Other fatigue: Secondary | ICD-10-CM | POA: Diagnosis not present

## 2017-11-12 DIAGNOSIS — Z1389 Encounter for screening for other disorder: Secondary | ICD-10-CM | POA: Diagnosis not present

## 2017-11-15 DIAGNOSIS — Z1212 Encounter for screening for malignant neoplasm of rectum: Secondary | ICD-10-CM | POA: Diagnosis not present

## 2017-12-19 DIAGNOSIS — I480 Paroxysmal atrial fibrillation: Secondary | ICD-10-CM | POA: Diagnosis not present

## 2017-12-19 DIAGNOSIS — M25511 Pain in right shoulder: Secondary | ICD-10-CM | POA: Diagnosis not present

## 2017-12-19 DIAGNOSIS — Z6836 Body mass index (BMI) 36.0-36.9, adult: Secondary | ICD-10-CM | POA: Diagnosis not present

## 2017-12-19 DIAGNOSIS — I10 Essential (primary) hypertension: Secondary | ICD-10-CM | POA: Diagnosis not present

## 2018-01-05 ENCOUNTER — Encounter: Payer: Self-pay | Admitting: Gastroenterology

## 2018-01-06 DIAGNOSIS — D72829 Elevated white blood cell count, unspecified: Secondary | ICD-10-CM | POA: Diagnosis not present

## 2018-01-08 DIAGNOSIS — M25511 Pain in right shoulder: Secondary | ICD-10-CM | POA: Diagnosis not present

## 2018-01-14 NOTE — Progress Notes (Signed)
HPI: FU atrial fibrillation. Cardiac catheterization December 2008 showed 30% PDA but no other obstructive disease and normal LV function. Monitor 11/17 showed PAF. Echocardiogram February 2018 showed normal LV systolic function and grade 1 diastolic dysfunction. Mild LAE. TSH February 2018 0.48.Nuclear study July 2018 showed ejection fraction 66% and normal perfusion.Pt in atrial fibrillation at last ov but spontaneously converted to sinus. Since last seen,she has dyspnea on exertion but no orthopnea or PND.  No chest pain or syncope.  She has occasional episodes of atrial fibrillation for several hours.  Current Outpatient Medications  Medication Sig Dispense Refill  . amLODipine (NORVASC) 5 MG tablet Take 1 tablet (5 mg total) by mouth daily. 60 tablet 12  . Calcium Carb-Cholecalciferol (CALCIUM + D3 PO) Take 2 tablets by mouth daily.    . cholecalciferol (VITAMIN D) 1000 UNITS tablet Take 1,000 Units by mouth daily.    Marland Kitchen ELIQUIS 5 MG TABS tablet TAKE 1 TABLET BY MOUTH TWICE A DAY 180 tablet 1  . exemestane (AROMASIN) 25 MG tablet TAKE 1 TABLET EVERY DAY AFTER BREAKFAST 90 tablet 1  . metoprolol (LOPRESSOR) 50 MG tablet Take 1 tablet by mouth 2 (two) times daily.    Marland Kitchen MYRBETRIQ 25 MG TB24 tablet Take 1 tablet by mouth daily.    . simvastatin (ZOCOR) 20 MG tablet Take 20 mg by mouth daily.    Marland Kitchen telmisartan-hydrochlorothiazide (MICARDIS HCT) 80-25 MG tablet Take 1 tablet by mouth daily.     No current facility-administered medications for this visit.      Past Medical History:  Diagnosis Date  . Adenomatous colon polyp   . Breast cancer ([place]) 2015   Right Breast Cancer  . GERD (gastroesophageal reflux disease)   . HTN (hypertension)   . Hyperlipidemia   . Radiation 08/03/13-08/24/13   right breast 42.72 gray  . Wears glasses     Past Surgical History:  Procedure Laterality Date  . APPENDECTOMY  1956  . BREAST BIOPSY Right 04/22/2013  . BREAST LUMPECTOMY Right 05/29/2013    . BREAST LUMPECTOMY WITH NEEDLE LOCALIZATION AND AXILLARY SENTINEL LYMPH NODE BX Right 05/29/2013   Procedure: BREAST LUMPECTOMY WITH NEEDLE LOCALIZATION AND AXILLARY SENTINEL LYMPH NODE BX;  Surgeon: Edward Jolly, MD;  Location: [place];  Service: General;  Laterality: Right;  . CARDIAC CATHETERIZATION  2008  . [place]  . KNEE ARTHROSCOPY  2001   right  . RE-EXCISION OF BREAST LUMPECTOMY Right 06/12/2013   Procedure: RE-EXCISION OF BREAST LUMPECTOMY;  Surgeon: Edward Jolly, MD;  Location: [place];  Service: General;  Laterality: Right;  . TUBAL LIGATION  1976  . UPPER GI ENDOSCOPY      Social History   Socioeconomic History  . Marital status: Widowed    Spouse name: Not on file  . Number of children: 2  . Years of education: Not on file  . Highest education level: Not on file  Occupational History  . Occupation: retired  Scientific laboratory technician  . Financial resource strain: Not on file  . Food insecurity:    Worry: Not on file    Inability: Not on file  . Transportation needs:    Medical: Not on file    Non-medical: Not on file  Tobacco Use  . Smoking status: Never Smoker  . Smokeless tobacco: Never Used  Substance and Sexual Activity  . Alcohol use: No  . Drug use: No  . Sexual activity:  Not on file  Lifestyle  . Physical activity:    Days per week: Not on file    Minutes per session: Not on file  . Stress: Not on file  Relationships  . Social connections:    Talks on phone: Not on file    Gets together: Not on file    Attends religious service: Not on file    Active member of club or organization: Not on file    Attends meetings of clubs or organizations: Not on file    Relationship status: Not on file  . Intimate partner violence:    Fear of current or ex partner: Not on file    Emotionally abused: Not on file    Physically abused: Not on file    Forced sexual activity: Not on file  Other Topics  Concern  . Not on file  Social History Narrative  . Not on file    Family History  Problem Relation Age of Onset  . Coronary artery disease Father        MI at age 67  . Heart disease Father   . Heart disease Mother   . Hypertension Mother   . Cancer Cousin 47       had lumpectomy and radiation,  25 years ago  . Colon cancer Neg Hx   . Esophageal cancer Neg Hx   . Rectal cancer Neg Hx   . Stomach cancer Neg Hx     ROS: no fevers or chills, productive cough, hemoptysis, dysphasia, odynophagia, melena, hematochezia, dysuria, hematuria, rash, seizure activity, orthopnea, PND, pedal edema, claudication. Remaining systems are negative.  Physical Exam: Well-developed well-nourished in no acute distress.  Skin is warm and dry.  HEENT is normal.  Neck is supple.  Chest is clear to auscultation with normal expansion.  Cardiovascular exam is regular rate and rhythm.  Abdominal exam nontender or distended. No masses palpated. Extremities show no edema. neuro grossly intact  ECG-sinus rhythm with occasional PAC.  Personally reviewed  A/P  1 paroxysmal atrial fibrillation-patient remains in sinus rhythm on examination.  Continue metoprolol for rate control if atrial fibrillation recurs.  Continue apixaban.  Her episodes are relatively short-lived.  If they worsen we will consider referral for ablation versus antiarrhythmic therapy.  I will refer for sleep evaluation to see if sleep apnea may be contributing to increased frequency of atrial fibrillation.  2 hypertension-patient's blood pressure is elevated.  Increase amlodipine to 10 mg daily and follow.  3 hyperlipidemia-continue statin.  Kirk Ruths, MD

## 2018-01-16 ENCOUNTER — Encounter: Payer: Self-pay | Admitting: Cardiology

## 2018-01-16 ENCOUNTER — Ambulatory Visit (INDEPENDENT_AMBULATORY_CARE_PROVIDER_SITE_OTHER): Payer: Medicare Other | Admitting: Cardiology

## 2018-01-16 VITALS — BP 152/72 | HR 64 | Ht 63.0 in | Wt 199.4 lb

## 2018-01-16 DIAGNOSIS — I10 Essential (primary) hypertension: Secondary | ICD-10-CM

## 2018-01-16 DIAGNOSIS — I4891 Unspecified atrial fibrillation: Secondary | ICD-10-CM | POA: Diagnosis not present

## 2018-01-16 DIAGNOSIS — E7800 Pure hypercholesterolemia, unspecified: Secondary | ICD-10-CM | POA: Diagnosis not present

## 2018-01-16 DIAGNOSIS — I480 Paroxysmal atrial fibrillation: Secondary | ICD-10-CM

## 2018-01-16 DIAGNOSIS — R0683 Snoring: Secondary | ICD-10-CM | POA: Diagnosis not present

## 2018-01-16 MED ORDER — AMLODIPINE BESYLATE 10 MG PO TABS: 10.0000 mg | ORAL_TABLET | Freq: Every day | ORAL | 3 refills | Status: DC

## 2018-01-16 NOTE — Patient Instructions (Signed)
Medication Instructions:  INCREASE AMLODIPINE TO 10 MG ONCE DAILY= 2 OF THE 5 MG TABLETS ONCE DAILY If you need a refill on your cardiac medications before your next appointment, please call your pharmacy.   Lab work: If you have labs (blood work) drawn today and your tests are completely normal, you will receive your results only by: Marland Kitchen MyChart Message (if you have MyChart) OR . A paper copy in the mail If you have any lab test that is abnormal or we need to change your treatment, we will call you to review the results.  Testing/Procedures: Your physician has recommended that you have a sleep study. This test records several body functions during sleep, including: brain activity, eye movement, oxygen and carbon dioxide blood levels, heart rate and rhythm, breathing rate and rhythm, the flow of air through your mouth and nose, snoring, body muscle movements, and chest and belly movement.    Follow-Up: At [facility], you and your health needs are our priority.  As part of our continuing mission to provide you with exceptional heart care, we have created designated Provider Care Teams.  These Care Teams include your primary Cardiologist (physician) and Advanced Practice Providers (APPs -  Physician Assistants and Nurse Practitioners) who all work together to provide you with the care you need, when you need it. . Your physician recommends that you schedule a follow-up appointment in: [place] . CALL IN MAY TO SCHEDULE APPOINTMENT IN [place]

## 2018-01-17 ENCOUNTER — Inpatient Hospital Stay: Payer: Medicare Other | Attending: Oncology | Admitting: Oncology

## 2018-01-17 ENCOUNTER — Telehealth: Payer: Self-pay | Admitting: Oncology

## 2018-01-17 ENCOUNTER — Inpatient Hospital Stay: Payer: Medicare Other

## 2018-01-17 VITALS — BP 128/61 | HR 58 | Temp 98.5°F | Resp 17 | Ht 63.0 in | Wt 201.7 lb

## 2018-01-17 DIAGNOSIS — Z79899 Other long term (current) drug therapy: Secondary | ICD-10-CM | POA: Diagnosis not present

## 2018-01-17 DIAGNOSIS — C50911 Malignant neoplasm of unspecified site of right female breast: Secondary | ICD-10-CM | POA: Diagnosis not present

## 2018-01-17 DIAGNOSIS — M810 Age-related osteoporosis without current pathological fracture: Secondary | ICD-10-CM

## 2018-01-17 DIAGNOSIS — Z79811 Long term (current) use of aromatase inhibitors: Secondary | ICD-10-CM

## 2018-01-17 DIAGNOSIS — Z7901 Long term (current) use of anticoagulants: Secondary | ICD-10-CM | POA: Diagnosis not present

## 2018-01-17 DIAGNOSIS — Z170 Estrogen receptor positive status [ER+]: Secondary | ICD-10-CM | POA: Diagnosis not present

## 2018-01-17 DIAGNOSIS — R0609 Other forms of dyspnea: Secondary | ICD-10-CM

## 2018-01-17 DIAGNOSIS — M25511 Pain in right shoulder: Secondary | ICD-10-CM

## 2018-01-17 LAB — CMP (CANCER CENTER ONLY)
ALBUMIN: 3.8 g/dL (ref 3.5–5.0)
ALT: 11 U/L (ref 0–44)
ANION GAP: 9 (ref 5–15)
AST: 13 U/L — ABNORMAL LOW (ref 15–41)
Alkaline Phosphatase: 119 U/L (ref 38–126)
BUN: 15 mg/dL (ref 8–23)
CHLORIDE: 101 mmol/L (ref 98–111)
CO2: 26 mmol/L (ref 22–32)
Calcium: 9.3 mg/dL (ref 8.9–10.3)
Creatinine: 1.01 mg/dL — ABNORMAL HIGH (ref 0.44–1.00)
GFR, Est AFR Am: >60 mL/min (ref >60)
GFR, Estimated: 53 mL/min — ABNORMAL LOW (ref >60)
GLUCOSE: 102 mg/dL — AB (ref 70–99)
POTASSIUM: 3.9 mmol/L (ref 3.5–5.1)
SODIUM: 136 mmol/L (ref 135–145)
TOTAL PROTEIN: 7 g/dL (ref 6.5–8.1)
Total Bilirubin: 0.6 mg/dL (ref 0.3–1.2)

## 2018-01-17 LAB — CBC WITH DIFFERENTIAL (CANCER CENTER ONLY)
ABS IMMATURE GRANULOCYTES: 0.04 10*3/uL (ref 0.00–0.07)
BASOS PCT: 0 %
Basophils Absolute: 0 10*3/uL (ref 0.0–0.1)
Eosinophils Absolute: 0.2 10*3/uL (ref 0.0–0.5)
Eosinophils Relative: 2 %
HCT: 39.9 % (ref 36.0–46.0)
HEMOGLOBIN: 13.8 g/dL (ref 12.0–15.0)
IMMATURE GRANULOCYTES: 0 %
LYMPHS PCT: 19 %
Lymphs Abs: 1.8 10*3/uL (ref 0.7–4.0)
MCH: 28.6 pg (ref 26.0–34.0)
MCHC: 34.6 g/dL (ref 30.0–36.0)
MCV: 82.6 fL (ref 80.0–100.0)
MONO ABS: 0.7 10*3/uL (ref 0.1–1.0)
Monocytes Relative: 8 %
NEUTROS ABS: 6.7 10*3/uL (ref 1.7–7.7)
NEUTROS PCT: 71 %
PLATELETS: 250 10*3/uL (ref 150–400)
RBC: 4.83 MIL/uL (ref 3.87–5.11)
RDW: 12.7 % (ref 11.5–15.5)
WBC Count: 9.5 10*3/uL (ref 4.0–10.5)
nRBC: 0 % (ref 0.0–0.2)

## 2018-01-17 NOTE — Telephone Encounter (Signed)
Printed calendar and avs. °

## 2018-01-17 NOTE — Progress Notes (Signed)
Hematology and Oncology Follow Up Visit  Gabriella Walker [id number] August 28, 1940 78 y.o. 01/17/2018 10:11 AM Gabriella Walker, MDShaw, Gabriella Saxon, MD   Principle Diagnosis: 78 year old woman with right breast cancer diagnosed in April 2015.  She was found to have T1cN0 invasive ductal carcinoma, grade II/III with ER PR positive HER-2 negative.    Prior Therapy:   She is S/P right lumpectomy on 05/29/2013 followed by radiation therapy under the care of Dr. Sondra Come completed in August of 2015.  Current therapy:  Arimidex 1 mg daily started in September 2015. This was switched to Aromasin in October 2016 for better tolerance related to arthralgias.  Interim History: Ms. Seat returns today for a follow-up.  Since last visit, she reports no major changes in her health.  She does report right shoulder pain that has been evaluated by orthopedic surgery recommended injection for pain control.  She does report some occasional dyspnea and has a sleep study scheduled in the future.  She continues to tolerate Aromasin without any complications.  She denies any worsening myalgias or arthralgias.  Her performance status and quality of life remains unchanged.  Her appetite is excellent.  She does not report any headaches or blurry vision or double vision.  She denies any dizziness or lethargy.  She does not report any fevers or chills or sweats or weight loss.  She does not report any chest pain shortness of breath.  She denies any nausea, vomiting or early satiety.  Does not report any changes in bowel habits. She does not report any frequency urgency or hesitancy.  She does not report any bleeding or clotting tendency.  Denies any mood changes.  The rest of her review of systems is negative.    Medications: I have reviewed the patient's current medications.  Current Outpatient Medications  Medication Sig Dispense Refill  . amLODipine (NORVASC) 10 MG tablet Take 1 tablet (10 mg total) by mouth daily. 90 tablet 3   . Calcium Carb-Cholecalciferol (CALCIUM + D3 PO) Take 2 tablets by mouth daily.    . cholecalciferol (VITAMIN D) 1000 UNITS tablet Take 1,000 Units by mouth daily.    Marland Kitchen ELIQUIS 5 MG TABS tablet TAKE 1 TABLET BY MOUTH TWICE A DAY 180 tablet 1  . exemestane (AROMASIN) 25 MG tablet TAKE 1 TABLET EVERY DAY AFTER BREAKFAST 90 tablet 1  . metoprolol (LOPRESSOR) 50 MG tablet Take 1 tablet by mouth 2 (two) times daily.    Marland Kitchen MYRBETRIQ 25 MG TB24 tablet Take 1 tablet by mouth daily.    . simvastatin (ZOCOR) 20 MG tablet Take 20 mg by mouth daily.    Marland Kitchen telmisartan-hydrochlorothiazide (MICARDIS HCT) 80-25 MG tablet Take 1 tablet by mouth daily.     No current facility-administered medications for this visit.      Allergies:  Allergies  Allergen Reactions  . Prilosec Otc [Omeprazole Magnesium] Itching    Past Medical History, Surgical history, Social history, and Family History reviewed today and discussed with her and updated.  Remained unchanged.Marland Kitchen  Physical Exam: Blood pressure 128/61, pulse (!) 58, temperature 98.5 F (36.9 C), temperature source Oral, resp. rate 17, height 5' 3" (1.6 m), weight 201 lb 11.2 oz (91.5 kg), SpO2 97 %.   ECOG: 0   General appearance: Comfortable appearing without any discomfort Head: Normocephalic without any trauma Oropharynx: Mucous membranes are moist and pink without any thrush or ulcers. Eyes: Pupils are equal and round reactive to light. Lymph nodes: No cervical, supraclavicular, inguinal or axillary lymphadenopathy.  Heart:regular rate and rhythm.  S1 and S2 without leg edema. Lung: Clear without any rhonchi or wheezes.  No dullness to percussion. Abdomin: Soft, nontender, nondistended with good bowel sounds.  No hepatosplenomegaly. Musculoskeletal: No joint deformity or effusion.  Full range of motion noted. Neurological: No deficits noted on motor, sensory and deep tendon reflex exam. Skin: No petechial rash or dryness.  Appeared moist.    Lab  Results: Lab Results  Component Value Date   WBC 9.5 01/17/2018   HGB 13.8 01/17/2018   HCT 39.9 01/17/2018   MCV 82.6 01/17/2018   PLT 250 01/17/2018     Chemistry      Component Value Date/Time   NA 137 01/18/2017 1112   NA 138 07/20/2016 1005   K 4.1 01/18/2017 1112   K 4.0 07/20/2016 1005   CL 100 01/18/2017 1112   CO2 27 01/18/2017 1112   CO2 26 07/20/2016 1005   BUN 19 01/18/2017 1112   BUN 15.0 07/20/2016 1005   CREATININE 1.00 01/18/2017 1112   CREATININE 1.0 07/20/2016 1005      Component Value Date/Time   CALCIUM 10.0 01/18/2017 1112   CALCIUM 10.0 07/20/2016 1005   ALKPHOS 127 01/18/2017 1112   ALKPHOS 131 07/20/2016 1005   AST 14 01/18/2017 1112   AST 17 07/20/2016 1005   ALT 12 01/18/2017 1112   ALT 14 07/20/2016 1005   BILITOT 0.6 01/18/2017 1112   BILITOT 0.56 07/20/2016 1005      I   Impression and Plan:   78 year old woman with:  1.  Right-sided breast cancer diagnosed in 2015.  She was found to have T1cN0 that is hormone receptor positive.   She continues to tolerate Aromasin without any major complications.  Risks and benefits of continuing this therapy to complete 5 years versus continuing beyond that was discussed today.  At this time she is agreeable to continue till September 2020 and she wants to discontinue therapy after that.  The risk of relapse was assessed today and felt would be reasonable to discontinue therapy after 5 years.  She continues to be up-to-date on mammograms with her next one scheduled for April 2020.   2. Osteoporosis: I have recommended continuing calcium and vitamin D supplement..  3.  Osteoarthritis of the shoulder: She has follow-up with orthopedics for possible pain management and injection.  4.  Dyspnea on exertion: Could be related to sleep apnea and sleep study scheduled in the near future.  5. Followup: Will be in August 2020.  15 minutes was spent with the patient face-to-face today.  More than 50% of  time was dedicated to reviewing her disease status, treatment options and answering question regarding plan of care.          Zola Button, MD 1/17/202010:11 AM

## 2018-01-21 ENCOUNTER — Telehealth: Payer: Self-pay | Admitting: *Deleted

## 2018-01-21 NOTE — Telephone Encounter (Signed)
-----   Message from Lawana Pai sent at 01/16/2018 10:53 AM EST ----- Regarding: sleep study Please precert and schedule. Thanks!

## 2018-01-21 NOTE — Telephone Encounter (Signed)
Patient notified of sleep study appointment. 

## 2018-01-30 ENCOUNTER — Encounter: Payer: Self-pay | Admitting: Gastroenterology

## 2018-01-30 ENCOUNTER — Ambulatory Visit (INDEPENDENT_AMBULATORY_CARE_PROVIDER_SITE_OTHER): Payer: Medicare Other | Admitting: Gastroenterology

## 2018-01-30 VITALS — BP 120/60 | HR 72 | Ht 63.0 in | Wt 202.2 lb

## 2018-01-30 DIAGNOSIS — Z86010 Personal history of colonic polyps: Secondary | ICD-10-CM

## 2018-01-30 DIAGNOSIS — Z7901 Long term (current) use of anticoagulants: Secondary | ICD-10-CM

## 2018-01-30 NOTE — Patient Instructions (Signed)
Call back in one month after your sleep study to schedule your Colonoscopy off Eliquis. We will send a letter at that time to your Cardiologist but if its been more then a couple of months we will have to see you back in the office again. When you call back ask to speak to [facility] and we will schedule your Colonoscopy.   Thank you for choosing me and [facility].  Pricilla Riffle. Dagoberto Ligas., MD., Marval Regal

## 2018-01-30 NOTE — Progress Notes (Addendum)
History of Present Illness: This is a 78 year old female self referred for the evaluation of a personal history of adenomatous colon polyps.  She has a history of right breast cancer and is followed by Dr. Alen Blew.  Her last colonoscopy was performed in December 2014 showing 4 sessile serrated adenomatous colon polyps, sigmoid colon diverticulosis and small internal hemorrhoids.  She is maintained on Eliquis for atrial fibrillation.  She relates problems with dyspnea on exertion and a lack of restorative sleep.  A sleep study is scheduled for February 25.  She recently was evaluated by her cardiologist, Dr. Stanford Breed, and her cardiac status was stable. Denies weight loss, abdominal pain, constipation, diarrhea, change in stool caliber, melena, hematochezia, nausea, vomiting, dysphagia, reflux symptoms, chest pain.     Allergies  Allergen Reactions  . Prilosec Otc [Omeprazole Magnesium] Itching   Outpatient Medications Prior to Visit  Medication Sig Dispense Refill  . amLODipine (NORVASC) 10 MG tablet Take 1 tablet (10 mg total) by mouth daily. 90 tablet 3  . Calcium Carb-Cholecalciferol (CALCIUM + D3 PO) Take 2 tablets by mouth daily.    . cholecalciferol (VITAMIN D) 1000 UNITS tablet Take 1,000 Units by mouth daily.    Marland Kitchen ELIQUIS 5 MG TABS tablet TAKE 1 TABLET BY MOUTH TWICE A DAY 180 tablet 1  . exemestane (AROMASIN) 25 MG tablet TAKE 1 TABLET EVERY DAY AFTER BREAKFAST 90 tablet 1  . metoprolol (LOPRESSOR) 50 MG tablet Take 1 tablet by mouth 2 (two) times daily.    Marland Kitchen MYRBETRIQ 25 MG TB24 tablet Take 1 tablet by mouth daily.    . simvastatin (ZOCOR) 20 MG tablet Take 20 mg by mouth daily.    Marland Kitchen telmisartan-hydrochlorothiazide (MICARDIS HCT) 80-25 MG tablet Take 1 tablet by mouth daily.     No facility-administered medications prior to visit.    Past Medical History:  Diagnosis Date  . Adenomatous colon polyp   . Atrial fibrillation ([place])   . Breast cancer ([place]) 2015   Right Breast  Cancer  . GERD (gastroesophageal reflux disease)   . HTN (hypertension)   . Hyperlipidemia   . Radiation 08/03/13-08/24/13   right breast 42.72 gray  . Wears glasses    Past Surgical History:  Procedure Laterality Date  . APPENDECTOMY  1956  . BREAST BIOPSY Right 04/22/2013  . BREAST LUMPECTOMY Right 05/29/2013  . BREAST LUMPECTOMY WITH NEEDLE LOCALIZATION AND AXILLARY SENTINEL LYMPH NODE BX Right 05/29/2013   Procedure: BREAST LUMPECTOMY WITH NEEDLE LOCALIZATION AND AXILLARY SENTINEL LYMPH NODE BX;  Surgeon: Edward Jolly, MD;  Location: [facility];  Service: General;  Laterality: Right;  . CARDIAC CATHETERIZATION  2008  . [place]  . KNEE ARTHROSCOPY  2001   right  . RE-EXCISION OF BREAST LUMPECTOMY Right 06/12/2013   Procedure: RE-EXCISION OF BREAST LUMPECTOMY;  Surgeon: Edward Jolly, MD;  Location: [facility];  Service: General;  Laterality: Right;  . TUBAL LIGATION  1976  . UPPER GI ENDOSCOPY     Social History   Socioeconomic History  . Marital status: Widowed    Spouse name: Not on file  . Number of children: 2  . Years of education: Not on file  . Highest education level: Not on file  Occupational History  . Occupation: retired  Scientific laboratory technician  . Financial resource strain: Not on file  . Food insecurity:    Worry: Not on file    Inability: Not on  file  . Transportation needs:    Medical: Not on file    Non-medical: Not on file  Tobacco Use  . Smoking status: Never Smoker  . Smokeless tobacco: Never Used  Substance and Sexual Activity  . Alcohol use: No  . Drug use: No  . Sexual activity: Not on file  Lifestyle  . Physical activity:    Days per week: Not on file    Minutes per session: Not on file  . Stress: Not on file  Relationships  . Social connections:    Talks on phone: Not on file    Gets together: Not on file    Attends religious service: Not on file    Active member of club or  organization: Not on file    Attends meetings of clubs or organizations: Not on file    Relationship status: Not on file  Other Topics Concern  . Not on file  Social History Narrative  . Not on file   Family History  Problem Relation Age of Onset  . Coronary artery disease Father        MI at age 6  . Heart disease Father   . Heart disease Mother   . Hypertension Mother   . Cancer Cousin 47       had lumpectomy and radiation,  25 years ago  . Colon cancer Neg Hx   . Esophageal cancer Neg Hx   . Rectal cancer Neg Hx   . Stomach cancer Neg Hx       Review of Systems: Pertinent positive and negative review of systems were noted in the above HPI section. All other review of systems were otherwise negative.   Physical Exam: General: Well developed, well nourished, no acute distress Head: Normocephalic and atraumatic Eyes:  sclerae anicteric, EOMI Ears: Normal auditory acuity Mouth: No deformity or lesions Neck: Supple, no masses or thyromegaly Lungs: Clear throughout to auscultation Heart: Regular rate and rhythm; no murmurs, rubs or bruits Abdomen: Soft, non tender and non distended. No masses, hepatosplenomegaly or hernias noted. Normal Bowel sounds Rectal: Deferred to colonoscopy Musculoskeletal: Symmetrical with no gross deformities  Skin: No lesions on visible extremities Pulses:  Normal pulses noted Extremities: No clubbing, cyanosis, edema or deformities noted Neurological: Alert oriented x 4, grossly nonfocal Cervical Nodes:  No significant cervical adenopathy Inguinal Nodes: No significant inguinal adenopathy Psychological:  Alert and cooperative. Normal mood and affect   Assessment and Recommendations:  1.  Personal history of sessile serrated adenomatous colon polyps.  I recommended she proceed with colonoscopy.  She is fit for colonoscopy however she would like to complete her sleep study and follow-up with Dr. Brigitte Pulse for further evaluation of her dyspnea and  lack of restorative sleep prior to scheduling colonoscopy.  The risks (including bleeding, perforation, infection, missed lesions, medication reactions and possible hospitalization or surgery if complications occur), benefits, and alternatives to colonoscopy with possible biopsy and possible polypectomy were discussed with the patient and they consent to proceed. She will contact us after her sleep study to schedule colonoscopy.  2.  Atrial fibrillation maintained on Eliquis. Hold Eliquis 2 days before procedure - will instruct when and how to resume after procedure. Low but real risk of cardiovascular event such as heart attack, stroke, embolism, thrombosis or ischemia/infarct of other organs off Eliquis explained and need to seek urgent help if this occurs. The patient consents to proceed. Will communicate by phone or EMR with patient's prescribing provider to confirm that holding  Eliquis is reasonable in this case.   3. History of right breast cancer.  4. DOE and lack of restorative sleep. Further evaluation with Dr. Brigitte Pulse.  See #1.

## 2018-02-12 DIAGNOSIS — M25511 Pain in right shoulder: Secondary | ICD-10-CM | POA: Diagnosis not present

## 2018-02-13 ENCOUNTER — Encounter (INDEPENDENT_AMBULATORY_CARE_PROVIDER_SITE_OTHER): Payer: Medicare Other

## 2018-02-25 ENCOUNTER — Ambulatory Visit (INDEPENDENT_AMBULATORY_CARE_PROVIDER_SITE_OTHER): Payer: Medicare Other | Admitting: Family Medicine

## 2018-02-25 ENCOUNTER — Encounter (INDEPENDENT_AMBULATORY_CARE_PROVIDER_SITE_OTHER): Payer: Self-pay | Admitting: Family Medicine

## 2018-02-25 ENCOUNTER — Ambulatory Visit (HOSPITAL_BASED_OUTPATIENT_CLINIC_OR_DEPARTMENT_OTHER): Payer: Medicare Other | Attending: Cardiology | Admitting: Cardiovascular Disease

## 2018-02-25 VITALS — Ht 62.0 in | Wt 193.0 lb

## 2018-02-25 VITALS — BP 131/68 | HR 55 | Temp 97.6°F | Ht 62.0 in | Wt 193.0 lb

## 2018-02-25 DIAGNOSIS — Z1331 Encounter for screening for depression: Secondary | ICD-10-CM | POA: Diagnosis not present

## 2018-02-25 DIAGNOSIS — E7849 Other hyperlipidemia: Secondary | ICD-10-CM | POA: Diagnosis not present

## 2018-02-25 DIAGNOSIS — Z6835 Body mass index (BMI) 35.0-35.9, adult: Secondary | ICD-10-CM | POA: Diagnosis not present

## 2018-02-25 DIAGNOSIS — E559 Vitamin D deficiency, unspecified: Secondary | ICD-10-CM

## 2018-02-25 DIAGNOSIS — R0602 Shortness of breath: Secondary | ICD-10-CM | POA: Diagnosis not present

## 2018-02-25 DIAGNOSIS — R0683 Snoring: Secondary | ICD-10-CM | POA: Diagnosis not present

## 2018-02-25 DIAGNOSIS — R5383 Other fatigue: Secondary | ICD-10-CM | POA: Diagnosis not present

## 2018-02-25 DIAGNOSIS — R739 Hyperglycemia, unspecified: Secondary | ICD-10-CM

## 2018-02-25 DIAGNOSIS — Z0289 Encounter for other administrative examinations: Secondary | ICD-10-CM

## 2018-02-25 NOTE — Progress Notes (Signed)
.  Office: [phone]  /  Fax: [phone]   HPI:   Chief Complaint: OBESITY  Gabriella Walker (MR# [id number]) is a 78 y.o. adult who presents on Walker for obesity evaluation and treatment. Current BMI is Body mass index is 35.3 kg/m. Gabriella Walker has struggled with obesity for years and has been unsuccessful in either losing weight or maintaining long term weight loss. Gabriella Walker heard about our clinic from a friend, attended our information session and states she is currently in the action stage of change and ready to dedicate time achieving and maintaining a healthier weight.  Gabriella Walker states she struggles with family and or coworkers weight loss sabotage her desired weight loss is 53 lbs she started gaining weight about 8 years ago her heaviest weight ever was 200 lbs. she is a picky eater and doesn't like to eat healthier foods  she skips breakfast frequently she frequently makes poor food choices she frequently eats larger portions than normal  she has binge eating behaviors she struggles with emotional eating    Fatigue Gabriella Walker feels her energy is lower than it should be. This has worsened with weight gain and has worsened recently. Gabriella Walker admits to daytime somnolence and  admits to waking up still tired. Patient is at risk for obstructive sleep apnea. Patent has a history of symptoms of daytime fatigue and morning fatigue. Patient generally gets 7 or 8 hours of sleep per night, and states they generally does not sleep well most nights. She is not sure if snoring is present as she lives alone. Apneic episodes are not present. Epworth Sleepiness Score is 8. Gabriella Walker had an EKG done on 01/16/2018 at her cardiologist's office which showed NSR with PAC's.  Dyspnea on exertion Gabriella Walker notes increasing shortness of breath with exercising and seems to be worsening over time with weight gain. She notes getting out of breath sooner with activity than she used to. This has gotten worse recently. Gabriella Walker denies  orthopnea.  Hyperglycemia Analiza has a history of some elevated blood glucose readings without a diagnosis of diabetes. Her last labs showed an elevated glucose. She does report positive carb cravings.   Vitamin D deficiency Gabriella Walker has a diagnosis of Vitamin D deficiency. She is currently taking an OTC Vit D supplement and does report positive fatigue.   Hyperlipidemia Gabriella Walker has hyperlipidemia and has been trying to improve her cholesterol levels with intensive lifestyle modification including a low saturated fat diet, exercise and weight loss. Gabriella Walker is on a statin and does not report any side effects related to this.   Depression Screen Gabriella Walker's Food and Mood (modified PHQ-9) score was 17.  Depression screen Gabriella Walker, Gabriella Walker  Decreased Interest 3  Down, Depressed, Hopeless 3  PHQ - 2 Score 6  Altered sleeping 1  Tired, decreased energy 3  Change in appetite 2  Feeling bad or failure about yourself  1  Trouble concentrating 1  Moving slowly or fidgety/restless 3  Suicidal thoughts 0  PHQ-9 Score 17  Difficult doing work/chores Extremely dIfficult   ASSESSMENT AND PLAN:  Other fatigue - Plan: T3, T4, free, TSH  Shortness of breath on exertion  Hyperglycemia - Plan: Comprehensive metabolic panel, Hemoglobin A1c, Insulin, random  Vitamin D deficiency - Plan: VITAMIN D 25 Hydroxy (Vit-D Deficiency, Fractures)  Other hyperlipidemia - Plan: Lipid Panel With LDL/HDL Ratio  Depression screening  Class 2 severe obesity with serious comorbidity and body mass index (BMI) of 35.0 to 35.9 in adult, unspecified obesity type (Gabriella Walker)  PLAN:  Fatigue Rosslyn was informed that his fatigue may be related to obesity, depression or many other causes. Labs will be ordered, and in the meanwhile Julieta has agreed to work on diet, exercise and weight loss to help with fatigue. Proper sleep hygiene was discussed including the need for 7-8 hours of quality sleep each night. A sleep study was not ordered  based on symptoms and Epworth score. We will obtain IC and labs today.  Dyspnea on exertion Gabriella Walker's shortness of breath appears to be obesity related and exercise induced. She has agreed to work on weight loss and gradually increase exercise to treat her exercise induced shortness of breath. If Xaviera follows our instructions and loses weight without improvement of his shortness of breath, we will plan to refer to pulmonology. We will monitor this condition regularly. Yamari agrees to this plan.  Hyperglycemia Fasting labs will be obtained and results with be discussed with Gabriella Walker in 2 weeks at her follow up visit. In the meanwhile Elaine was started on a lower simple carbohydrate diet and will work on weight loss efforts. We will obtain Hgb A1C and insulin level today.  Vitamin D Deficiency Gabriella Walker was informed that low Vitamin D levels contributes to fatigue and are associated with obesity, breast, and colon cancer. She will have testing of Vitamin D level today. She was informed of the risk of over-replacement of Vitamin D and agrees to not increase her dose unless she discusses this with [place] first. Gabriella Walker agrees to follow-up with our clinic in 2 weeks.  Hyperlipidemia Gabriella Walker was informed of the American Heart Association Guidelines emphasizing intensive lifestyle modifications as the first line treatment for hyperlipidemia. We discussed many lifestyle modifications today in depth, and Gabriella Walker will continue to work on decreasing saturated fats such as fatty red meat, butter and many fried foods. She will also increase vegetables and lean protein in her diet and continue to work on exercise and weight loss efforts. Gabriella Walker will have fasting lipid panel drawn today.  Depression Screen Suellyn had a strongly positive depression screening. Depression is commonly associated with obesity and often results in emotional eating behaviors. We will monitor this closely and work on CBT to help improve the non-hunger eating  patterns. Referral to Psychology may be required if no improvement is seen as she continues in our clinic.  Obesity Jazman is currently in the action stage of change and her goal is to continue with weight loss efforts. She has agreed to follow the Category 2 plan. Kearie has been instructed to work up to a goal of 150 minutes of combined cardio and strengthening exercise per week for weight loss and overall health benefits. We discussed the following Behavioral Modification Strategies today: increasing lean protein intake, increasing vegetables, work on meal planning and easy cooking plans, and planning for success.  Jaeley has agreed to follow-up with our clinic in 2 weeks. She was informed of the importance of frequent follow up visits to maximize her success with intensive lifestyle modifications for her multiple health conditions. She was informed we would discuss her lab results at her next visit unless there is a critical issue that needs to be addressed sooner. Nhu agreed to keep her next visit at the agreed upon time to discuss these results.  ALLERGIES: Allergies  Allergen Reactions  . Prilosec Otc [Omeprazole Magnesium] Itching    MEDICATIONS: Current Outpatient Medications on File Prior to Visit  Medication Sig Dispense Refill  . Calcium Carb-Cholecalciferol (CALCIUM + D3 PO)  Take 2 tablets by mouth daily.    . cholecalciferol (VITAMIN D) 1000 UNITS tablet Take 1,000 Units by mouth daily.    Marland Kitchen ELIQUIS 5 MG TABS tablet TAKE 1 TABLET BY MOUTH TWICE A DAY 180 tablet 1  . exemestane (AROMASIN) 25 MG tablet TAKE 1 TABLET EVERY DAY AFTER BREAKFAST 90 tablet 1  . metoprolol (LOPRESSOR) 50 MG tablet Take 1 tablet by mouth 2 (two) times daily.    Marland Kitchen MYRBETRIQ 25 MG TB24 tablet Take 1 tablet by mouth daily.    . simvastatin (ZOCOR) 20 MG tablet Take 20 mg by mouth daily.    Marland Kitchen telmisartan-hydrochlorothiazide (MICARDIS HCT) 80-25 MG tablet Take 1 tablet by mouth daily.     No current  facility-administered medications on file prior to visit.     PAST MEDICAL HISTORY: Past Medical History:  Diagnosis Date  . Adenomatous colon polyp   . Atrial fibrillation ([place])   . Back pain   . Bladder leak   . Breast cancer ([place]) 2015   Right Breast Cancer  . GERD (gastroesophageal reflux disease)   . HTN (hypertension)   . Hyperlipidemia   . Joint pain   . Palpitation   . Radiation 08/03/13-08/24/13   right breast 42.72 gray  . SOB (shortness of breath)   . Swelling   . Wears glasses     PAST SURGICAL HISTORY: Past Surgical History:  Procedure Laterality Date  . APPENDECTOMY  1956  . BREAST BIOPSY Right 04/22/2013  . BREAST LUMPECTOMY Right 05/29/2013  . BREAST LUMPECTOMY WITH NEEDLE LOCALIZATION AND AXILLARY SENTINEL LYMPH NODE BX Right 05/29/2013   Procedure: BREAST LUMPECTOMY WITH NEEDLE LOCALIZATION AND AXILLARY SENTINEL LYMPH NODE BX;  Surgeon: Edward Jolly, MD;  Location: [facility];  Service: General;  Laterality: Right;  . CARDIAC CATHETERIZATION  2008  . [place]  . KNEE ARTHROSCOPY  2001   right  . RE-EXCISION OF BREAST LUMPECTOMY Right 06/12/2013   Procedure: RE-EXCISION OF BREAST LUMPECTOMY;  Surgeon: Edward Jolly, MD;  Location: [facility];  Service: General;  Laterality: Right;  . TUBAL LIGATION  1976  . UPPER GI ENDOSCOPY      SOCIAL HISTORY: Social History   Tobacco Use  . Smoking status: Never Smoker  . Smokeless tobacco: Never Used  Substance Use Topics  . Alcohol use: No  . Drug use: No    FAMILY HISTORY: Family History  Problem Relation Age of Onset  . Coronary artery disease Father        MI at age 40  . Heart disease Father   . Heart disease Mother   . Hypertension Mother   . Cancer Cousin 47       had lumpectomy and radiation,  25 years ago  . Colon cancer Neg Hx   . Esophageal cancer Neg Hx   . Rectal cancer Neg Hx   . Stomach cancer Neg Hx    ROS: Review of  Systems  Constitutional: Positive for malaise/fatigue.  Eyes:       Positive for wearing glasses or contact.  Respiratory: Positive for shortness of breath (with activity.).   Cardiovascular: Negative for orthopnea.  Gastrointestinal: Positive for heartburn.  Musculoskeletal: Positive for back pain.       Positive for muscle stiffness. Positive for calf/leg pain with walking.  Skin: Positive for itching.  Neurological: Positive for weakness.   PHYSICAL EXAM: Blood pressure 131/68, pulse (!) 55, temperature 97.6 F (36.4  C), temperature source Oral, height 5\' 2"  (1.575 m), weight 193 lb (87.5 kg), SpO2 99 %. Body mass index is 35.3 kg/m. Physical Exam Vitals signs reviewed.  Constitutional:      Appearance: Normal appearance. He is well-developed. He is obese.  HENT:     Head: Normocephalic and atraumatic.     Nose: Nose normal.  Eyes:     General: No scleral icterus. Neck:     Musculoskeletal: Normal range of motion.  Cardiovascular:     Rate and Rhythm: Normal rate and regular rhythm.  Pulmonary:     Effort: Pulmonary effort is normal. No respiratory distress.  Abdominal:     Palpations: Abdomen is soft.     Tenderness: There is no abdominal tenderness.  Musculoskeletal: Normal range of motion.     Comments: Range of motion normal in all four extremities.  Skin:    General: Skin is warm and dry.  Neurological:     Mental Status: He is alert and oriented to person, place, and time.     Coordination: Coordination normal.  Psychiatric:        Mood and Affect: Mood and affect normal.        Behavior: Behavior normal.   RECENT LABS AND TESTS: BMET    Component Value Date/Time   NA 136 01/17/2018 0945   NA 138 07/20/2016 1005   K 3.9 01/17/2018 0945   K 4.0 07/20/2016 1005   CL 101 01/17/2018 0945   CO2 26 01/17/2018 0945   CO2 26 07/20/2016 1005   GLUCOSE 102 (H) 01/17/2018 0945   GLUCOSE 108 07/20/2016 1005   BUN 15 01/17/2018 0945   BUN 15.0 07/20/2016 1005     CREATININE 1.01 (H) 01/17/2018 0945   CREATININE 1.0 07/20/2016 1005   CALCIUM 9.3 01/17/2018 0945   CALCIUM 10.0 07/20/2016 1005   GFRNONAA 53 (L) 01/17/2018 0945   GFRAA >60 01/17/2018 0945   No results found for: HGBA1C No results found for: INSULIN CBC    Component Value Date/Time   WBC 9.5 01/17/2018 0945   WBC 8.4 01/18/2017 1112   RBC 4.83 01/17/2018 0945   HGB 13.8 01/17/2018 0945   HGB 14.2 07/20/2016 1005   HCT 39.9 01/17/2018 0945   HCT 41.6 07/20/2016 1005   PLT 250 01/17/2018 0945   PLT 207 07/20/2016 1005   MCV 82.6 01/17/2018 0945   MCV 83.7 07/20/2016 1005   MCH 28.6 01/17/2018 0945   MCHC 34.6 01/17/2018 0945   RDW 12.7 01/17/2018 0945   RDW 13.4 07/20/2016 1005   LYMPHSABS 1.8 01/17/2018 0945   LYMPHSABS 2.6 07/20/2016 1005   MONOABS 0.7 01/17/2018 0945   MONOABS 0.7 07/20/2016 1005   EOSABS 0.2 01/17/2018 0945   EOSABS 0.3 07/20/2016 1005   BASOSABS 0.0 01/17/2018 0945   BASOSABS 0.1 07/20/2016 1005   Iron/TIBC/Ferritin/ %Sat No results found for: IRON, TIBC, FERRITIN, IRONPCTSAT Lipid Panel  No results found for: CHOL, TRIG, HDL, CHOLHDL, VLDL, LDLCALC, LDLDIRECT Hepatic Function Panel     Component Value Date/Time   PROT 7.0 01/17/2018 0945   PROT 7.0 07/20/2016 1005   ALBUMIN 3.8 01/17/2018 0945   ALBUMIN 3.7 07/20/2016 1005   AST 13 (L) 01/17/2018 0945   AST 17 07/20/2016 1005   ALT 11 01/17/2018 0945   ALT 14 07/20/2016 1005   ALKPHOS 119 01/17/2018 0945   ALKPHOS 131 07/20/2016 1005   BILITOT 0.6 01/17/2018 0945   BILITOT 0.56 07/20/2016 1005  Component Value Date/Time   TSH 0.48 02/23/2016 1009   ECG was done on 01/16/2018 at her cardiologist's office and showed NSR with PAC's with a rate of 64 BPM.  INDIRECT CALORIMETER done today shows a VO2 of 193 and a REE of 1342. Her calculated basal metabolic rate is 2423 thus her basal metabolic rate is worse than expected.  OBESITY BEHAVIORAL INTERVENTION VISIT  Today's visit  was #1  Starting weight: 193 lbs Starting date: 02/25/2018 Today's weight: 193 lbs Today's date: Walker Total lbs lost to date: 0 At least 15 minutes were spent on discussing the following behavioral intervention visit.    Walker  Height 5\' 2"  (1.575 m)  Weight 193 lb (87.5 kg)  BMI (Calculated) 35.29  BLOOD PRESSURE - SYSTOLIC 536  BLOOD PRESSURE - DIASTOLIC 68   Body Fat % 14.4 %  Total Body Water (lbs) 77.6 lbs  RMR 1342   ASK: We discussed the diagnosis of obesity with Charletta Cousin today and Reegan agreed to give [place] permission to discuss obesity behavioral modification therapy today.  ASSESS: Amere has the diagnosis of obesity and his BMI today is 35.29. Brietta is in the action stage of change.   ADVISE: Marabella was educated on the multiple health risks of obesity as well as the benefit of weight loss to improve her health. She was advised of the need for long term treatment and the importance of lifestyle modifications to improve her current health and to decrease her risk of future health problems.  AGREE: Multiple dietary modification options and treatment options were discussed and  Aamani agreed to follow the recommendations documented in the above note.  ARRANGE: Karia was educated on the importance of frequent visits to treat obesity as outlined per CMS and USPSTF guidelines and agreed to schedule her next follow up appointment today.   I, Michaelene Song, am acting as transcriptionist for Ilene Qua, MD   I have reviewed the above documentation for accuracy and completeness, and I agree with the above. - Ilene Qua, MD

## 2018-02-26 LAB — COMPREHENSIVE METABOLIC PANEL
ALT: 16 IU/L (ref 0–32)
AST: 15 IU/L (ref 0–40)
Albumin/Globulin Ratio: 1.6 (ref 1.2–2.2)
Albumin: 4.1 g/dL (ref 3.7–4.7)
Alkaline Phosphatase: 119 IU/L — ABNORMAL HIGH (ref 39–117)
BILIRUBIN TOTAL: 0.8 mg/dL (ref 0.0–1.2)
BUN/Creatinine Ratio: 23 (ref 12–28)
BUN: 21 mg/dL (ref 8–27)
CHLORIDE: 95 mmol/L — AB (ref 96–106)
CO2: 24 mmol/L (ref 20–29)
Calcium: 9.5 mg/dL (ref 8.7–10.3)
Creatinine, Ser: 0.93 mg/dL (ref 0.57–1.00)
GFR calc non Af Amer: 59 mL/min/{1.73_m2} — ABNORMAL LOW (ref >59)
GFR, EST AFRICAN AMERICAN: 68 mL/min/{1.73_m2} (ref >59)
GLUCOSE: 94 mg/dL (ref 65–99)
Globulin, Total: 2.5 g/dL (ref 1.5–4.5)
Potassium: 4.6 mmol/L (ref 3.5–5.2)
Sodium: 134 mmol/L (ref 134–144)
TOTAL PROTEIN: 6.6 g/dL (ref 6.0–8.5)

## 2018-02-26 LAB — HEMOGLOBIN A1C
Est. average glucose Bld gHb Est-mCnc: 126 mg/dL
HEMOGLOBIN A1C: 6 % — AB (ref 4.8–5.6)

## 2018-02-26 LAB — T4, FREE: FREE T4: 1.43 ng/dL (ref 0.82–1.77)

## 2018-02-26 LAB — LIPID PANEL WITH LDL/HDL RATIO
CHOLESTEROL TOTAL: 140 mg/dL (ref 100–199)
HDL: 48 mg/dL (ref >39)
LDL Calculated: 78 mg/dL (ref 0–99)
LDl/HDL Ratio: 1.6 ratio (ref 0.0–3.2)
Triglycerides: 69 mg/dL (ref 0–149)
VLDL CHOLESTEROL CAL: 14 mg/dL (ref 5–40)

## 2018-02-26 LAB — SPECIMEN STATUS REPORT

## 2018-02-26 LAB — VITAMIN D 25 HYDROXY (VIT D DEFICIENCY, FRACTURES): Vit D, 25-Hydroxy: 39.5 ng/mL (ref 30.0–100.0)

## 2018-02-26 LAB — INSULIN, RANDOM: INSULIN: 9.7 u[IU]/mL (ref 2.6–24.9)

## 2018-02-26 LAB — T3: T3, Total: 104 ng/dL (ref 71–180)

## 2018-02-26 LAB — TSH: TSH: 0.555 u[IU]/mL (ref 0.450–4.500)

## 2018-03-01 ENCOUNTER — Encounter (HOSPITAL_BASED_OUTPATIENT_CLINIC_OR_DEPARTMENT_OTHER): Payer: Self-pay | Admitting: Cardiovascular Disease

## 2018-03-01 NOTE — Procedures (Signed)
Patient Name: Gabriella Walker, Gabriella Walker Date: 02/25/2018 Gender: Female D.O.B: 05-27-1940 Age (years): 78 Referring Provider: Kirk Ruths Height (inches): 29 Interpreting Physician: Shelva Majestic MD, ABSM Weight (lbs): 193 RPSGT: Carolin Coy BMI: 35 MRN: [id number] Neck Size: 15.00  CLINICAL INFORMATION Sleep Study Type: Split Night CPAP  Indication for sleep study: Excessive Daytime Sleepiness, Fatigue, Hypertension, Obesity, Snoring  Epworth Sleepiness Score: 12  SLEEP STUDY TECHNIQUE As per the AASM Manual for the Scoring of Sleep and Associated Events v2.3 (April 2016) with a hypopnea requiring 4% desaturations.  The channels recorded and monitored were frontal, central and occipital EEG, electrooculogram (EOG), submentalis EMG (chin), nasal and oral airflow, thoracic and abdominal wall motion, anterior tibialis EMG, snore microphone, electrocardiogram, and pulse oximetry. Continuous positive airway pressure (CPAP) was initiated when the patient met split night criteria and was titrated according to treat sleep-disordered breathing.  MEDICATIONS     Calcium Carb-Cholecalciferol (CALCIUM + D3 PO)         cholecalciferol (VITAMIN D) 1000 UNITS tablet         ELIQUIS 5 MG TABS tablet         exemestane (AROMASIN) 25 MG tablet         metoprolol (LOPRESSOR) 50 MG tablet         MYRBETRIQ 25 MG TB24 tablet         simvastatin (ZOCOR) 20 MG tablet         telmisartan-hydrochlorothiazide (MICARDIS HCT) 80-25 MG tablet      Medications self-administered by patient taken the night of the study : N/A  RESPIRATORY PARAMETERS Diagnostic Total AHI (/hr): 23.9 RDI (/hr): 36.7 OA Index (/hr): - CA Index (/hr): 0.0 REM AHI (/hr): 60.0 NREM AHI (/hr): 17.8 Supine AHI (/hr): 22.0 Non-supine AHI (/hr): 24.1 Min O2 Sat (%): 85.0 Mean O2 (%): 90.2 Time below 88% (min): 34.2   Titration  Pressure (cm): 5 AHI at Optimal Pressure (/hr): 0.0 Min O2 at Optimal Pressure  (%): 89.0 Supine % at Optimal (%): 0 Sleep % at Optimal (%): 1   SLEEP ARCHITECTURE The recording time for the entire night was 410.9 minutes.  During a baseline period of 259.7 minutes, the patient slept for 125.8 minutes in REM and nonREM, yielding a sleep efficiency of 48.4%%. Sleep onset after lights out was 38.4 minutes with a REM latency of 171.5 minutes. The patient spent 20.3%% of the night in stage N1 sleep, 65.4%% in stage N2 sleep, 0.0%% in stage N3 and 14.3% in REM.  During the titration period of 140.6 minutes, the patient slept for 1.5 minutes in REM and nonREM, yielding a sleep efficiency of 1.1%%. Sleep onset after CPAP initiation was 131.6 minutes with a REM latency of N/A minutes. The patient spent 33.3%% of the night in stage N1 sleep, 66.7%% in stage N2 sleep, 0.0%% in stage N3 and 0% in REM.   CARDIAC DATA The 2 lead EKG demonstrated sinus rhythm. The mean heart rate was 100.0 beats per minute. Other EKG findings include: None.  LEG MOVEMENT DATA The total Periodic Limb Movements of Sleep (PLMS) were 0. The PLMS index was 0.0 .  IMPRESSIONS - Moderate obstructive sleep apnea overall during the diagnostic portion of the study (AHI 23.9/h; RDI 36.7/h); however, sleep apnea was severe during REM sleep (AHI 60/h).  CPAP was initiated at 5 cm but due to inadequate sleep at only 8.9 minutes (sleep efficiency 1.1%) adequate titration was not able to be performed.  - No significant  central sleep apnea occurred during the diagnostic portion of the study (CAI = 0.0/hour). - Oxygen desaturation during the diagnostic portion of the study to a nadir of 86% with NREM and 85% during REM sleep. - The patient snored with moderate snoring volume during the diagnostic portion of the study. - Occasional isolated PACs were present throughout the study. - Clinically significant periodic limb movements did not occur during sleep.  DIAGNOSIS - Obstructive Sleep Apnea (327.23 [G47.33  ICD-10])  RECOMMENDATIONS - Recommend a full night CPAP titraton study with a sleep aid the evening of the study to allow for adequate sleep duration and titration. - Efforts should be made to optimize nasal and oropharyngeal patency.  - Avoid alcohol, sedatives and other CNS depressants that may worsen sleep apnea and disrupt normal sleep architecture. - Sleep hygiene should be reviewed to assess factors that may improve sleep quality. - Weight management and regular exercise should be initiated or continued. - Recommend a sleep clinic evaluation following CPAP titration and after 4 weeks of therapy.  [Electronically signed] 03/01/2018 09:26 AM  Shelva Majestic MD, [facility], [place], American Board of Sleep Medicine   NPI: [id number] [place] PH: [id number]   FX: [phone] [place]

## 2018-03-04 ENCOUNTER — Other Ambulatory Visit: Payer: Self-pay | Admitting: Cardiovascular Disease

## 2018-03-04 ENCOUNTER — Telehealth: Payer: Self-pay | Admitting: *Deleted

## 2018-03-04 DIAGNOSIS — R002 Palpitations: Secondary | ICD-10-CM

## 2018-03-04 DIAGNOSIS — G4733 Obstructive sleep apnea (adult) (pediatric): Secondary | ICD-10-CM

## 2018-03-04 DIAGNOSIS — I10 Essential (primary) hypertension: Secondary | ICD-10-CM

## 2018-03-04 MED ORDER — ZOLPIDEM TARTRATE 5 MG PO TABS: ORAL_TABLET | ORAL | 0 refills | Status: DC

## 2018-03-04 NOTE — Telephone Encounter (Signed)
Patient notified of sleep study results and recommendations. She agrees to return to the lab for CPAP titration scheduled on 04/22/18. Zolpidem tart 5 mg phoned in to [place] per VO fromDr Claiborne Billings  to take upon arriving at the sleep lab.for the study.

## 2018-03-04 NOTE — Telephone Encounter (Signed)
-----   Message from Troy Sine, MD sent at 03/01/2018  9:33 AM EST ----- Mariann Laster, please notify of results. Although this was a split not study, inadequqte CPAP titration; schedule for full night CPAP titration study with sleep aid the night of the titration.

## 2018-03-11 ENCOUNTER — Encounter (INDEPENDENT_AMBULATORY_CARE_PROVIDER_SITE_OTHER): Payer: Self-pay | Admitting: Family Medicine

## 2018-03-11 ENCOUNTER — Ambulatory Visit (INDEPENDENT_AMBULATORY_CARE_PROVIDER_SITE_OTHER): Payer: Medicare Other | Admitting: Family Medicine

## 2018-03-11 VITALS — BP 115/69 | HR 71 | Temp 97.9°F | Ht 62.0 in | Wt 190.0 lb

## 2018-03-11 DIAGNOSIS — R7303 Prediabetes: Secondary | ICD-10-CM | POA: Diagnosis not present

## 2018-03-11 DIAGNOSIS — E559 Vitamin D deficiency, unspecified: Secondary | ICD-10-CM | POA: Diagnosis not present

## 2018-03-11 DIAGNOSIS — Z6834 Body mass index (BMI) 34.0-34.9, adult: Secondary | ICD-10-CM | POA: Diagnosis not present

## 2018-03-11 DIAGNOSIS — I10 Essential (primary) hypertension: Secondary | ICD-10-CM | POA: Diagnosis not present

## 2018-03-11 DIAGNOSIS — E669 Obesity, unspecified: Secondary | ICD-10-CM | POA: Diagnosis not present

## 2018-03-11 MED ORDER — VITAMIN D (ERGOCALCIFEROL) 1.25 MG (50000 UNIT) PO CAPS: 50000.0000 [IU] | ORAL_CAPSULE | ORAL | 0 refills | Status: DC

## 2018-03-11 NOTE — Progress Notes (Signed)
Office: [phone]  /  Fax: [phone]   HPI:   Chief Complaint: OBESITY Gabriella Walker is here to discuss her progress with her obesity treatment plan. She is on the Category 2 plan and is following her eating plan approximately 90 % of the time. She states her is exercising 0 minutes 0 times per week. Gabriella Walker liked the plan, she felt she got enough to eat. All of the foods she like. She is getting in 6 oz of protein at dinner. She did get in a full 2 cups of vegetables at dinner. She was craving ice cream and did [place] bars. She notes being hungry only 1 day. Her weight is 190 lb (86.2 kg) today and has had a weight loss of 3 pounds over a period of 2 weeks since her last visit. She has lost 3 lbs since starting treatment with [place].  Vitamin D Deficiency Gabriella Walker has a diagnosis of vitamin D deficiency. She is on OTC Viy D 1,000 IU daily. Last Vit D level was of 39.5. She denies nausea, vomiting or muscle weakness.  Pre-Diabetes Gabriella Walker has a diagnosis of pre-diabetes based on her elevated Hgb A1c and was informed this puts her at greater risk of developing diabetes. She notes minimal carbohydrate cravings and she is not on medications. She continues to work on diet and exercise to decrease risk of diabetes. She denies nausea or hypoglycemia.  Hypertension Gabriella Walker is a 78 y.o. adult with hypertension. Gabriella Walker's blood pressure is controlled. She denies chest pain, chest pressure, or headaches. She is working on weight loss to help control her blood pressure with the goal of decreasing her risk of heart attack and stroke.   ASSESSMENT AND PLAN:  Vitamin D deficiency - Plan: Vitamin D, Ergocalciferol, (DRISDOL) 1.25 MG (50000 UT) CAPS capsule  Prediabetes  Essential hypertension  Class 1 obesity with serious comorbidity and body mass index (BMI) of 34.0 to 34.9 in adult, unspecified obesity type  PLAN:  Vitamin D Deficiency Gabriella Walker was informed that low vitamin D levels contributes to  fatigue and are associated with obesity, breast, and colon cancer. Gabriella Walker agrees to start prescription Vit D @50 ,000 IU every week #4 with no refills. She will follow up for routine testing of vitamin D, at least 2-3 times per year. She was informed of the risk of over-replacement of vitamin D and agrees to not increase her dose unless she discusses this with [place] first. Gabriella Walker agrees to follow up with our clinic in 2 weeks.  Pre-Diabetes Gabriella Walker will continue to work on weight loss, exercise, and decreasing simple carbohydrates in her diet to help decrease the risk of diabetes. We dicussed metformin including benefits and risks. She was informed that eating too many simple carbohydrates or too many calories at one sitting increases the likelihood of GI side effects. Gabriella Walker declined metformin for now and a prescription was not written today. We will repeat Hgb A1c and insulin in 3 months. Gabriella Walker agrees to follow up with our clinic in 2 weeks as directed to monitor her progress.  Hypertension We discussed sodium restriction, working on healthy weight loss, and a regular exercise program as the means to achieve improved blood pressure control. Gabriella Walker agreed with this plan and agreed to follow up as directed. We will continue to monitor her blood pressure as well as her progress with the above lifestyle modifications. Gabriella Walker agrees to continue her current medications and will watch for signs of hypotension as she continues her lifestyle modifications. Gabriella Walker  agrees to follow up with our clinic in 2 weeks.  Obesity Gabriella Walker is currently in the action stage of change. As such, her goal is to continue with weight loss efforts She has agreed to follow the Category 2 plan Gabriella Walker has been instructed to work up to a goal of 150 minutes of combined cardio and strengthening exercise per week for weight loss and overall health benefits. We discussed the following Behavioral Modification Strategies today: increasing lean protein  intake, increasing vegetables, work on meal planning and easy cooking plans, better snacking choices, and planning for success   Gabriella Walker has agreed to follow up with our clinic in 2 weeks. She was informed of the importance of frequent follow up visits to maximize her success with intensive lifestyle modifications for her multiple health conditions.  ALLERGIES: Allergies  Allergen Reactions  . Prilosec Otc [Omeprazole Magnesium] Itching    MEDICATIONS: Current Outpatient Medications on File Prior to Visit  Medication Sig Dispense Refill  . Calcium Carb-Cholecalciferol (CALCIUM + D3 PO) Take 2 tablets by mouth daily.    . cholecalciferol (VITAMIN D) 1000 UNITS tablet Take 1,000 Units by mouth daily.    Marland Kitchen ELIQUIS 5 MG TABS tablet TAKE 1 TABLET BY MOUTH TWICE A DAY 180 tablet 1  . exemestane (AROMASIN) 25 MG tablet TAKE 1 TABLET EVERY DAY AFTER BREAKFAST 90 tablet 1  . metoprolol (LOPRESSOR) 50 MG tablet Take 1 tablet by mouth 2 (two) times daily.    Marland Kitchen MYRBETRIQ 25 MG TB24 tablet Take 1 tablet by mouth daily.    . simvastatin (ZOCOR) 20 MG tablet Take 20 mg by mouth daily.    Marland Kitchen telmisartan-hydrochlorothiazide (MICARDIS HCT) 80-25 MG tablet Take 1 tablet by mouth daily.    Marland Kitchen zolpidem (AMBIEN) 5 MG tablet Take 1 tablet upon arrival to the lab for sleep study 1 tablet 0   No current facility-administered medications on file prior to visit.     PAST MEDICAL HISTORY: Past Medical History:  Diagnosis Date  . Adenomatous colon polyp   . Atrial fibrillation ([place])   . Back pain   . Bladder leak   . Breast cancer ([place]) 2015   Right Breast Cancer  . GERD (gastroesophageal reflux disease)   . HTN (hypertension)   . Hyperlipidemia   . Joint pain   . Palpitation   . Radiation 08/03/13-08/24/13   right breast 42.72 gray  . SOB (shortness of breath)   . Swelling   . Wears glasses     PAST SURGICAL HISTORY: Past Surgical History:  Procedure Laterality Date  . APPENDECTOMY  1956  . BREAST  BIOPSY Right 04/22/2013  . BREAST LUMPECTOMY Right 05/29/2013  . BREAST LUMPECTOMY WITH NEEDLE LOCALIZATION AND AXILLARY SENTINEL LYMPH NODE BX Right 05/29/2013   Procedure: BREAST LUMPECTOMY WITH NEEDLE LOCALIZATION AND AXILLARY SENTINEL LYMPH NODE BX;  Surgeon: Edward Jolly, MD;  Location: [facility];  Service: General;  Laterality: Right;  . CARDIAC CATHETERIZATION  2008  . [place]  . KNEE ARTHROSCOPY  2001   right  . RE-EXCISION OF BREAST LUMPECTOMY Right 06/12/2013   Procedure: RE-EXCISION OF BREAST LUMPECTOMY;  Surgeon: Edward Jolly, MD;  Location: [facility];  Service: General;  Laterality: Right;  . TUBAL LIGATION  1976  . UPPER GI ENDOSCOPY      SOCIAL HISTORY: Social History   Tobacco Use  . Smoking status: Never Smoker  . Smokeless tobacco: Never Used  Substance Use Topics  .  Alcohol use: No  . Drug use: No    FAMILY HISTORY: Family History  Problem Relation Age of Onset  . Coronary artery disease Father        MI at age 79  . Heart disease Father   . Heart disease Mother   . Hypertension Mother   . Cancer Cousin 47       had lumpectomy and radiation,  25 years ago  . Colon cancer Neg Hx   . Esophageal cancer Neg Hx   . Rectal cancer Neg Hx   . Stomach cancer Neg Hx     ROS: Review of Systems  Constitutional: Positive for weight loss.  Cardiovascular: Negative for chest pain.       Negative chest pressure  Gastrointestinal: Negative for nausea and vomiting.  Musculoskeletal:       Negative muscle weakness  Neurological: Negative for headaches.  Endo/Heme/Allergies:       Negative hypoglycemia    PHYSICAL EXAM: Blood pressure 115/69, pulse 71, temperature 97.9 F (36.6 C), temperature source Oral, height 5\' 2"  (1.575 m), weight 190 lb (86.2 kg), SpO2 96 %. Body mass index is 34.75 kg/m. Physical Exam Vitals signs reviewed.  Constitutional:      Appearance: Normal appearance. He is  obese.  Cardiovascular:     Rate and Rhythm: Normal rate.     Pulses: Normal pulses.  Pulmonary:     Effort: Pulmonary effort is normal.     Breath sounds: Normal breath sounds.  Musculoskeletal: Normal range of motion.  Skin:    General: Skin is warm and dry.  Neurological:     Mental Status: He is alert and oriented to person, place, and time.  Psychiatric:        Mood and Affect: Mood normal.        Behavior: Behavior normal.     RECENT LABS AND TESTS: BMET    Component Value Date/Time   NA 134 02/25/2018 0000   NA 138 07/20/2016 1005   K 4.6 02/25/2018 0000   K 4.0 07/20/2016 1005   CL 95 (L) 02/25/2018 0000   CO2 24 02/25/2018 0000   CO2 26 07/20/2016 1005   GLUCOSE 94 02/25/2018 0000   GLUCOSE 102 (H) 01/17/2018 0945   GLUCOSE 108 07/20/2016 1005   BUN 21 02/25/2018 0000   BUN 15.0 07/20/2016 1005   CREATININE 0.93 02/25/2018 0000   CREATININE 1.01 (H) 01/17/2018 0945   CREATININE 1.0 07/20/2016 1005   CALCIUM 9.5 02/25/2018 0000   CALCIUM 10.0 07/20/2016 1005   GFRNONAA 59 (L) 02/25/2018 0000   GFRNONAA 53 (L) 01/17/2018 0945   GFRAA 68 02/25/2018 0000   GFRAA >60 01/17/2018 0945   Lab Results  Component Value Date   HGBA1C 6.0 (H) 02/25/2018   Lab Results  Component Value Date   INSULIN 9.7 02/25/2018   CBC    Component Value Date/Time   WBC 9.5 01/17/2018 0945   WBC 8.4 01/18/2017 1112   RBC 4.83 01/17/2018 0945   HGB 13.8 01/17/2018 0945   HGB 14.2 07/20/2016 1005   HCT 39.9 01/17/2018 0945   HCT 41.6 07/20/2016 1005   PLT 250 01/17/2018 0945   PLT 207 07/20/2016 1005   MCV 82.6 01/17/2018 0945   MCV 83.7 07/20/2016 1005   MCH 28.6 01/17/2018 0945   MCHC 34.6 01/17/2018 0945   RDW 12.7 01/17/2018 0945   RDW 13.4 07/20/2016 1005   LYMPHSABS 1.8 01/17/2018 0945   LYMPHSABS 2.6 07/20/2016 1005  MONOABS 0.7 01/17/2018 0945   MONOABS 0.7 07/20/2016 1005   EOSABS 0.2 01/17/2018 0945   EOSABS 0.3 07/20/2016 1005   BASOSABS 0.0 01/17/2018  0945   BASOSABS 0.1 07/20/2016 1005   Iron/TIBC/Ferritin/ %Sat No results found for: IRON, TIBC, FERRITIN, IRONPCTSAT Lipid Panel     Component Value Date/Time   CHOL 140 02/25/2018 0000   TRIG 69 02/25/2018 0000   HDL 48 02/25/2018 0000   LDLCALC 78 02/25/2018 0000   Hepatic Function Panel     Component Value Date/Time   PROT 6.6 02/25/2018 0000   PROT 7.0 07/20/2016 1005   ALBUMIN 4.1 02/25/2018 0000   ALBUMIN 3.7 07/20/2016 1005   AST 15 02/25/2018 0000   AST 13 (L) 01/17/2018 0945   AST 17 07/20/2016 1005   ALT 16 02/25/2018 0000   ALT 11 01/17/2018 0945   ALT 14 07/20/2016 1005   ALKPHOS 119 (H) 02/25/2018 0000   ALKPHOS 131 07/20/2016 1005   BILITOT 0.8 02/25/2018 0000   BILITOT 0.6 01/17/2018 0945   BILITOT 0.56 07/20/2016 1005      Component Value Date/Time   TSH 0.555 02/25/2018 0000   TSH 0.48 02/23/2016 1009      OBESITY BEHAVIORAL INTERVENTION VISIT  Today's visit was # 2   Starting weight: 193 lbs Starting date: 02/25/2018 Today's weight : 190 lbs  Today's date: 03/11/2018 Total lbs lost to date: 3 At least 15 minutes were spent on discussing the following behavioral intervention visit.    03/11/2018  Height 5\' 2"  (1.575 m)  Weight 190 lb (86.2 kg)  BMI (Calculated) 34.74  BLOOD PRESSURE - SYSTOLIC 834  BLOOD PRESSURE - DIASTOLIC 69   Body Fat % 19.6 %    ASK: We discussed the diagnosis of obesity with Charletta Cousin today and Tawnie agreed to give [place] permission to discuss obesity behavioral modification therapy today.  ASSESS: Mekayla has the diagnosis of obesity and her BMI today is 34.74 Earlie is in the action stage of change   ADVISE: Marlee was educated on the multiple health risks of obesity as well as the benefit of weight loss to improve her health. She was advised of the need for long term treatment and the importance of lifestyle modifications to improve her current health and to decrease her risk of future health  problems.  AGREE: Multiple dietary modification options and treatment options were discussed and  Cloria agreed to follow the recommendations documented in the above note.  ARRANGE: Davionne was educated on the importance of frequent visits to treat obesity as outlined per CMS and USPSTF guidelines and agreed to schedule her next follow up appointment today.  I, Trixie Dredge, am acting as transcriptionist for Ilene Qua, MD  I have reviewed the above documentation for accuracy and completeness, and I agree with the above. - Ilene Qua, MD

## 2018-03-12 ENCOUNTER — Other Ambulatory Visit: Payer: Self-pay | Admitting: Oncology

## 2018-03-12 DIAGNOSIS — C50911 Malignant neoplasm of unspecified site of right female breast: Secondary | ICD-10-CM

## 2018-03-21 ENCOUNTER — Other Ambulatory Visit: Payer: Self-pay | Admitting: Cardiology

## 2018-03-21 DIAGNOSIS — I4891 Unspecified atrial fibrillation: Secondary | ICD-10-CM

## 2018-03-21 NOTE — Telephone Encounter (Signed)
Pt is a 78 yr old female who last saw Dr. Stanford Breed on 01/16/18. Last noted weight on 03/11/18 was 86.2Kg. SCr on 02/25/18 was 0.93. Will refill Eliquis 5 mg BID.

## 2018-03-27 ENCOUNTER — Ambulatory Visit (INDEPENDENT_AMBULATORY_CARE_PROVIDER_SITE_OTHER): Payer: Medicare Other | Admitting: Physician Assistant

## 2018-03-27 ENCOUNTER — Encounter (INDEPENDENT_AMBULATORY_CARE_PROVIDER_SITE_OTHER): Payer: Self-pay

## 2018-04-05 ENCOUNTER — Other Ambulatory Visit (INDEPENDENT_AMBULATORY_CARE_PROVIDER_SITE_OTHER): Payer: Self-pay | Admitting: Family Medicine

## 2018-04-05 DIAGNOSIS — E559 Vitamin D deficiency, unspecified: Secondary | ICD-10-CM

## 2018-04-07 ENCOUNTER — Encounter (INDEPENDENT_AMBULATORY_CARE_PROVIDER_SITE_OTHER): Payer: Self-pay

## 2018-04-21 DIAGNOSIS — Z20818 Contact with and (suspected) exposure to other bacterial communicable diseases: Secondary | ICD-10-CM | POA: Diagnosis not present

## 2018-04-21 DIAGNOSIS — R0602 Shortness of breath: Secondary | ICD-10-CM | POA: Diagnosis not present

## 2018-04-21 DIAGNOSIS — R05 Cough: Secondary | ICD-10-CM | POA: Diagnosis not present

## 2018-04-21 DIAGNOSIS — G4733 Obstructive sleep apnea (adult) (pediatric): Secondary | ICD-10-CM | POA: Diagnosis not present

## 2018-04-21 DIAGNOSIS — R5383 Other fatigue: Secondary | ICD-10-CM | POA: Diagnosis not present

## 2018-04-21 DIAGNOSIS — I10 Essential (primary) hypertension: Secondary | ICD-10-CM | POA: Diagnosis not present

## 2018-04-21 DIAGNOSIS — K219 Gastro-esophageal reflux disease without esophagitis: Secondary | ICD-10-CM | POA: Diagnosis not present

## 2018-04-22 ENCOUNTER — Encounter (HOSPITAL_COMMUNITY): Payer: Self-pay

## 2018-04-22 ENCOUNTER — Encounter (HOSPITAL_BASED_OUTPATIENT_CLINIC_OR_DEPARTMENT_OTHER): Payer: Medicare Other

## 2018-04-22 NOTE — Progress Notes (Signed)
Received referral for echo from Reginold Agent, NP of [facility] for shortness of breath. DOD (Dr. Irish Lack) was notified per policy for [id number]. Dr. Irish Lack deemed this echo order as non-urgent and should be scheduled when restriction eases. [employer] and notified the staff there. The person there will route the note to the NP.

## 2018-04-28 ENCOUNTER — Telehealth: Payer: Self-pay | Admitting: Cardiology

## 2018-04-28 DIAGNOSIS — R0602 Shortness of breath: Secondary | ICD-10-CM | POA: Diagnosis not present

## 2018-04-28 DIAGNOSIS — R0609 Other forms of dyspnea: Secondary | ICD-10-CM | POA: Diagnosis not present

## 2018-04-28 DIAGNOSIS — I10 Essential (primary) hypertension: Secondary | ICD-10-CM | POA: Diagnosis not present

## 2018-04-28 DIAGNOSIS — I480 Paroxysmal atrial fibrillation: Secondary | ICD-10-CM | POA: Diagnosis not present

## 2018-04-28 DIAGNOSIS — R5383 Other fatigue: Secondary | ICD-10-CM | POA: Diagnosis not present

## 2018-04-28 NOTE — Telephone Encounter (Signed)
New message   Patient states that she can't do a virtual visit but she can do a televisit on 05/07/2018. Please call to discuss.

## 2018-04-28 NOTE — Telephone Encounter (Signed)
Spoke with patient and she only has a flip phone. Advised ok to make telephone visit, confirmed date and time

## 2018-05-05 ENCOUNTER — Telehealth: Payer: Self-pay | Admitting: Cardiology

## 2018-05-06 NOTE — Progress Notes (Signed)
Virtual Visit via Video Note changed to phone visit at patient request as no smart phone.   This visit type was conducted due to national recommendations for restrictions regarding the COVID-19 Pandemic (e.g. social distancing) in an effort to limit this patient's exposure and mitigate transmission in our community.  Due to his co-morbid illnesses, this patient is at least at moderate risk for complications without adequate follow up.  This format is felt to be most appropriate for this patient at this time.  All issues noted in this document were discussed and addressed.  A limited physical exam was performed with this format.  Please refer to the patient's chart for his consent to telehealth for [facility].   Date:  05/07/2018   ID:  Charletta Cousin, DOB March 12, 1940, MRN [id number]  Patient Location: Home Provider Location: Home  PCP:  [name], MD  Cardiologist:  Dr Stanford Breed  Evaluation Performed:  Follow-Up Visit  Chief Complaint:  FU Atrial fibrillation  History of Present Illness:    FU atrial fibrillation. Cardiac catheterization December 2008 showed 30% PDA but no other obstructive disease and normal LV function. Monitor 11/17 showed PAF. Echocardiogram February 2018 showed normal LV systolic function and grade 1 diastolic dysfunction. Mild LAE. TSH February 2018 0.48.Nuclear study July 2018 showed ejection fraction 66% and normal perfusion.Pt in atrial fibrillation at last ov but spontaneously converted to sinus.Since last seen,patient has dyspnea on exertion but no orthopnea, PND or pedal edema.  No chest pain or syncope.  Occasional "hot spell".  Only one episode of atrial fibrillation that was brief since her last office visit.   The patient does not have symptoms concerning for COVID-19 infection (fever, chills, cough, or new shortness of breath).    Past Medical History:  Diagnosis Date  . Adenomatous colon polyp   . Atrial fibrillation ([place])   . Back pain    . Bladder leak   . Breast cancer ([place]) 2015   Right Breast Cancer  . GERD (gastroesophageal reflux disease)   . HTN (hypertension)   . Hyperlipidemia   . Joint pain   . Palpitation   . Radiation 08/03/13-08/24/13   right breast 42.72 gray  . SOB (shortness of breath)   . Swelling   . Wears glasses    Past Surgical History:  Procedure Laterality Date  . APPENDECTOMY  1956  . BREAST BIOPSY Right 04/22/2013  . BREAST LUMPECTOMY Right 05/29/2013  . BREAST LUMPECTOMY WITH NEEDLE LOCALIZATION AND AXILLARY SENTINEL LYMPH NODE BX Right 05/29/2013   Procedure: BREAST LUMPECTOMY WITH NEEDLE LOCALIZATION AND AXILLARY SENTINEL LYMPH NODE BX;  Surgeon: Edward Jolly, MD;  Location: [place];  Service: General;  Laterality: Right;  . CARDIAC CATHETERIZATION  2008  . [place]  . KNEE ARTHROSCOPY  2001   right  . RE-EXCISION OF BREAST LUMPECTOMY Right 06/12/2013   Procedure: RE-EXCISION OF BREAST LUMPECTOMY;  Surgeon: Edward Jolly, MD;  Location: [place];  Service: General;  Laterality: Right;  . TUBAL LIGATION  1976  . UPPER GI ENDOSCOPY       Current Meds  Medication Sig  . Calcium Carb-Cholecalciferol (CALCIUM + D3 PO) Take 2 tablets by mouth daily.  . cholecalciferol (VITAMIN D) 1000 UNITS tablet Take 1,000 Units by mouth daily.  Marland Kitchen ELIQUIS 5 MG TABS tablet TAKE 1 TABLET BY MOUTH TWICE A DAY  . exemestane (AROMASIN) 25 MG tablet TAKE 1 TABLET EVERY DAY AFTER BREAKFAST  .  famotidine-calcium carbonate-magnesium hydroxide (PEPCID COMPLETE) 10-800-165 MG chewable tablet Chew 1 tablet by mouth daily as needed (acid reflux).  . metoprolol (LOPRESSOR) 50 MG tablet Take 1 tablet by mouth 2 (two) times daily.  Marland Kitchen MYRBETRIQ 25 MG TB24 tablet Take 1 tablet by mouth daily.  Marland Kitchen PROAIR HFA 108 (90 Base) MCG/ACT inhaler Inhale 2 puffs into the lungs every 4 (four) hours as needed for shortness of breath.  . simvastatin (ZOCOR) 20 MG tablet  Take 20 mg by mouth daily.  Marland Kitchen zolpidem (AMBIEN) 5 MG tablet Take 1 tablet upon arrival to the lab for sleep study     Allergies:   Prilosec otc [omeprazole magnesium]   Social History   Tobacco Use  . Smoking status: Never Smoker  . Smokeless tobacco: Never Used  Substance Use Topics  . Alcohol use: No  . Drug use: No     Family Hx: The patient's family history includes Cancer (age of onset: 78) in his cousin; Coronary artery disease in his father; Heart disease in his father and mother; Hypertension in his mother. There is no history of Colon cancer, Esophageal cancer, Rectal cancer, or Stomach cancer.  ROS:   Please see the history of present illness.    No fevers, chills or productive cough. All other systems reviewed and are negative.  Recent Labs: 01/17/2018: Hemoglobin 13.8; Platelet Count 250 02/25/2018: ALT 16; BUN 21; Creatinine, Ser 0.93; Potassium 4.6; Sodium 134; TSH 0.555   Recent Lipid Panel Lab Results  Component Value Date/Time   CHOL 140 02/25/2018 12:00 AM   TRIG 69 02/25/2018 12:00 AM   HDL 48 02/25/2018 12:00 AM   LDLCALC 78 02/25/2018 12:00 AM    Wt Readings from Last 3 Encounters:  05/07/18 188 lb (85.3 kg)  03/11/18 190 lb (86.2 kg)  02/25/18 193 lb (87.5 kg)     Objective:    Vital Signs:  BP (!) 144/62   Pulse (!) 59   Ht 5\' 2"  (1.575 m)   Wt 188 lb (85.3 kg)   BMI 34.39 kg/m    VITAL SIGNS:  reviewed  No acute distress Well-developed well-nourished Answers questions appropriately Normal affect Remainder of physical examination not performed (telehealth visit; coronavirus pandemic)  ASSESSMENT & PLAN:    1. Paroxysmal atrial fibrillation-by history patient states she has been in sinus rhythm.  We will continue with metoprolol for rate control if atrial fibrillation recurs.  Continue apixaban.  We can consider antiarrhythmic therapy versus referral for ablation in the future if she has more frequent episodes. 2.  Hyperlipidemia-continue statin. 3. Hypertension-patient's blood pressure is mildly elevated.  However it was decreased recently and her ARB and amlodipine were discontinued.  I have asked her to follow this and we can resume 1 of her previous medications if needed. 4. Obstructive sleep apnea-managed by Dr. Claiborne Billings.  COVID-19 Education: The importance of social distancing was discussed today.  Time:   Today, I have spent 15 minutes with the patient with telehealth technology discussing the above problems.     Medication Adjustments/Labs and Tests Ordered: Current medicines are reviewed at length with the patient today.  Concerns regarding medicines are outlined above.   Tests Ordered: No orders of the defined types were placed in this encounter.   Medication Changes: No orders of the defined types were placed in this encounter.   Disposition:  Follow up in 6 month(s)  Signed, Kirk Ruths, MD  05/07/2018 10:16 AM    [facility]

## 2018-05-07 ENCOUNTER — Telehealth (INDEPENDENT_AMBULATORY_CARE_PROVIDER_SITE_OTHER): Payer: Medicare Other | Admitting: Cardiology

## 2018-05-07 VITALS — BP 144/62 | HR 59 | Ht 62.0 in | Wt 188.0 lb

## 2018-05-07 DIAGNOSIS — I10 Essential (primary) hypertension: Secondary | ICD-10-CM

## 2018-05-07 DIAGNOSIS — E7800 Pure hypercholesterolemia, unspecified: Secondary | ICD-10-CM

## 2018-05-07 DIAGNOSIS — I480 Paroxysmal atrial fibrillation: Secondary | ICD-10-CM | POA: Diagnosis not present

## 2018-05-07 NOTE — Patient Instructions (Signed)

## 2018-05-12 ENCOUNTER — Other Ambulatory Visit: Payer: Self-pay | Admitting: Internal Medicine

## 2018-05-12 ENCOUNTER — Other Ambulatory Visit (HOSPITAL_COMMUNITY): Payer: Self-pay | Admitting: Internal Medicine

## 2018-05-12 DIAGNOSIS — R0609 Other forms of dyspnea: Secondary | ICD-10-CM

## 2018-05-12 DIAGNOSIS — R0600 Dyspnea, unspecified: Secondary | ICD-10-CM

## 2018-05-13 ENCOUNTER — Other Ambulatory Visit (HOSPITAL_COMMUNITY): Payer: Self-pay | Admitting: Respiratory Therapy

## 2018-05-13 DIAGNOSIS — R0609 Other forms of dyspnea: Secondary | ICD-10-CM

## 2018-05-13 DIAGNOSIS — R0600 Dyspnea, unspecified: Secondary | ICD-10-CM

## 2018-05-22 ENCOUNTER — Telehealth: Payer: Self-pay | Admitting: *Deleted

## 2018-05-22 NOTE — Telephone Encounter (Signed)
Spoke with pt, aware patient assistance paperwork faxed to [facility] [employer] for eliquis.

## 2018-05-23 ENCOUNTER — Telehealth (HOSPITAL_COMMUNITY): Payer: Self-pay | Admitting: Radiology

## 2018-05-23 NOTE — Telephone Encounter (Signed)
Left message with echocardiogram instructions-unable to do preCOVID screening.

## 2018-05-27 ENCOUNTER — Other Ambulatory Visit: Payer: Self-pay | Admitting: General Surgery

## 2018-05-27 ENCOUNTER — Other Ambulatory Visit: Payer: Self-pay

## 2018-05-27 ENCOUNTER — Ambulatory Visit (HOSPITAL_COMMUNITY): Payer: Medicare Other | Attending: Cardiovascular Disease

## 2018-05-27 DIAGNOSIS — Z853 Personal history of malignant neoplasm of breast: Secondary | ICD-10-CM

## 2018-05-27 DIAGNOSIS — R0609 Other forms of dyspnea: Secondary | ICD-10-CM | POA: Diagnosis not present

## 2018-05-27 DIAGNOSIS — R0600 Dyspnea, unspecified: Secondary | ICD-10-CM

## 2018-05-28 ENCOUNTER — Telehealth: Payer: Self-pay | Admitting: *Deleted

## 2018-05-28 NOTE — Telephone Encounter (Signed)
Called patient to discuss eliquis application and she reports she was told by her PCP to call us. She was awakened in her sleep last night by elevated heart rate. At 4:30 am her bp=153/108 106 bpm, her heart rate fluctuated between 73 and 110 bpm until around 11 am. Since then her bp= 119/62 65 bpm. During the episode she felt tight in her chest and very weak. This is the longest episode she has had since being seen. She has had 2-3 episodes since 05-07-2018 but they have not lasted as long. Will forward for dr Stanford Breed review

## 2018-05-29 NOTE — Telephone Encounter (Signed)
Spoke with pt, Aware of dr Jacalyn Lefevre recommendations. Message sent to Atrial Fib clinic to schedule.

## 2018-05-29 NOTE — Telephone Encounter (Signed)
Refer to atrial fibrillation clinic [facility]

## 2018-06-02 ENCOUNTER — Ambulatory Visit (INDEPENDENT_AMBULATORY_CARE_PROVIDER_SITE_OTHER): Payer: Medicare Other | Admitting: Family Medicine

## 2018-06-03 ENCOUNTER — Other Ambulatory Visit: Payer: Self-pay

## 2018-06-03 ENCOUNTER — Encounter (INDEPENDENT_AMBULATORY_CARE_PROVIDER_SITE_OTHER): Payer: Self-pay | Admitting: Family Medicine

## 2018-06-03 ENCOUNTER — Ambulatory Visit (INDEPENDENT_AMBULATORY_CARE_PROVIDER_SITE_OTHER): Payer: Medicare Other | Admitting: Family Medicine

## 2018-06-03 DIAGNOSIS — E669 Obesity, unspecified: Secondary | ICD-10-CM

## 2018-06-03 DIAGNOSIS — Z6834 Body mass index (BMI) 34.0-34.9, adult: Secondary | ICD-10-CM | POA: Diagnosis not present

## 2018-06-03 DIAGNOSIS — I10 Essential (primary) hypertension: Secondary | ICD-10-CM

## 2018-06-03 NOTE — Progress Notes (Addendum)
Office: [phone]  /  Fax: [phone] TeleHealth Visit:  Gabriella Walker has verbally consented to this TeleHealth visit today. The patient is located at home, the provider is located at the [facility] office. The participants in this visit include the listed provider and patient . The visit was conducted today via telephone call. Call was 15 minutes in length.  HPI:   Chief Complaint: OBESITY Gabriella Walker is here to discuss his progress with his obesity treatment plan. He is on the  follow the Category 1 plan and is following his eating plan approximately 90 % of the time. He states he is exercising 0 minutes 0 times per week. Gabriella Walker reports a weight of 184 lbs today. She has not had a visit with [place] since 03/11/18 and weight at that time was 190 lbs. She has done very well on Category 1 plan since last visit and has not had any sweets. She denies hunger and reports she likes the plan.  We were unable to weigh the patient today for this TeleHealth visit. He feels as if he has lost weight since his last visit. He has lost 3 lbs since starting treatment with [place].  Hypertension Gabriella Walker is a 78 y.o. adult with hypertension.  Gabriella Walker denies chest pain but reports shortness of breath on exertion. She is working weight loss to help control his blood pressure with the goal of decreasing his risk of heart attack and stroke. Gabriella Walker blood pressure usually runs 140s/80s but is high today at 145/101. 140s/80s but is high today at 145/101. Telmisartan was recently added by her PCP. She is also taking metoprolol. Her blood pressure increases when she has episodes of A-fib.  She has an appointment with the Afib clinic next week. BP Readings from Last 3 Encounters:  05/07/18 (!) 144/62  03/11/18 115/69  02/25/18 131/68     ASSESSMENT AND PLAN:  Essential hypertension  Class 1 obesity with serious comorbidity and body mass index (BMI) of 34.0 to 34.9 in adult, unspecified obesity type  PLAN: Hypertension We  discussed sodium restriction, working on healthy weight loss, and a regular exercise program as the means to achieve improved blood pressure control. Gabriella Walker agreed with this plan and agreed to follow up as directed. We will continue to monitor his blood pressure as well as his progress with the above lifestyle modifications. He will continue his medications as prescribed and has follow-up wit A-fib clinic on 06/09/18. She will watch for signs of hypotension as he continues his lifestyle modifications.  Obesity Gabriella Walker is currently in the action stage of change. As such, his goal is to continue with weight loss efforts He has agreed to follow the Category 2 plan We discussed the following Behavioral Modification Strategies today: planning for success   Gabriella Walker has agreed to follow up with our clinic in 2 weeks. He was informed of the importance of frequent follow up visits to maximize his success with intensive lifestyle modifications for his multiple health conditions.  ALLERGIES: Allergies  Allergen Reactions  . Prilosec Otc [Omeprazole Magnesium] Itching    MEDICATIONS: Current Outpatient Medications on File Prior to Visit  Medication Sig Dispense Refill  . telmisartan (MICARDIS) 80 MG tablet Take 80 mg by mouth daily.    . Calcium Carb-Cholecalciferol (CALCIUM + D3 PO) Take 2 tablets by mouth daily.    . cholecalciferol (VITAMIN D) 1000 UNITS tablet Take 1,000 Units by mouth daily.    Marland Kitchen ELIQUIS 5 MG TABS tablet TAKE 1 TABLET BY MOUTH  TWICE A DAY 60 tablet 10  . exemestane (AROMASIN) 25 MG tablet TAKE 1 TABLET EVERY DAY AFTER BREAKFAST 90 tablet 1  . famotidine-calcium carbonate-magnesium hydroxide (PEPCID COMPLETE) 10-800-165 MG chewable tablet Chew 1 tablet by mouth daily as needed (acid reflux).    . metoprolol (LOPRESSOR) 50 MG tablet Take 1 tablet by mouth 2 (two) times daily.    Marland Kitchen MYRBETRIQ 25 MG TB24 tablet Take 1 tablet by mouth daily.    Marland Kitchen PROAIR HFA 108 (90 Base) MCG/ACT inhaler  Inhale 2 puffs into the lungs every 4 (four) hours as needed for shortness of breath.    . simvastatin (ZOCOR) 20 MG tablet Take 20 mg by mouth daily.    Marland Kitchen zolpidem (AMBIEN) 5 MG tablet Take 1 tablet upon arrival to the lab for sleep study 1 tablet 0   No current facility-administered medications on file prior to visit.     PAST MEDICAL HISTORY: Past Medical History:  Diagnosis Date  . Adenomatous colon polyp   . Atrial fibrillation ([place])   . Back pain   . Bladder leak   . Breast cancer ([place]) 2015   Right Breast Cancer  . GERD (gastroesophageal reflux disease)   . HTN (hypertension)   . Hyperlipidemia   . Joint pain   . Palpitation   . Radiation 08/03/13-08/24/13   right breast 42.72 gray  . SOB (shortness of breath)   . Swelling   . Wears glasses     PAST SURGICAL HISTORY: Past Surgical History:  Procedure Laterality Date  . APPENDECTOMY  1956  . BREAST BIOPSY Right 04/22/2013  . BREAST LUMPECTOMY Right 05/29/2013  . BREAST LUMPECTOMY WITH NEEDLE LOCALIZATION AND AXILLARY SENTINEL LYMPH NODE BX Right 05/29/2013   Procedure: BREAST LUMPECTOMY WITH NEEDLE LOCALIZATION AND AXILLARY SENTINEL LYMPH NODE BX;  Surgeon: Edward Jolly, MD;  Location: [place];  Service: General;  Laterality: Right;  . CARDIAC CATHETERIZATION  2008  . [place]  . KNEE ARTHROSCOPY  2001   right  . RE-EXCISION OF BREAST LUMPECTOMY Right 06/12/2013   Procedure: RE-EXCISION OF BREAST LUMPECTOMY;  Surgeon: Edward Jolly, MD;  Location: [place];  Service: General;  Laterality: Right;  . TUBAL LIGATION  1976  . UPPER GI ENDOSCOPY      SOCIAL HISTORY: Social History   Tobacco Use  . Smoking status: Never Smoker  . Smokeless tobacco: Never Used  Substance Use Topics  . Alcohol use: No  . Drug use: No    FAMILY HISTORY: Family History  Problem Relation Age of Onset  . Coronary artery disease Father        MI at age 62  . Heart  disease Father   . Heart disease Mother   . Hypertension Mother   . Cancer Cousin 47       had lumpectomy and radiation,  25 years ago  . Colon cancer Neg Hx   . Esophageal cancer Neg Hx   . Rectal cancer Neg Hx   . Stomach cancer Neg Hx     ROS: Review of Systems  Respiratory: Positive for shortness of breath.   Cardiovascular: Negative for chest pain.    PHYSICAL EXAM: Pt in no acute distress  RECENT LABS AND TESTS: BMET    Component Value Date/Time   NA 134 02/25/2018 0000   NA 138 07/20/2016 1005   K 4.6 02/25/2018 0000   K 4.0 07/20/2016 1005   CL 95 (L)  02/25/2018 0000   CO2 24 02/25/2018 0000   CO2 26 07/20/2016 1005   GLUCOSE 94 02/25/2018 0000   GLUCOSE 102 (H) 01/17/2018 0945   GLUCOSE 108 07/20/2016 1005   BUN 21 02/25/2018 0000   BUN 15.0 07/20/2016 1005   CREATININE 0.93 02/25/2018 0000   CREATININE 1.01 (H) 01/17/2018 0945   CREATININE 1.0 07/20/2016 1005   CALCIUM 9.5 02/25/2018 0000   CALCIUM 10.0 07/20/2016 1005   GFRNONAA 59 (L) 02/25/2018 0000   GFRNONAA 53 (L) 01/17/2018 0945   GFRAA 68 02/25/2018 0000   GFRAA >60 01/17/2018 0945   Lab Results  Component Value Date   HGBA1C 6.0 (H) 02/25/2018   Lab Results  Component Value Date   INSULIN 9.7 02/25/2018   CBC    Component Value Date/Time   WBC 9.5 01/17/2018 0945   WBC 8.4 01/18/2017 1112   RBC 4.83 01/17/2018 0945   HGB 13.8 01/17/2018 0945   HGB 14.2 07/20/2016 1005   HCT 39.9 01/17/2018 0945   HCT 41.6 07/20/2016 1005   PLT 250 01/17/2018 0945   PLT 207 07/20/2016 1005   MCV 82.6 01/17/2018 0945   MCV 83.7 07/20/2016 1005   MCH 28.6 01/17/2018 0945   MCHC 34.6 01/17/2018 0945   RDW 12.7 01/17/2018 0945   RDW 13.4 07/20/2016 1005   LYMPHSABS 1.8 01/17/2018 0945   LYMPHSABS 2.6 07/20/2016 1005   MONOABS 0.7 01/17/2018 0945   MONOABS 0.7 07/20/2016 1005   EOSABS 0.2 01/17/2018 0945   EOSABS 0.3 07/20/2016 1005   BASOSABS 0.0 01/17/2018 0945   BASOSABS 0.1 07/20/2016  1005   Iron/TIBC/Ferritin/ %Sat No results found for: IRON, TIBC, FERRITIN, IRONPCTSAT Lipid Panel     Component Value Date/Time   CHOL 140 02/25/2018 0000   TRIG 69 02/25/2018 0000   HDL 48 02/25/2018 0000   LDLCALC 78 02/25/2018 0000   Hepatic Function Panel     Component Value Date/Time   PROT 6.6 02/25/2018 0000   PROT 7.0 07/20/2016 1005   ALBUMIN 4.1 02/25/2018 0000   ALBUMIN 3.7 07/20/2016 1005   AST 15 02/25/2018 0000   AST 13 (L) 01/17/2018 0945   AST 17 07/20/2016 1005   ALT 16 02/25/2018 0000   ALT 11 01/17/2018 0945   ALT 14 07/20/2016 1005   ALKPHOS 119 (H) 02/25/2018 0000   ALKPHOS 131 07/20/2016 1005   BILITOT 0.8 02/25/2018 0000   BILITOT 0.6 01/17/2018 0945   BILITOT 0.56 07/20/2016 1005      Component Value Date/Time   TSH 0.555 02/25/2018 0000   TSH 0.48 02/23/2016 1009      I, Renee Ramus, am acting as Location manager for [employer], FNP-C.  I have reviewed the above documentation for accuracy and completeness, and I agree with the above.  - Treniya Lobb, FNP-C.

## 2018-06-05 ENCOUNTER — Encounter (INDEPENDENT_AMBULATORY_CARE_PROVIDER_SITE_OTHER): Payer: Self-pay | Admitting: Family Medicine

## 2018-06-05 DIAGNOSIS — Z6834 Body mass index (BMI) 34.0-34.9, adult: Secondary | ICD-10-CM | POA: Insufficient documentation

## 2018-06-05 DIAGNOSIS — E669 Obesity, unspecified: Secondary | ICD-10-CM | POA: Insufficient documentation

## 2018-06-09 ENCOUNTER — Encounter (HOSPITAL_COMMUNITY): Payer: Self-pay | Admitting: Nurse Practitioner

## 2018-06-09 ENCOUNTER — Other Ambulatory Visit: Payer: Self-pay

## 2018-06-09 ENCOUNTER — Ambulatory Visit (HOSPITAL_COMMUNITY)
Admission: RE | Admit: 2018-06-09 | Discharge: 2018-06-09 | Disposition: A | Discharge status: Home / Self Care | Payer: Medicare Other | Source: Ambulatory Visit | Attending: Nurse Practitioner | Admitting: Nurse Practitioner

## 2018-06-09 VITALS — BP 128/64 | HR 61 | Ht 62.0 in | Wt 184.0 lb

## 2018-06-09 DIAGNOSIS — I480 Paroxysmal atrial fibrillation: Secondary | ICD-10-CM

## 2018-06-09 DIAGNOSIS — Z923 Personal history of irradiation: Secondary | ICD-10-CM | POA: Diagnosis not present

## 2018-06-09 DIAGNOSIS — C50911 Malignant neoplasm of unspecified site of right female breast: Secondary | ICD-10-CM | POA: Diagnosis not present

## 2018-06-09 DIAGNOSIS — Z7901 Long term (current) use of anticoagulants: Secondary | ICD-10-CM | POA: Insufficient documentation

## 2018-06-09 DIAGNOSIS — E785 Hyperlipidemia, unspecified: Secondary | ICD-10-CM | POA: Diagnosis not present

## 2018-06-09 DIAGNOSIS — Z79899 Other long term (current) drug therapy: Secondary | ICD-10-CM | POA: Diagnosis not present

## 2018-06-09 DIAGNOSIS — I10 Essential (primary) hypertension: Secondary | ICD-10-CM | POA: Insufficient documentation

## 2018-06-09 DIAGNOSIS — Z79811 Long term (current) use of aromatase inhibitors: Secondary | ICD-10-CM | POA: Diagnosis not present

## 2018-06-09 DIAGNOSIS — Z888 Allergy status to other drugs, medicaments and biological substances status: Secondary | ICD-10-CM | POA: Diagnosis not present

## 2018-06-09 DIAGNOSIS — G4733 Obstructive sleep apnea (adult) (pediatric): Secondary | ICD-10-CM | POA: Diagnosis not present

## 2018-06-09 DIAGNOSIS — Z8249 Family history of ischemic heart disease and other diseases of the circulatory system: Secondary | ICD-10-CM | POA: Diagnosis not present

## 2018-06-09 MED ORDER — DILTIAZEM HCL 30 MG PO TABS: ORAL_TABLET | ORAL | 1 refills | Status: AC

## 2018-06-09 NOTE — Progress Notes (Signed)
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 Stanford Breed. Pt usually has 1-2 episodes of afib per month that is over in 45 mins or an hour. The other night, she had an episode that lasted most of the night. Interesting, most of her episodes come on at night. She is pending a CPAP titration trial after her sleep apnea test in February showed OSA.It  has been delayed by covid restrictions.  She is also looking to move to [facility] to be close to her son this summer.   Today, he denies symptoms of palpitations, chest pain, shortness of breath, orthopnea, PND, lower extremity edema, dizziness, presyncope, syncope, or neurologic sequela. The patient is tolerating medications without difficulties and is otherwise without complaint today.   Past Medical History:  Diagnosis Date  . Adenomatous colon polyp   . Atrial fibrillation ([place])   . Back pain   . Bladder leak   . Breast cancer ([place]) 2015   Right Breast Cancer  . GERD (gastroesophageal reflux disease)   . HTN (hypertension)   . Hyperlipidemia   . Joint pain   . Palpitation   . Radiation 08/03/13-08/24/13   right breast 42.72 gray  . SOB (shortness of breath)   . Swelling   . Wears glasses    Past Surgical History:  Procedure Laterality Date  . APPENDECTOMY  1956  . BREAST BIOPSY Right 04/22/2013  . BREAST LUMPECTOMY Right 05/29/2013  . BREAST LUMPECTOMY WITH NEEDLE LOCALIZATION AND AXILLARY SENTINEL LYMPH NODE BX Right 05/29/2013   Procedure: BREAST LUMPECTOMY WITH NEEDLE LOCALIZATION AND AXILLARY SENTINEL LYMPH NODE BX;  Surgeon: Edward Jolly, MD;  Location: [place];  Service: General;  Laterality: Right;  . CARDIAC CATHETERIZATION  2008  . [place]  . KNEE ARTHROSCOPY  2001   right  . RE-EXCISION OF BREAST LUMPECTOMY Right 06/12/2013   Procedure: RE-EXCISION OF BREAST LUMPECTOMY;  Surgeon: Edward Jolly, MD;  Location: [place];  Service: General;  Laterality: Right;  . TUBAL LIGATION  1976  . UPPER GI ENDOSCOPY      Current Outpatient Medications  Medication Sig Dispense Refill  . Calcium Carb-Cholecalciferol (CALCIUM + D3 PO) Take 2 tablets by mouth daily.    Marland Kitchen ELIQUIS 5 MG TABS tablet TAKE 1 TABLET BY MOUTH TWICE A DAY 60 tablet 10  . exemestane (AROMASIN) 25 MG tablet TAKE 1 TABLET EVERY DAY AFTER BREAKFAST 90 tablet 1  . famotidine-calcium carbonate-magnesium hydroxide (PEPCID COMPLETE) 10-800-165 MG chewable tablet Chew 1 tablet by mouth daily as needed (acid reflux).    . metoprolol (LOPRESSOR) 50 MG tablet Take 1 tablet by mouth 2 (two) times daily.    Marland Kitchen MYRBETRIQ 25 MG TB24 tablet Take 1 tablet by mouth daily.    Marland Kitchen PROAIR HFA 108 (90 Base) MCG/ACT inhaler Inhale 2 puffs into the lungs every 4 (four) hours as needed for shortness of breath.    . simvastatin (ZOCOR) 20 MG tablet Take 20 mg by mouth daily.    Marland Kitchen telmisartan (MICARDIS) 80 MG tablet Take 80 mg by mouth daily.    Marland Kitchen zolpidem (AMBIEN) 5 MG tablet Take 1 tablet upon arrival to the lab for sleep study (Patient not taking: Reported on 06/09/2018) 1 tablet 0  No current facility-administered medications for this encounter.     Allergies  Allergen Reactions  . Prilosec Otc [Omeprazole Magnesium] Itching    Social History   Socioeconomic History  . Marital status: Widowed    Spouse name: Not on file  . Number of children: 2  . Years of education: Not on file  . Highest education level: Not on file  Occupational History  . Occupation: retired  Scientific laboratory technician  . Financial resource strain: Not on file  . Food insecurity:    Worry: Not on file    Inability: Not on file  . Transportation needs:    Medical: Not on file    Non-medical: Not on file  Tobacco Use  .  Smoking status: Never Smoker  . Smokeless tobacco: Never Used  Substance and Sexual Activity  . Alcohol use: No  . Drug use: No  . Sexual activity: Not on file  Lifestyle  . Physical activity:    Days per week: Not on file    Minutes per session: Not on file  . Stress: Not on file  Relationships  . Social connections:    Talks on phone: Not on file    Gets together: Not on file    Attends religious service: Not on file    Active member of club or organization: Not on file    Attends meetings of clubs or organizations: Not on file    Relationship status: Not on file  . Intimate partner violence:    Fear of current or ex partner: Not on file    Emotionally abused: Not on file    Physically abused: Not on file    Forced sexual activity: Not on file  Other Topics Concern  . Not on file  Social History Narrative  . Not on file    Family History  Problem Relation Age of Onset  . Coronary artery disease Father        MI at age 58  . Heart disease Father   . Heart disease Mother   . Hypertension Mother   . Cancer Cousin 47       had lumpectomy and radiation,  25 years ago  . Colon cancer Neg Hx   . Esophageal cancer Neg Hx   . Rectal cancer Neg Hx   . Stomach cancer Neg Hx     ROS- All systems are reviewed and negative except as per the HPI above  Physical Exam: Vitals:   06/09/18 1010  BP: 128/64  Pulse: 61  Weight: 83.5 kg  Height: 5\' 2"  (1.575 m)   Wt Readings from Last 3 Encounters:  06/09/18 83.5 kg  05/07/18 85.3 kg  03/11/18 86.2 kg    Labs: Lab Results  Component Value Date   NA 134 02/25/2018   K 4.6 02/25/2018   CL 95 (L) 02/25/2018   CO2 24 02/25/2018   GLUCOSE 94 02/25/2018   BUN 21 02/25/2018   CREATININE 0.93 02/25/2018   CALCIUM 9.5 02/25/2018   No results found for: INR Lab Results  Component Value Date   CHOL 140 02/25/2018   HDL 48 02/25/2018   LDLCALC 78 02/25/2018   TRIG 69 02/25/2018     GEN- The patient is well  appearing, alert and oriented x 3 today.   Head- normocephalic, atraumatic Eyes-  Sclera clear, conjunctiva pink Ears- hearing intact Oropharynx- clear Neck- supple, no JVP Lymph- no cervical lymphadenopathy Lungs- Clear to ausculation bilaterally, normal work of breathing Heart- Regular  rate and rhythm, no murmurs, rubs or gallops, PMI not laterally displaced GI- soft, NT, ND, + BS Extremities- no clubbing, cyanosis, or edema MS- no significant deformity or atrophy Skin- no rash or lesion Psych- euthymic mood, full affect Neuro- strength and sensation are intact  EKG-NSR at 61 bpm    Assessment and Plan: 1. Paroxysmal afib Pt overall has low afib burden but did have a prolonged episode that happened the other week that concerned her I expect untreated sleep apnea is triggering the spells since that mostly occur at night She is pending cpap titration when it can be rescheduled She would like to avoid antiarrythmic's at this point, discussed today flecainide and amiodarone She is not an ablation candidate as this point as she has not used antiarrythmic's For now I will give her 30 mg Cardizem to take as needed, she has used extra metoprolol in the past but this should work better for her Continue BB dose without change for now She has a HR in the 60's at rest so do not want to increase her daily BB dose Denies any alcohol, tobacco use, no excessive caffeine   2.CHA2DS2VASc score is 4 Continue eliquis 5 mg bid  3. HTN Stable  4. OSA Cpap  trial pending    F/u in one month   Butch Penny C. Ella Golomb, [facility] [address] [place], [place] [address] [phone]

## 2018-06-09 NOTE — Patient Instructions (Signed)
Cardizem 30mg -- take 1 tablet every 4 hours AS NEEDED for AFIB heart rate >100 as long as top number of blood pressure >100.   ?

## 2018-06-11 DIAGNOSIS — H2513 Age-related nuclear cataract, bilateral: Secondary | ICD-10-CM | POA: Diagnosis not present

## 2018-06-17 ENCOUNTER — Other Ambulatory Visit: Payer: Self-pay

## 2018-06-17 ENCOUNTER — Ambulatory Visit (INDEPENDENT_AMBULATORY_CARE_PROVIDER_SITE_OTHER): Payer: Medicare Other | Admitting: Family Medicine

## 2018-06-17 ENCOUNTER — Encounter (INDEPENDENT_AMBULATORY_CARE_PROVIDER_SITE_OTHER): Payer: Self-pay | Admitting: Family Medicine

## 2018-06-17 DIAGNOSIS — E669 Obesity, unspecified: Secondary | ICD-10-CM | POA: Diagnosis not present

## 2018-06-17 DIAGNOSIS — I10 Essential (primary) hypertension: Secondary | ICD-10-CM

## 2018-06-17 DIAGNOSIS — Z6834 Body mass index (BMI) 34.0-34.9, adult: Secondary | ICD-10-CM

## 2018-06-17 NOTE — Progress Notes (Signed)
Office: [phone]  /  Fax: [phone] TeleHealth Visit:  Gabriella Walker has verbally consented to this TeleHealth visit today. The patient is located at home, the provider is located at the [facility] office. The participants in this visit include the listed provider and patient. The visit was conducted today via telephone call only (no video capability) - 17 minutes.  HPI:   Chief Complaint: OBESITY Gabriella Walker is here to discuss her progress with her obesity treatment plan. She is on the Category 1 plan and is following her eating plan approximately 100% of the time. She states she is walking 10 minutes 7 times per week. Gabriella Walker states she weighed 182 lbs today reflecting a weight loss of 2 lbs. She is not eating sweets and does not crave them anymore. She states she does not weigh her meat at lunch. We were unable to weigh the patient today for this TeleHealth visit. She feels as if she has lost weight since her last visit. She has lost 3 lbs since starting treatment with [place].  Hypertension Gabriella Walker is a 78 y.o. female with hypertension, fairly well controlled on metoprolol and Micardis.  Gabriella Walker denies chest pain or shortness of breath on exertion. She is working weight loss to help control her blood pressure with the goal of decreasing her risk of heart attack and stroke. Gabriella Walker's blood pressure is 145/65 today. Her systolic has been running in the 140's at home. BP Readings from Last 3 Encounters:  06/09/18 128/64  05/07/18 (!) 144/62  03/11/18 115/69    ASSESSMENT AND PLAN:  Essential hypertension  Class 1 obesity with serious comorbidity and body mass index (BMI) of 34.0 to 34.9 in adult, unspecified obesity type  PLAN:  Hypertension We discussed sodium restriction, working on healthy weight loss, and a regular exercise program as the means to achieve improved blood pressure control. Gabriella Walker agreed with this plan and agreed to follow up as directed. We  will continue to monitor her blood pressure as well as her progress with the above lifestyle modifications. She will continue metoprolol and Micardis as prescribed and will watch for signs of hypotension as she continues her lifestyle modifications.  I spent > than 50% of the 15 minute visit on counseling as documented in the note.  Obesity Gabriella Walker is currently in the action stage of change. As such, her goal is to continue with weight loss efforts. She has agreed to follow the Category 1 plan. She may do 1 [place] yogurt rather than 2 ounces of meat at lunch and she may have oranges 2-3 days a week. Handouts were sent to the patient via MyChart on [employer]. Gabriella Walker has been instructed to continue her current exercise regimen for weight loss and overall health benefits. We discussed the following Behavioral Modification Strategies today: increasing lean protein intake and planning for success.  Gabriella Walker has agreed to follow-up with our clinic in 2 weeks. She was informed of the importance of frequent follow-up visits to maximize her success with intensive lifestyle modifications for her multiple health conditions.  ALLERGIES: Allergies  Allergen Reactions  . Prilosec Otc [Omeprazole Magnesium] Itching    MEDICATIONS: Current Outpatient Medications on File Prior to Visit  Medication Sig Dispense Refill  . Calcium Carb-Cholecalciferol (CALCIUM + D3 PO) Take 2 tablets by mouth daily.    Marland Kitchen diltiazem (CARDIZEM) 30 MG tablet Take 1 tablet every 4 hours AS NEEDED for AFIB heart rate >100 45 tablet 1  .  ELIQUIS 5 MG TABS tablet TAKE 1 TABLET BY MOUTH TWICE A DAY 60 tablet 10  . exemestane (AROMASIN) 25 MG tablet TAKE 1 TABLET EVERY DAY AFTER BREAKFAST 90 tablet 1  . famotidine-calcium carbonate-magnesium hydroxide (PEPCID COMPLETE) 10-800-165 MG chewable tablet Chew 1 tablet by mouth daily as needed (acid reflux).    . metoprolol (LOPRESSOR) 50 MG tablet Take 1 tablet by mouth 2 (two) times daily.     Marland Kitchen MYRBETRIQ 25 MG TB24 tablet Take 1 tablet by mouth daily.    Marland Kitchen PROAIR HFA 108 (90 Base) MCG/ACT inhaler Inhale 2 puffs into the lungs every 4 (four) hours as needed for shortness of breath.    . simvastatin (ZOCOR) 20 MG tablet Take 20 mg by mouth daily.    Marland Kitchen telmisartan (MICARDIS) 80 MG tablet Take 80 mg by mouth daily.    Marland Kitchen zolpidem (AMBIEN) 5 MG tablet Take 1 tablet upon arrival to the lab for sleep study (Patient not taking: Reported on 06/17/2018) 1 tablet 0   No current facility-administered medications on file prior to visit.     PAST MEDICAL HISTORY: Past Medical History:  Diagnosis Date  . Adenomatous colon polyp   . Atrial fibrillation ([place])   . Back pain   . Bladder leak   . Breast cancer ([place]) 2015   Right Breast Cancer  . GERD (gastroesophageal reflux disease)   . HTN (hypertension)   . Hyperlipidemia   . Joint pain   . Palpitation   . Radiation 08/03/13-08/24/13   right breast 42.72 gray  . SOB (shortness of breath)   . Swelling   . Wears glasses     PAST SURGICAL HISTORY: Past Surgical History:  Procedure Laterality Date  . APPENDECTOMY  1956  . BREAST BIOPSY Right 04/22/2013  . BREAST LUMPECTOMY Right 05/29/2013  . BREAST LUMPECTOMY WITH NEEDLE LOCALIZATION AND AXILLARY SENTINEL LYMPH NODE BX Right 05/29/2013   Procedure: BREAST LUMPECTOMY WITH NEEDLE LOCALIZATION AND AXILLARY SENTINEL LYMPH NODE BX;  Surgeon: Edward Jolly, MD;  Location: [place];  Service: General;  Laterality: Right;  . CARDIAC CATHETERIZATION  2008  . [place]  . KNEE ARTHROSCOPY  2001   right  . RE-EXCISION OF BREAST LUMPECTOMY Right 06/12/2013   Procedure: RE-EXCISION OF BREAST LUMPECTOMY;  Surgeon: Edward Jolly, MD;  Location: [place];  Service: General;  Laterality: Right;  . TUBAL LIGATION  1976  . UPPER GI ENDOSCOPY      SOCIAL HISTORY: Social History   Tobacco Use  . Smoking status: Never Smoker  .  Smokeless tobacco: Never Used  Substance Use Topics  . Alcohol use: No  . Drug use: No    FAMILY HISTORY: Family History  Problem Relation Age of Onset  . Coronary artery disease Father        MI at age 25  . Heart disease Father   . Heart disease Mother   . Hypertension Mother   . Cancer Cousin 47       had lumpectomy and radiation,  25 years ago  . Colon cancer Neg Hx   . Esophageal cancer Neg Hx   . Rectal cancer Neg Hx   . Stomach cancer Neg Hx    ROS: Review of Systems  Respiratory: Negative for shortness of breath.   Cardiovascular: Negative for chest pain.   PHYSICAL EXAM: Pt in no acute distress  RECENT LABS AND TESTS: BMET    Component Value Date/Time  NA 134 02/25/2018 0000   NA 138 07/20/2016 1005   K 4.6 02/25/2018 0000   K 4.0 07/20/2016 1005   CL 95 (L) 02/25/2018 0000   CO2 24 02/25/2018 0000   CO2 26 07/20/2016 1005   GLUCOSE 94 02/25/2018 0000   GLUCOSE 102 (H) 01/17/2018 0945   GLUCOSE 108 07/20/2016 1005   BUN 21 02/25/2018 0000   BUN 15.0 07/20/2016 1005   CREATININE 0.93 02/25/2018 0000   CREATININE 1.01 (H) 01/17/2018 0945   CREATININE 1.0 07/20/2016 1005   CALCIUM 9.5 02/25/2018 0000   CALCIUM 10.0 07/20/2016 1005   GFRNONAA 59 (L) 02/25/2018 0000   GFRNONAA 53 (L) 01/17/2018 0945   GFRAA 68 02/25/2018 0000   GFRAA >60 01/17/2018 0945   Lab Results  Component Value Date   HGBA1C 6.0 (H) 02/25/2018   Lab Results  Component Value Date   INSULIN 9.7 02/25/2018   CBC    Component Value Date/Time   WBC 9.5 01/17/2018 0945   WBC 8.4 01/18/2017 1112   RBC 4.83 01/17/2018 0945   HGB 13.8 01/17/2018 0945   HGB 14.2 07/20/2016 1005   HCT 39.9 01/17/2018 0945   HCT 41.6 07/20/2016 1005   PLT 250 01/17/2018 0945   PLT 207 07/20/2016 1005   MCV 82.6 01/17/2018 0945   MCV 83.7 07/20/2016 1005   MCH 28.6 01/17/2018 0945   MCHC 34.6 01/17/2018 0945   RDW 12.7 01/17/2018 0945   RDW 13.4 07/20/2016 1005   LYMPHSABS 1.8 01/17/2018  0945   LYMPHSABS 2.6 07/20/2016 1005   MONOABS 0.7 01/17/2018 0945   MONOABS 0.7 07/20/2016 1005   EOSABS 0.2 01/17/2018 0945   EOSABS 0.3 07/20/2016 1005   BASOSABS 0.0 01/17/2018 0945   BASOSABS 0.1 07/20/2016 1005   Iron/TIBC/Ferritin/ %Sat No results found for: IRON, TIBC, FERRITIN, IRONPCTSAT Lipid Panel     Component Value Date/Time   CHOL 140 02/25/2018 0000   TRIG 69 02/25/2018 0000   HDL 48 02/25/2018 0000   LDLCALC 78 02/25/2018 0000   Hepatic Function Panel     Component Value Date/Time   PROT 6.6 02/25/2018 0000   PROT 7.0 07/20/2016 1005   ALBUMIN 4.1 02/25/2018 0000   ALBUMIN 3.7 07/20/2016 1005   AST 15 02/25/2018 0000   AST 13 (L) 01/17/2018 0945   AST 17 07/20/2016 1005   ALT 16 02/25/2018 0000   ALT 11 01/17/2018 0945   ALT 14 07/20/2016 1005   ALKPHOS 119 (H) 02/25/2018 0000   ALKPHOS 131 07/20/2016 1005   BILITOT 0.8 02/25/2018 0000   BILITOT 0.6 01/17/2018 0945   BILITOT 0.56 07/20/2016 1005      Component Value Date/Time   TSH 0.555 02/25/2018 0000   TSH 0.48 02/23/2016 1009    Results for TEXIE, TUPOU (MRN [id number]) as of 06/17/2018 13:34  Ref. Range 02/25/2018 00:00  Vitamin D, 25-Hydroxy Latest Ref Range: 30.0 - 100.0 ng/mL 39.5    I, Michaelene Song, am acting as Location manager for [employer], FNP  I have reviewed the above documentation for accuracy and completeness, and I agree with the above.  - Brittin Janik, FNP-C.

## 2018-06-20 ENCOUNTER — Ambulatory Visit
Admission: RE | Admit: 2018-06-20 | Discharge: 2018-06-20 | Disposition: A | Discharge status: Home / Self Care | Payer: PRIVATE HEALTH INSURANCE | Source: Ambulatory Visit | Attending: General Surgery | Admitting: General Surgery

## 2018-06-20 ENCOUNTER — Other Ambulatory Visit: Payer: Self-pay

## 2018-06-20 DIAGNOSIS — Z853 Personal history of malignant neoplasm of breast: Secondary | ICD-10-CM

## 2018-06-20 DIAGNOSIS — R922 Inconclusive mammogram: Secondary | ICD-10-CM | POA: Diagnosis not present

## 2018-06-30 NOTE — Telephone Encounter (Signed)
Opened in error

## 2018-07-01 ENCOUNTER — Telehealth (INDEPENDENT_AMBULATORY_CARE_PROVIDER_SITE_OTHER): Payer: Medicare Other | Admitting: Family Medicine

## 2018-07-01 ENCOUNTER — Other Ambulatory Visit: Payer: Self-pay

## 2018-07-01 ENCOUNTER — Encounter (INDEPENDENT_AMBULATORY_CARE_PROVIDER_SITE_OTHER): Payer: Self-pay | Admitting: Family Medicine

## 2018-07-01 DIAGNOSIS — Z6834 Body mass index (BMI) 34.0-34.9, adult: Secondary | ICD-10-CM

## 2018-07-01 DIAGNOSIS — E669 Obesity, unspecified: Secondary | ICD-10-CM

## 2018-07-01 DIAGNOSIS — R7303 Prediabetes: Secondary | ICD-10-CM

## 2018-07-02 NOTE — Progress Notes (Signed)
Office: [phone]  /  Fax: [phone] TeleHealth Visit:  [name] has verbally consented to this TeleHealth visit today. The patient is located at home, the provider is located at the [facility] office. The participants in this visit include the listed provider and patient. The visit was conducted today via telephone call (14 minutes).  HPI:   Chief Complaint: OBESITY Gabriella Walker is here to discuss her progress with her obesity treatment plan. She is on the Category 1 plan and is following her eating plan approximately 100% of the time. She states she is exercising 0 minutes 0 times per week. Gabriella Walker reports she is maintaining her weight and denies hunger. She is working on eating more meat and is weighing her meat now. We were unable to weigh the patient today for this TeleHealth visit. She feels as if she has lost 2 lbs since her last visit. She has lost 3 lbs since starting treatment with [place].  Pre-Diabetes Gabriella Walker has a diagnosis of prediabetes based on her elevated Hgb A1c and was informed this puts her at greater risk of developing diabetes. Her last A1c was 6.0 on 02/25/2018. She states she has totally cut out sweets. She is not taking metformin currently and continues to work on diet and exercise to decrease risk of diabetes. She denies polyphagia.  ASSESSMENT AND PLAN:  Prediabetes  Class 1 obesity with serious comorbidity and body mass index (BMI) of 34.0 to 34.9 in adult, unspecified obesity type  PLAN:  Pre-Diabetes Gabriella Walker will continue to work on weight loss, exercise, and decreasing simple carbohydrates in her diet to help decrease the risk of diabetes.Gabriella Walker will follow-up with [place] as directed to monitor her progress. We will check A1c at her next office visit.  I spent > than 50% of the 15 minute visit on counseling as documented in the note.  Obesity Gabriella Walker is currently in the action stage of change. As such, her goal is to continue with weight loss  efforts. She has agreed to follow the Category 1 plan. [name] will add a glass of [employer] milk at dinner to increase protein.  She will fast for labs at her next visit. Gabriella Walker has been instructed to walk as tolerated for weight loss and overall health benefits. We discussed the following Behavioral Modification Strategies today: increasing lean protein intake and planning for success.  Gabriella Walker has agreed to follow-up with our clinic in 2 weeks. She was informed of the importance of frequent follow-up visits to maximize her success with intensive lifestyle modifications for her multiple health conditions.  ALLERGIES: Allergies  Allergen Reactions  . Prilosec Otc [Omeprazole Magnesium] Itching    MEDICATIONS: Current Outpatient Medications on File Prior to Visit  Medication Sig Dispense Refill  . Calcium Carb-Cholecalciferol (CALCIUM + D3 PO) Take 2 tablets by mouth daily.    Marland Kitchen diltiazem (CARDIZEM) 30 MG tablet Take 1 tablet every 4 hours AS NEEDED for AFIB heart rate >100 45 tablet 1  . ELIQUIS 5 MG TABS tablet TAKE 1 TABLET BY MOUTH TWICE A DAY 60 tablet 10  . exemestane (AROMASIN) 25 MG tablet TAKE 1 TABLET EVERY DAY AFTER BREAKFAST 90 tablet 1  . famotidine-calcium carbonate-magnesium hydroxide (PEPCID COMPLETE) 10-800-165 MG chewable tablet Chew 1 tablet by mouth daily as needed (acid reflux).    . metoprolol (LOPRESSOR) 50 MG tablet Take 1 tablet by mouth 2 (two) times daily.    Marland Kitchen MYRBETRIQ 25 MG TB24 tablet Take 1 tablet by mouth  daily.    Marland Kitchen PROAIR HFA 108 (90 Base) MCG/ACT inhaler Inhale 2 puffs into the lungs every 4 (four) hours as needed for shortness of breath.    . simvastatin (ZOCOR) 20 MG tablet Take 20 mg by mouth daily.    Marland Kitchen telmisartan (MICARDIS) 80 MG tablet Take 80 mg by mouth daily.    Marland Kitchen zolpidem (AMBIEN) 5 MG tablet Take 1 tablet upon arrival to the lab for sleep study (Patient not taking: Reported on 06/17/2018) 1 tablet 0   No current facility-administered medications  on file prior to visit.     PAST MEDICAL HISTORY: Past Medical History:  Diagnosis Date  . Adenomatous colon polyp   . Atrial fibrillation ([place])   . Back pain   . Bladder leak   . Breast cancer ([place]) 2015   Right Breast Cancer  . GERD (gastroesophageal reflux disease)   . HTN (hypertension)   . Hyperlipidemia   . Joint pain   . Palpitation   . Personal history of radiation therapy 2015  . Radiation 08/03/13-08/24/13   right breast 42.72 gray  . SOB (shortness of breath)   . Swelling   . Wears glasses     PAST SURGICAL HISTORY: Past Surgical History:  Procedure Laterality Date  . APPENDECTOMY  1956  . BREAST BIOPSY Right 04/22/2013  . BREAST LUMPECTOMY Right 05/29/2013  . BREAST LUMPECTOMY WITH NEEDLE LOCALIZATION AND AXILLARY SENTINEL LYMPH NODE BX Right 05/29/2013   Procedure: BREAST LUMPECTOMY WITH NEEDLE LOCALIZATION AND AXILLARY SENTINEL LYMPH NODE BX;  Surgeon: Edward Jolly, MD;  Location: [facility];  Service: General;  Laterality: Right;  . CARDIAC CATHETERIZATION  2008  . [place]  . KNEE ARTHROSCOPY  2001   right  . RE-EXCISION OF BREAST LUMPECTOMY Right 06/12/2013   Procedure: RE-EXCISION OF BREAST LUMPECTOMY;  Surgeon: Edward Jolly, MD;  Location: [facility];  Service: General;  Laterality: Right;  . TUBAL LIGATION  1976  . UPPER GI ENDOSCOPY      SOCIAL HISTORY: Social History   Tobacco Use  . Smoking status: Never Smoker  . Smokeless tobacco: Never Used  Substance Use Topics  . Alcohol use: No  . Drug use: No    FAMILY HISTORY: Family History  Problem Relation Age of Onset  . Coronary artery disease Father        MI at age 24  . Heart disease Father   . Heart disease Mother   . Hypertension Mother   . Cancer Cousin 47       had lumpectomy and radiation,  25 years ago  . Breast cancer Cousin   . Colon cancer Neg Hx   . Esophageal cancer Neg Hx   . Rectal cancer Neg Hx   .  Stomach cancer Neg Hx    ROS: Review of Systems  Endo/Heme/Allergies:       Negative for polyphagia.   PHYSICAL EXAM: Pt in no acute distress  RECENT LABS AND TESTS: BMET    Component Value Date/Time   NA 134 02/25/2018 0000   NA 138 07/20/2016 1005   K 4.6 02/25/2018 0000   K 4.0 07/20/2016 1005   CL 95 (L) 02/25/2018 0000   CO2 24 02/25/2018 0000   CO2 26 07/20/2016 1005   GLUCOSE 94 02/25/2018 0000   GLUCOSE 102 (H) 01/17/2018 0945   GLUCOSE 108 07/20/2016 1005   BUN 21 02/25/2018 0000   BUN 15.0 07/20/2016 1005  CREATININE 0.93 02/25/2018 0000   CREATININE 1.01 (H) 01/17/2018 0945   CREATININE 1.0 07/20/2016 1005   CALCIUM 9.5 02/25/2018 0000   CALCIUM 10.0 07/20/2016 1005   GFRNONAA 59 (L) 02/25/2018 0000   GFRNONAA 53 (L) 01/17/2018 0945   GFRAA 68 02/25/2018 0000   GFRAA >60 01/17/2018 0945   Lab Results  Component Value Date   HGBA1C 6.0 (H) 02/25/2018   Lab Results  Component Value Date   INSULIN 9.7 02/25/2018   CBC    Component Value Date/Time   WBC 9.5 01/17/2018 0945   WBC 8.4 01/18/2017 1112   RBC 4.83 01/17/2018 0945   HGB 13.8 01/17/2018 0945   HGB 14.2 07/20/2016 1005   HCT 39.9 01/17/2018 0945   HCT 41.6 07/20/2016 1005   PLT 250 01/17/2018 0945   PLT 207 07/20/2016 1005   MCV 82.6 01/17/2018 0945   MCV 83.7 07/20/2016 1005   MCH 28.6 01/17/2018 0945   MCHC 34.6 01/17/2018 0945   RDW 12.7 01/17/2018 0945   RDW 13.4 07/20/2016 1005   LYMPHSABS 1.8 01/17/2018 0945   LYMPHSABS 2.6 07/20/2016 1005   MONOABS 0.7 01/17/2018 0945   MONOABS 0.7 07/20/2016 1005   EOSABS 0.2 01/17/2018 0945   EOSABS 0.3 07/20/2016 1005   BASOSABS 0.0 01/17/2018 0945   BASOSABS 0.1 07/20/2016 1005   Iron/TIBC/Ferritin/ %Sat No results found for: IRON, TIBC, FERRITIN, IRONPCTSAT Lipid Panel     Component Value Date/Time   CHOL 140 02/25/2018 0000   TRIG 69 02/25/2018 0000   HDL 48 02/25/2018 0000   LDLCALC 78 02/25/2018 0000   Hepatic Function  Panel     Component Value Date/Time   PROT 6.6 02/25/2018 0000   PROT 7.0 07/20/2016 1005   ALBUMIN 4.1 02/25/2018 0000   ALBUMIN 3.7 07/20/2016 1005   AST 15 02/25/2018 0000   AST 13 (L) 01/17/2018 0945   AST 17 07/20/2016 1005   ALT 16 02/25/2018 0000   ALT 11 01/17/2018 0945   ALT 14 07/20/2016 1005   ALKPHOS 119 (H) 02/25/2018 0000   ALKPHOS 131 07/20/2016 1005   BILITOT 0.8 02/25/2018 0000   BILITOT 0.6 01/17/2018 0945   BILITOT 0.56 07/20/2016 1005      Component Value Date/Time   TSH 0.555 02/25/2018 0000   TSH 0.48 02/23/2016 1009    Results for MAUDE, HETTICH (MRN [id number]) as of 07/02/2018 12:52  Ref. Range 02/25/2018 00:00  Vitamin D, 25-Hydroxy Latest Ref Range: 30.0 - 100.0 ng/mL 39.5   I, Michaelene Song, am acting as Location manager for [employer], FNP  I have reviewed the above documentation for accuracy and completeness, and I agree with the above.  -  , FNP-C.

## 2018-07-04 DIAGNOSIS — R7303 Prediabetes: Secondary | ICD-10-CM | POA: Insufficient documentation

## 2018-07-10 ENCOUNTER — Encounter (HOSPITAL_COMMUNITY): Payer: Self-pay | Admitting: Nurse Practitioner

## 2018-07-10 ENCOUNTER — Other Ambulatory Visit: Payer: Self-pay

## 2018-07-10 ENCOUNTER — Ambulatory Visit (HOSPITAL_COMMUNITY)
Admission: RE | Admit: 2018-07-10 | Discharge: 2018-07-10 | Disposition: A | Discharge status: Home / Self Care | Payer: Medicare Other | Source: Ambulatory Visit | Attending: Nurse Practitioner | Admitting: Nurse Practitioner

## 2018-07-10 VITALS — BP 126/64 | HR 72 | Ht 62.0 in | Wt 184.0 lb

## 2018-07-10 DIAGNOSIS — I10 Essential (primary) hypertension: Secondary | ICD-10-CM | POA: Diagnosis not present

## 2018-07-10 DIAGNOSIS — I4891 Unspecified atrial fibrillation: Secondary | ICD-10-CM | POA: Diagnosis not present

## 2018-07-10 DIAGNOSIS — E785 Hyperlipidemia, unspecified: Secondary | ICD-10-CM | POA: Diagnosis not present

## 2018-07-10 DIAGNOSIS — Z923 Personal history of irradiation: Secondary | ICD-10-CM | POA: Diagnosis not present

## 2018-07-10 DIAGNOSIS — Z8249 Family history of ischemic heart disease and other diseases of the circulatory system: Secondary | ICD-10-CM | POA: Insufficient documentation

## 2018-07-10 DIAGNOSIS — I2510 Atherosclerotic heart disease of native coronary artery without angina pectoris: Secondary | ICD-10-CM | POA: Insufficient documentation

## 2018-07-10 DIAGNOSIS — Z853 Personal history of malignant neoplasm of breast: Secondary | ICD-10-CM | POA: Insufficient documentation

## 2018-07-10 DIAGNOSIS — Z79899 Other long term (current) drug therapy: Secondary | ICD-10-CM | POA: Diagnosis not present

## 2018-07-10 DIAGNOSIS — I480 Paroxysmal atrial fibrillation: Secondary | ICD-10-CM | POA: Insufficient documentation

## 2018-07-10 DIAGNOSIS — Z86010 Personal history of colonic polyps: Secondary | ICD-10-CM | POA: Insufficient documentation

## 2018-07-10 DIAGNOSIS — Z79811 Long term (current) use of aromatase inhibitors: Secondary | ICD-10-CM | POA: Insufficient documentation

## 2018-07-10 DIAGNOSIS — Z888 Allergy status to other drugs, medicaments and biological substances status: Secondary | ICD-10-CM | POA: Diagnosis not present

## 2018-07-10 DIAGNOSIS — G4733 Obstructive sleep apnea (adult) (pediatric): Secondary | ICD-10-CM | POA: Insufficient documentation

## 2018-07-10 NOTE — Progress Notes (Signed)
Primary Care Physician: [name], MD Referring Physician: Dr. Annia Belt is a 78 y.o. female with a h/o paroxysmal afib, nonobstructive CAD by cath in 7829, mild diastolic dysfunction that is in the afib clinic on the request of Dr. Stanford Breed. Pt usually has 1-2 episodes of afib per month that is over in 45 mins or an hour. The other night, she had an episode that lasted most of the night. Interesting, most of her episodes come on at night. She is pending a CPAP titration trial after her sleep apnea test in February showed OSA.It  has been delayed by covid restrictions.  She is also looking to move to [facility] to be close to her son this summer.  F/u in afib clinic, she has not had any further sustained heart irregularity. She has 30 mg Cardizem to use if needed. She is planning to move to [facility] within the month to be close to her son.  Today, she denies symptoms of palpitations, chest pain, shortness of breath, orthopnea, PND, lower extremity edema, dizziness, presyncope, syncope, or neurologic sequela. The patient is tolerating medications without difficulties and is otherwise without complaint today.   Past Medical History:  Diagnosis Date   Adenomatous colon polyp    Atrial fibrillation ([facility])    Back pain    Bladder leak    Breast cancer ([place]) 2015   Right Breast Cancer   GERD (gastroesophageal reflux disease)    HTN (hypertension)    Hyperlipidemia    Joint pain    Palpitation    Personal history of radiation therapy 2015   Radiation 08/03/13-08/24/13   right breast 42.72 gray   SOB (shortness of breath)    Swelling    Wears glasses    Past Surgical History:  Procedure Laterality Date   APPENDECTOMY  1956   BREAST BIOPSY Right 04/22/2013   BREAST LUMPECTOMY Right 05/29/2013   BREAST LUMPECTOMY WITH NEEDLE LOCALIZATION AND AXILLARY SENTINEL LYMPH NODE BX Right 05/29/2013   Procedure: BREAST LUMPECTOMY WITH NEEDLE LOCALIZATION AND  AXILLARY SENTINEL LYMPH NODE BX;  Surgeon: Edward Jolly, MD;  Location: [facility];  Service: General;  Laterality: Right;   CARDIAC CATHETERIZATION  2008   [place] ARTHROSCOPY  2001   right   RE-EXCISION OF BREAST LUMPECTOMY Right 06/12/2013   Procedure: RE-EXCISION OF BREAST LUMPECTOMY;  Surgeon: Edward Jolly, MD;  Location: [facility];  Service: General;  Laterality: Right;   TUBAL LIGATION  1976   UPPER GI ENDOSCOPY      Current Outpatient Medications  Medication Sig Dispense Refill   Calcium Carb-Cholecalciferol (CALCIUM + D3 PO) Take 2 tablets by mouth daily.     ELIQUIS 5 MG TABS tablet TAKE 1 TABLET BY MOUTH TWICE A DAY 60 tablet 10   exemestane (AROMASIN) 25 MG tablet TAKE 1 TABLET EVERY DAY AFTER BREAKFAST 90 tablet 1   famotidine-calcium carbonate-magnesium hydroxide (PEPCID COMPLETE) 10-800-165 MG chewable tablet Chew 1 tablet by mouth daily as needed (acid reflux).     metoprolol (LOPRESSOR) 50 MG tablet Take 1 tablet by mouth 2 (two) times daily.     MYRBETRIQ 25 MG TB24 tablet Take 1 tablet by mouth daily.     PROAIR HFA 108 (90 Base) MCG/ACT inhaler Inhale 2 puffs into the lungs every 4 (four) hours as needed for shortness of breath.     simvastatin (ZOCOR) 20 MG tablet Take 20 mg  by mouth daily.     telmisartan (MICARDIS) 80 MG tablet Take 80 mg by mouth daily.     diltiazem (CARDIZEM) 30 MG tablet Take 1 tablet every 4 hours AS NEEDED for AFIB heart rate >100 (Patient not taking: Reported on 07/10/2018) 45 tablet 1   No current facility-administered medications for this encounter.     Allergies  Allergen Reactions   Prilosec Otc [Omeprazole Magnesium] Itching    Social History   Socioeconomic History   Marital status: Widowed    Spouse name: Not on file   Number of children: 2   Years of education: Not on file   Highest education level: Not on file  Occupational  History   Occupation: retired  Scientist, product/process development strain: Not on file   Food insecurity    Worry: Not on file    Inability: Not on file   Transportation needs    Medical: Not on file    Non-medical: Not on file  Tobacco Use   Smoking status: Never Smoker   Smokeless tobacco: Never Used  Substance and Sexual Activity   Alcohol use: No   Drug use: No   Sexual activity: Not on file  Lifestyle   Physical activity    Days per week: Not on file    Minutes per session: Not on file   Stress: Not on file  Relationships   Social connections    Talks on phone: Not on file    Gets together: Not on file    Attends religious service: Not on file    Active member of club or organization: Not on file    Attends meetings of clubs or organizations: Not on file    Relationship status: Not on file   Intimate partner violence    Fear of current or ex partner: Not on file    Emotionally abused: Not on file    Physically abused: Not on file    Forced sexual activity: Not on file  Other Topics Concern   Not on file  Social History Narrative   Not on file    Family History  Problem Relation Age of Onset   Coronary artery disease Father        MI at age 67   Heart disease Father    Heart disease Mother    Hypertension Mother    Cancer Cousin 41       had lumpectomy and radiation,  25 years ago   Breast cancer Cousin    Colon cancer Neg Hx    Esophageal cancer Neg Hx    Rectal cancer Neg Hx    Stomach cancer Neg Hx     ROS- All systems are reviewed and negative except as per the HPI above  Physical Exam: Vitals:   07/10/18 1344  Weight: 83.5 kg  Height: 5\' 2"  (1.575 m)   Wt Readings from Last 3 Encounters:  07/10/18 83.5 kg  06/09/18 83.5 kg  05/07/18 85.3 kg    Labs: Lab Results  Component Value Date   NA 134 02/25/2018   K 4.6 02/25/2018   CL 95 (L) 02/25/2018   CO2 24 02/25/2018   GLUCOSE 94 02/25/2018   BUN 21 02/25/2018     CREATININE 0.93 02/25/2018   CALCIUM 9.5 02/25/2018   No results found for: INR Lab Results  Component Value Date   CHOL 140 02/25/2018   HDL 48 02/25/2018   LDLCALC 78 02/25/2018   TRIG 69  02/25/2018     GEN- The patient is well appearing, alert and oriented x 3 today.   Head- normocephalic, atraumatic Eyes-  Sclera clear, conjunctiva pink Ears- hearing intact Oropharynx- clear Neck- supple, no JVP Lymph- no cervical lymphadenopathy Lungs- Clear to ausculation bilaterally, normal work of breathing Heart- Regular rate and rhythm, no murmurs, rubs or gallops, PMI not laterally displaced GI- soft, NT, ND, + BS Extremities- no clubbing, cyanosis, or edema MS- no significant deformity or atrophy Skin- no rash or lesion Psych- euthymic mood, full affect Neuro- strength and sensation are intact  EKG-NSR at 72 bpm, pr int 126 ms, qrs int 82 ms, qtc 420 ms    Assessment and Plan: 1. Paroxysmal afib Pt overall has low afib burden but did have a prolonged episode that happened the other week that concerned her, no further episodes I expect untreated sleep apnea is triggering the spells since that mostly occur at night She is pending cpap titration when it can be rescheduled However now that she is moving this may have to  have f/u when she establishes in [facility] She would like to avoid antiarrythmic's at this point  She is not an ablation candidate as this point   Reviewed how to use 30 mg Cardizem to take as needed  Continue daily BB dose without change for now She has a HR in the 60's at rest so do not want to increase her daily BB dose Denies any alcohol, tobacco use, no excessive caffeine   2.[id number] score is 4 Continue eliquis 5 mg bid  3. HTN Stable  4. OSA Cpap  trial pending but now is on hold as pt is moving to [facility] in August    Ford Peddie C. Sun Wilensky, [facility] [address] [place], [place] [address] [phone]

## 2018-07-17 ENCOUNTER — Ambulatory Visit (INDEPENDENT_AMBULATORY_CARE_PROVIDER_SITE_OTHER): Payer: Medicare Other | Admitting: Bariatrics

## 2018-07-17 ENCOUNTER — Other Ambulatory Visit: Payer: Self-pay

## 2018-07-17 ENCOUNTER — Encounter (INDEPENDENT_AMBULATORY_CARE_PROVIDER_SITE_OTHER): Payer: Self-pay | Admitting: Bariatrics

## 2018-07-17 VITALS — BP 121/70 | HR 59 | Temp 98.2°F | Ht 62.0 in | Wt 177.0 lb

## 2018-07-17 DIAGNOSIS — E559 Vitamin D deficiency, unspecified: Secondary | ICD-10-CM

## 2018-07-17 DIAGNOSIS — I10 Essential (primary) hypertension: Secondary | ICD-10-CM

## 2018-07-17 DIAGNOSIS — E669 Obesity, unspecified: Secondary | ICD-10-CM

## 2018-07-17 DIAGNOSIS — Z6832 Body mass index (BMI) 32.0-32.9, adult: Secondary | ICD-10-CM | POA: Diagnosis not present

## 2018-07-17 DIAGNOSIS — R7303 Prediabetes: Secondary | ICD-10-CM

## 2018-07-17 MED ORDER — VITAMIN D (ERGOCALCIFEROL) 1.25 MG (50000 UNIT) PO CAPS: 50000.0000 [IU] | ORAL_CAPSULE | ORAL | 0 refills | Status: DC

## 2018-07-21 NOTE — Progress Notes (Signed)
Office: [phone]  /  Fax: [phone]   HPI:   Chief Complaint: OBESITY Gabriella Walker is here to discuss her progress with her obesity treatment plan. She is on the Category 1 plan and is following her eating plan approximately 99 % of the time. She states she is exercising 0 minutes 0 times per week. Gabriella Walker is down 13 pounds and she is doing well overall. Gabriella Walker is drinking adequate water. Her goal is 150 pounds. Her weight is 177 lb (80.3 kg) today and has had a weight loss of 13 pounds since her last in-office visit. She has lost 13 lbs since starting treatment with [place].  Pre-Diabetes Gabriella Walker has a diagnosis of prediabetes based on her elevated Hgb A1c and was informed this puts her at greater risk of developing diabetes. Her last A1c was at 6.0 and last insulin level was at 9.7 Gabriella Walker is not taking medications currently and she continues to work on diet and exercise to decrease risk of diabetes. She denies nausea or hypoglycemia.  Hypertension Gabriella Walker Gabriella Walker is a 78 y.o. female with hypertension. Gabriella Walker denies lightheadedness. She is working weight loss to help control her blood pressure with the goal of decreasing her risk of heart attack and stroke. Gabriella Walker blood pressure is well controlled.  Vitamin D deficiency Gabriella Walker has a diagnosis of vitamin D deficiency. Her last vitamin D level was at 39.5 She is currently taking vit D and denies nausea, vomiting or muscle weakness.  ASSESSMENT AND PLAN:  Prediabetes - Plan: Comprehensive metabolic panel, Hemoglobin A1c, Insulin, random, Lipid Panel With LDL/HDL Ratio  Essential hypertension  Vitamin D deficiency - Plan: VITAMIN D 25 Hydroxy (Vit-D Deficiency, Fractures), Vitamin D, Ergocalciferol, (DRISDOL) 1.25 MG (50000 UT) CAPS capsule  Class 1 obesity with serious comorbidity and body mass index (BMI) of 32.0 to 32.9 in adult, unspecified obesity type  PLAN:  Pre-Diabetes [name] will continue to work on weight loss, increasing  activity, increasing lean protein and decreasing simple carbohydrates in her diet to help decrease the risk of diabetes. She was informed that eating too many simple carbohydrates or too many calories at one sitting increases the likelihood of GI side effects. [name] agreed to follow up with [place] as directed to monitor her progress.  Hypertension We discussed sodium restriction, working on healthy weight loss, and a regular exercise program as the means to achieve improved blood pressure control. [name] agreed with this plan and agreed to follow up as directed. We will continue to monitor her blood pressure as well as her progress with the above lifestyle modifications. She will continue her medications as prescribed and will watch for signs of hypotension as she continues her lifestyle modifications.  Vitamin D Deficiency Gabriella Walker was informed that low vitamin D levels contributes to fatigue and are associated with obesity, breast, and colon cancer. She agrees to take prescription Vit D @50 ,000 IU every week #4 with no refills and she will follow up for routine testing of vitamin D, at least 2-3 times per year. She was informed of the risk of over-replacement of vitamin D and agrees to not increase her dose unless she discusses this with [place] first. Gabriella Walker agrees to follow up as directed.  Obesity Gabriella Walker is currently in the action stage of change. As such, her goal is to continue with weight loss efforts She has agreed to follow the Category 1 plan Gabriella Walker has been instructed to work up to a goal of 150 minutes of combined cardio and  strengthening exercise per week for weight loss and overall health benefits. We discussed the following Behavioral Modification Strategies today: increase H2O intake, no skipping meals, keeping healthy foods in the home, increasing lean protein intake, decreasing simple carbohydrates, increasing vegetables, decrease eating out and work on meal planning and intentional eating Gabriella Walker  will increase protein and she will weight her meat.  Gabriella Walker has agreed to follow up with our clinic in 2 weeks fasting. She was informed of the importance of frequent follow up visits to maximize her success with intensive lifestyle modifications for her multiple health conditions.  ALLERGIES: Allergies  Allergen Reactions  . Prilosec Otc [Omeprazole Magnesium] Itching    MEDICATIONS: Current Outpatient Medications on File Prior to Visit  Medication Sig Dispense Refill  . Calcium Carb-Cholecalciferol (CALCIUM + D3 PO) Take 2 tablets by mouth daily.    Marland Kitchen diltiazem (CARDIZEM) 30 MG tablet Take 1 tablet every 4 hours AS NEEDED for AFIB heart rate >100 45 tablet 1  . ELIQUIS 5 MG TABS tablet TAKE 1 TABLET BY MOUTH TWICE A DAY 60 tablet 10  . exemestane (AROMASIN) 25 MG tablet TAKE 1 TABLET EVERY DAY AFTER BREAKFAST 90 tablet 1  . famotidine-calcium carbonate-magnesium hydroxide (PEPCID COMPLETE) 10-800-165 MG chewable tablet Chew 1 tablet by mouth daily as needed (acid reflux).    . metoprolol (LOPRESSOR) 50 MG tablet Take 1 tablet by mouth 2 (two) times daily.    Marland Kitchen MYRBETRIQ 25 MG TB24 tablet Take 1 tablet by mouth daily.    Marland Kitchen PROAIR HFA 108 (90 Base) MCG/ACT inhaler Inhale 2 puffs into the lungs every 4 (four) hours as needed for shortness of breath.    . simvastatin (ZOCOR) 20 MG tablet Take 20 mg by mouth daily.    Marland Kitchen telmisartan (MICARDIS) 80 MG tablet Take 80 mg by mouth daily.     No current facility-administered medications on file prior to visit.     PAST MEDICAL HISTORY: Past Medical History:  Diagnosis Date  . Adenomatous colon polyp   . Atrial fibrillation ([place] [facility])   . Back pain   . Bladder leak   . Breast cancer ([place]) 2015   Right Breast Cancer  . GERD (gastroesophageal reflux disease)   . HTN (hypertension)   . Hyperlipidemia   . Joint pain   . Palpitation   . Personal history of radiation therapy 2015  . Radiation 08/03/13-08/24/13   right breast 42.72 gray  . SOB  (shortness of breath)   . Swelling   . Wears glasses     PAST SURGICAL HISTORY: Past Surgical History:  Procedure Laterality Date  . APPENDECTOMY  1956  . BREAST BIOPSY Right 04/22/2013  . BREAST LUMPECTOMY Right 05/29/2013  . BREAST LUMPECTOMY WITH NEEDLE LOCALIZATION AND AXILLARY SENTINEL LYMPH NODE BX Right 05/29/2013   Procedure: BREAST LUMPECTOMY WITH NEEDLE LOCALIZATION AND AXILLARY SENTINEL LYMPH NODE BX;  Surgeon: Edward Jolly, MD;  Location: [facility];  Service: General;  Laterality: Right;  . CARDIAC CATHETERIZATION  2008  . [place]  . KNEE ARTHROSCOPY  2001   right  . RE-EXCISION OF BREAST LUMPECTOMY Right 06/12/2013   Procedure: RE-EXCISION OF BREAST LUMPECTOMY;  Surgeon: Edward Jolly, MD;  Location: [facility];  Service: General;  Laterality: Right;  . TUBAL LIGATION  1976  . UPPER GI ENDOSCOPY      SOCIAL HISTORY: Social History   Tobacco Use  . Smoking status: Never Smoker  . Smokeless tobacco:  Never Used  Substance Use Topics  . Alcohol use: No  . Drug use: No    FAMILY HISTORY: Family History  Problem Relation Age of Onset  . Coronary artery disease Father        MI at age 27  . Heart disease Father   . Heart disease Mother   . Hypertension Mother   . Cancer Cousin 47       had lumpectomy and radiation,  25 years ago  . Breast cancer Cousin   . Colon cancer Neg Hx   . Esophageal cancer Neg Hx   . Rectal cancer Neg Hx   . Stomach cancer Neg Hx     ROS: Review of Systems  Constitutional: Positive for weight loss.  Gastrointestinal: Negative for nausea and vomiting.  Musculoskeletal:       Negative for muscle weakness  Neurological:       Negative for lightheadedness  Endo/Heme/Allergies:       Negative for hypoglycemia    PHYSICAL EXAM: Blood pressure 121/70, pulse (!) 59, temperature 98.2 F (36.8 C), temperature source Oral, height 5\' 2"  (1.575 m), weight 177 lb (80.3  kg), SpO2 98 %. Body mass index is 32.37 kg/m. Physical Exam Vitals signs reviewed.  Constitutional:      Appearance: Normal appearance. She is well-developed. She is obese.  Cardiovascular:     Rate and Rhythm: Normal rate.  Pulmonary:     Effort: Pulmonary effort is normal.  Musculoskeletal: Normal range of motion.  Skin:    General: Skin is warm and dry.  Neurological:     Mental Status: She is alert and oriented to person, place, and time.  Psychiatric:        Mood and Affect: Mood normal.        Behavior: Behavior normal.     RECENT LABS AND TESTS: BMET    Component Value Date/Time   NA 134 02/25/2018 0000   NA 138 07/20/2016 1005   K 4.6 02/25/2018 0000   K 4.0 07/20/2016 1005   CL 95 (L) 02/25/2018 0000   CO2 24 02/25/2018 0000   CO2 26 07/20/2016 1005   GLUCOSE 94 02/25/2018 0000   GLUCOSE 102 (H) 01/17/2018 0945   GLUCOSE 108 07/20/2016 1005   BUN 21 02/25/2018 0000   BUN 15.0 07/20/2016 1005   CREATININE 0.93 02/25/2018 0000   CREATININE 1.01 (H) 01/17/2018 0945   CREATININE 1.0 07/20/2016 1005   CALCIUM 9.5 02/25/2018 0000   CALCIUM 10.0 07/20/2016 1005   GFRNONAA 59 (L) 02/25/2018 0000   GFRNONAA 53 (L) 01/17/2018 0945   GFRAA 68 02/25/2018 0000   GFRAA >60 01/17/2018 0945   Lab Results  Component Value Date   HGBA1C 6.0 (H) 02/25/2018   Lab Results  Component Value Date   INSULIN 9.7 02/25/2018   CBC    Component Value Date/Time   WBC 9.5 01/17/2018 0945   WBC 8.4 01/18/2017 1112   RBC 4.83 01/17/2018 0945   HGB 13.8 01/17/2018 0945   HGB 14.2 07/20/2016 1005   HCT 39.9 01/17/2018 0945   HCT 41.6 07/20/2016 1005   PLT 250 01/17/2018 0945   PLT 207 07/20/2016 1005   MCV 82.6 01/17/2018 0945   MCV 83.7 07/20/2016 1005   MCH 28.6 01/17/2018 0945   MCHC 34.6 01/17/2018 0945   RDW 12.7 01/17/2018 0945   RDW 13.4 07/20/2016 1005   LYMPHSABS 1.8 01/17/2018 0945   LYMPHSABS 2.6 07/20/2016 1005   MONOABS 0.7 01/17/2018  0945   MONOABS  0.7 07/20/2016 1005   EOSABS 0.2 01/17/2018 0945   EOSABS 0.3 07/20/2016 1005   BASOSABS 0.0 01/17/2018 0945   BASOSABS 0.1 07/20/2016 1005   Iron/TIBC/Ferritin/ %Sat No results found for: IRON, TIBC, FERRITIN, IRONPCTSAT Lipid Panel     Component Value Date/Time   CHOL 140 02/25/2018 0000   TRIG 69 02/25/2018 0000   HDL 48 02/25/2018 0000   LDLCALC 78 02/25/2018 0000   Hepatic Function Panel     Component Value Date/Time   PROT 6.6 02/25/2018 0000   PROT 7.0 07/20/2016 1005   ALBUMIN 4.1 02/25/2018 0000   ALBUMIN 3.7 07/20/2016 1005   AST 15 02/25/2018 0000   AST 13 (L) 01/17/2018 0945   AST 17 07/20/2016 1005   ALT 16 02/25/2018 0000   ALT 11 01/17/2018 0945   ALT 14 07/20/2016 1005   ALKPHOS 119 (H) 02/25/2018 0000   ALKPHOS 131 07/20/2016 1005   BILITOT 0.8 02/25/2018 0000   BILITOT 0.6 01/17/2018 0945   BILITOT 0.56 07/20/2016 1005      Component Value Date/Time   TSH 0.555 02/25/2018 0000   TSH 0.48 02/23/2016 1009     Ref. Range 02/25/2018 00:00  Vitamin D, 25-Hydroxy Latest Ref Range: 30.0 - 100.0 ng/mL 39.5    OBESITY BEHAVIORAL INTERVENTION VISIT  Today's visit was # 5   Starting weight: 190 lbs Starting date: 03/11/2018 Today's weight : 177 lbs Today's date: 07/17/2018 Total lbs lost to date: 13    07/17/2018  Height 5\' 2"  (1.575 m)  Weight 177 lb (80.3 kg)  BMI (Calculated) 32.37  BLOOD PRESSURE - SYSTOLIC 923  BLOOD PRESSURE - DIASTOLIC 70   Body Fat % 30.0 %    ASK: We discussed the diagnosis of obesity with Charletta Cousin today and Tinika agreed to give [place] permission to discuss obesity behavioral modification therapy today.  ASSESS: Halleigh has the diagnosis of obesity and her BMI today is 32.37 Ellice is in the action stage of change   ADVISE: Allesandra was educated on the multiple health risks of obesity as well as the benefit of weight loss to improve her health. She was advised of the need for long term treatment and the importance of  lifestyle modifications to improve her current health and to decrease her risk of future health problems.  AGREE: Multiple dietary modification options and treatment options were discussed and  Terrance agreed to follow the recommendations documented in the above note.  ARRANGE: Macon was educated on the importance of frequent visits to treat obesity as outlined per CMS and USPSTF guidelines and agreed to schedule her next follow up appointment today.  Corey Skains, am acting as Location manager for [employer]. Owens Shark, DO  I have reviewed the above documentation for accuracy and completeness, and I agree with the above. -Jearld Lesch, DO

## 2018-07-22 ENCOUNTER — Inpatient Hospital Stay (HOSPITAL_BASED_OUTPATIENT_CLINIC_OR_DEPARTMENT_OTHER): Payer: Medicare Other | Admitting: Oncology

## 2018-07-22 ENCOUNTER — Other Ambulatory Visit: Payer: Self-pay

## 2018-07-22 ENCOUNTER — Inpatient Hospital Stay: Payer: Medicare Other | Attending: Oncology

## 2018-07-22 VITALS — BP 152/64 | HR 60 | Temp 98.9°F | Resp 16 | Ht 62.0 in | Wt 182.5 lb

## 2018-07-22 DIAGNOSIS — Z79899 Other long term (current) drug therapy: Secondary | ICD-10-CM | POA: Diagnosis not present

## 2018-07-22 DIAGNOSIS — M25562 Pain in left knee: Secondary | ICD-10-CM | POA: Insufficient documentation

## 2018-07-22 DIAGNOSIS — Z79811 Long term (current) use of aromatase inhibitors: Secondary | ICD-10-CM | POA: Diagnosis not present

## 2018-07-22 DIAGNOSIS — Z170 Estrogen receptor positive status [ER+]: Secondary | ICD-10-CM | POA: Insufficient documentation

## 2018-07-22 DIAGNOSIS — M25561 Pain in right knee: Secondary | ICD-10-CM | POA: Insufficient documentation

## 2018-07-22 DIAGNOSIS — M810 Age-related osteoporosis without current pathological fracture: Secondary | ICD-10-CM | POA: Diagnosis not present

## 2018-07-22 DIAGNOSIS — C50911 Malignant neoplasm of unspecified site of right female breast: Secondary | ICD-10-CM

## 2018-07-22 DIAGNOSIS — Z7901 Long term (current) use of anticoagulants: Secondary | ICD-10-CM | POA: Diagnosis not present

## 2018-07-22 LAB — CBC WITH DIFFERENTIAL (CANCER CENTER ONLY)
Abs Immature Granulocytes: 0.01 10*3/uL (ref 0.00–0.07)
Basophils Absolute: 0 10*3/uL (ref 0.0–0.1)
Basophils Relative: 1 %
Eosinophils Absolute: 0.3 10*3/uL (ref 0.0–0.5)
Eosinophils Relative: 3 %
HCT: 41.6 % (ref 36.0–46.0)
Hemoglobin: 13.6 g/dL (ref 12.0–15.0)
Immature Granulocytes: 0 %
Lymphocytes Relative: 24 %
Lymphs Abs: 1.9 10*3/uL (ref 0.7–4.0)
MCH: 28.2 pg (ref 26.0–34.0)
MCHC: 32.7 g/dL (ref 30.0–36.0)
MCV: 86.3 fL (ref 80.0–100.0)
Monocytes Absolute: 0.5 10*3/uL (ref 0.1–1.0)
Monocytes Relative: 6 %
Neutro Abs: 5.2 10*3/uL (ref 1.7–7.7)
Neutrophils Relative %: 66 %
Platelet Count: 215 10*3/uL (ref 150–400)
RBC: 4.82 MIL/uL (ref 3.87–5.11)
RDW: 12.8 % (ref 11.5–15.5)
WBC Count: 7.8 10*3/uL (ref 4.0–10.5)
nRBC: 0 % (ref 0.0–0.2)

## 2018-07-22 LAB — CMP (CANCER CENTER ONLY)
ALT: 9 U/L (ref 0–44)
AST: 15 U/L (ref 15–41)
Albumin: 3.7 g/dL (ref 3.5–5.0)
Alkaline Phosphatase: 106 U/L (ref 38–126)
Anion gap: 10 (ref 5–15)
BUN: 11 mg/dL (ref 8–23)
CO2: 25 mmol/L (ref 22–32)
Calcium: 9.2 mg/dL (ref 8.9–10.3)
Chloride: 103 mmol/L (ref 98–111)
Creatinine: 0.91 mg/dL (ref 0.44–1.00)
GFR, Est AFR Am: >60 mL/min (ref >60)
GFR, Estimated: >60 mL/min (ref >60)
Glucose, Bld: 139 mg/dL — ABNORMAL HIGH (ref 70–99)
Potassium: 4 mmol/L (ref 3.5–5.1)
Sodium: 138 mmol/L (ref 135–145)
Total Bilirubin: 0.5 mg/dL (ref 0.3–1.2)
Total Protein: 6.5 g/dL (ref 6.5–8.1)

## 2018-07-22 NOTE — Progress Notes (Signed)
Hematology and Oncology Follow Up Visit  Gabriella Walker [id number] Feb 07, 1940 78 y.o. 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   Prior Therapy:   She is S/P right lumpectomy on 05/29/2013 followed by radiation therapy under the care of Dr. Sondra Come completed in August of 2015.  Current therapy:  Arimidex 1 mg daily started in September 2015. This was switched to Aromasin in October 2016 for better tolerance related to arthralgias.  Interim History: Ms. Yaun returns today for a repeat evaluation.  Since her last visit, he reports no major changes in her health.  She continues to have issues with bilateral knee pain without any recent falls or syncope.  She continues to live independently and able to drive without any difficulties.  She is planning to move to [place] to be close to her son.  She denies any complications related to Aromasin.  She denied any alteration mental status, neuropathy, confusion or dizziness.  Denies any headaches or lethargy.  Denies any night sweats, weight loss or changes in appetite.  Denied orthopnea, dyspnea on exertion or chest discomfort.  Denies shortness of breath, difficulty breathing hemoptysis or cough.  Denies any abdominal distention, nausea, early satiety or dyspepsia.  Denies any hematuria, frequency, dysuria or nocturia.  Denies any skin irritation, dryness or rash.  Denies any ecchymosis or petechiae.  Denies any lymphadenopathy or clotting.  Denies any heat or cold intolerance.  Denies any anxiety or depression.  Remaining review of system is negative.      Medications: No medication changes noted today. Current Outpatient Medications  Medication Sig Dispense Refill  . Calcium Carb-Cholecalciferol (CALCIUM + D3 PO) Take 2 tablets by mouth daily.    Marland Kitchen diltiazem  (CARDIZEM) 30 MG tablet Take 1 tablet every 4 hours AS NEEDED for AFIB heart rate >100 45 tablet 1  . ELIQUIS 5 MG TABS tablet TAKE 1 TABLET BY MOUTH TWICE A DAY 60 tablet 10  . exemestane (AROMASIN) 25 MG tablet TAKE 1 TABLET EVERY DAY AFTER BREAKFAST 90 tablet 1  . famotidine-calcium carbonate-magnesium hydroxide (PEPCID COMPLETE) 10-800-165 MG chewable tablet Chew 1 tablet by mouth daily as needed (acid reflux).    . metoprolol (LOPRESSOR) 50 MG tablet Take 1 tablet by mouth 2 (two) times daily.    Marland Kitchen MYRBETRIQ 25 MG TB24 tablet Take 1 tablet by mouth daily.    Marland Kitchen PROAIR HFA 108 (90 Base) MCG/ACT inhaler Inhale 2 puffs into the lungs every 4 (four) hours as needed for shortness of breath.    . simvastatin (ZOCOR) 20 MG tablet Take 20 mg by mouth daily.    Marland Kitchen telmisartan (MICARDIS) 80 MG tablet Take 80 mg by mouth daily.    . Vitamin D, Ergocalciferol, (DRISDOL) 1.25 MG (50000 UT) CAPS capsule Take 1 capsule (50,000 Units total) by mouth every 7 (seven) days. 4 capsule 0   No current facility-administered medications for this visit.      Allergies:  Allergies  Allergen Reactions  . Prilosec Otc [Omeprazole Magnesium] Itching    Past Medical History, Surgical history, Social history, and Family History are unchanged by review.  Physical Exam: Blood pressure (!) 152/64, pulse 60, temperature 98.9 F (37.2 C), temperature source Oral, resp. rate 16, height '5\' 2"'  (1.575 m), weight 182 lb 8 oz (82.8 kg), SpO2 100 %.   ECOG: 1  General appearance: Alert, awake without any distress. Head: Atraumatic without abnormalities Oropharynx: Without any thrush or ulcers. Eyes: No scleral icterus. Lymph nodes: No lymphadenopathy noted in the cervical, supraclavicular, or axillary nodes Heart:regular rate and rhythm, without any murmurs or gallops.   Lung: Clear to auscultation without any rhonchi, wheezes or dullness to percussion. Abdomin: Soft, nontender without any shifting dullness or  ascites. Musculoskeletal: No clubbing or cyanosis. Neurological: No motor or sensory deficits. Skin: No rashes or lesions.     Lab Results: Lab Results  Component Value Date   WBC 7.8 07/22/2018   HGB 13.6 07/22/2018   HCT 41.6 07/22/2018   MCV 86.3 07/22/2018   PLT 215 07/22/2018     Chemistry      Component Value Date/Time   NA 134 02/25/2018 0000   NA 138 07/20/2016 1005   K 4.6 02/25/2018 0000   K 4.0 07/20/2016 1005   CL 95 (L) 02/25/2018 0000   CO2 24 02/25/2018 0000   CO2 26 07/20/2016 1005   BUN 21 02/25/2018 0000   BUN 15.0 07/20/2016 1005   CREATININE 0.93 02/25/2018 0000   CREATININE 1.01 (H) 01/17/2018 0945   CREATININE 1.0 07/20/2016 1005      Component Value Date/Time   CALCIUM 9.5 02/25/2018 0000   CALCIUM 10.0 07/20/2016 1005   ALKPHOS 119 (H) 02/25/2018 0000   ALKPHOS 131 07/20/2016 1005   AST 15 02/25/2018 0000   AST 13 (L) 01/17/2018 0945   AST 17 07/20/2016 1005   ALT 16 02/25/2018 0000   ALT 11 01/17/2018 0945   ALT 14 07/20/2016 1005   BILITOT 0.8 02/25/2018 0000   BILITOT 0.6 01/17/2018 0945   BILITOT 0.56 07/20/2016 1005      I   Impression and Plan:   78 year old woman with:  1.  Stage I breast cancer diagnosed 2015.  She was found to have T1cN0 disease that is ER and PR positive.   The natural course of this disease was reviewed and treatment options in the future were outlined.  She has completed 5 years of adjuvant hormonal therapy without any evidence of relapse.  Risks and benefits of continuing aromatase inhibitor was discussed.  And at this time she opted to discontinue.  Given her low risk of disease and risk of osteoporosis it would be reasonable to discontinue adjuvant hormonal therapy.  She is planning to move to [place] and I urged her to establish care with primary care physician over there as well as continue annual mammography.  Oncology follow-up will be as needed.   2. Osteoporosis: She remains on  calcium and vitamin D supplements which I recommended continuing.  3. Followup: I am happy to see her in the future as needed.  15 minutes was spent with the patient face-to-face today.  More than 50% of time was spent on reviewing her disease status, risk of relapse and answering questions regarding future plan of care.          Zola Button, MD 7/21/20201:28 PM

## 2018-07-28 DIAGNOSIS — I10 Essential (primary) hypertension: Secondary | ICD-10-CM | POA: Diagnosis not present

## 2018-07-28 DIAGNOSIS — R7303 Prediabetes: Secondary | ICD-10-CM | POA: Diagnosis not present

## 2018-07-28 DIAGNOSIS — E559 Vitamin D deficiency, unspecified: Secondary | ICD-10-CM | POA: Diagnosis not present

## 2018-07-28 DIAGNOSIS — R0602 Shortness of breath: Secondary | ICD-10-CM | POA: Diagnosis not present

## 2018-07-29 LAB — COMPREHENSIVE METABOLIC PANEL
ALT: 9 IU/L (ref 0–32)
AST: 15 IU/L (ref 0–40)
Albumin/Globulin Ratio: 2 (ref 1.2–2.2)
Albumin: 4.4 g/dL (ref 3.7–4.7)
Alkaline Phosphatase: 121 IU/L — ABNORMAL HIGH (ref 39–117)
BUN/Creatinine Ratio: 17 (ref 12–28)
BUN: 14 mg/dL (ref 8–27)
Bilirubin Total: 0.6 mg/dL (ref 0.0–1.2)
CO2: 23 mmol/L (ref 20–29)
Calcium: 9.5 mg/dL (ref 8.7–10.3)
Chloride: 91 mmol/L — ABNORMAL LOW (ref 96–106)
Creatinine, Ser: 0.83 mg/dL (ref 0.57–1.00)
GFR calc Af Amer: 78 mL/min/{1.73_m2} (ref >59)
GFR calc non Af Amer: 67 mL/min/{1.73_m2} (ref >59)
Globulin, Total: 2.2 g/dL (ref 1.5–4.5)
Glucose: 91 mg/dL (ref 65–99)
Potassium: 4.6 mmol/L (ref 3.5–5.2)
Sodium: 133 mmol/L — ABNORMAL LOW (ref 134–144)
Total Protein: 6.6 g/dL (ref 6.0–8.5)

## 2018-07-29 LAB — VITAMIN D 25 HYDROXY (VIT D DEFICIENCY, FRACTURES): Vit D, 25-Hydroxy: 53 ng/mL (ref 30.0–100.0)

## 2018-07-29 LAB — HEMOGLOBIN A1C
Est. average glucose Bld gHb Est-mCnc: 117 mg/dL
Hgb A1c MFr Bld: 5.7 % — ABNORMAL HIGH (ref 4.8–5.6)

## 2018-07-29 LAB — LIPID PANEL WITH LDL/HDL RATIO
Cholesterol, Total: 152 mg/dL (ref 100–199)
HDL: 37 mg/dL — ABNORMAL LOW (ref >39)
LDL Calculated: 97 mg/dL (ref 0–99)
LDl/HDL Ratio: 2.6 ratio (ref 0.0–3.2)
Triglycerides: 89 mg/dL (ref 0–149)
VLDL Cholesterol Cal: 18 mg/dL (ref 5–40)

## 2018-07-29 LAB — INSULIN, RANDOM: INSULIN: 12.7 u[IU]/mL (ref 2.6–24.9)

## 2018-07-30 DIAGNOSIS — C50911 Malignant neoplasm of unspecified site of right female breast: Secondary | ICD-10-CM | POA: Diagnosis not present

## 2018-07-31 ENCOUNTER — Other Ambulatory Visit: Payer: Self-pay

## 2018-07-31 ENCOUNTER — Ambulatory Visit (INDEPENDENT_AMBULATORY_CARE_PROVIDER_SITE_OTHER): Payer: Medicare Other | Admitting: Bariatrics

## 2018-07-31 ENCOUNTER — Encounter (INDEPENDENT_AMBULATORY_CARE_PROVIDER_SITE_OTHER): Payer: Self-pay | Admitting: Bariatrics

## 2018-07-31 DIAGNOSIS — F3289 Other specified depressive episodes: Secondary | ICD-10-CM | POA: Diagnosis not present

## 2018-07-31 DIAGNOSIS — R7303 Prediabetes: Secondary | ICD-10-CM

## 2018-07-31 DIAGNOSIS — Z6835 Body mass index (BMI) 35.0-35.9, adult: Secondary | ICD-10-CM

## 2018-08-03 NOTE — Progress Notes (Signed)
Office: [phone]  /  Fax: [phone] TeleHealth Visit:  Gabriella Walker has verbally consented to this TeleHealth visit today. The patient is located at home, the provider is located at the [employer] [facility] office. The participants in this visit include the listed provider and patient. Gabriella Walker was unable to use realtime audiovisual technology today and the telehealth visit was conducted via telephone.   HPI:   Chief Complaint: OBESITY Gabriella Walker is here to discuss her progress with her obesity treatment plan. She is on the Category 1 plan and is following her eating plan approximately 80 % of the time. She states she is exercising 0 minutes 0 times per week. Gabriella Walker states that she has gained 3 lbs. She states that she has more fatigue. She is drinking more water.  We were unable to weigh the patient today for this TeleHealth visit. She feels as if she has gained 3 lbs since her last visit. She has lost 13 lbs since starting treatment with [place].  Pre-Diabetes Gabriella Walker has a diagnosis of pre-diabetes based on her elevated Hgb A1c and was informed this puts her at greater risk of developing diabetes. She is not on any medications currently and continues to work on diet and exercise to decrease risk of diabetes. She denies nausea or hypoglycemia.  Vitamin D Deficiency Gabriella Walker has a diagnosis of vitamin D deficiency. She is currently taking prescription Vit D and denies nausea, vomiting or muscle weakness.  Depression with Emotional Eating Behaviors Gabriella Walker is struggling with emotional eating and using food for comfort to the extent that it is negatively impacting her health. She often snacks when she is not hungry. Gabriella Walker sometimes feels she is out of control and then feels guilty that she made poor food choices. She has been working on behavior modification techniques to help reduce her emotional eating and has been somewhat successful. She shows no sign of suicidal or homicidal ideations.   Depression screen [facility] 2/9 02/25/2018 08/04/2013  Decreased Interest 3 0  Down, Depressed, Hopeless 3 0  PHQ - 2 Score 6 0  Altered sleeping 1 -  Tired, decreased energy 3 -  Change in appetite 2 -  Feeling bad or failure about yourself  1 -  Trouble concentrating 1 -  Moving slowly or fidgety/restless 3 -  Suicidal thoughts 0 -  PHQ-9 Score 17 -  Difficult doing work/chores Extremely dIfficult -    ASSESSMENT AND PLAN:  Prediabetes  Other depression  Class 2 severe obesity with serious comorbidity and body mass index (BMI) of 35.0 to 35.9 in adult, unspecified obesity type [facility])  PLAN:  Pre-Diabetes Gabriella Walker will continue to work on weight loss, will be more active, increase protein, and decreasing simple carbohydrates in her diet to help decrease the risk of diabetes. We dicussed metformin including benefits and risks. She was informed that eating too many simple carbohydrates or too many calories at one sitting increases the likelihood of GI side effects. Gabriella Walker declined metformin for now and a prescription was not written today. Gabriella Walker agrees to follow up with our clinic in 2 weeks as directed to monitor her progress.  Vitamin D Deficiency Gabriella Walker was informed that low vitamin D levels contributes to fatigue and are associated with obesity, breast, and colon cancer. Venita agrees to continue taking prescription Vit D 50,000 IU every week and will follow up for routine testing of vitamin D, at least 2-3 times per year. She was informed of the risk of over-replacement of vitamin  D and agrees to not increase her dose unless she discusses this with [place] first. Gabriella Walker agrees to follow up with our clinic in 2 weeks.  Depression with Emotional Eating Behaviors We discussed cognitive behavioral techniques today to help Gabriella Walker deal with her emotional eating and depression. Gabriella Walker agrees to follow up with our clinic in 2 weeks.  Obesity Gabriella Walker is currently in the action stage of change. As such, her  goal is to continue with weight loss efforts She has agreed to follow the Category 1 plan Gabriella Walker has been instructed to work up to a goal of 150 minutes of combined cardio and strengthening exercise per week for weight loss and overall health benefits. We discussed the following Behavioral Modification Strategies today: increasing lean protein intake, decreasing simple carbohydrates, increasing vegetables, increase H20 intake, decrease eating out, no skipping meals, work on meal planning and easy cooking plans, keeping healthy foods in the home, and planning for success   Gabriella Walker has agreed to follow up with our clinic in 2 weeks. She was informed of the importance of frequent follow up visits to maximize her success with intensive lifestyle modifications for her multiple health conditions.  ALLERGIES: Allergies  Allergen Reactions  . Prilosec Otc [Omeprazole Magnesium] Itching    MEDICATIONS: Current Outpatient Medications on File Prior to Visit  Medication Sig Dispense Refill  . Calcium Carb-Cholecalciferol (CALCIUM + D3 PO) Take 2 tablets by mouth daily.    Marland Kitchen diltiazem (CARDIZEM) 30 MG tablet Take 1 tablet every 4 hours AS NEEDED for AFIB heart rate >100 45 tablet 1  . ELIQUIS 5 MG TABS tablet TAKE 1 TABLET BY MOUTH TWICE A DAY 60 tablet 10  . exemestane (AROMASIN) 25 MG tablet TAKE 1 TABLET EVERY DAY AFTER BREAKFAST 90 tablet 1  . famotidine-calcium carbonate-magnesium hydroxide (PEPCID COMPLETE) 10-800-165 MG chewable tablet Chew 1 tablet by mouth daily as needed (acid reflux).    . metoprolol (LOPRESSOR) 50 MG tablet Take 1 tablet by mouth 2 (two) times daily.    Marland Kitchen MYRBETRIQ 25 MG TB24 tablet Take 1 tablet by mouth daily.    Marland Kitchen PROAIR HFA 108 (90 Base) MCG/ACT inhaler Inhale 2 puffs into the lungs every 4 (four) hours as needed for shortness of breath.    . simvastatin (ZOCOR) 20 MG tablet Take 20 mg by mouth daily.    Marland Kitchen telmisartan (MICARDIS) 80 MG tablet Take 80 mg by mouth daily.    .  Vitamin D, Ergocalciferol, (DRISDOL) 1.25 MG (50000 UT) CAPS capsule Take 1 capsule (50,000 Units total) by mouth every 7 (seven) days. 4 capsule 0   No current facility-administered medications on file prior to visit.     PAST MEDICAL HISTORY: Past Medical History:  Diagnosis Date  . Adenomatous colon polyp   . Atrial fibrillation ([place])   . Back pain   . Bladder leak   . Breast cancer ([place]) 2015   Right Breast Cancer  . GERD (gastroesophageal reflux disease)   . HTN (hypertension)   . Hyperlipidemia   . Joint pain   . Palpitation   . Personal history of radiation therapy 2015  . Radiation 08/03/13-08/24/13   right breast 42.72 gray  . SOB (shortness of breath)   . Swelling   . Wears glasses     PAST SURGICAL HISTORY: Past Surgical History:  Procedure Laterality Date  . APPENDECTOMY  1956  . BREAST BIOPSY Right 04/22/2013  . BREAST LUMPECTOMY Right 05/29/2013  . BREAST LUMPECTOMY WITH NEEDLE LOCALIZATION AND  AXILLARY SENTINEL LYMPH NODE BX Right 05/29/2013   Procedure: BREAST LUMPECTOMY WITH NEEDLE LOCALIZATION AND AXILLARY SENTINEL LYMPH NODE BX;  Surgeon: Edward Jolly, MD;  Location: [place];  Service: General;  Laterality: Right;  . CARDIAC CATHETERIZATION  2008  . [place]  . KNEE ARTHROSCOPY  2001   right  . RE-EXCISION OF BREAST LUMPECTOMY Right 06/12/2013   Procedure: RE-EXCISION OF BREAST LUMPECTOMY;  Surgeon: Edward Jolly, MD;  Location: [place];  Service: General;  Laterality: Right;  . TUBAL LIGATION  1976  . UPPER GI ENDOSCOPY      SOCIAL HISTORY: Social History   Tobacco Use  . Smoking status: Never Smoker  . Smokeless tobacco: Never Used  Substance Use Topics  . Alcohol use: No  . Drug use: No    FAMILY HISTORY: Family History  Problem Relation Age of Onset  . Coronary artery disease Father        MI at age 86  . Heart disease Father   . Heart disease Mother   .  Hypertension Mother   . Cancer Cousin 47       had lumpectomy and radiation,  25 years ago  . Breast cancer Cousin   . Colon cancer Neg Hx   . Esophageal cancer Neg Hx   . Rectal cancer Neg Hx   . Stomach cancer Neg Hx     ROS: Review of Systems  Constitutional: Negative for weight loss.  Gastrointestinal: Negative for nausea and vomiting.  Musculoskeletal:       Negative muscle weakness  Endo/Heme/Allergies:       Negative hypoglycemia  Psychiatric/Behavioral: Positive for depression. Negative for suicidal ideas.    PHYSICAL EXAM: Pt in no acute distress  RECENT LABS AND TESTS: BMET    Component Value Date/Time   NA 133 (L) 07/28/2018 0920   NA 138 07/20/2016 1005   K 4.6 07/28/2018 0920   K 4.0 07/20/2016 1005   CL 91 (L) 07/28/2018 0920   CO2 23 07/28/2018 0920   CO2 26 07/20/2016 1005   GLUCOSE 91 07/28/2018 0920   GLUCOSE 139 (H) 07/22/2018 1250   GLUCOSE 108 07/20/2016 1005   BUN 14 07/28/2018 0920   BUN 15.0 07/20/2016 1005   CREATININE 0.83 07/28/2018 0920   CREATININE 0.91 07/22/2018 1250   CREATININE 1.0 07/20/2016 1005   CALCIUM 9.5 07/28/2018 0920   CALCIUM 10.0 07/20/2016 1005   GFRNONAA 67 07/28/2018 0920   GFRNONAA >60 07/22/2018 1250   GFRAA 78 07/28/2018 0920   GFRAA >60 07/22/2018 1250   Lab Results  Component Value Date   HGBA1C 5.7 (H) 07/28/2018   HGBA1C 6.0 (H) 02/25/2018   Lab Results  Component Value Date   INSULIN 12.7 07/28/2018   INSULIN 9.7 02/25/2018   CBC    Component Value Date/Time   WBC 7.8 07/22/2018 1250   WBC 8.4 01/18/2017 1112   RBC 4.82 07/22/2018 1250   HGB 13.6 07/22/2018 1250   HGB 14.2 07/20/2016 1005   HCT 41.6 07/22/2018 1250   HCT 41.6 07/20/2016 1005   PLT 215 07/22/2018 1250   PLT 207 07/20/2016 1005   MCV 86.3 07/22/2018 1250   MCV 83.7 07/20/2016 1005   MCH 28.2 07/22/2018 1250   MCHC 32.7 07/22/2018 1250   RDW 12.8 07/22/2018 1250   RDW 13.4 07/20/2016 1005   LYMPHSABS 1.9 07/22/2018 1250    LYMPHSABS 2.6 07/20/2016 1005   MONOABS 0.5  07/22/2018 1250   MONOABS 0.7 07/20/2016 1005   EOSABS 0.3 07/22/2018 1250   EOSABS 0.3 07/20/2016 1005   BASOSABS 0.0 07/22/2018 1250   BASOSABS 0.1 07/20/2016 1005   Iron/TIBC/Ferritin/ %Sat No results found for: IRON, TIBC, FERRITIN, IRONPCTSAT Lipid Panel     Component Value Date/Time   CHOL 152 07/28/2018 0920   TRIG 89 07/28/2018 0920   HDL 37 (L) 07/28/2018 0920   LDLCALC 97 07/28/2018 0920   Hepatic Function Panel     Component Value Date/Time   PROT 6.6 07/28/2018 0920   PROT 7.0 07/20/2016 1005   ALBUMIN 4.4 07/28/2018 0920   ALBUMIN 3.7 07/20/2016 1005   AST 15 07/28/2018 0920   AST 15 07/22/2018 1250   AST 17 07/20/2016 1005   ALT 9 07/28/2018 0920   ALT 9 07/22/2018 1250   ALT 14 07/20/2016 1005   ALKPHOS 121 (H) 07/28/2018 0920   ALKPHOS 131 07/20/2016 1005   BILITOT 0.6 07/28/2018 0920   BILITOT 0.5 07/22/2018 1250   BILITOT 0.56 07/20/2016 1005      Component Value Date/Time   TSH 0.555 02/25/2018 0000   TSH 0.48 02/23/2016 1009      I, Trixie Dredge, am acting as Location manager for [employer], DO  I have reviewed the above documentation for accuracy and completeness, and I agree with the above. Jearld Lesch, DO

## 2018-08-04 ENCOUNTER — Encounter (INDEPENDENT_AMBULATORY_CARE_PROVIDER_SITE_OTHER): Payer: Self-pay | Admitting: Bariatrics

## 2018-08-05 ENCOUNTER — Other Ambulatory Visit (INDEPENDENT_AMBULATORY_CARE_PROVIDER_SITE_OTHER): Payer: Self-pay | Admitting: Bariatrics

## 2018-08-05 ENCOUNTER — Other Ambulatory Visit: Payer: Medicare Other

## 2018-08-05 ENCOUNTER — Ambulatory Visit: Payer: Medicare Other | Admitting: Oncology

## 2018-08-05 DIAGNOSIS — E559 Vitamin D deficiency, unspecified: Secondary | ICD-10-CM

## 2018-08-19 ENCOUNTER — Telehealth (INDEPENDENT_AMBULATORY_CARE_PROVIDER_SITE_OTHER): Payer: Medicare Other | Admitting: Bariatrics

## 2018-08-19 ENCOUNTER — Other Ambulatory Visit: Payer: Self-pay

## 2018-08-19 ENCOUNTER — Encounter (INDEPENDENT_AMBULATORY_CARE_PROVIDER_SITE_OTHER): Payer: Self-pay | Admitting: Bariatrics

## 2018-08-19 DIAGNOSIS — E559 Vitamin D deficiency, unspecified: Secondary | ICD-10-CM | POA: Diagnosis not present

## 2018-08-19 DIAGNOSIS — E669 Obesity, unspecified: Secondary | ICD-10-CM

## 2018-08-19 DIAGNOSIS — R7303 Prediabetes: Secondary | ICD-10-CM | POA: Diagnosis not present

## 2018-08-19 DIAGNOSIS — Z6834 Body mass index (BMI) 34.0-34.9, adult: Secondary | ICD-10-CM

## 2018-08-19 MED ORDER — VITAMIN D (ERGOCALCIFEROL) 1.25 MG (50000 UNIT) PO CAPS: 50000.0000 [IU] | ORAL_CAPSULE | ORAL | 0 refills | Status: DC

## 2018-08-20 NOTE — Progress Notes (Signed)
Office: [phone]  /  Fax: [phone] TeleHealth Visit:  Gabriella Walker has verbally consented to this TeleHealth visit today. The patient is located at home, the provider is located at the [employer] [facility] office. The participants in this visit include the listed provider and patient and any and all parties involved. The visit was conducted today via telephone. Baily was unable to use realtime audiovisual technology today and the telehealth visit was conducted via telephone.  HPI:   Chief Complaint: OBESITY Livvy is here to discuss her progress with her obesity treatment plan. She is on the Category 2 plan and is following her eating plan approximately 80 % of the time. She states she is walking 10 minutes 7 times per week. Cheila states that she is unsure if she has lost or gained weight (weight 193 lbs, date not reported). Novali is watching her protein closely. We were unable to weigh the patient today for this TeleHealth visit. She feels unsure if she has lost weight since her last visit. She has lost 16 lbs since starting treatment with [place].  Vitamin D deficiency Judaea has a diagnosis of vitamin D deficiency. She is currently taking vit D and denies nausea, vomiting or muscle weakness.  Pre-Diabetes Jovanna has a diagnosis of prediabetes based on her elevated Hgb A1c and was informed this puts her at greater risk of developing diabetes. Her last A1c was at 5.7 and last insulin level was at 12.7 Jeylin is not taking medications currently and she continues to work on diet and exercise to decrease risk of diabetes. She does not get hungry between meals.  ASSESSMENT AND PLAN:  Prediabetes  Vitamin D deficiency - Plan: Vitamin D, Ergocalciferol, (DRISDOL) 1.25 MG (50000 UT) CAPS capsule  Class 1 obesity with serious comorbidity and body mass index (BMI) of 34.0 to 34.9 in adult, unspecified obesity type  PLAN:  Vitamin D Deficiency Carolena was informed that low vitamin D  levels contributes to fatigue and are associated with obesity, breast, and colon cancer. Gem agrees to continue to take prescription Vit D @50 ,000 IU every 14 days #2 with no refills and she will follow up for routine testing of vitamin D, at least 2-3 times per year. She was informed of the risk of over-replacement of vitamin D and agrees to not increase her dose unless she discusses this with [place] first. Lunell agrees to follow up with our clinic in 2 weeks.  Pre-Diabetes Tammye will continue to work on weight loss, exercise, increasing lean protein and decreasing simple carbohydrates in her diet to help decrease the risk of diabetes. She was informed that eating too many simple carbohydrates or too many calories at one sitting increases the likelihood of GI side effects. Enda agreed to follow up with [place] as directed to monitor her progress.  Obesity Lisel is currently in the action stage of change. As such, her goal is to continue with weight loss efforts She has agreed to follow the Category 2 plan Zilpha has been instructed to work up to a goal of 150 minutes of combined cardio and strengthening exercise per week for weight loss and overall health benefits. We discussed the following Behavioral Modification Strategies today: planning for success, increase H2O intake, no skipping meals, keeping healthy foods in the home, increasing lean protein intake, decreasing simple carbohydrates, increasing vegetables, decrease eating out and work on meal planning and intentional eating  Beonka has agreed to follow up with our clinic in 2 weeks. She  was informed of the importance of frequent follow up visits to maximize her success with intensive lifestyle modifications for her multiple health conditions.  ALLERGIES: Allergies  Allergen Reactions  . Prilosec Otc [Omeprazole Magnesium] Itching    MEDICATIONS: Current Outpatient Medications on File Prior to Visit  Medication Sig Dispense Refill  . Calcium  Carb-Cholecalciferol (CALCIUM + D3 PO) Take 2 tablets by mouth daily.    Marland Kitchen diltiazem (CARDIZEM) 30 MG tablet Take 1 tablet every 4 hours AS NEEDED for AFIB heart rate >100 45 tablet 1  . ELIQUIS 5 MG TABS tablet TAKE 1 TABLET BY MOUTH TWICE A DAY 60 tablet 10  . exemestane (AROMASIN) 25 MG tablet TAKE 1 TABLET EVERY DAY AFTER BREAKFAST 90 tablet 1  . famotidine-calcium carbonate-magnesium hydroxide (PEPCID COMPLETE) 10-800-165 MG chewable tablet Chew 1 tablet by mouth daily as needed (acid reflux).    . metoprolol (LOPRESSOR) 50 MG tablet Take 1 tablet by mouth 2 (two) times daily.    Marland Kitchen MYRBETRIQ 25 MG TB24 tablet Take 1 tablet by mouth daily.    Marland Kitchen PROAIR HFA 108 (90 Base) MCG/ACT inhaler Inhale 2 puffs into the lungs every 4 (four) hours as needed for shortness of breath.    . simvastatin (ZOCOR) 20 MG tablet Take 20 mg by mouth daily.    Marland Kitchen telmisartan (MICARDIS) 80 MG tablet Take 80 mg by mouth daily.     No current facility-administered medications on file prior to visit.     PAST MEDICAL HISTORY: Past Medical History:  Diagnosis Date  . Adenomatous colon polyp   . Atrial fibrillation ([place])   . Back pain   . Bladder leak   . Breast cancer ([place]) 2015   Right Breast Cancer  . GERD (gastroesophageal reflux disease)   . HTN (hypertension)   . Hyperlipidemia   . Joint pain   . Palpitation   . Personal history of radiation therapy 2015  . Radiation 08/03/13-08/24/13   right breast 42.72 gray  . SOB (shortness of breath)   . Swelling   . Wears glasses     PAST SURGICAL HISTORY: Past Surgical History:  Procedure Laterality Date  . APPENDECTOMY  1956  . BREAST BIOPSY Right 04/22/2013  . BREAST LUMPECTOMY Right 05/29/2013  . BREAST LUMPECTOMY WITH NEEDLE LOCALIZATION AND AXILLARY SENTINEL LYMPH NODE BX Right 05/29/2013   Procedure: BREAST LUMPECTOMY WITH NEEDLE LOCALIZATION AND AXILLARY SENTINEL LYMPH NODE BX;  Surgeon: Edward Jolly, MD;  Location: [place];   Service: General;  Laterality: Right;  . CARDIAC CATHETERIZATION  2008  . [place]  . KNEE ARTHROSCOPY  2001   right  . RE-EXCISION OF BREAST LUMPECTOMY Right 06/12/2013   Procedure: RE-EXCISION OF BREAST LUMPECTOMY;  Surgeon: Edward Jolly, MD;  Location: [place];  Service: General;  Laterality: Right;  . TUBAL LIGATION  1976  . UPPER GI ENDOSCOPY      SOCIAL HISTORY: Social History   Tobacco Use  . Smoking status: Never Smoker  . Smokeless tobacco: Never Used  Substance Use Topics  . Alcohol use: No  . Drug use: No    FAMILY HISTORY: Family History  Problem Relation Age of Onset  . Coronary artery disease Father        MI at age 34  . Heart disease Father   . Heart disease Mother   . Hypertension Mother   . Cancer Cousin 93       had lumpectomy  and radiation,  25 years ago  . Breast cancer Cousin   . Colon cancer Neg Hx   . Esophageal cancer Neg Hx   . Rectal cancer Neg Hx   . Stomach cancer Neg Hx     ROS: Review of Systems  Gastrointestinal: Negative for nausea and vomiting.  Musculoskeletal:       Negative for muscle weakness  Endo/Heme/Allergies:       Negative for polyphagia    PHYSICAL EXAM: Pt in no acute distress  RECENT LABS AND TESTS: BMET    Component Value Date/Time   NA 133 (L) 07/28/2018 0920   NA 138 07/20/2016 1005   K 4.6 07/28/2018 0920   K 4.0 07/20/2016 1005   CL 91 (L) 07/28/2018 0920   CO2 23 07/28/2018 0920   CO2 26 07/20/2016 1005   GLUCOSE 91 07/28/2018 0920   GLUCOSE 139 (H) 07/22/2018 1250   GLUCOSE 108 07/20/2016 1005   BUN 14 07/28/2018 0920   BUN 15.0 07/20/2016 1005   CREATININE 0.83 07/28/2018 0920   CREATININE 0.91 07/22/2018 1250   CREATININE 1.0 07/20/2016 1005   CALCIUM 9.5 07/28/2018 0920   CALCIUM 10.0 07/20/2016 1005   GFRNONAA 67 07/28/2018 0920   GFRNONAA >60 07/22/2018 1250   GFRAA 78 07/28/2018 0920   GFRAA >60 07/22/2018 1250   Lab Results   Component Value Date   HGBA1C 5.7 (H) 07/28/2018   HGBA1C 6.0 (H) 02/25/2018   Lab Results  Component Value Date   INSULIN 12.7 07/28/2018   INSULIN 9.7 02/25/2018   CBC    Component Value Date/Time   WBC 7.8 07/22/2018 1250   WBC 8.4 01/18/2017 1112   RBC 4.82 07/22/2018 1250   HGB 13.6 07/22/2018 1250   HGB 14.2 07/20/2016 1005   HCT 41.6 07/22/2018 1250   HCT 41.6 07/20/2016 1005   PLT 215 07/22/2018 1250   PLT 207 07/20/2016 1005   MCV 86.3 07/22/2018 1250   MCV 83.7 07/20/2016 1005   MCH 28.2 07/22/2018 1250   MCHC 32.7 07/22/2018 1250   RDW 12.8 07/22/2018 1250   RDW 13.4 07/20/2016 1005   LYMPHSABS 1.9 07/22/2018 1250   LYMPHSABS 2.6 07/20/2016 1005   MONOABS 0.5 07/22/2018 1250   MONOABS 0.7 07/20/2016 1005   EOSABS 0.3 07/22/2018 1250   EOSABS 0.3 07/20/2016 1005   BASOSABS 0.0 07/22/2018 1250   BASOSABS 0.1 07/20/2016 1005   Iron/TIBC/Ferritin/ %Sat No results found for: IRON, TIBC, FERRITIN, IRONPCTSAT Lipid Panel     Component Value Date/Time   CHOL 152 07/28/2018 0920   TRIG 89 07/28/2018 0920   HDL 37 (L) 07/28/2018 0920   LDLCALC 97 07/28/2018 0920   Hepatic Function Panel     Component Value Date/Time   PROT 6.6 07/28/2018 0920   PROT 7.0 07/20/2016 1005   ALBUMIN 4.4 07/28/2018 0920   ALBUMIN 3.7 07/20/2016 1005   AST 15 07/28/2018 0920   AST 15 07/22/2018 1250   AST 17 07/20/2016 1005   ALT 9 07/28/2018 0920   ALT 9 07/22/2018 1250   ALT 14 07/20/2016 1005   ALKPHOS 121 (H) 07/28/2018 0920   ALKPHOS 131 07/20/2016 1005   BILITOT 0.6 07/28/2018 0920   BILITOT 0.5 07/22/2018 1250   BILITOT 0.56 07/20/2016 1005      Component Value Date/Time   TSH 0.555 02/25/2018 0000   TSH 0.48 02/23/2016 1009     Ref. Range 07/28/2018 09:20  Vitamin D, 25-Hydroxy Latest Ref Range: 30.0 -  100.0 ng/mL 53.0    I, Doreene Nest, am acting as Location manager for [employer]. Owens Shark, DO  I have reviewed the above documentation for accuracy and  completeness, and I agree with the above. -Jearld Lesch, DO

## 2018-09-02 ENCOUNTER — Encounter (INDEPENDENT_AMBULATORY_CARE_PROVIDER_SITE_OTHER): Payer: Self-pay | Admitting: Bariatrics

## 2018-09-02 ENCOUNTER — Ambulatory Visit (INDEPENDENT_AMBULATORY_CARE_PROVIDER_SITE_OTHER): Payer: Medicare Other | Admitting: Bariatrics

## 2018-09-02 ENCOUNTER — Other Ambulatory Visit: Payer: Self-pay

## 2018-09-02 DIAGNOSIS — E6609 Other obesity due to excess calories: Secondary | ICD-10-CM | POA: Diagnosis not present

## 2018-09-02 DIAGNOSIS — Z6834 Body mass index (BMI) 34.0-34.9, adult: Secondary | ICD-10-CM | POA: Diagnosis not present

## 2018-09-02 DIAGNOSIS — R7303 Prediabetes: Secondary | ICD-10-CM | POA: Diagnosis not present

## 2018-09-02 DIAGNOSIS — E559 Vitamin D deficiency, unspecified: Secondary | ICD-10-CM

## 2018-09-02 NOTE — Progress Notes (Addendum)
Office: [phone]  /  Fax: [phone] TeleHealth Visit:  Gabriella Walker has verbally consented to this TeleHealth visit today. The patient is located at home, the provider is located at the [facility] office. The participants in this visit include the listed provider and patient. The visit was conducted today via telephone call 15 minutes ).   HPI:   Chief Complaint: OBESITY Gabriella Walker is here to discuss her progress with her obesity treatment plan. She is on the Category 1 plan and is following her eating plan approximately 60-65% of the time. She states she is walking 10-15 minutes 7 times per week. Gabriella Walker states that she has maintained her weight (174 lbs). She reports doing well with her water intake. We were unable to weigh the patient today for this TeleHealth visit. She feels as if she has maintained her weight since her last visit. She has lost 13 lbs since starting treatment with [place].  Pre-Diabetes [name] has a diagnosis of prediabetes based on her elevated Hgb A1c and was informed this puts her at greater risk of developing diabetes. Last A1c 5.7 on 07/28/2018 with an insulin of 12.7. She is not taking metformin currently and continues to work on diet and exercise to decrease risk of diabetes. She denies nausea or hypoglycemia. No polyphagia.  Vitamin D deficiency [name] has a diagnosis of Vitamin D deficiency. Last Vitamin D 53.0 on 07/28/2018. She is currently taking Vit D every 2 weeks and denies nausea, vomiting or muscle weakness.  Hair Loss [name] reports hair loss noted in her hairbrush.   ASSESSMENT AND PLAN:  Prediabetes  Vitamin D deficiency  Class 1 obesity due to excess calories with serious comorbidity and body mass index (BMI) of 34.0 to 34.9 in adult  PLAN:  Pre-Diabetes Gabriella Walker will continue to work on weight loss, exercise, and decreasing simple carbohydrates in her diet to help decrease the risk of diabetes. We dicussed metformin including  benefits and risks. She was informed that eating too many simple carbohydrates or too many calories at one sitting increases the likelihood of GI side effects. [name] will decrease carbohydrates, increase protein, and continue activity. She will follow-up with [place] as directed to monitor her progress.  Vitamin D Deficiency Gabriella Walker was informed that low Vitamin D levels contributes to fatigue and are associated with obesity, breast, and colon cancer. She agrees to continue taking Vit D and will follow-up for routine testing of Vitamin D, at least 2-3 times per year. She was informed of the risk of over-replacement of Vitamin D and agrees to not increase her dose unless she discusses this with [place] first. Gabriella Walker agrees to follow-up with our clinic in 2-3 weeks.  Hair Loss Gabriella Walker will take Biotin OTC (hair, nails, and skin) and Zinc OTC.   Obesity Gabriella Walker is currently in the action stage of change. As such, her goal is to continue with weight loss efforts. She has agreed to follow the Category 1 plan. Gabriella Walker will work on meal planning, intentional eating, and increasing protein. Gabriella Walker has been instructed to continue walking 10-15 minutes daily for weight loss and overall health benefits. We discussed the following Behavioral Modification Strategies today: increasing lean protein intake, decreasing simple carbohydrates, increasing vegetables, increase H20 intake, decrease eating out, no skipping meals, work on meal planning and easy cooking plans, keeping healthy foods in the home, and planning for success.  [name] has agreed to follow-up with our clinic in 2-3 weeks. She was informed of the importance of  frequent follow-up visits to maximize her success with intensive lifestyle modifications for her multiple health conditions.  ALLERGIES: Allergies  Allergen Reactions  . Prilosec Otc [Omeprazole Magnesium] Itching    MEDICATIONS: Current Outpatient Medications on File Prior to Visit  Medication Sig  Dispense Refill  . Calcium Carb-Cholecalciferol (CALCIUM + D3 PO) Take 2 tablets by mouth daily.    Marland Kitchen diltiazem (CARDIZEM) 30 MG tablet Take 1 tablet every 4 hours AS NEEDED for AFIB heart rate >100 45 tablet 1  . ELIQUIS 5 MG TABS tablet TAKE 1 TABLET BY MOUTH TWICE A DAY 60 tablet 10  . exemestane (AROMASIN) 25 MG tablet TAKE 1 TABLET EVERY DAY AFTER BREAKFAST 90 tablet 1  . famotidine-calcium carbonate-magnesium hydroxide (PEPCID COMPLETE) 10-800-165 MG chewable tablet Chew 1 tablet by mouth daily as needed (acid reflux).    . metoprolol (LOPRESSOR) 50 MG tablet Take 1 tablet by mouth 2 (two) times daily.    Marland Kitchen MYRBETRIQ 25 MG TB24 tablet Take 1 tablet by mouth daily.    Marland Kitchen PROAIR HFA 108 (90 Base) MCG/ACT inhaler Inhale 2 puffs into the lungs every 4 (four) hours as needed for shortness of breath.    . simvastatin (ZOCOR) 20 MG tablet Take 20 mg by mouth daily.    Marland Kitchen telmisartan (MICARDIS) 80 MG tablet Take 80 mg by mouth daily.    . Vitamin D, Ergocalciferol, (DRISDOL) 1.25 MG (50000 UT) CAPS capsule Take 1 capsule (50,000 Units total) by mouth every 14 (fourteen) days. 2 capsule 0   No current facility-administered medications on file prior to visit.     PAST MEDICAL HISTORY: Past Medical History:  Diagnosis Date  . Adenomatous colon polyp   . Atrial fibrillation ([place])   . Back pain   . Bladder leak   . Breast cancer ([place]) 2015   Right Breast Cancer  . GERD (gastroesophageal reflux disease)   . HTN (hypertension)   . Hyperlipidemia   . Joint pain   . Palpitation   . Personal history of radiation therapy 2015  . Radiation 08/03/13-08/24/13   right breast 42.72 gray  . SOB (shortness of breath)   . Swelling   . Wears glasses     PAST SURGICAL HISTORY: Past Surgical History:  Procedure Laterality Date  . APPENDECTOMY  1956  . BREAST BIOPSY Right 04/22/2013  . BREAST LUMPECTOMY Right 05/29/2013  . BREAST LUMPECTOMY WITH NEEDLE LOCALIZATION AND AXILLARY SENTINEL LYMPH NODE BX  Right 05/29/2013   Procedure: BREAST LUMPECTOMY WITH NEEDLE LOCALIZATION AND AXILLARY SENTINEL LYMPH NODE BX;  Surgeon: Edward Jolly, MD;  Location: [facility];  Service: General;  Laterality: Right;  . CARDIAC CATHETERIZATION  2008  . [place]  . KNEE ARTHROSCOPY  2001   right  . RE-EXCISION OF BREAST LUMPECTOMY Right 06/12/2013   Procedure: RE-EXCISION OF BREAST LUMPECTOMY;  Surgeon: Edward Jolly, MD;  Location: [facility];  Service: General;  Laterality: Right;  . TUBAL LIGATION  1976  . UPPER GI ENDOSCOPY      SOCIAL HISTORY: Social History   Tobacco Use  . Smoking status: Never Smoker  . Smokeless tobacco: Never Used  Substance Use Topics  . Alcohol use: No  . Drug use: No    FAMILY HISTORY: Family History  Problem Relation Age of Onset  . Coronary artery disease Father        MI at age 42  . Heart disease Father   . Heart disease  Mother   . Hypertension Mother   . Cancer Cousin 47       had lumpectomy and radiation,  25 years ago  . Breast cancer Cousin   . Colon cancer Neg Hx   . Esophageal cancer Neg Hx   . Rectal cancer Neg Hx   . Stomach cancer Neg Hx    ROS: Review of Systems  Constitutional:       Positive for hair loss.  Gastrointestinal: Negative for nausea and vomiting.  Musculoskeletal:       Negative for muscle weakness.  Endo/Heme/Allergies:       Negative for hypoglycemia. Negative for polyphagia.   PHYSICAL EXAM: Pt in no acute distress  RECENT LABS AND TESTS: BMET    Component Value Date/Time   NA 133 (L) 07/28/2018 0920   NA 138 07/20/2016 1005   K 4.6 07/28/2018 0920   K 4.0 07/20/2016 1005   CL 91 (L) 07/28/2018 0920   CO2 23 07/28/2018 0920   CO2 26 07/20/2016 1005   GLUCOSE 91 07/28/2018 0920   GLUCOSE 139 (H) 07/22/2018 1250   GLUCOSE 108 07/20/2016 1005   BUN 14 07/28/2018 0920   BUN 15.0 07/20/2016 1005   CREATININE 0.83 07/28/2018 0920   CREATININE 0.91  07/22/2018 1250   CREATININE 1.0 07/20/2016 1005   CALCIUM 9.5 07/28/2018 0920   CALCIUM 10.0 07/20/2016 1005   GFRNONAA 67 07/28/2018 0920   GFRNONAA >60 07/22/2018 1250   GFRAA 78 07/28/2018 0920   GFRAA >60 07/22/2018 1250   Lab Results  Component Value Date   HGBA1C 5.7 (H) 07/28/2018   HGBA1C 6.0 (H) 02/25/2018   Lab Results  Component Value Date   INSULIN 12.7 07/28/2018   INSULIN 9.7 02/25/2018   CBC    Component Value Date/Time   WBC 7.8 07/22/2018 1250   WBC 8.4 01/18/2017 1112   RBC 4.82 07/22/2018 1250   HGB 13.6 07/22/2018 1250   HGB 14.2 07/20/2016 1005   HCT 41.6 07/22/2018 1250   HCT 41.6 07/20/2016 1005   PLT 215 07/22/2018 1250   PLT 207 07/20/2016 1005   MCV 86.3 07/22/2018 1250   MCV 83.7 07/20/2016 1005   MCH 28.2 07/22/2018 1250   MCHC 32.7 07/22/2018 1250   RDW 12.8 07/22/2018 1250   RDW 13.4 07/20/2016 1005   LYMPHSABS 1.9 07/22/2018 1250   LYMPHSABS 2.6 07/20/2016 1005   MONOABS 0.5 07/22/2018 1250   MONOABS 0.7 07/20/2016 1005   EOSABS 0.3 07/22/2018 1250   EOSABS 0.3 07/20/2016 1005   BASOSABS 0.0 07/22/2018 1250   BASOSABS 0.1 07/20/2016 1005   Iron/TIBC/Ferritin/ %Sat No results found for: IRON, TIBC, FERRITIN, IRONPCTSAT Lipid Panel     Component Value Date/Time   CHOL 152 07/28/2018 0920   TRIG 89 07/28/2018 0920   HDL 37 (L) 07/28/2018 0920   LDLCALC 97 07/28/2018 0920   Hepatic Function Panel     Component Value Date/Time   PROT 6.6 07/28/2018 0920   PROT 7.0 07/20/2016 1005   ALBUMIN 4.4 07/28/2018 0920   ALBUMIN 3.7 07/20/2016 1005   AST 15 07/28/2018 0920   AST 15 07/22/2018 1250   AST 17 07/20/2016 1005   ALT 9 07/28/2018 0920   ALT 9 07/22/2018 1250   ALT 14 07/20/2016 1005   ALKPHOS 121 (H) 07/28/2018 0920   ALKPHOS 131 07/20/2016 1005   BILITOT 0.6 07/28/2018 0920   BILITOT 0.5 07/22/2018 1250   BILITOT 0.56 07/20/2016 1005  Component Value Date/Time   TSH 0.555 02/25/2018 0000   TSH 0.48  02/23/2016 1009   Results for NEYLAN, STONG (MRN [id number]) as of 09/02/2018 16:09  Ref. Range 07/28/2018 09:20  Vitamin D, 25-Hydroxy Latest Ref Range: 30.0 - 100.0 ng/mL 53.0   I, Michaelene Song, am acting as Location manager for [employer], DO  I have reviewed the above documentation for accuracy and completeness, and I agree with the above. -Jearld Lesch, DO

## 2018-09-03 ENCOUNTER — Encounter (INDEPENDENT_AMBULATORY_CARE_PROVIDER_SITE_OTHER): Payer: Self-pay | Admitting: Bariatrics

## 2018-09-22 ENCOUNTER — Encounter (INDEPENDENT_AMBULATORY_CARE_PROVIDER_SITE_OTHER): Payer: Self-pay

## 2018-09-23 ENCOUNTER — Other Ambulatory Visit: Payer: Self-pay

## 2018-09-23 ENCOUNTER — Ambulatory Visit (INDEPENDENT_AMBULATORY_CARE_PROVIDER_SITE_OTHER): Payer: Medicare Other | Admitting: Bariatrics

## 2018-09-23 ENCOUNTER — Encounter (INDEPENDENT_AMBULATORY_CARE_PROVIDER_SITE_OTHER): Payer: Self-pay | Admitting: Bariatrics

## 2018-09-23 DIAGNOSIS — R7303 Prediabetes: Secondary | ICD-10-CM | POA: Diagnosis not present

## 2018-09-23 DIAGNOSIS — Z6834 Body mass index (BMI) 34.0-34.9, adult: Secondary | ICD-10-CM | POA: Diagnosis not present

## 2018-09-23 DIAGNOSIS — E559 Vitamin D deficiency, unspecified: Secondary | ICD-10-CM | POA: Diagnosis not present

## 2018-09-23 DIAGNOSIS — E669 Obesity, unspecified: Secondary | ICD-10-CM

## 2018-09-23 MED ORDER — VITAMIN D (ERGOCALCIFEROL) 1.25 MG (50000 UNIT) PO CAPS: 50000.0000 [IU] | ORAL_CAPSULE | ORAL | 0 refills | Status: DC

## 2018-09-24 ENCOUNTER — Encounter (INDEPENDENT_AMBULATORY_CARE_PROVIDER_SITE_OTHER): Payer: Self-pay | Admitting: Bariatrics

## 2018-09-24 NOTE — Progress Notes (Addendum)
Office: [phone]  /  Fax: [phone] TeleHealth Visit:  Gabriella Walker has verbally consented to this TeleHealth visit today. The patient is located at home, the provider is located at the [employer] [facility] office. The participants in this visit include the listed provider and patient. The visit was conducted today via telephone call ( 22 minutes ).   HPI:   Chief Complaint: OBESITY Gabriella Walker is here to discuss her progress with her obesity treatment plan. She is on the Category 1 plan and is following her eating plan approximately 90% of the time. She states she is walking 15 minutes 3 times per week. Gabriella Walker states that she has lost 2 lbs since her last visit (weight 170).  We were unable to weigh the patient today for this TeleHealth visit. She feels as if she has lost 2 lbs since her last visit. She has lost 13 lbs since starting treatment with [place].  Vitamin D deficiency Gabriella Walker has a diagnosis of Vitamin D deficiency. Last Vitamin D 53.0 on 07/28/2018. She is currently taking prescription Vit D and denies nausea, vomiting or muscle weakness.  Pre-Diabetes Gabriella Walker has a diagnosis of prediabetes based on her elevated Hgb A1c and was informed this puts her at greater risk of developing diabetes. Last A1c 5.7 on 07/28/2018 with an insulin of 12.7.  She is not taking metformin currently and continues to work on diet and exercise to decrease risk of diabetes. She denies nausea or hypoglycemia. She does report polyphagia.  ASSESSMENT AND PLAN:  Vitamin D deficiency - Plan: Vitamin D, Ergocalciferol, (DRISDOL) 1.25 MG (50000 UT) CAPS capsule, DISCONTINUED: Vitamin D, Ergocalciferol, (DRISDOL) 1.25 MG (50000 UT) CAPS capsule  Prediabetes  Class 1 obesity with serious comorbidity and body mass index (BMI) of 34.0 to 34.9 in adult, unspecified obesity type  PLAN:  Vitamin D Deficiency Gabriella Walker was informed that low Vitamin D levels contributes to fatigue and are associated with obesity,  breast, and colon cancer. She agrees to continue to take prescription Vit D @ 50,000 IU every 14 days #2 with 0 refills and will follow-up for routine testing of Vitamin D, at least 2-3 times per year. She was informed of the risk of over-replacement of Vitamin D and agrees to not increase her dose unless she discusses this with [place] first. Gabriella Walker agrees to follow-up with our clinic in 2 weeks.  Pre-Diabetes Gabriella Walker will continue to work on weight loss, exercise, and decreasing simple carbohydrates in her diet to help decrease the risk of diabetes. We dicussed metformin including benefits and risks. She was informed that eating too many simple carbohydrates or too many calories at one sitting increases the likelihood of GI side effects. Gabriella Walker was instructed to decrease carbohydrates, increase protein, and increase exercise. She will follow-up with [place] as directed to monitor her progress.  Obesity Gabriella Walker is currently in the action stage of change. As such, her goal is to continue with weight loss efforts. She has agreed to follow the Category 1 plan. Gabriella Walker will work on meal planning and intentional eating. She will start working with her hands (quilting). Gabriella Walker has been instructed to do some walking around the neighborhood for weight loss and overall health benefits. We discussed the following Behavioral Modification Strategies today: increasing lean protein intake, decreasing simple carbohydrates, increasing vegetables, increase H20 intake, decrease eating out, no skipping meals, work on meal planning and easy cooking plans, keeping healthy foods in the home, and planning for success.  Gabriella Walker has agreed to  follow-up with our clinic in 2 weeks. She was informed of the importance of frequent follow-up visits to maximize her success with intensive lifestyle modifications for her multiple health conditions.  ALLERGIES: Allergies  Allergen Reactions  . Prilosec Otc [Omeprazole Magnesium] Itching     MEDICATIONS: Current Outpatient Medications on File Prior to Visit  Medication Sig Dispense Refill  . Calcium Carb-Cholecalciferol (CALCIUM + D3 PO) Take 2 tablets by mouth daily.    Marland Kitchen diltiazem (CARDIZEM) 30 MG tablet Take 1 tablet every 4 hours AS NEEDED for AFIB heart rate >100 45 tablet 1  . ELIQUIS 5 MG TABS tablet TAKE 1 TABLET BY MOUTH TWICE A DAY 60 tablet 10  . exemestane (AROMASIN) 25 MG tablet TAKE 1 TABLET EVERY DAY AFTER BREAKFAST 90 tablet 1  . famotidine-calcium carbonate-magnesium hydroxide (PEPCID COMPLETE) 10-800-165 MG chewable tablet Chew 1 tablet by mouth daily as needed (acid reflux).    . metoprolol (LOPRESSOR) 50 MG tablet Take 1 tablet by mouth 2 (two) times daily.    Marland Kitchen MYRBETRIQ 25 MG TB24 tablet Take 1 tablet by mouth daily.    Marland Kitchen PROAIR HFA 108 (90 Base) MCG/ACT inhaler Inhale 2 puffs into the lungs every 4 (four) hours as needed for shortness of breath.    . simvastatin (ZOCOR) 20 MG tablet Take 20 mg by mouth daily.    Marland Kitchen telmisartan (MICARDIS) 80 MG tablet Take 80 mg by mouth daily.     No current facility-administered medications on file prior to visit.     PAST MEDICAL HISTORY: Past Medical History:  Diagnosis Date  . Adenomatous colon polyp   . Atrial fibrillation ([place])   . Back pain   . Bladder leak   . Breast cancer ([place]) 2015   Right Breast Cancer  . GERD (gastroesophageal reflux disease)   . HTN (hypertension)   . Hyperlipidemia   . Joint pain   . Palpitation   . Personal history of radiation therapy 2015  . Radiation 08/03/13-08/24/13   right breast 42.72 gray  . SOB (shortness of breath)   . Swelling   . Wears glasses     PAST SURGICAL HISTORY: Past Surgical History:  Procedure Laterality Date  . APPENDECTOMY  1956  . BREAST BIOPSY Right 04/22/2013  . BREAST LUMPECTOMY Right 05/29/2013  . BREAST LUMPECTOMY WITH NEEDLE LOCALIZATION AND AXILLARY SENTINEL LYMPH NODE BX Right 05/29/2013   Procedure: BREAST LUMPECTOMY WITH NEEDLE  LOCALIZATION AND AXILLARY SENTINEL LYMPH NODE BX;  Surgeon: Edward Jolly, MD;  Location: [facility];  Service: General;  Laterality: Right;  . CARDIAC CATHETERIZATION  2008  . [place]  . KNEE ARTHROSCOPY  2001   right  . RE-EXCISION OF BREAST LUMPECTOMY Right 06/12/2013   Procedure: RE-EXCISION OF BREAST LUMPECTOMY;  Surgeon: Edward Jolly, MD;  Location: [facility];  Service: General;  Laterality: Right;  . TUBAL LIGATION  1976  . UPPER GI ENDOSCOPY      SOCIAL HISTORY: Social History   Tobacco Use  . Smoking status: Never Smoker  . Smokeless tobacco: Never Used  Substance Use Topics  . Alcohol use: No  . Drug use: No    FAMILY HISTORY: Family History  Problem Relation Age of Onset  . Coronary artery disease Father        MI at age 34  . Heart disease Father   . Heart disease Mother   . Hypertension Mother   . Cancer Cousin 54  had lumpectomy and radiation,  25 years ago  . Breast cancer Cousin   . Colon cancer Neg Hx   . Esophageal cancer Neg Hx   . Rectal cancer Neg Hx   . Stomach cancer Neg Hx    ROS: Review of Systems  Gastrointestinal: Negative for nausea and vomiting.  Musculoskeletal:       Negative for muscle weakness.  Endo/Heme/Allergies:       Negative for hypoglycemia. Positive for polyphagia.   PHYSICAL EXAM: Pt in no acute distress  RECENT LABS AND TESTS: BMET    Component Value Date/Time   NA 133 (L) 07/28/2018 0920   NA 138 07/20/2016 1005   K 4.6 07/28/2018 0920   K 4.0 07/20/2016 1005   CL 91 (L) 07/28/2018 0920   CO2 23 07/28/2018 0920   CO2 26 07/20/2016 1005   GLUCOSE 91 07/28/2018 0920   GLUCOSE 139 (H) 07/22/2018 1250   GLUCOSE 108 07/20/2016 1005   BUN 14 07/28/2018 0920   BUN 15.0 07/20/2016 1005   CREATININE 0.83 07/28/2018 0920   CREATININE 0.91 07/22/2018 1250   CREATININE 1.0 07/20/2016 1005   CALCIUM 9.5 07/28/2018 0920   CALCIUM 10.0 07/20/2016  1005   GFRNONAA 67 07/28/2018 0920   GFRNONAA >60 07/22/2018 1250   GFRAA 78 07/28/2018 0920   GFRAA >60 07/22/2018 1250   Lab Results  Component Value Date   HGBA1C 5.7 (H) 07/28/2018   HGBA1C 6.0 (H) 02/25/2018   Lab Results  Component Value Date   INSULIN 12.7 07/28/2018   INSULIN 9.7 02/25/2018   CBC    Component Value Date/Time   WBC 7.8 07/22/2018 1250   WBC 8.4 01/18/2017 1112   RBC 4.82 07/22/2018 1250   HGB 13.6 07/22/2018 1250   HGB 14.2 07/20/2016 1005   HCT 41.6 07/22/2018 1250   HCT 41.6 07/20/2016 1005   PLT 215 07/22/2018 1250   PLT 207 07/20/2016 1005   MCV 86.3 07/22/2018 1250   MCV 83.7 07/20/2016 1005   MCH 28.2 07/22/2018 1250   MCHC 32.7 07/22/2018 1250   RDW 12.8 07/22/2018 1250   RDW 13.4 07/20/2016 1005   LYMPHSABS 1.9 07/22/2018 1250   LYMPHSABS 2.6 07/20/2016 1005   MONOABS 0.5 07/22/2018 1250   MONOABS 0.7 07/20/2016 1005   EOSABS 0.3 07/22/2018 1250   EOSABS 0.3 07/20/2016 1005   BASOSABS 0.0 07/22/2018 1250   BASOSABS 0.1 07/20/2016 1005   Iron/TIBC/Ferritin/ %Sat No results found for: IRON, TIBC, FERRITIN, IRONPCTSAT Lipid Panel     Component Value Date/Time   CHOL 152 07/28/2018 0920   TRIG 89 07/28/2018 0920   HDL 37 (L) 07/28/2018 0920   LDLCALC 97 07/28/2018 0920   Hepatic Function Panel     Component Value Date/Time   PROT 6.6 07/28/2018 0920   PROT 7.0 07/20/2016 1005   ALBUMIN 4.4 07/28/2018 0920   ALBUMIN 3.7 07/20/2016 1005   AST 15 07/28/2018 0920   AST 15 07/22/2018 1250   AST 17 07/20/2016 1005   ALT 9 07/28/2018 0920   ALT 9 07/22/2018 1250   ALT 14 07/20/2016 1005   ALKPHOS 121 (H) 07/28/2018 0920   ALKPHOS 131 07/20/2016 1005   BILITOT 0.6 07/28/2018 0920   BILITOT 0.5 07/22/2018 1250   BILITOT 0.56 07/20/2016 1005      Component Value Date/Time   TSH 0.555 02/25/2018 0000   TSH 0.48 02/23/2016 1009   Results for CHARLEY, ISKHAKOV (MRN [id number]) as of 09/24/2018 12:23  Ref. Range 07/28/2018 09:20   Vitamin D, 25-Hydroxy Latest Ref Range: 30.0 - 100.0 ng/mL 53.0   I, Michaelene Song, am acting as Location manager for [employer], DO  I have reviewed the above documentation for accuracy and completeness, and I agree with the above. -Jearld Lesch, DO

## 2018-10-07 ENCOUNTER — Encounter (INDEPENDENT_AMBULATORY_CARE_PROVIDER_SITE_OTHER): Payer: Self-pay | Admitting: Bariatrics

## 2018-10-07 ENCOUNTER — Telehealth (INDEPENDENT_AMBULATORY_CARE_PROVIDER_SITE_OTHER): Payer: Medicare Other | Admitting: Bariatrics

## 2018-10-07 ENCOUNTER — Other Ambulatory Visit: Payer: Self-pay

## 2018-10-07 DIAGNOSIS — E669 Obesity, unspecified: Secondary | ICD-10-CM | POA: Diagnosis not present

## 2018-10-07 DIAGNOSIS — E559 Vitamin D deficiency, unspecified: Secondary | ICD-10-CM

## 2018-10-07 DIAGNOSIS — Z6834 Body mass index (BMI) 34.0-34.9, adult: Secondary | ICD-10-CM

## 2018-10-07 DIAGNOSIS — R7303 Prediabetes: Secondary | ICD-10-CM | POA: Diagnosis not present

## 2018-10-08 NOTE — Progress Notes (Signed)
Office: [phone]  /  Fax: [phone] TeleHealth Visit:  Gabriella Walker has verbally consented to this TeleHealth visit today. The patient is located at home, the provider is located at the [facility] office. The participants in this visit include the listed provider and patient. The visit was conducted today via telephone call (15 minutes).  HPI:   Chief Complaint: OBESITY Gabriella Walker is here to discuss her progress with her obesity treatment plan. She is on the Category 1 plan and is following her eating plan approximately 50% of the time. She states she is walking 15 minutes 3 times per week. Gabriella Walker states that she is down another 1 lb and doing well overall (weight 169). She is still unpacking and states she has not struggled with anything. She reports doing well with her water and protein intake. We were unable to weigh the patient today for this TeleHealth visit. She feels as if she has lost 1 lb since her last visit. She has lost 13 lbs since starting treatment with [place].  Pre-Diabetes Gabriella Walker has a diagnosis of prediabetes based on her elevated Hgb A1c and was informed this puts her at greater risk of developing diabetes. Last A1c 5.7 on 07/28/2018 with an insulin of 12.7. She is on no medication currently and continues to work on diet and exercise to decrease risk of diabetes. She denies nausea or hypoglycemia. No polyphagia.  Vitamin D deficiency Gabriella Walker has a diagnosis of Vitamin D deficiency. She is currently taking Vit D and denies nausea, vomiting or muscle weakness.  ASSESSMENT AND PLAN:  Prediabetes  Vitamin D deficiency  Class 1 obesity with serious comorbidity and body mass index (BMI) of 34.0 to 34.9 in adult, unspecified obesity type  PLAN:  Pre-Diabetes [name] will continue to work on weight loss, exercise, and decreasing simple carbohydrates in her diet to help decrease the risk of diabetes. We dicussed metformin including benefits and risks. She was  informed that eating too many simple carbohydrates or too many calories at one sitting increases the likelihood of GI side effects. [name] was instructed to decrease carbohydrates, increase protein, and increase activity. She will follow-up with [place] as directed to monitor her progress.  Vitamin D Deficiency [name] was informed that low Vitamin D levels contributes to fatigue and are associated with obesity, breast, and colon cancer. She agrees to continue taking Vit D and will follow-up for routine testing of Vitamin D, at least 2-3 times per year. She was informed of the risk of over-replacement of Vitamin D and agrees to not increase her dose unless she discusses this with [place] first. [name] agrees to follow-up with our clinic in 2 weeks.  Obesity [name] is currently in the action stage of change. As such, her goal is to continue with weight loss efforts. She has agreed to follow the Category 1 plan. Gabriella Walker will work on meal planning, intentional eating, and increasing activity. Gabriella Walker has been instructed to increase her exercise (walking) for weight loss and overall health benefits. We discussed the following Behavioral Modification Strategies today: increasing lean protein intake, decreasing simple carbohydrates, increasing vegetables, increase H20 intake, decrease eating out, no skipping meals, work on meal planning and easy cooking plans, keeping healthy foods in the home, and planning for success.  Gabriella Walker has agreed to follow-up with our clinic in 2 weeks. She was informed of the importance of frequent follow-up visits to maximize her success with intensive lifestyle modifications for her multiple health conditions.  ALLERGIES: Allergies  Allergen Reactions  . Prilosec Otc [Omeprazole Magnesium] Itching    MEDICATIONS: Current Outpatient Medications on File Prior to Visit  Medication Sig Dispense Refill  . Calcium Carb-Cholecalciferol (CALCIUM + D3 PO) Take 2 tablets by mouth daily.    Marland Kitchen  diltiazem (CARDIZEM) 30 MG tablet Take 1 tablet every 4 hours AS NEEDED for AFIB heart rate >100 45 tablet 1  . ELIQUIS 5 MG TABS tablet TAKE 1 TABLET BY MOUTH TWICE A DAY 60 tablet 10  . exemestane (AROMASIN) 25 MG tablet TAKE 1 TABLET EVERY DAY AFTER BREAKFAST 90 tablet 1  . famotidine-calcium carbonate-magnesium hydroxide (PEPCID COMPLETE) 10-800-165 MG chewable tablet Chew 1 tablet by mouth daily as needed (acid reflux).    . metoprolol (LOPRESSOR) 50 MG tablet Take 1 tablet by mouth 2 (two) times daily.    Marland Kitchen MYRBETRIQ 25 MG TB24 tablet Take 1 tablet by mouth daily.    Marland Kitchen PROAIR HFA 108 (90 Base) MCG/ACT inhaler Inhale 2 puffs into the lungs every 4 (four) hours as needed for shortness of breath.    . simvastatin (ZOCOR) 20 MG tablet Take 20 mg by mouth daily.    Marland Kitchen telmisartan (MICARDIS) 80 MG tablet Take 80 mg by mouth daily.    . Vitamin D, Ergocalciferol, (DRISDOL) 1.25 MG (50000 UT) CAPS capsule Take 1 capsule (50,000 Units total) by mouth every 14 (fourteen) days. 2 capsule 0   No current facility-administered medications on file prior to visit.     PAST MEDICAL HISTORY: Past Medical History:  Diagnosis Date  . Adenomatous colon polyp   . Atrial fibrillation ([place])   . Back pain   . Bladder leak   . Breast cancer ([place]) 2015   Right Breast Cancer  . GERD (gastroesophageal reflux disease)   . HTN (hypertension)   . Hyperlipidemia   . Joint pain   . Palpitation   . Personal history of radiation therapy 2015  . Radiation 08/03/13-08/24/13   right breast 42.72 gray  . SOB (shortness of breath)   . Swelling   . Wears glasses     PAST SURGICAL HISTORY: Past Surgical History:  Procedure Laterality Date  . APPENDECTOMY  1956  . BREAST BIOPSY Right 04/22/2013  . BREAST LUMPECTOMY Right 05/29/2013  . BREAST LUMPECTOMY WITH NEEDLE LOCALIZATION AND AXILLARY SENTINEL LYMPH NODE BX Right 05/29/2013   Procedure: BREAST LUMPECTOMY WITH NEEDLE LOCALIZATION AND AXILLARY SENTINEL LYMPH NODE  BX;  Surgeon: Edward Jolly, MD;  Location: [facility];  Service: General;  Laterality: Right;  . CARDIAC CATHETERIZATION  2008  . [place]  . KNEE ARTHROSCOPY  2001   right  . RE-EXCISION OF BREAST LUMPECTOMY Right 06/12/2013   Procedure: RE-EXCISION OF BREAST LUMPECTOMY;  Surgeon: Edward Jolly, MD;  Location: [facility];  Service: General;  Laterality: Right;  . TUBAL LIGATION  1976  . UPPER GI ENDOSCOPY      SOCIAL HISTORY: Social History   Tobacco Use  . Smoking status: Never Smoker  . Smokeless tobacco: Never Used  Substance Use Topics  . Alcohol use: No  . Drug use: No    FAMILY HISTORY: Family History  Problem Relation Age of Onset  . Coronary artery disease Father        MI at age 77  . Heart disease Father   . Heart disease Mother   . Hypertension Mother   . Cancer Cousin 6       had lumpectomy  and radiation,  25 years ago  . Breast cancer Cousin   . Colon cancer Neg Hx   . Esophageal cancer Neg Hx   . Rectal cancer Neg Hx   . Stomach cancer Neg Hx    ROS: Review of Systems  Gastrointestinal: Negative for nausea and vomiting.  Musculoskeletal:       Negative for muscle weakness.  Endo/Heme/Allergies:       Negative for hypoglycemia. Negative for polyphagia.   PHYSICAL EXAM: Pt in no acute distress  RECENT LABS AND TESTS: BMET    Component Value Date/Time   NA 133 (L) 07/28/2018 0920   NA 138 07/20/2016 1005   K 4.6 07/28/2018 0920   K 4.0 07/20/2016 1005   CL 91 (L) 07/28/2018 0920   CO2 23 07/28/2018 0920   CO2 26 07/20/2016 1005   GLUCOSE 91 07/28/2018 0920   GLUCOSE 139 (H) 07/22/2018 1250   GLUCOSE 108 07/20/2016 1005   BUN 14 07/28/2018 0920   BUN 15.0 07/20/2016 1005   CREATININE 0.83 07/28/2018 0920   CREATININE 0.91 07/22/2018 1250   CREATININE 1.0 07/20/2016 1005   CALCIUM 9.5 07/28/2018 0920   CALCIUM 10.0 07/20/2016 1005   GFRNONAA 67 07/28/2018 0920   GFRNONAA  >60 07/22/2018 1250   GFRAA 78 07/28/2018 0920   GFRAA >60 07/22/2018 1250   Lab Results  Component Value Date   HGBA1C 5.7 (H) 07/28/2018   HGBA1C 6.0 (H) 02/25/2018   Lab Results  Component Value Date   INSULIN 12.7 07/28/2018   INSULIN 9.7 02/25/2018   CBC    Component Value Date/Time   WBC 7.8 07/22/2018 1250   WBC 8.4 01/18/2017 1112   RBC 4.82 07/22/2018 1250   HGB 13.6 07/22/2018 1250   HGB 14.2 07/20/2016 1005   HCT 41.6 07/22/2018 1250   HCT 41.6 07/20/2016 1005   PLT 215 07/22/2018 1250   PLT 207 07/20/2016 1005   MCV 86.3 07/22/2018 1250   MCV 83.7 07/20/2016 1005   MCH 28.2 07/22/2018 1250   MCHC 32.7 07/22/2018 1250   RDW 12.8 07/22/2018 1250   RDW 13.4 07/20/2016 1005   LYMPHSABS 1.9 07/22/2018 1250   LYMPHSABS 2.6 07/20/2016 1005   MONOABS 0.5 07/22/2018 1250   MONOABS 0.7 07/20/2016 1005   EOSABS 0.3 07/22/2018 1250   EOSABS 0.3 07/20/2016 1005   BASOSABS 0.0 07/22/2018 1250   BASOSABS 0.1 07/20/2016 1005   Iron/TIBC/Ferritin/ %Sat No results found for: IRON, TIBC, FERRITIN, IRONPCTSAT Lipid Panel     Component Value Date/Time   CHOL 152 07/28/2018 0920   TRIG 89 07/28/2018 0920   HDL 37 (L) 07/28/2018 0920   LDLCALC 97 07/28/2018 0920   Hepatic Function Panel     Component Value Date/Time   PROT 6.6 07/28/2018 0920   PROT 7.0 07/20/2016 1005   ALBUMIN 4.4 07/28/2018 0920   ALBUMIN 3.7 07/20/2016 1005   AST 15 07/28/2018 0920   AST 15 07/22/2018 1250   AST 17 07/20/2016 1005   ALT 9 07/28/2018 0920   ALT 9 07/22/2018 1250   ALT 14 07/20/2016 1005   ALKPHOS 121 (H) 07/28/2018 0920   ALKPHOS 131 07/20/2016 1005   BILITOT 0.6 07/28/2018 0920   BILITOT 0.5 07/22/2018 1250   BILITOT 0.56 07/20/2016 1005      Component Value Date/Time   TSH 0.555 02/25/2018 0000   TSH 0.48 02/23/2016 1009   Results for JOSHELYN, ZARCONE (MRN [id number]) as of 10/08/2018 06:51  Ref. Range  07/28/2018 09:20  Vitamin D, 25-Hydroxy Latest Ref Range: 30.0 -  100.0 ng/mL 53.0   I, Michaelene Song, am acting as Location manager for [employer], DO  I have reviewed the above documentation for accuracy and completeness, and I agree with the above. -Jearld Lesch, DO

## 2018-10-21 ENCOUNTER — Encounter (INDEPENDENT_AMBULATORY_CARE_PROVIDER_SITE_OTHER): Payer: Self-pay | Admitting: Bariatrics

## 2018-10-21 ENCOUNTER — Telehealth (INDEPENDENT_AMBULATORY_CARE_PROVIDER_SITE_OTHER): Payer: Medicare Other | Admitting: Bariatrics

## 2018-10-21 ENCOUNTER — Other Ambulatory Visit: Payer: Self-pay

## 2018-10-21 DIAGNOSIS — E6609 Other obesity due to excess calories: Secondary | ICD-10-CM | POA: Diagnosis not present

## 2018-10-21 DIAGNOSIS — R7303 Prediabetes: Secondary | ICD-10-CM | POA: Diagnosis not present

## 2018-10-21 DIAGNOSIS — Z6834 Body mass index (BMI) 34.0-34.9, adult: Secondary | ICD-10-CM | POA: Diagnosis not present

## 2018-10-21 DIAGNOSIS — E559 Vitamin D deficiency, unspecified: Secondary | ICD-10-CM | POA: Diagnosis not present

## 2018-10-22 ENCOUNTER — Ambulatory Visit (INDEPENDENT_AMBULATORY_CARE_PROVIDER_SITE_OTHER): Payer: Medicare Other | Admitting: Bariatrics

## 2018-10-22 NOTE — Progress Notes (Signed)
Office: [phone]  /  Fax: [phone] TeleHealth Visit:  Gabriella Walker has verbally consented to this TeleHealth visit today. The patient is located at home, the provider is located at the [facility] [employer] [facility] office. The participants in this visit include the listed provider and patient. The visit was conducted today via telephone call ( 15 minutes ).   HPI:   Chief Complaint: OBESITY Gabriella Walker is here to discuss her progress with her obesity treatment plan. She is on the Category 1 plan and is following her eating plan approximately 60% of the time. She states she is walking 15 minutes 2 times per week. Corry states that she has gained 1-2 lbs and attributes that to fluid retention as she was traveling and "got stuck in traffic." She states that her feet were "dependent" too long. We were unable to weigh the patient today for this TeleHealth visit. She feels as if she has gained 1-2 lbs since her last visit. She has lost 13 lbs since starting treatment with [place].  Vitamin D deficiency Gabriella Walker has a diagnosis of Vitamin D deficiency. Last Vitamin D 53.0 on 07/28/2018. She is not currently taking Vit D and denies nausea, vomiting or muscle weakness.  Pre-Diabetes Gabriella Walker has a diagnosis of prediabetes based on her elevated Hgb A1c and was informed this puts her at greater risk of developing diabetes. Last A1c 5.7 on 07/28/2018 with an insulin of 12.7. She is not taking metformin currently and continues to work on diet and exercise to decrease risk of diabetes. She denies nausea or hypoglycemia.  ASSESSMENT AND PLAN:  Vitamin D deficiency  Prediabetes  Class 1 obesity due to excess calories with serious comorbidity and body mass index (BMI) of 34.0 to 34.9 in adult  PLAN:  Vitamin D Deficiency Shannie was informed that low Vitamin D levels contributes to fatigue and are associated with obesity, breast, and colon cancer. She agrees to begin Vitamin D 2,000 IU OTC daily and will  follow-up for routine testing of Vitamin D, at least 2-3 times per year. She was informed of the risk of over-replacement of Vitamin D and agrees to not increase her dose unless she discusses this with [place] first. Davesha agrees to follow-up with our clinic in 2 weeks.  Pre-Diabetes Gabriella Walker will continue to work on weight loss, exercise, and decreasing simple carbohydrates in her diet to help decrease the risk of diabetes. We dicussed metformin including benefits and risks. She was informed that eating too many simple carbohydrates or too many calories at one sitting increases the likelihood of GI side effects. Daniell was instructed to decrease carbohydrates, increase protein, increase healthy fats, and continue her activities/exercise. She will follow-up with [place] as directed to monitor her progress.  Obesity Gabriella Walker is currently in the action stage of change. As such, her goal is to continue with weight loss efforts. She has agreed to follow the Category 1 plan. Gabriella Walker will elevate feet/legs above her heart.  She will work on meal planning, intentional eating, and will not add salt, keeping salt to 3,000 mg or less daily. Gabriella Walker has been instructed to resume walking for weight loss and overall health benefits. We discussed the following Behavioral Modification Strategies today: increasing lean protein intake, decreasing simple carbohydrates, increasing vegetables, increase H20 intake, decrease eating out, no skipping meals, work on meal planning and easy cooking plans, keeping healthy foods in the home, and planning for success.  Gabriella Walker has agreed to follow-up with our clinic in 2 weeks.  She was informed of the importance of frequent follow-up visits to maximize her success with intensive lifestyle modifications for her multiple health conditions.  ALLERGIES: Allergies  Allergen Reactions  . Prilosec Otc [Omeprazole Magnesium] Itching    MEDICATIONS: Current Outpatient Medications on File Prior to Visit   Medication Sig Dispense Refill  . Calcium Carb-Cholecalciferol (CALCIUM + D3 PO) Take 2 tablets by mouth daily.    Marland Kitchen diltiazem (CARDIZEM) 30 MG tablet Take 1 tablet every 4 hours AS NEEDED for AFIB heart rate >100 45 tablet 1  . ELIQUIS 5 MG TABS tablet TAKE 1 TABLET BY MOUTH TWICE A DAY 60 tablet 10  . exemestane (AROMASIN) 25 MG tablet TAKE 1 TABLET EVERY DAY AFTER BREAKFAST 90 tablet 1  . famotidine-calcium carbonate-magnesium hydroxide (PEPCID COMPLETE) 10-800-165 MG chewable tablet Chew 1 tablet by mouth daily as needed (acid reflux).    . metoprolol (LOPRESSOR) 50 MG tablet Take 1 tablet by mouth 2 (two) times daily.    Marland Kitchen MYRBETRIQ 25 MG TB24 tablet Take 1 tablet by mouth daily.    Marland Kitchen PROAIR HFA 108 (90 Base) MCG/ACT inhaler Inhale 2 puffs into the lungs every 4 (four) hours as needed for shortness of breath.    . simvastatin (ZOCOR) 20 MG tablet Take 20 mg by mouth daily.    Marland Kitchen telmisartan (MICARDIS) 80 MG tablet Take 80 mg by mouth daily.    . Vitamin D, Ergocalciferol, (DRISDOL) 1.25 MG (50000 UT) CAPS capsule Take 1 capsule (50,000 Units total) by mouth every 14 (fourteen) days. 2 capsule 0   No current facility-administered medications on file prior to visit.     PAST MEDICAL HISTORY: Past Medical History:  Diagnosis Date  . Adenomatous colon polyp   . Atrial fibrillation ([place])   . Back pain   . Bladder leak   . Breast cancer ([place]) 2015   Right Breast Cancer  . GERD (gastroesophageal reflux disease)   . HTN (hypertension)   . Hyperlipidemia   . Joint pain   . Palpitation   . Personal history of radiation therapy 2015  . Radiation 08/03/13-08/24/13   right breast 42.72 gray  . SOB (shortness of breath)   . Swelling   . Wears glasses     PAST SURGICAL HISTORY: Past Surgical History:  Procedure Laterality Date  . APPENDECTOMY  1956  . BREAST BIOPSY Right 04/22/2013  . BREAST LUMPECTOMY Right 05/29/2013  . BREAST LUMPECTOMY WITH NEEDLE LOCALIZATION AND AXILLARY SENTINEL  LYMPH NODE BX Right 05/29/2013   Procedure: BREAST LUMPECTOMY WITH NEEDLE LOCALIZATION AND AXILLARY SENTINEL LYMPH NODE BX;  Surgeon: Edward Jolly, MD;  Location: [facility];  Service: General;  Laterality: Right;  . CARDIAC CATHETERIZATION  2008  . [place]  . KNEE ARTHROSCOPY  2001   right  . RE-EXCISION OF BREAST LUMPECTOMY Right 06/12/2013   Procedure: RE-EXCISION OF BREAST LUMPECTOMY;  Surgeon: Edward Jolly, MD;  Location: [facility];  Service: General;  Laterality: Right;  . TUBAL LIGATION  1976  . UPPER GI ENDOSCOPY      SOCIAL HISTORY: Social History   Tobacco Use  . Smoking status: Never Smoker  . Smokeless tobacco: Never Used  Substance Use Topics  . Alcohol use: No  . Drug use: No    FAMILY HISTORY: Family History  Problem Relation Age of Onset  . Coronary artery disease Father        MI at age 77  . Heart  disease Father   . Heart disease Mother   . Hypertension Mother   . Cancer Cousin 47       had lumpectomy and radiation,  25 years ago  . Breast cancer Cousin   . Colon cancer Neg Hx   . Esophageal cancer Neg Hx   . Rectal cancer Neg Hx   . Stomach cancer Neg Hx    ROS: Review of Systems  Gastrointestinal: Negative for nausea and vomiting.  Musculoskeletal:       Negative for muscle weakness.  Endo/Heme/Allergies:       Negative for hypoglycemia.   PHYSICAL EXAM: Pt in no acute distress  RECENT LABS AND TESTS: BMET    Component Value Date/Time   NA 133 (L) 07/28/2018 0920   NA 138 07/20/2016 1005   K 4.6 07/28/2018 0920   K 4.0 07/20/2016 1005   CL 91 (L) 07/28/2018 0920   CO2 23 07/28/2018 0920   CO2 26 07/20/2016 1005   GLUCOSE 91 07/28/2018 0920   GLUCOSE 139 (H) 07/22/2018 1250   GLUCOSE 108 07/20/2016 1005   BUN 14 07/28/2018 0920   BUN 15.0 07/20/2016 1005   CREATININE 0.83 07/28/2018 0920   CREATININE 0.91 07/22/2018 1250   CREATININE 1.0 07/20/2016 1005   CALCIUM  9.5 07/28/2018 0920   CALCIUM 10.0 07/20/2016 1005   GFRNONAA 67 07/28/2018 0920   GFRNONAA >60 07/22/2018 1250   GFRAA 78 07/28/2018 0920   GFRAA >60 07/22/2018 1250   Lab Results  Component Value Date   HGBA1C 5.7 (H) 07/28/2018   HGBA1C 6.0 (H) 02/25/2018   Lab Results  Component Value Date   INSULIN 12.7 07/28/2018   INSULIN 9.7 02/25/2018   CBC    Component Value Date/Time   WBC 7.8 07/22/2018 1250   WBC 8.4 01/18/2017 1112   RBC 4.82 07/22/2018 1250   HGB 13.6 07/22/2018 1250   HGB 14.2 07/20/2016 1005   HCT 41.6 07/22/2018 1250   HCT 41.6 07/20/2016 1005   PLT 215 07/22/2018 1250   PLT 207 07/20/2016 1005   MCV 86.3 07/22/2018 1250   MCV 83.7 07/20/2016 1005   MCH 28.2 07/22/2018 1250   MCHC 32.7 07/22/2018 1250   RDW 12.8 07/22/2018 1250   RDW 13.4 07/20/2016 1005   LYMPHSABS 1.9 07/22/2018 1250   LYMPHSABS 2.6 07/20/2016 1005   MONOABS 0.5 07/22/2018 1250   MONOABS 0.7 07/20/2016 1005   EOSABS 0.3 07/22/2018 1250   EOSABS 0.3 07/20/2016 1005   BASOSABS 0.0 07/22/2018 1250   BASOSABS 0.1 07/20/2016 1005   Iron/TIBC/Ferritin/ %Sat No results found for: IRON, TIBC, FERRITIN, IRONPCTSAT Lipid Panel     Component Value Date/Time   CHOL 152 07/28/2018 0920   TRIG 89 07/28/2018 0920   HDL 37 (L) 07/28/2018 0920   LDLCALC 97 07/28/2018 0920   Hepatic Function Panel     Component Value Date/Time   PROT 6.6 07/28/2018 0920   PROT 7.0 07/20/2016 1005   ALBUMIN 4.4 07/28/2018 0920   ALBUMIN 3.7 07/20/2016 1005   AST 15 07/28/2018 0920   AST 15 07/22/2018 1250   AST 17 07/20/2016 1005   ALT 9 07/28/2018 0920   ALT 9 07/22/2018 1250   ALT 14 07/20/2016 1005   ALKPHOS 121 (H) 07/28/2018 0920   ALKPHOS 131 07/20/2016 1005   BILITOT 0.6 07/28/2018 0920   BILITOT 0.5 07/22/2018 1250   BILITOT 0.56 07/20/2016 1005      Component Value Date/Time   TSH 0.555  02/25/2018 0000   TSH 0.48 02/23/2016 1009   Results for TEKOA, MAROHL (MRN [id number]) as  of 10/22/2018 15:57  Ref. Range 07/28/2018 09:20  Vitamin D, 25-Hydroxy Latest Ref Range: 30.0 - 100.0 ng/mL 53.0   I, Michaelene Song, am acting as Location manager for [employer], DO  I have reviewed the above documentation for accuracy and completeness, and I agree with the above. -Jearld Lesch, DO

## 2018-11-11 ENCOUNTER — Other Ambulatory Visit: Payer: Self-pay

## 2018-11-11 ENCOUNTER — Encounter (INDEPENDENT_AMBULATORY_CARE_PROVIDER_SITE_OTHER): Payer: Self-pay | Admitting: Bariatrics

## 2018-11-11 ENCOUNTER — Ambulatory Visit (INDEPENDENT_AMBULATORY_CARE_PROVIDER_SITE_OTHER): Payer: PRIVATE HEALTH INSURANCE | Admitting: Bariatrics

## 2018-11-11 DIAGNOSIS — Z6834 Body mass index (BMI) 34.0-34.9, adult: Secondary | ICD-10-CM

## 2018-11-11 DIAGNOSIS — E669 Obesity, unspecified: Secondary | ICD-10-CM | POA: Diagnosis not present

## 2018-11-11 DIAGNOSIS — E7849 Other hyperlipidemia: Secondary | ICD-10-CM

## 2018-11-11 DIAGNOSIS — I10 Essential (primary) hypertension: Secondary | ICD-10-CM | POA: Diagnosis not present

## 2018-11-12 NOTE — Progress Notes (Signed)
Office: [phone]  /  Fax: [phone] TeleHealth Visit:  Gabriella Walker has verbally consented to this TeleHealth visit today. The patient is located at home, the provider is located at the [employer] [facility] office. The participants in this visit include the listed provider and patient. Sneha was unable to use realtime audiovisual technology today and the telehealth visit was conducted via telephone.   HPI:   Chief Complaint: OBESITY Gabriella Walker is here to discuss her progress with her obesity treatment plan. She is on the Category 1 plan and is following her eating plan approximately 60 % of the time. She states she has been packing and moving for the last 5 days. Daizee states that she has gained 3 lbs. She ate too many carbohydrates. She is getting adequate water.  We were unable to weigh the patient today for this TeleHealth visit. She feels as if she has gained 3 lbs since her last visit. She has lost 13 lbs since starting treatment with [place].  Hyperlipidemia Gabriella Walker has hyperlipidemia and has been trying to improve her cholesterol levels with intensive lifestyle modification including a low saturated fat diet, exercise and weight loss. She is taking Zocor and denies any chest pain, claudication or myalgias.  Hypertension Gabriella Walker is a 78 y.o. female with hypertension. Kaho states her blood pressure was 146/70. She is taking Cardizem, Lopressor, and Micardis. She denies chest pain. She is working on weight loss to help control her blood pressure with the goal of decreasing her risk of heart attack and stroke.   ASSESSMENT AND PLAN:  Other hyperlipidemia  Essential hypertension  Class 1 obesity with serious comorbidity and body mass index (BMI) of 34.0 to 34.9 in adult, unspecified obesity type  PLAN:  Hyperlipidemia Gabriella Walker was informed of the American Heart Association Guidelines emphasizing intensive lifestyle modifications as the first line treatment for  hyperlipidemia. We discussed many lifestyle modifications today in depth, and Gabriella Walker will continue to work on decreasing saturated fats such as fatty red meat, butter and many fried foods. She will also increase vegetables and lean protein in her diet and continue to work on exercise and weight loss efforts. Gabriella Walker agrees to continue her medications, and she agrees to follow up with our clinic in 2 to 3 weeks.  Hypertension We discussed sodium restriction, working on healthy weight loss, and a regular exercise program as the means to achieve improved blood pressure control. Gabriella Walker agreed with this plan and agreed to follow up as directed. We will continue to monitor her blood pressure as well as her progress with the above lifestyle modifications. Gabriella Walker agrees to continue her blood pressure medications and will watch for signs of hypotension as she continues her lifestyle modifications. Gabriella Walker agrees to follow up with our clinic in 2 to 3 weeks.  Obesity Gabriella Walker is currently in the action stage of change. As such, her goal is to continue with weight loss efforts She has agreed to follow the Category 1 plan Gabriella Walker has been instructed to work up to a goal of 150 minutes of combined cardio and strengthening exercise per week or continue to be active for weight loss and overall health benefits. We discussed the following Behavioral Modification Strategies today: increasing lean protein intake, decreasing simple carbohydrates , increasing vegetables, decrease eating out and work on meal planning and easy cooking plans, increase H20 intake, no skipping meals, and keeping healthy foods in the home   Gabriella Walker has agreed to follow up with our clinic  in 2 to 3 weeks. She was informed of the importance of frequent follow up visits to maximize her success with intensive lifestyle modifications for her multiple health conditions.  ALLERGIES: Allergies  Allergen Reactions  . Prilosec Otc [Omeprazole Magnesium] Itching     MEDICATIONS: Current Outpatient Medications on File Prior to Visit  Medication Sig Dispense Refill  . Calcium Carb-Cholecalciferol (CALCIUM + D3 PO) Take 2 tablets by mouth daily.    Marland Kitchen diltiazem (CARDIZEM) 30 MG tablet Take 1 tablet every 4 hours AS NEEDED for AFIB heart rate >100 45 tablet 1  . ELIQUIS 5 MG TABS tablet TAKE 1 TABLET BY MOUTH TWICE A DAY 60 tablet 10  . exemestane (AROMASIN) 25 MG tablet TAKE 1 TABLET EVERY DAY AFTER BREAKFAST 90 tablet 1  . famotidine-calcium carbonate-magnesium hydroxide (PEPCID COMPLETE) 10-800-165 MG chewable tablet Chew 1 tablet by mouth daily as needed (acid reflux).    . metoprolol (LOPRESSOR) 50 MG tablet Take 1 tablet by mouth 2 (two) times daily.    Marland Kitchen MYRBETRIQ 25 MG TB24 tablet Take 1 tablet by mouth daily.    Marland Kitchen PROAIR HFA 108 (90 Base) MCG/ACT inhaler Inhale 2 puffs into the lungs every 4 (four) hours as needed for shortness of breath.    . simvastatin (ZOCOR) 20 MG tablet Take 20 mg by mouth daily.    Marland Kitchen telmisartan (MICARDIS) 80 MG tablet Take 80 mg by mouth daily.    . Vitamin D, Ergocalciferol, (DRISDOL) 1.25 MG (50000 UT) CAPS capsule Take 1 capsule (50,000 Units total) by mouth every 14 (fourteen) days. 2 capsule 0   No current facility-administered medications on file prior to visit.     PAST MEDICAL HISTORY: Past Medical History:  Diagnosis Date  . Adenomatous colon polyp   . Atrial fibrillation ([place])   . Back pain   . Bladder leak   . Breast cancer ([place]) 2015   Right Breast Cancer  . GERD (gastroesophageal reflux disease)   . HTN (hypertension)   . Hyperlipidemia   . Joint pain   . Palpitation   . Personal history of radiation therapy 2015  . Radiation 08/03/13-08/24/13   right breast 42.72 gray  . SOB (shortness of breath)   . Swelling   . Wears glasses     PAST SURGICAL HISTORY: Past Surgical History:  Procedure Laterality Date  . APPENDECTOMY  1956  . BREAST BIOPSY Right 04/22/2013  . BREAST LUMPECTOMY Right  05/29/2013  . BREAST LUMPECTOMY WITH NEEDLE LOCALIZATION AND AXILLARY SENTINEL LYMPH NODE BX Right 05/29/2013   Procedure: BREAST LUMPECTOMY WITH NEEDLE LOCALIZATION AND AXILLARY SENTINEL LYMPH NODE BX;  Surgeon: Edward Jolly, MD;  Location: [place];  Service: General;  Laterality: Right;  . CARDIAC CATHETERIZATION  2008  . [place]  . KNEE ARTHROSCOPY  2001   right  . RE-EXCISION OF BREAST LUMPECTOMY Right 06/12/2013   Procedure: RE-EXCISION OF BREAST LUMPECTOMY;  Surgeon: Edward Jolly, MD;  Location: [place];  Service: General;  Laterality: Right;  . TUBAL LIGATION  1976  . UPPER GI ENDOSCOPY      SOCIAL HISTORY: Social History   Tobacco Use  . Smoking status: Never Smoker  . Smokeless tobacco: Never Used  Substance Use Topics  . Alcohol use: No  . Drug use: No    FAMILY HISTORY: Family History  Problem Relation Age of Onset  . Coronary artery disease Father        MI  at age 22  . Heart disease Father   . Heart disease Mother   . Hypertension Mother   . Cancer Cousin 47       had lumpectomy and radiation,  25 years ago  . Breast cancer Cousin   . Colon cancer Neg Hx   . Esophageal cancer Neg Hx   . Rectal cancer Neg Hx   . Stomach cancer Neg Hx     ROS: Review of Systems  Constitutional: Negative for weight loss.  Cardiovascular: Negative for chest pain and claudication.  Musculoskeletal: Negative for myalgias.    PHYSICAL EXAM: Pt in no acute distress  RECENT LABS AND TESTS: BMET    Component Value Date/Time   NA 133 (L) 07/28/2018 0920   NA 138 07/20/2016 1005   K 4.6 07/28/2018 0920   K 4.0 07/20/2016 1005   CL 91 (L) 07/28/2018 0920   CO2 23 07/28/2018 0920   CO2 26 07/20/2016 1005   GLUCOSE 91 07/28/2018 0920   GLUCOSE 139 (H) 07/22/2018 1250   GLUCOSE 108 07/20/2016 1005   BUN 14 07/28/2018 0920   BUN 15.0 07/20/2016 1005   CREATININE 0.83 07/28/2018 0920   CREATININE  0.91 07/22/2018 1250   CREATININE 1.0 07/20/2016 1005   CALCIUM 9.5 07/28/2018 0920   CALCIUM 10.0 07/20/2016 1005   GFRNONAA 67 07/28/2018 0920   GFRNONAA >60 07/22/2018 1250   GFRAA 78 07/28/2018 0920   GFRAA >60 07/22/2018 1250   Lab Results  Component Value Date   HGBA1C 5.7 (H) 07/28/2018   HGBA1C 6.0 (H) 02/25/2018   Lab Results  Component Value Date   INSULIN 12.7 07/28/2018   INSULIN 9.7 02/25/2018   CBC    Component Value Date/Time   WBC 7.8 07/22/2018 1250   WBC 8.4 01/18/2017 1112   RBC 4.82 07/22/2018 1250   HGB 13.6 07/22/2018 1250   HGB 14.2 07/20/2016 1005   HCT 41.6 07/22/2018 1250   HCT 41.6 07/20/2016 1005   PLT 215 07/22/2018 1250   PLT 207 07/20/2016 1005   MCV 86.3 07/22/2018 1250   MCV 83.7 07/20/2016 1005   MCH 28.2 07/22/2018 1250   MCHC 32.7 07/22/2018 1250   RDW 12.8 07/22/2018 1250   RDW 13.4 07/20/2016 1005   LYMPHSABS 1.9 07/22/2018 1250   LYMPHSABS 2.6 07/20/2016 1005   MONOABS 0.5 07/22/2018 1250   MONOABS 0.7 07/20/2016 1005   EOSABS 0.3 07/22/2018 1250   EOSABS 0.3 07/20/2016 1005   BASOSABS 0.0 07/22/2018 1250   BASOSABS 0.1 07/20/2016 1005   Iron/TIBC/Ferritin/ %Sat No results found for: IRON, TIBC, FERRITIN, IRONPCTSAT Lipid Panel     Component Value Date/Time   CHOL 152 07/28/2018 0920   TRIG 89 07/28/2018 0920   HDL 37 (L) 07/28/2018 0920   LDLCALC 97 07/28/2018 0920   Hepatic Function Panel     Component Value Date/Time   PROT 6.6 07/28/2018 0920   PROT 7.0 07/20/2016 1005   ALBUMIN 4.4 07/28/2018 0920   ALBUMIN 3.7 07/20/2016 1005   AST 15 07/28/2018 0920   AST 15 07/22/2018 1250   AST 17 07/20/2016 1005   ALT 9 07/28/2018 0920   ALT 9 07/22/2018 1250   ALT 14 07/20/2016 1005   ALKPHOS 121 (H) 07/28/2018 0920   ALKPHOS 131 07/20/2016 1005   BILITOT 0.6 07/28/2018 0920   BILITOT 0.5 07/22/2018 1250   BILITOT 0.56 07/20/2016 1005      Component Value Date/Time   TSH 0.555 02/25/2018 0000  TSH 0.48  02/23/2016 1009      I, Trixie Dredge, am acting as transcriptionist for [employer], DO  I have reviewed the above documentation for accuracy and completeness, and I agree with the above. -Jearld Lesch, DO

## 2018-11-13 ENCOUNTER — Encounter (INDEPENDENT_AMBULATORY_CARE_PROVIDER_SITE_OTHER): Payer: Self-pay | Admitting: Bariatrics

## 2018-12-03 ENCOUNTER — Telehealth (INDEPENDENT_AMBULATORY_CARE_PROVIDER_SITE_OTHER): Payer: Medicare Other | Admitting: Bariatrics

## 2018-12-03 ENCOUNTER — Encounter (INDEPENDENT_AMBULATORY_CARE_PROVIDER_SITE_OTHER): Payer: Self-pay | Admitting: Bariatrics

## 2018-12-03 ENCOUNTER — Other Ambulatory Visit: Payer: Self-pay

## 2018-12-03 DIAGNOSIS — Z6835 Body mass index (BMI) 35.0-35.9, adult: Secondary | ICD-10-CM

## 2018-12-03 DIAGNOSIS — E559 Vitamin D deficiency, unspecified: Secondary | ICD-10-CM | POA: Diagnosis not present

## 2018-12-03 DIAGNOSIS — E7849 Other hyperlipidemia: Secondary | ICD-10-CM | POA: Diagnosis not present

## 2018-12-03 NOTE — Progress Notes (Signed)
Office: [phone]  /  Fax: [phone] TeleHealth Visit:  Gabriella Walker has verbally consented to this TeleHealth visit today. The patient is located at home, the provider is located at the [employer] [facility] office. The participants in this visit include the listed provider and patient. The visit was conducted today via telephone call.  TIME SPENT: 15 minutes   HPI:   Chief Complaint: OBESITY Gabriella Walker is here to discuss her progress with her obesity treatment plan. She is on the Category 1 plan and is following her eating plan approximately 75% of the time. She states she is exercising 0 minutes 0 times per week. Gabriella Walker has lost 2-4 lbs (weight 168). She is doing well with her water and protein intake. We were unable to weigh the patient today for this TeleHealth visit. She feels as if she has lost 2-4 lbs since her last visit. She has lost 13 lbs since starting treatment with [place].  Hyperlipidemia Gabriella Walker has hyperlipidemia and has been trying to improve her cholesterol levels with intensive lifestyle modification including a low saturated fat diet, exercise and weight loss. She is taking simvastatin and denies any chest pain, claudication or myalgias.  Vitamin D deficiency Gabriella Walker has a diagnosis of Vitamin D deficiency. She is currently taking Vit D and denies nausea, vomiting or muscle weakness.  ASSESSMENT AND PLAN:  Vitamin D deficiency  Other hyperlipidemia  Class 2 severe obesity with serious comorbidity and body mass index (BMI) of 35.0 to 35.9 in adult, unspecified obesity type ([place])  PLAN:  Hyperlipidemia Naje was informed of the American Heart Association Guidelines emphasizing intensive lifestyle modifications as the first line treatment for hyperlipidemia. We discussed many lifestyle modifications today in depth, and Chitara will continue to work on decreasing saturated fats such as fatty red meat, butter and many fried foods. [place] will continue her medications.  She will also increase vegetables and lean protein in her diet and continue to work on exercise and weight loss efforts.  Vitamin D Deficiency Gabriella Walker was informed that low Vitamin D levels contributes to fatigue and are associated with obesity, breast, and colon cancer. She agrees to continue taking Vit D and will follow-up for routine testing of Vitamin D, at least 2-3 times per year. She was informed of the risk of over-replacement of Vitamin D and agrees to not increase her dose unless she discusses this with [place] first. Gabriella Walker agrees to follow-up with our clinic in 2-3 weeks.  Obesity Gabriella Walker is currently in the action stage of change. As such, her goal is to continue with weight loss efforts. She has agreed to follow the Category 1 plan. Gabriella Walker will work on [employer]. She will continue to focus on meat and vegetables and read the protein. Gabriella Walker has been instructed to be more active, will walk, for weight loss and overall health benefits. We discussed the following Behavioral Modification Strategies today: increasing lean protein intake, decreasing simple carbohydrates, increasing vegetables, increase H20 intake, decrease eating out, no skipping meals, work on meal planning and easy cooking plans, keeping healthy foods in the home, and planning for success.  Gabriella Walker has agreed to follow-up with our clinic in 2-3 weeks. She was informed of the importance of frequent follow-up visits to maximize her success with intensive lifestyle modifications for her multiple health conditions.  ALLERGIES: Allergies  Allergen Reactions  . Prilosec Otc [Omeprazole Magnesium] Itching    MEDICATIONS: Current Outpatient Medications on File Prior to Visit  Medication Sig Dispense Refill  .  Calcium Carb-Cholecalciferol (CALCIUM + D3 PO) Take 2 tablets by mouth daily.    Marland Kitchen diltiazem (CARDIZEM) 30 MG tablet Take 1 tablet every 4 hours AS NEEDED for AFIB heart rate >100 45 tablet 1  . ELIQUIS 5 MG TABS tablet TAKE 1  TABLET BY MOUTH TWICE A DAY 60 tablet 10  . exemestane (AROMASIN) 25 MG tablet TAKE 1 TABLET EVERY DAY AFTER BREAKFAST 90 tablet 1  . famotidine-calcium carbonate-magnesium hydroxide (PEPCID COMPLETE) 10-800-165 MG chewable tablet Chew 1 tablet by mouth daily as needed (acid reflux).    . metoprolol (LOPRESSOR) 50 MG tablet Take 1 tablet by mouth 2 (two) times daily.    Marland Kitchen MYRBETRIQ 25 MG TB24 tablet Take 1 tablet by mouth daily.    Marland Kitchen PROAIR HFA 108 (90 Base) MCG/ACT inhaler Inhale 2 puffs into the lungs every 4 (four) hours as needed for shortness of breath.    . simvastatin (ZOCOR) 20 MG tablet Take 20 mg by mouth daily.    Marland Kitchen telmisartan (MICARDIS) 80 MG tablet Take 80 mg by mouth daily.    . Vitamin D, Ergocalciferol, (DRISDOL) 1.25 MG (50000 UT) CAPS capsule Take 1 capsule (50,000 Units total) by mouth every 14 (fourteen) days. 2 capsule 0   No current facility-administered medications on file prior to visit.     PAST MEDICAL HISTORY: Past Medical History:  Diagnosis Date  . Adenomatous colon polyp   . Atrial fibrillation ([place])   . Back pain   . Bladder leak   . Breast cancer ([place]) 2015   Right Breast Cancer  . GERD (gastroesophageal reflux disease)   . HTN (hypertension)   . Hyperlipidemia   . Joint pain   . Palpitation   . Personal history of radiation therapy 2015  . Radiation 08/03/13-08/24/13   right breast 42.72 gray  . SOB (shortness of breath)   . Swelling   . Wears glasses     PAST SURGICAL HISTORY: Past Surgical History:  Procedure Laterality Date  . APPENDECTOMY  1956  . BREAST BIOPSY Right 04/22/2013  . BREAST LUMPECTOMY Right 05/29/2013  . BREAST LUMPECTOMY WITH NEEDLE LOCALIZATION AND AXILLARY SENTINEL LYMPH NODE BX Right 05/29/2013   Procedure: BREAST LUMPECTOMY WITH NEEDLE LOCALIZATION AND AXILLARY SENTINEL LYMPH NODE BX;  Surgeon: Edward Jolly, MD;  Location: [place];  Service: General;  Laterality: Right;  . CARDIAC CATHETERIZATION   2008  . [place]  . KNEE ARTHROSCOPY  2001   right  . RE-EXCISION OF BREAST LUMPECTOMY Right 06/12/2013   Procedure: RE-EXCISION OF BREAST LUMPECTOMY;  Surgeon: Edward Jolly, MD;  Location: [place];  Service: General;  Laterality: Right;  . TUBAL LIGATION  1976  . UPPER GI ENDOSCOPY      SOCIAL HISTORY: Social History   Tobacco Use  . Smoking status: Never Smoker  . Smokeless tobacco: Never Used  Substance Use Topics  . Alcohol use: No  . Drug use: No    FAMILY HISTORY: Family History  Problem Relation Age of Onset  . Coronary artery disease Father        MI at age 39  . Heart disease Father   . Heart disease Mother   . Hypertension Mother   . Cancer Cousin 47       had lumpectomy and radiation,  25 years ago  . Breast cancer Cousin   . Colon cancer Neg Hx   . Esophageal cancer Neg Hx   .  Rectal cancer Neg Hx   . Stomach cancer Neg Hx    ROS: Review of Systems  Cardiovascular: Negative for chest pain and claudication.  Gastrointestinal: Negative for nausea and vomiting.  Musculoskeletal: Negative for myalgias.       Negative for muscle weakness.   PHYSICAL EXAM: Pt in no acute distress  RECENT LABS AND TESTS: BMET    Component Value Date/Time   NA 133 (L) 07/28/2018 0920   NA 138 07/20/2016 1005   K 4.6 07/28/2018 0920   K 4.0 07/20/2016 1005   CL 91 (L) 07/28/2018 0920   CO2 23 07/28/2018 0920   CO2 26 07/20/2016 1005   GLUCOSE 91 07/28/2018 0920   GLUCOSE 139 (H) 07/22/2018 1250   GLUCOSE 108 07/20/2016 1005   BUN 14 07/28/2018 0920   BUN 15.0 07/20/2016 1005   CREATININE 0.83 07/28/2018 0920   CREATININE 0.91 07/22/2018 1250   CREATININE 1.0 07/20/2016 1005   CALCIUM 9.5 07/28/2018 0920   CALCIUM 10.0 07/20/2016 1005   GFRNONAA 67 07/28/2018 0920   GFRNONAA >60 07/22/2018 1250   GFRAA 78 07/28/2018 0920   GFRAA >60 07/22/2018 1250   Lab Results  Component Value Date   HGBA1C 5.7 (H) 07/28/2018    HGBA1C 6.0 (H) 02/25/2018   Lab Results  Component Value Date   INSULIN 12.7 07/28/2018   INSULIN 9.7 02/25/2018   CBC    Component Value Date/Time   WBC 7.8 07/22/2018 1250   WBC 8.4 01/18/2017 1112   RBC 4.82 07/22/2018 1250   HGB 13.6 07/22/2018 1250   HGB 14.2 07/20/2016 1005   HCT 41.6 07/22/2018 1250   HCT 41.6 07/20/2016 1005   PLT 215 07/22/2018 1250   PLT 207 07/20/2016 1005   MCV 86.3 07/22/2018 1250   MCV 83.7 07/20/2016 1005   MCH 28.2 07/22/2018 1250   MCHC 32.7 07/22/2018 1250   RDW 12.8 07/22/2018 1250   RDW 13.4 07/20/2016 1005   LYMPHSABS 1.9 07/22/2018 1250   LYMPHSABS 2.6 07/20/2016 1005   MONOABS 0.5 07/22/2018 1250   MONOABS 0.7 07/20/2016 1005   EOSABS 0.3 07/22/2018 1250   EOSABS 0.3 07/20/2016 1005   BASOSABS 0.0 07/22/2018 1250   BASOSABS 0.1 07/20/2016 1005   Iron/TIBC/Ferritin/ %Sat No results found for: IRON, TIBC, FERRITIN, IRONPCTSAT Lipid Panel     Component Value Date/Time   CHOL 152 07/28/2018 0920   TRIG 89 07/28/2018 0920   HDL 37 (L) 07/28/2018 0920   LDLCALC 97 07/28/2018 0920   Hepatic Function Panel     Component Value Date/Time   PROT 6.6 07/28/2018 0920   PROT 7.0 07/20/2016 1005   ALBUMIN 4.4 07/28/2018 0920   ALBUMIN 3.7 07/20/2016 1005   AST 15 07/28/2018 0920   AST 15 07/22/2018 1250   AST 17 07/20/2016 1005   ALT 9 07/28/2018 0920   ALT 9 07/22/2018 1250   ALT 14 07/20/2016 1005   ALKPHOS 121 (H) 07/28/2018 0920   ALKPHOS 131 07/20/2016 1005   BILITOT 0.6 07/28/2018 0920   BILITOT 0.5 07/22/2018 1250   BILITOT 0.56 07/20/2016 1005      Component Value Date/Time   TSH 0.555 02/25/2018 0000   TSH 0.48 02/23/2016 1009   Results for NISSA, CAMPOPIANO (MRN [id number]) as of 12/03/2018 14:51  Ref. Range 07/28/2018 09:20  Vitamin D, 25-Hydroxy Latest Ref Range: 30.0 - 100.0 ng/mL 53.0   I, Michaelene Song, am acting as Location manager for [employer], DO  I have reviewed  the above documentation for accuracy  and completeness, and I agree with the above. -Jearld Lesch, DO

## 2018-12-17 ENCOUNTER — Encounter (INDEPENDENT_AMBULATORY_CARE_PROVIDER_SITE_OTHER): Payer: Self-pay | Admitting: Bariatrics

## 2018-12-17 ENCOUNTER — Telehealth (INDEPENDENT_AMBULATORY_CARE_PROVIDER_SITE_OTHER): Payer: Medicare Other | Admitting: Bariatrics

## 2018-12-17 ENCOUNTER — Other Ambulatory Visit: Payer: Self-pay

## 2018-12-17 DIAGNOSIS — E6609 Other obesity due to excess calories: Secondary | ICD-10-CM | POA: Diagnosis not present

## 2018-12-17 DIAGNOSIS — Z6834 Body mass index (BMI) 34.0-34.9, adult: Secondary | ICD-10-CM | POA: Diagnosis not present

## 2018-12-17 DIAGNOSIS — I10 Essential (primary) hypertension: Secondary | ICD-10-CM

## 2018-12-18 NOTE — Progress Notes (Signed)
Office: [phone]  /  Fax: [phone] TeleHealth Visit:  Gabriella Walker has verbally consented to this TeleHealth visit today. The patient is located at home, the provider is located at the [employer] [facility] office. The participants in this visit include the listed provider and patient. The visit was conducted today via telephone call (16 minutes).  HPI:  Chief Complaint: OBESITY Gabriella Walker is here to discuss her progress with her obesity treatment plan. She is on the Category 1 plan and states she is following her eating plan approximately 80% of the time. She states she is walking 15 minutes 5 days per week.  Gabriella Walker states her weight is down 2 lbs and she is doing well overall. She reports doing well with her water and protein intake.  Today's visit was #15 Starting weight: 193 lbs Starting date: 02/25/2018  Hypertension Gabriella Walker has a diagnosis of hypertension, which is well controlled. No lightheadedness.  ASSESSMENT AND PLAN:  Essential hypertension  Class 1 obesity due to excess calories with serious comorbidity and body mass index (BMI) of 34.0 to 34.9 in adult  PLAN:  Hypertension Gabriella Walker is working on healthy weight loss and exercise to improve blood pressure control. She will continue her medications. We will watch for signs of hypotension as she continues her lifestyle modifications.  Obesity Gabriella Walker is currently in the action stage of change. As such, her goal is to continue with weight loss efforts. She has agreed to follow the Category 1 plan. Gabriella Walker will work on meal planning, intentional eating, and increasing her protein. Her goal weight is 150 lbs. She has an appointment with her PCP in January 2021. Gabriella Walker has been instructed to continue walking for weight loss and overall health benefits. We discussed the following Behavioral Modification Strategies today: increasing lean protein intake, decreasing simple carbohydrates, increasing vegetables, increase H20  intake, no skipping meals, work on meal planning and easy cooking plans, keeping healthy foods in the home, ways to avoid night time snacking, better snacking choices, and emotional eating strategies.  Gabriella Walker has agreed to follow-up with our clinic in 3 weeks. She was informed of the importance of frequent follow-up visits to maximize her success with intensive lifestyle modifications for her multiple health conditions.  ALLERGIES: Allergies  Allergen Reactions  . Prilosec Otc [Omeprazole Magnesium] Itching    MEDICATIONS: Current Outpatient Medications on File Prior to Visit  Medication Sig Dispense Refill  . Calcium Carb-Cholecalciferol (CALCIUM + D3 PO) Take 2 tablets by mouth daily.    Marland Kitchen diltiazem (CARDIZEM) 30 MG tablet Take 1 tablet every 4 hours AS NEEDED for AFIB heart rate >100 45 tablet 1  . ELIQUIS 5 MG TABS tablet TAKE 1 TABLET BY MOUTH TWICE A DAY 60 tablet 10  . exemestane (AROMASIN) 25 MG tablet TAKE 1 TABLET EVERY DAY AFTER BREAKFAST 90 tablet 1  . famotidine-calcium carbonate-magnesium hydroxide (PEPCID COMPLETE) 10-800-165 MG chewable tablet Chew 1 tablet by mouth daily as needed (acid reflux).    . metoprolol (LOPRESSOR) 50 MG tablet Take 1 tablet by mouth 2 (two) times daily.    Marland Kitchen MYRBETRIQ 25 MG TB24 tablet Take 1 tablet by mouth daily.    Marland Kitchen PROAIR HFA 108 (90 Base) MCG/ACT inhaler Inhale 2 puffs into the lungs every 4 (four) hours as needed for shortness of breath.    . simvastatin (ZOCOR) 20 MG tablet Take 20 mg by mouth daily.    Marland Kitchen telmisartan (MICARDIS) 80 MG tablet Take 80 mg by mouth daily.    Marland Kitchen  Vitamin D, Ergocalciferol, (DRISDOL) 1.25 MG (50000 UT) CAPS capsule Take 1 capsule (50,000 Units total) by mouth every 14 (fourteen) days. 2 capsule 0   No current facility-administered medications on file prior to visit.    PAST MEDICAL HISTORY: Past Medical History:  Diagnosis Date  . Adenomatous colon polyp   . Atrial fibrillation ([place])   . Back pain   . Bladder  leak   . Breast cancer ([place]) 2015   Right Breast Cancer  . GERD (gastroesophageal reflux disease)   . HTN (hypertension)   . Hyperlipidemia   . Joint pain   . Palpitation   . Personal history of radiation therapy 2015  . Radiation 08/03/13-08/24/13   right breast 42.72 gray  . SOB (shortness of breath)   . Swelling   . Wears glasses     PAST SURGICAL HISTORY: Past Surgical History:  Procedure Laterality Date  . APPENDECTOMY  1956  . BREAST BIOPSY Right 04/22/2013  . BREAST LUMPECTOMY Right 05/29/2013  . BREAST LUMPECTOMY WITH NEEDLE LOCALIZATION AND AXILLARY SENTINEL LYMPH NODE BX Right 05/29/2013   Procedure: BREAST LUMPECTOMY WITH NEEDLE LOCALIZATION AND AXILLARY SENTINEL LYMPH NODE BX;  Surgeon: Edward Jolly, MD;  Location: [facility];  Service: General;  Laterality: Right;  . CARDIAC CATHETERIZATION  2008  . [place]  . KNEE ARTHROSCOPY  2001   right  . RE-EXCISION OF BREAST LUMPECTOMY Right 06/12/2013   Procedure: RE-EXCISION OF BREAST LUMPECTOMY;  Surgeon: Edward Jolly, MD;  Location: [facility];  Service: General;  Laterality: Right;  . TUBAL LIGATION  1976  . UPPER GI ENDOSCOPY      SOCIAL HISTORY: Social History   Tobacco Use  . Smoking status: Never Smoker  . Smokeless tobacco: Never Used  Substance Use Topics  . Alcohol use: No  . Drug use: No    FAMILY HISTORY: Family History  Problem Relation Age of Onset  . Coronary artery disease Father        MI at age 34  . Heart disease Father   . Heart disease Mother   . Hypertension Mother   . Cancer Cousin 47       had lumpectomy and radiation,  25 years ago  . Breast cancer Cousin   . Colon cancer Neg Hx   . Esophageal cancer Neg Hx   . Rectal cancer Neg Hx   . Stomach cancer Neg Hx    ROS: Review of Systems  Endo/Heme/Allergies:       Negative for lightheadedness.   PHYSICAL EXAM: There were no vitals taken for this visit. There is  no height or weight on file to calculate BMI. Physical Exam: Pt in no acute distress.  RECENT LABS AND TESTS: BMET    Component Value Date/Time   NA 133 (L) 07/28/2018 0920   NA 138 07/20/2016 1005   K 4.6 07/28/2018 0920   K 4.0 07/20/2016 1005   CL 91 (L) 07/28/2018 0920   CO2 23 07/28/2018 0920   CO2 26 07/20/2016 1005   GLUCOSE 91 07/28/2018 0920   GLUCOSE 139 (H) 07/22/2018 1250   GLUCOSE 108 07/20/2016 1005   BUN 14 07/28/2018 0920   BUN 15.0 07/20/2016 1005   CREATININE 0.83 07/28/2018 0920   CREATININE 0.91 07/22/2018 1250   CREATININE 1.0 07/20/2016 1005   CALCIUM 9.5 07/28/2018 0920   CALCIUM 10.0 07/20/2016 1005   GFRNONAA 67 07/28/2018 0920   GFRNONAA >60 07/22/2018 1250  GFRAA 78 07/28/2018 0920   GFRAA >60 07/22/2018 1250   Lab Results  Component Value Date   HGBA1C 5.7 (H) 07/28/2018   HGBA1C 6.0 (H) 02/25/2018   Lab Results  Component Value Date   INSULIN 12.7 07/28/2018   INSULIN 9.7 02/25/2018   CBC    Component Value Date/Time   WBC 7.8 07/22/2018 1250   WBC 8.4 01/18/2017 1112   RBC 4.82 07/22/2018 1250   HGB 13.6 07/22/2018 1250   HGB 14.2 07/20/2016 1005   HCT 41.6 07/22/2018 1250   HCT 41.6 07/20/2016 1005   PLT 215 07/22/2018 1250   PLT 207 07/20/2016 1005   MCV 86.3 07/22/2018 1250   MCV 83.7 07/20/2016 1005   MCH 28.2 07/22/2018 1250   MCHC 32.7 07/22/2018 1250   RDW 12.8 07/22/2018 1250   RDW 13.4 07/20/2016 1005   LYMPHSABS 1.9 07/22/2018 1250   LYMPHSABS 2.6 07/20/2016 1005   MONOABS 0.5 07/22/2018 1250   MONOABS 0.7 07/20/2016 1005   EOSABS 0.3 07/22/2018 1250   EOSABS 0.3 07/20/2016 1005   BASOSABS 0.0 07/22/2018 1250   BASOSABS 0.1 07/20/2016 1005   Iron/TIBC/Ferritin/ %Sat No results found for: IRON, TIBC, FERRITIN, IRONPCTSAT Lipid Panel     Component Value Date/Time   CHOL 152 07/28/2018 0920   TRIG 89 07/28/2018 0920   HDL 37 (L) 07/28/2018 0920   LDLCALC 97 07/28/2018 0920   Hepatic Function Panel       Component Value Date/Time   PROT 6.6 07/28/2018 0920   PROT 7.0 07/20/2016 1005   ALBUMIN 4.4 07/28/2018 0920   ALBUMIN 3.7 07/20/2016 1005   AST 15 07/28/2018 0920   AST 15 07/22/2018 1250   AST 17 07/20/2016 1005   ALT 9 07/28/2018 0920   ALT 9 07/22/2018 1250   ALT 14 07/20/2016 1005   ALKPHOS 121 (H) 07/28/2018 0920   ALKPHOS 131 07/20/2016 1005   BILITOT 0.6 07/28/2018 0920   BILITOT 0.5 07/22/2018 1250   BILITOT 0.56 07/20/2016 1005      Component Value Date/Time   TSH 0.555 02/25/2018 0000   TSH 0.48 02/23/2016 1009    OBESITY BEHAVIORAL INTERVENTION VISIT DOCUMENTATION FOR INSURANCE (~15 minutes)  I, Michaelene Song, am acting as Location manager for [employer], DO  I have reviewed the above documentation for accuracy and completeness, and I agree with the above. Jearld Lesch, DO

## 2019-01-08 ENCOUNTER — Other Ambulatory Visit: Payer: Self-pay

## 2019-01-08 ENCOUNTER — Encounter (INDEPENDENT_AMBULATORY_CARE_PROVIDER_SITE_OTHER): Payer: Self-pay | Admitting: Bariatrics

## 2019-01-08 ENCOUNTER — Telehealth (INDEPENDENT_AMBULATORY_CARE_PROVIDER_SITE_OTHER): Payer: Medicare Other | Admitting: Bariatrics

## 2019-01-08 DIAGNOSIS — E669 Obesity, unspecified: Secondary | ICD-10-CM

## 2019-01-08 DIAGNOSIS — Z6832 Body mass index (BMI) 32.0-32.9, adult: Secondary | ICD-10-CM | POA: Diagnosis not present

## 2019-01-08 DIAGNOSIS — E8881 Metabolic syndrome: Secondary | ICD-10-CM

## 2019-01-08 DIAGNOSIS — E559 Vitamin D deficiency, unspecified: Secondary | ICD-10-CM

## 2019-01-15 NOTE — Progress Notes (Signed)
TeleHealth Visit:  Due to the COVID-19 pandemic, this visit was completed with telemedicine (audio/video) technology to reduce patient and provider exposure as well as to preserve personal protective equipment.   Gabriella Walker has verbally consented to this TeleHealth visit. The patient is located at home, the provider is located at the [employer] [facility] office. The participants in this visit include the listed provider and patient. The visit was conducted today via telephone call ( 20 minutes ).   Gabriella Walker was unable to use realtime audiovisual technology today and the telehealth visit was conducted via telephone.  Chief Complaint: OBESITY Gabriella Walker is here to discuss her progress with her obesity treatment plan along with follow-up of her obesity related diagnoses. Gabriella Walker is on the Category 1 Plan and states she is following her eating plan approximately 50% of the time. Gabriella Walker states she is exercising 0 minutes 0 times per week.  Today's visit was #: 16 Starting weight: 193 lbs Starting date: 02/25/2018  Interim History: Gabriella Walker gained 4 lbs since her last visit (weight 172). She typically follows the plan more closely. She states she ate more carbohydrates and sweets.  Subjective:   Vitamin D deficiency. Gabriella Walker is taking high dose Vitamin D.   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 She continues to work on diet and exercise to decrease her risk of diabetes. No polyphagia.  Lab Results  Component Value Date   INSULIN 12.7 07/28/2018   INSULIN 9.7 02/25/2018   Lab Results  Component Value Date   HGBA1C 5.7 (H) 07/28/2018    Assessment/Plan:   Vitamin D deficiency. Low Vitamin D level contributes to fatigue and are associated with obesity, breast, and colon cancer. She agrees to continue taking high dose Vitamin D and will revert to OTC use after she finishes her current prescription. She  will follow-up for routine testing of vitamin D, at least 2-3 times per year to avoid over-replacement.  Insulin resistance. Gabriella Walker will continue to work on weight loss, exercise, increasing protein and healthy fats, and decreasing simple carbohydrates to help decrease the risk of diabetes. Gabriella Walker agreed to follow-up with [place] as directed to closely monitor her progress.  Class 1 obesity with serious comorbidity and body mass index (BMI) of 32.0 to 32.9 in adult, unspecified obesity type.  Gabriella Walker is currently in the action stage of change. As such, her goal is to continue with weight loss efforts. She has agreed to the Category 1 Plan.   She will work on meal planning, intentional eating, will remove all sweets from the home, and will be more adherent to the plan.  We discussed the following exercise goals today: [place] will walk as needed (use Bio Freeze).  We discussed the following behavioral modification strategies today: increasing lean protein intake, decreasing simple carbohydrates, increasing vegetables, increasing water intake, decreasing eating out, no skipping meals, meal planning and cooking strategies, keeping healthy foods in the home and planning for success.  Gabriella Walker has agreed to follow-up with our clinic in 2-3 weeks. She was informed of the importance of frequent follow-up visits to maximize her success with intensive lifestyle modifications for her multiple health conditions.  Objective:   VITALS: Per patient if applicable, see vitals. GENERAL: Alert and in no acute distress. CARDIOPULMONARY: No increased WOB. Speaking in clear sentences.  PSYCH: Pleasant and cooperative. Speech normal rate and rhythm. Affect is appropriate. Insight and judgement  are appropriate. Attention is focused, linear, and appropriate.  NEURO: Oriented as arrived to appointment on time with no prompting.   Lab Results  Component Value Date   CREATININE 0.83 07/28/2018   BUN 14 07/28/2018   NA  133 (L) 07/28/2018   K 4.6 07/28/2018   CL 91 (L) 07/28/2018   CO2 23 07/28/2018   Lab Results  Component Value Date   ALT 9 07/28/2018   AST 15 07/28/2018   ALKPHOS 121 (H) 07/28/2018   BILITOT 0.6 07/28/2018   Lab Results  Component Value Date   HGBA1C 5.7 (H) 07/28/2018   HGBA1C 6.0 (H) 02/25/2018   Lab Results  Component Value Date   INSULIN 12.7 07/28/2018   INSULIN 9.7 02/25/2018   Lab Results  Component Value Date   TSH 0.555 02/25/2018   Lab Results  Component Value Date   CHOL 152 07/28/2018   HDL 37 (L) 07/28/2018   LDLCALC 97 07/28/2018   TRIG 89 07/28/2018   Lab Results  Component Value Date   WBC 7.8 07/22/2018   HGB 13.6 07/22/2018   HCT 41.6 07/22/2018   MCV 86.3 07/22/2018   PLT 215 07/22/2018   No results found for: IRON, TIBC, FERRITIN  Attestation Statements:   Reviewed by clinician on day of visit: allergies, medications, problem list, medical history, surgical history, family history, social history, and previous encounter notes.  Migdalia Dk, am acting as Location manager for [employer], DO  I have reviewed the above documentation for accuracy and completeness, and I agree with the above. Jearld Lesch, DO

## 2019-01-20 ENCOUNTER — Ambulatory Visit: Attending: Family Medicine | Primary: Family Medicine

## 2019-01-20 ENCOUNTER — Ambulatory Visit: Admit: 2019-01-20 | Discharge: 2019-01-20 | Payer: MEDICARE | Attending: Family Medicine | Primary: Family Medicine

## 2019-01-20 DIAGNOSIS — M25561 Pain in right knee: Secondary | ICD-10-CM

## 2019-01-20 DIAGNOSIS — G8929 Other chronic pain: Secondary | ICD-10-CM

## 2019-01-20 MED ORDER — SOLIFENACIN 10 MG TAB
10 mg | ORAL_TABLET | Freq: Every day | ORAL | 5 refills | Status: DC
Start: 2019-01-20 — End: 2019-05-01

## 2019-01-20 NOTE — Progress Notes (Signed)
SUBJECTIVE:   Brandy Vargas is a 79 y.o. female who presents today as a new patient requesting access to healthcare.  Patient reports she recently moved from [facility] to be closer to family.  She reports ongoing struggles with bilateral knee pain and degenerative arthritis.  She is requesting a handicap placard for [place] stating she had one in [place].  In addition she is requesting a referral to orthopedist locally.  She denies chest pain, shortness of breath, orthopnea or PND.  She does report that her overactive bladder medicine, Myrbetriq, is not working.  She would like to try something different.    PMH.  Hypertension, A. fib, overactive bladder, DJD and right breast cancer    PSH.  Appendectomy, right lumpectomy, right knee arthroscopy    Allergies.  Prilosec causes itching    Social.  Denies tobacco or alcohol use.  Patient is widowed.    Current medicines.  Listed in the EMR and reviewed today.    Family history.  Father died of an MI.  Mother died of natural causes.  Had high blood pressure.  1 sister with diabetes.    HPI  See above    Past Medical History, Past Surgical History, Family history, Social History, and Medications were all reviewed with the patient today and updated as necessary.       Current Outpatient Medications   Medication Sig Dispense Refill   ??? solifenacin (VESICARE) 10 mg tablet Take 1 Tab by mouth daily. 30 Tab 5   ??? Eliquis 5 mg tablet TAKE 1 TABLET BY MOUTH TWICE DAILY     ??? metoprolol tartrate (LOPRESSOR) 50 mg tablet TAKE 1 TABLET BY MOUTH TWICE DAILY     ??? telmisartan (MICARDIS) 80 mg tablet TAKE 1 TABLET BY MOUTH ONCE DAILY     ??? simvastatin (ZOCOR) 20 mg tablet TAKE 1 TABLET BY MOUTH ONCE DAILY       Allergies   Allergen Reactions   ??? Omeprazole Magnesium Itching     There is no problem list on file for this patient.    Past Medical History:   Diagnosis Date   ??? Atrial fibrillation ([place])    ??? Hypertension    ??? Overactive bladder       Past Surgical History:   Procedure Laterality Date   ??? HX APPENDECTOMY     ??? HX ORTHOPAEDIC Left 2001    knee surgery-orthroscopic   ??? HX TUBAL LIGATION  1976   ??? 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 Hypertension Mother    ??? High Cholesterol Mother    ??? Heart Attack Father    ??? Diabetes Sister    ??? Diabetes Paternal Grandmother      Social History     Tobacco Use   ??? Smoking status: Never Smoker   ??? Smokeless tobacco: Never Used   Substance Use Topics   ??? Alcohol use: Never     Frequency: Never         Review of Systems  See above    OBJECTIVE:  Visit Vitals  BP 118/66   Ht 5\' 3"  (1.6 m)   Wt 171 lb 3.2 oz (77.7 kg)   BMI 30.33 kg/m??        Physical Exam  Constitutional:       Appearance: She is well-developed.   HENT:  Head: Normocephalic and atraumatic.   Eyes:      Pupils: Pupils are equal, round, and reactive to light.   Neck:      Musculoskeletal: Normal range of motion and neck supple.      Thyroid: No thyromegaly.      Vascular: No JVD.   Cardiovascular:      Rate and Rhythm: Normal rate and regular rhythm.      Heart sounds: Normal heart sounds. No murmur. No friction rub. No gallop.    Pulmonary:      Effort: Pulmonary effort is normal. No respiratory distress.      Breath sounds: No wheezing or rales.   Abdominal:      Palpations: Abdomen is soft.      Tenderness: There is no abdominal tenderness. There is no guarding or rebound.   Musculoskeletal: Normal range of motion.   Skin:     Findings: No rash.   Neurological:      Mental Status: She is alert and oriented to person, place, and time.         Medical problems and test results were reviewed with the patient today.         ASSESSMENT and PLAN    1.  Bilateral knee pain.  Handicap placard provided.  Refer to orthopedist.    2.  Hypertension.  Blood pressure is 118/66.  Set up for physical to include labs.     3.  Atrial fibrillation.  No syncope or presyncope is reported.  Continue anticoagulation.  Refer to get the patient established with local cardiology.    4.  Overactive bladder.  Discontinue Myrbetriq.  Try her on Vesicare 10 mg a day.    Elements of this note have been dictated using speech recognition software. As a result, errors of speech recognition may have occurred.

## 2019-01-20 NOTE — Progress Notes (Signed)
SUBJECTIVE:   Brandy Vargas is a 79 y.o. female who presents today as a new patient requesting access to healthcare.  Patient reports she recently moved from [facility] to be closer to family.  She reports ongoing struggles with bilateral knee pain and degenerative arthritis.  She is requesting a handicap placard for [place] stating she had one in [place].  In addition she is requesting a referral to orthopedist locally.  She denies chest pain, shortness of breath, orthopnea or PND.  She does report that her overactive bladder medicine, Myrbetriq, is not working.  She would like to try something different.    PMH.  Hypertension, A. fib, overactive bladder, DJD and right breast cancer    PSH.  Appendectomy, right lumpectomy, right knee arthroscopy    Allergies.  Prilosec causes itching    Social.  Denies tobacco or alcohol use.  Patient is widowed.    Current medicines.  Listed in the EMR and reviewed today.    Family history.  Father died of an MI.  Mother died of natural causes.  Had high blood pressure.  1 sister with diabetes.    HPI  See above    Past Medical History, Past Surgical History, Family history, Social History, and Medications were all reviewed with the patient today and updated as necessary.       Current Outpatient Medications   Medication Sig Dispense Refill   ??? solifenacin (VESICARE) 10 mg tablet Take 1 Tab by mouth daily. 30 Tab 5   ??? Eliquis 5 mg tablet TAKE 1 TABLET BY MOUTH TWICE DAILY     ??? metoprolol tartrate (LOPRESSOR) 50 mg tablet TAKE 1 TABLET BY MOUTH TWICE DAILY     ??? telmisartan (MICARDIS) 80 mg tablet TAKE 1 TABLET BY MOUTH ONCE DAILY     ??? simvastatin (ZOCOR) 20 mg tablet TAKE 1 TABLET BY MOUTH ONCE DAILY       Allergies   Allergen Reactions   ??? Omeprazole Magnesium Itching     There is no problem list on file for this patient.    Past Medical History:   Diagnosis Date   ??? Atrial fibrillation ([place])    ??? Hypertension    ??? Overactive bladder      Past  Surgical History:   Procedure Laterality Date   ??? HX APPENDECTOMY     ??? HX ORTHOPAEDIC Left 2001    knee surgery-orthroscopic   ??? HX TUBAL LIGATION  1976   ??? 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 Hypertension Mother    ??? High Cholesterol Mother    ??? Heart Attack Father    ??? Diabetes Sister    ??? Diabetes Paternal Grandmother      Social History     Tobacco Use   ??? Smoking status: Never Smoker   ??? Smokeless tobacco: Never Used   Substance Use Topics   ??? Alcohol use: Never     Frequency: Never         Review of Systems  See above    OBJECTIVE:  Visit Vitals  BP 118/66   Ht 5\' 3"  (1.6 m)   Wt 171 lb 3.2 oz (77.7 kg)   BMI 30.33 kg/m??        Physical Exam  Constitutional:       Appearance: She is well-developed.   HENT:  Head: Normocephalic and atraumatic.   Eyes:      Pupils: Pupils are equal, round, and reactive to light.   Neck:      Musculoskeletal: Normal range of motion and neck supple.      Thyroid: No thyromegaly.      Vascular: No JVD.   Cardiovascular:      Rate and Rhythm: Normal rate and regular rhythm.      Heart sounds: Normal heart sounds. No murmur. No friction rub. No gallop.    Pulmonary:      Effort: Pulmonary effort is normal. No respiratory distress.      Breath sounds: No wheezing or rales.   Abdominal:      Palpations: Abdomen is soft.      Tenderness: There is no abdominal tenderness. There is no guarding or rebound.   Musculoskeletal: Normal range of motion.   Skin:     Findings: No rash.   Neurological:      Mental Status: She is alert and oriented to person, place, and time.         Medical problems and test results were reviewed with the patient today.         ASSESSMENT and PLAN    1.  Bilateral knee pain.  Handicap placard provided.  Refer to orthopedist.    2.  Hypertension.  Blood pressure is 118/66.  Set up for physical to include labs.    3.  Atrial fibrillation.  No syncope or presyncope is reported.  Continue  anticoagulation.  Refer to get the patient established with local cardiology.    4.  Overactive bladder.  Discontinue Myrbetriq.  Try her on Vesicare 10 mg a day.    Elements of this note have been dictated using speech recognition software. As a result, errors of speech recognition may have occurred.

## 2019-01-22 ENCOUNTER — Other Ambulatory Visit: Payer: Self-pay

## 2019-01-22 ENCOUNTER — Encounter (INDEPENDENT_AMBULATORY_CARE_PROVIDER_SITE_OTHER): Payer: Self-pay | Admitting: Bariatrics

## 2019-01-22 ENCOUNTER — Telehealth (INDEPENDENT_AMBULATORY_CARE_PROVIDER_SITE_OTHER): Payer: Medicare Other | Admitting: Bariatrics

## 2019-01-22 DIAGNOSIS — Z6834 Body mass index (BMI) 34.0-34.9, adult: Secondary | ICD-10-CM | POA: Diagnosis not present

## 2019-01-22 DIAGNOSIS — E559 Vitamin D deficiency, unspecified: Secondary | ICD-10-CM

## 2019-01-22 DIAGNOSIS — E7849 Other hyperlipidemia: Secondary | ICD-10-CM

## 2019-01-22 DIAGNOSIS — E669 Obesity, unspecified: Secondary | ICD-10-CM

## 2019-01-26 NOTE — Progress Notes (Signed)
TeleHealth Visit:  Due to the COVID-19 pandemic, this visit was completed with telemedicine (audio/video) technology to reduce patient and provider exposure as well as to preserve personal protective equipment.   Gabriella Walker has verbally consented to this TeleHealth visit. The patient is located at home, the provider is located at the [facility] office. The participants in this visit include the listed provider and patient. Gabriella Walker was unable to use realtime audiovisual technology today and the telehealth visit was conducted via telephone.  Chief Complaint: OBESITY Gabriella Walker is here to discuss her progress with her obesity treatment plan along with follow-up of her obesity related diagnoses. Gabriella Walker is on the Category 1 Plan and states she is following her eating plan approximately 80% of the time. Aeryal states she is doing 0 minutes 0 times per week.  Today's visit was #: 17 Starting weight: 193 lbs Starting date: 02/25/2018  Interim History: Gabriella Walker is down and additional 3 lbs. Her knees are hurting more over the last few weeks. She is doing better with meat and vegetables.  Subjective:   1. Vitamin D deficiency Gabriella Walker is taking Vit D OTC 2,000 IU daily.  2. Other hyperlipidemia Gabriella Walker is taking Zocor.  Assessment/Plan:   1. Vitamin D deficiency Low Vitamin D level contributes to fatigue and are associated with obesity, breast, and colon cancer. Gabriella Walker will continue OTC Vit D and will follow-up for routine testing of Vitamin D, at least 2-3 times per year to avoid over-replacement. She is to follow up with her primary care physician.  2. Other hyperlipidemia Cardiovascular risk and specific lipid/LDL goals reviewed. We discussed several lifestyle modifications today. Gabriella Walker will continue taking Zocor, and will continue to work on diet, exercise and weight loss efforts, and decreasing saturated fats. Orders and follow up as documented in patient record.   Counseling Intensive  lifestyle modifications are the first line treatment for this issue. . Dietary changes: Increase soluble fiber. Decrease simple carbohydrates. . Exercise changes: Moderate to vigorous-intensity aerobic activity 150 minutes per week if tolerated. . Lipid-lowering medications: see documented in medical record.  3. Class 1 obesity with serious comorbidity and body mass index (BMI) of 34.0 to 34.9 in adult, unspecified obesity type Gabriella Walker is currently in the action stage of change. As such, her goal is to continue with weight loss efforts. She has agreed to the Category 1 Plan.   Exercise goals: Gabriella Walker will resume walking.  Behavioral modification strategies: increasing lean protein intake, decreasing simple carbohydrates, increasing vegetables, increasing water intake, decreasing eating out, no skipping meals, meal planning and cooking strategies, keeping healthy foods in the home and planning for success.  Gabriella Walker has agreed to follow-up with our clinic in 2 weeks. She was informed of the importance of frequent follow-up visits to maximize her success with intensive lifestyle modifications for her multiple health conditions.  Objective:   VITALS: Per patient if applicable, see vitals. GENERAL: Alert and in no acute distress. CARDIOPULMONARY: No increased WOB. Speaking in clear sentences.  PSYCH: Pleasant and cooperative. Speech normal rate and rhythm. Affect is appropriate. Insight and judgement are appropriate. Attention is focused, linear, and appropriate.  NEURO: Oriented as arrived to appointment on time with no prompting.   Lab Results  Component Value Date   CREATININE 0.83 07/28/2018   BUN 14 07/28/2018   NA 133 (L) 07/28/2018   K 4.6 07/28/2018   CL 91 (L) 07/28/2018   CO2 23 07/28/2018   Lab Results  Component Value Date  ALT 9 07/28/2018   AST 15 07/28/2018   ALKPHOS 121 (H) 07/28/2018   BILITOT 0.6 07/28/2018   Lab Results  Component Value Date   HGBA1C 5.7 (H)  07/28/2018   HGBA1C 6.0 (H) 02/25/2018   Lab Results  Component Value Date   INSULIN 12.7 07/28/2018   INSULIN 9.7 02/25/2018   Lab Results  Component Value Date   TSH 0.555 02/25/2018   Lab Results  Component Value Date   CHOL 152 07/28/2018   HDL 37 (L) 07/28/2018   LDLCALC 97 07/28/2018   TRIG 89 07/28/2018   Lab Results  Component Value Date   WBC 7.8 07/22/2018   HGB 13.6 07/22/2018   HCT 41.6 07/22/2018   MCV 86.3 07/22/2018   PLT 215 07/22/2018   No results found for: IRON, TIBC, FERRITIN  Attestation Statements:   Reviewed by clinician on day of visit: allergies, medications, problem list, medical history, surgical history, family history, social history, and previous encounter notes.  Time spent on visit including pre-visit chart review and post-visit care was 20 minutes.   Wilhemena Durie, am acting as Location manager for [employer], DO.  I have reviewed the above documentation for accuracy and completeness, and I agree with the above. Jearld Lesch, DO

## 2019-02-02 MED ORDER — TELMISARTAN 80 MG TAB
80 mg | ORAL_TABLET | ORAL | 5 refills | Status: DC
Start: 2019-02-02 — End: 2019-05-01

## 2019-02-02 MED ORDER — METOPROLOL TARTRATE 50 MG TAB
50 mg | ORAL_TABLET | ORAL | 5 refills | Status: DC
Start: 2019-02-02 — End: 2019-05-01

## 2019-02-03 ENCOUNTER — Ambulatory Visit: Attending: Cardiovascular Disease | Primary: Family Medicine

## 2019-02-03 ENCOUNTER — Ambulatory Visit
Admit: 2019-02-03 | Discharge: 2019-02-03 | Payer: MEDICARE | Attending: Cardiovascular Disease | Primary: Family Medicine

## 2019-02-03 DIAGNOSIS — I499 Cardiac arrhythmia, unspecified: Secondary | ICD-10-CM

## 2019-02-03 NOTE — Progress Notes (Addendum)
[place] [place]  [address], [address]  [place] [address]  PHONE: [phone]    SUBJECTIVE: Brandy Vargas ([birth year]) is a 79 y.o. female seen for a follow up visit regarding the following:        ICD-10-CM ICD-9-CM   1. Irregular heart rate  I49.9 427.9   2. Paroxysmal atrial fibrillation (HCC)  I48.0 427.31   3. Dyslipidemia  E78.5 272.4     Reports that she just moved here from [place] a few months ago and is setting up healthcare providers. She was referred to [facility] by her PCP Dr. Lucia Gaskins. She states that she has been seen in the past by cardiology due to infrequent episodes of atrial fibrillation. She is symptomatic in atrial fibrillation and feels palpitations. She denies syncopal episodes or previous need for cardioversion. She takes Eliquis and Metoprolol tartrate (newly added in [place] - managed by PCP) daily. She reports that she takes diltiazem only when her heart rate goes >100 bpm. She also reports a Hx of HTN (typically 140/80) for which she is also on medication, and an overactive bladder which is well-managed.          Past Medical History, Past Surgical History, Family history, Social History, and Medications were all reviewed with the patient today and updated as necessary.    Outpatient Medications Marked as Taking for the 02/03/19 encounter (Office Visit) with Bittrick, Orland Penman, MD   Medication Sig Dispense Refill   ??? dilTIAZem IR (CARDIZEM) 30 mg tablet Take 1 tablet po prn q 4h for afib. Heart rate >100     ??? telmisartan (MICARDIS) 80 mg tablet TAKE 1 TABLET BY MOUTH ONCE DAILY 30 Tab 5   ??? metoprolol tartrate (LOPRESSOR) 50 mg tablet TAKE 1 TABLET BY MOUTH TWICE DAILY 30 Tab 5   ??? Eliquis 5 mg tablet TAKE 1 TABLET BY MOUTH TWICE DAILY     ??? simvastatin (ZOCOR) 20 mg tablet TAKE 1 TABLET BY MOUTH ONCE DAILY     ??? solifenacin (VESICARE) 10 mg tablet Take 1 Tab by mouth daily. 30 Tab 5     Allergies   Allergen Reactions   ??? Omeprazole Magnesium Itching      Past Medical History:   Diagnosis Date   ??? Atrial fibrillation ([place])    ??? Hypertension    ??? Overactive bladder      Past Surgical History:   Procedure Laterality Date   ??? HX APPENDECTOMY     ??? HX ORTHOPAEDIC Left 2001    knee surgery-orthroscopic   ??? HX TUBAL LIGATION  1976   ??? 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 Hypertension Mother    ??? High Cholesterol Mother    ??? Heart Attack Father    ??? Diabetes Sister    ??? Diabetes Paternal Grandmother       Social History     Tobacco Use   ??? Smoking status: Never Smoker   ??? Smokeless tobacco: Never Used   Substance Use Topics   ??? Alcohol use: Never     Frequency: Never     @JBFAM @    Current Outpatient Medications:   ???  dilTIAZem IR (CARDIZEM) 30 mg tablet, Take 1 tablet po prn q 4h for afib. Heart rate >100, Disp: , Rfl:   ???  telmisartan (MICARDIS) 80 mg tablet, TAKE 1 TABLET BY  MOUTH ONCE DAILY, Disp: 30 Tab, Rfl: 5  ???  metoprolol tartrate (LOPRESSOR) 50 mg tablet, TAKE 1 TABLET BY MOUTH TWICE DAILY, Disp: 30 Tab, Rfl: 5  ???  Eliquis 5 mg tablet, TAKE 1 TABLET BY MOUTH TWICE DAILY, Disp: , Rfl:   ???  simvastatin (ZOCOR) 20 mg tablet, TAKE 1 TABLET BY MOUTH ONCE DAILY, Disp: , Rfl:   ???  solifenacin (VESICARE) 10 mg tablet, Take 1 Tab by mouth daily., Disp: 30 Tab, Rfl: 5        ROS:  Review of Systems - General ROS: negative for - chills, fatigue or fever  Hematological and Lymphatic ROS: negative for - bleeding problems, bruising or jaundice  Respiratory ROS: no cough, shortness of breath, or wheezing  Cardiovascular ROS: no chest pain or dyspnea on exertion  Gastrointestinal ROS: no abdominal pain, change in bowel habits, or black or bloody stools  Neurological ROS: no TIA or stroke symptoms  All other systems negative.      Wt Readings from Last 3 Encounters:   02/03/19 170 lb (77.1 kg)   01/20/19 171 lb 3.2 oz (77.7 kg)     Temp Readings from Last 3 Encounters:   No data found for Temp      BP Readings from Last 3 Encounters:   02/03/19 (!) 142/66   01/20/19 118/66     Pulse Readings from Last 3 Encounters:   02/03/19 (!) 51           PHYSICAL EXAM:  Visit Vitals  BP (!) 142/66   Pulse (!) 51   Ht 5\' 3"  (1.6 m)   Wt 170 lb (77.1 kg)   BMI 30.11 kg/m??       Physical Examination: General appearance - alert, well appearing, and in no distress  Mental status - alert, oriented to person, place, and time  Eyes - pupils equal and reactive, extraocular eye movements intact  Neck/lymph - supple, no significant adenopathy  Chest/lungs - clear to auscultation, no wheezes, rales or rhonchi, symmetric air entry  Heart/CV - normal rate, regular rhythm, normal S1, S2, no murmurs, rubs, clicks or gallops  Abdomen/GI - soft, nontender, nondistended, no masses or organomegaly  Musculoskeletal - no joint tenderness, deformity or swelling  Extremities - peripheral pulses normal, no pedal edema, no clubbing or cyanosis  Skin - normal coloration and turgor, no rashes, no suspicious skin lesions noted    EKG: normal EKG, normal sinus rhythm, unchanged from previous tracings.                   Medications reviewed and questions answered    No results found for this or any previous visit (from the past 672 hour(s)).  No results found for: CHOL, CHOLPOCT, CHOLX, CHLST, CHOLV, HDL, HDLPOC, HDLP, LDL, LDLCPOC, LDLC, DLDLP, VLDLC, VLDL, TGLX, TRIGL, TRIGP, TGLPOCT, CHHD, CHHDX      ASSESSMENT and PLAN        1. Irregular heart rate  Patient has stable cardiac symptoms and findings. Continue meds as discussed. Follow appropriate dietary, lifestyle and risk factor modifications.    - AMB POC EKG ROUTINE W/ 12 LEADS, INTER & REP    2. Paroxysmal atrial fibrillation (HCC)  The current medical regimen is effective;  continue present plan and medications.    3. Dyslipidemia  The current medical regimen is effective;  continue present plan and medications.    Consider referral for exercise programs covered by insurance.     I have  reviewed this note in its entirety and edited where appropriate. I have performed an independent exam and/or observed the history taking. All portions of the E/M have been edited and reviewed prior to signing note.    Pt has atrial fibrillation and is following a rate   control and anticoagulation management protocol. CrCl cannot be calculated (No successful lab value found.). appropriate doses of meds have been reviewed.  Pt has been started on [facility] therapy or is currently taking OAC therapy. Pt given instructions for use and also provided access for patient education material about medication and its uses, potential benefits and potential side effects and complications.              Orders Placed This Encounter   ??? AMB POC EKG ROUTINE W/ 12 LEADS, INTER & REP     Order Specific Question:   Reason for Exam:     Answer:   See diagnosis   ??? dilTIAZem IR (CARDIZEM) 30 mg tablet     Sig: Take 1 tablet po prn q 4h for afib. Heart rate >100       Follow-up and Dispositions    ?? Return in about 6 months (around 08/03/2019).               Hilliard Clark, MD  02/03/2019  10:07 AM

## 2019-02-03 NOTE — Progress Notes (Signed)
[facility]  [address], [address]  [place], [place] [address]  PHONE: [phone]    SUBJECTIVE: Brandy Vargas ([birth year]) is a 79 y.o. female seen for a follow up visit regarding the following:        ICD-10-CM ICD-9-CM   1. Irregular heart rate  I49.9 427.9   2. Paroxysmal atrial fibrillation (HCC)  I48.0 427.31   3. Dyslipidemia  E78.5 272.4     Reports that she just moved here from [place] a few months ago and is setting up healthcare providers. She was referred to [facility] by her PCP Dr. Ezzard Standing. She states that she has been seen in the past by cardiology due to infrequent episodes of atrial fibrillation. She is symptomatic in atrial fibrillation and feels palpitations. She denies syncopal episodes or previous need for cardioversion. She takes Eliquis and Metoprolol tartrate (newly added in [place] - managed by PCP) daily. She reports that she takes diltiazem only when her heart rate goes >100 bpm. She also reports a Hx of HTN (typically 140/80) for which she is also on medication, and an overactive bladder which is well-managed.          Past Medical History, Past Surgical History, Family history, Social History, and Medications were all reviewed with the patient today and updated as necessary.    Outpatient Medications Marked as Taking for the 02/03/19 encounter (Office Visit) with Bittrick, Karie Kirks, MD   Medication Sig Dispense Refill   . dilTIAZem IR (CARDIZEM) 30 mg tablet Take 1 tablet po prn q 4h for afib. Heart rate >100     . telmisartan (MICARDIS) 80 mg tablet TAKE 1 TABLET BY MOUTH ONCE DAILY 30 Tab 5   . metoprolol tartrate (LOPRESSOR) 50 mg tablet TAKE 1 TABLET BY MOUTH TWICE DAILY 30 Tab 5   . Eliquis 5 mg tablet TAKE 1 TABLET BY MOUTH TWICE DAILY     . simvastatin (ZOCOR) 20 mg tablet TAKE 1 TABLET BY MOUTH ONCE DAILY     . solifenacin (VESICARE) 10 mg tablet Take 1 Tab by mouth daily. 30 Tab 5     Allergies   Allergen Reactions   . Omeprazole Magnesium Itching     Past Medical  History:   Diagnosis Date   . Atrial fibrillation (HCC)    . Hypertension    . Overactive bladder      Past Surgical History:   Procedure Laterality Date   . HX APPENDECTOMY     . HX ORTHOPAEDIC Left 2001    knee surgery-orthroscopic   . HX TUBAL LIGATION  1976   . 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 Hypertension Mother    . High Cholesterol Mother    . Heart Attack Father    . Diabetes Sister    . Diabetes Paternal Grandmother       Social History     Tobacco Use   . Smoking status: Never Smoker   . Smokeless tobacco: Never Used   Substance Use Topics   . Alcohol use: Never     Frequency: Never     @JBFAM @    Current Outpatient Medications:   .  dilTIAZem IR (CARDIZEM) 30 mg tablet, Take 1 tablet po prn q 4h for afib. Heart rate >100, Disp: , Rfl:   .  telmisartan (MICARDIS) 80 mg tablet, TAKE 1 TABLET BY  MOUTH ONCE DAILY, Disp: 30 Tab, Rfl: 5  .  metoprolol tartrate (LOPRESSOR) 50 mg tablet, TAKE 1 TABLET BY MOUTH TWICE DAILY, Disp: 30 Tab, Rfl: 5  .  Eliquis 5 mg tablet, TAKE 1 TABLET BY MOUTH TWICE DAILY, Disp: , Rfl:   .  simvastatin (ZOCOR) 20 mg tablet, TAKE 1 TABLET BY MOUTH ONCE DAILY, Disp: , Rfl:   .  solifenacin (VESICARE) 10 mg tablet, Take 1 Tab by mouth daily., Disp: 30 Tab, Rfl: 5        ROS:  Review of Systems - General ROS: negative for - chills, fatigue or fever  Hematological and Lymphatic ROS: negative for - bleeding problems, bruising or jaundice  Respiratory ROS: no cough, shortness of breath, or wheezing  Cardiovascular ROS: no chest pain or dyspnea on exertion  Gastrointestinal ROS: no abdominal pain, change in bowel habits, or black or bloody stools  Neurological ROS: no TIA or stroke symptoms  All other systems negative.      Wt Readings from Last 3 Encounters:   02/03/19 170 lb (77.1 kg)   01/20/19 171 lb 3.2 oz (77.7 kg)     Temp Readings from Last 3 Encounters:   No data found for Temp     BP Readings from Last 3  Encounters:   02/03/19 (!) 142/66   01/20/19 118/66     Pulse Readings from Last 3 Encounters:   02/03/19 (!) 51           PHYSICAL EXAM:  Visit Vitals  BP (!) 142/66   Pulse (!) 51   Ht 5\' 3"  (1.6 m)   Wt 170 lb (77.1 kg)   BMI 30.11 kg/m       Physical Examination: General appearance - alert, well appearing, and in no distress  Mental status - alert, oriented to person, place, and time  Eyes - pupils equal and reactive, extraocular eye movements intact  Neck/lymph - supple, no significant adenopathy  Chest/lungs - clear to auscultation, no wheezes, rales or rhonchi, symmetric air entry  Heart/CV - normal rate, regular rhythm, normal S1, S2, no murmurs, rubs, clicks or gallops  Abdomen/GI - soft, nontender, nondistended, no masses or organomegaly  Musculoskeletal - no joint tenderness, deformity or swelling  Extremities - peripheral pulses normal, no pedal edema, no clubbing or cyanosis  Skin - normal coloration and turgor, no rashes, no suspicious skin lesions noted    EKG: normal EKG, normal sinus rhythm, unchanged from previous tracings.                   Medications reviewed and questions answered    No results found for this or any previous visit (from the past 672 hour(s)).  No results found for: CHOL, CHOLPOCT, CHOLX, CHLST, CHOLV, HDL, HDLPOC, HDLP, LDL, LDLCPOC, LDLC, DLDLP, VLDLC, VLDL, TGLX, TRIGL, TRIGP, TGLPOCT, CHHD, CHHDX      ASSESSMENT and PLAN        1. Irregular heart rate  Patient has stable cardiac symptoms and findings. Continue meds as discussed. Follow appropriate dietary, lifestyle and risk factor modifications.    - AMB POC EKG ROUTINE W/ 12 LEADS, INTER & REP    2. Paroxysmal atrial fibrillation (HCC)  The current medical regimen is effective;  continue present plan and medications.    3. Dyslipidemia  The current medical regimen is effective;  continue present plan and medications.    Consider referral for exercise programs covered by insurance.    I have reviewed  this note in its  entirety and edited where appropriate. I have performed an independent exam and/or observed the history taking. All portions of the E/M have been edited and reviewed prior to signing note.    Pt has atrial fibrillation and is following a rate   control and anticoagulation management protocol. CrCl cannot be calculated (No successful lab value found.). appropriate doses of meds have been reviewed.  Pt has been started on [facility] therapy or is currently taking OAC therapy. Pt given instructions for use and also provided access for patient education material about medication and its uses, potential benefits and potential side effects and complications.              Orders Placed This Encounter   . AMB POC EKG ROUTINE W/ 12 LEADS, INTER & REP     Order Specific Question:   Reason for Exam:     Answer:   See diagnosis   . dilTIAZem IR (CARDIZEM) 30 mg tablet     Sig: Take 1 tablet po prn q 4h for afib. Heart rate >100       Follow-up and Dispositions     Return in about 6 months (around 08/03/2019).               Hilliard Clark, MD  02/03/2019  10:07 AM

## 2019-02-10 ENCOUNTER — Telehealth (INDEPENDENT_AMBULATORY_CARE_PROVIDER_SITE_OTHER): Payer: Medicare Other | Admitting: Bariatrics

## 2019-02-10 ENCOUNTER — Encounter (INDEPENDENT_AMBULATORY_CARE_PROVIDER_SITE_OTHER): Payer: Self-pay | Admitting: Bariatrics

## 2019-02-10 DIAGNOSIS — E669 Obesity, unspecified: Secondary | ICD-10-CM

## 2019-02-10 DIAGNOSIS — E8881 Metabolic syndrome: Secondary | ICD-10-CM | POA: Diagnosis not present

## 2019-02-10 DIAGNOSIS — I10 Essential (primary) hypertension: Secondary | ICD-10-CM

## 2019-02-10 DIAGNOSIS — Z6832 Body mass index (BMI) 32.0-32.9, adult: Secondary | ICD-10-CM

## 2019-02-10 NOTE — Progress Notes (Signed)
TeleHealth Visit:  Due to the COVID-19 pandemic, this visit was completed with telemedicine (audio/video) technology to reduce patient and provider exposure as well as to preserve personal protective equipment.   Gabriella Walker has verbally consented to this TeleHealth visit. The patient is located at home, the provider is located at the [facility] office. The participants in this visit include the listed provider and patient. The visit was conducted today via telephone call.  Gabriella Walker was unable to use realtime audiovisual technology today and the telehealth visit was conducted via telephone.  Chief Complaint: OBESITY Gabriella Walker is here to discuss her progress with her obesity treatment plan along with follow-up of her obesity related diagnoses. Gabriella Walker is on the Category 1 Plan and states she is following her eating plan approximately 75% of the time. Gabriella Walker states she is walking 10 minutes 4 times per week.  Today's visit was #: 18 Starting weight: 193 lbs Starting date: 02/25/2018  Interim History: Gabriella Walker states that she may be up 1 lb (weight 167). She is having more knee pain and has an appointment with the orthopedist next week.   Subjective:   Essential hypertension. Blood pressure is reasonably well controlled.  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 She continues to work on diet and exercise to decrease her risk of diabetes. No polyphagia.  Lab Results  Component Value Date   INSULIN 12.7 07/28/2018   INSULIN 9.7 02/25/2018   Lab Results  Component Value Date   HGBA1C 5.7 (H) 07/28/2018   Assessment/Plan:   Essential hypertension. Gabriella Walker is working on healthy weight  loss and exercise to improve blood pressure control. We will watch for signs of hypotension as she continues her lifestyle modifications. She will continue her medications as prescribed.  Insulin resistance. Gabriella Walker will continue to work on weight loss, exercise, increasing healthy fats and protein, and decreasing simple carbohydrates to help decrease the risk of diabetes. Gabriella Walker agreed to follow-up with [place] as directed to closely monitor her progress.  Class 1 obesity with serious comorbidity and body mass index (BMI) of 32.0 to 32.9 in adult, unspecified obesity type.  Gabriella Walker is currently in the action stage of change. As such, her goal is to continue with weight loss efforts. She has agreed to the Category 1 Plan.   She will work on meal planning and intentional eating.  Exercise goals: Gabriella Walker will resume her walking.  Behavioral modification strategies: increasing lean protein intake, decreasing simple carbohydrates, increasing vegetables, increasing water intake, decreasing eating out, no skipping meals, meal planning and cooking strategies, keeping healthy foods in the home and planning for success.  Gabriella Walker has agreed to follow-up with our clinic in 2 weeks. She was informed of the importance of frequent follow-up visits to maximize her success with intensive lifestyle modifications for her multiple health conditions.  Objective:   VITALS: Per patient if applicable, see vitals. GENERAL: Alert and in no acute distress. CARDIOPULMONARY: No increased WOB. Speaking in clear sentences.  PSYCH: Pleasant and cooperative. Speech normal rate and rhythm. Affect is appropriate. Insight and judgement are appropriate. Attention is focused, linear, and appropriate.  NEURO: Oriented as arrived to appointment on time with no prompting.  Lab Results  Component Value Date   CREATININE 0.83 07/28/2018   BUN 14 07/28/2018   NA 133 (L) 07/28/2018   K 4.6 07/28/2018   CL 91 (L) 07/28/2018   CO2 23  07/28/2018   Lab Results  Component Value Date   ALT 9 07/28/2018   AST 15 07/28/2018   ALKPHOS 121 (H) 07/28/2018   BILITOT 0.6 07/28/2018   Lab Results  Component Value Date   HGBA1C 5.7 (H) 07/28/2018   HGBA1C 6.0 (H) 02/25/2018   Lab Results  Component Value Date   INSULIN 12.7 07/28/2018   INSULIN 9.7 02/25/2018   Lab Results  Component Value Date   TSH 0.555 02/25/2018   Lab Results  Component Value Date   CHOL 152 07/28/2018   HDL 37 (L) 07/28/2018   LDLCALC 97 07/28/2018   TRIG 89 07/28/2018   Lab Results  Component Value Date   WBC 7.8 07/22/2018   HGB 13.6 07/22/2018   HCT 41.6 07/22/2018   MCV 86.3 07/22/2018   PLT 215 07/22/2018   No results found for: IRON, TIBC, FERRITIN  Attestation Statements:   Reviewed by clinician on day of visit: allergies, medications, problem list, medical history, surgical history, family history, social history, and previous encounter notes.  Time spent on visit including pre-visit chart review and post-visit care was 20 minutes.   Migdalia Dk, am acting as Location manager for [employer], DO   I have reviewed the above documentation for accuracy and completeness, and I agree with the above. Jearld Lesch, DO

## 2019-02-24 ENCOUNTER — Encounter (INDEPENDENT_AMBULATORY_CARE_PROVIDER_SITE_OTHER): Payer: Self-pay | Admitting: Bariatrics

## 2019-02-24 ENCOUNTER — Telehealth (INDEPENDENT_AMBULATORY_CARE_PROVIDER_SITE_OTHER): Payer: Medicare Other | Admitting: Bariatrics

## 2019-02-24 ENCOUNTER — Other Ambulatory Visit: Payer: Self-pay

## 2019-02-24 DIAGNOSIS — I10 Essential (primary) hypertension: Secondary | ICD-10-CM | POA: Diagnosis not present

## 2019-02-24 DIAGNOSIS — E8881 Metabolic syndrome: Secondary | ICD-10-CM

## 2019-02-24 DIAGNOSIS — Z6833 Body mass index (BMI) 33.0-33.9, adult: Secondary | ICD-10-CM | POA: Diagnosis not present

## 2019-02-24 DIAGNOSIS — E669 Obesity, unspecified: Secondary | ICD-10-CM

## 2019-02-24 NOTE — Progress Notes (Signed)
TeleHealth Visit:  Due to the COVID-19 pandemic, this visit was completed with telemedicine (audio/video) technology to reduce patient and provider exposure as well as to preserve personal protective equipment.   Gabriella Walker has verbally consented to this TeleHealth visit. The patient is located at home, the provider is located at the [facility] office. The participants in this visit include the listed provider and patient. The visit was conducted today via telephone call (20 minutes).  Gabriella Walker was unable to use realtime audiovisual technology today and the telehealth visit was conducted via telephone.  Chief Complaint: OBESITY Gabriella Walker is here to discuss her progress with her obesity treatment plan along with follow-up of her obesity related diagnoses. Gabriella Walker is on the Category 1 Plan and states she is following her eating plan approximately 80% of the time. Gabriella Walker states she is walking 20 minutes 3 times per week.  Today's visit was #: 19 Starting weight: 193 lbs Starting date: 02/25/2018  Interim History: Gabriella Walker states that she has lost weight (weight 168). Her knees are doing better. She is going to the orthopedist. She reports getting adequate water, protein, and vegetables.  Subjective:   Essential hypertension. Gabriella Walker is taking Micardis and Cardizem.  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 She continues to work on diet and exercise to decrease her risk of diabetes. No polyphagia.  Lab Results  Component Value Date   INSULIN 12.7 07/28/2018   INSULIN 9.7 02/25/2018   Lab Results  Component Value Date   HGBA1C 5.7 (H) 07/28/2018   Assessment/Plan:   Essential  hypertension. Ninfa is working on healthy weight loss and exercise to improve blood pressure control. We will watch for signs of hypotension as she continues her lifestyle modifications. She will continue her medications as directed.  Insulin resistance. Gabriella Walker will continue to work on weight loss, exercise, increasing healthy fats and protein, and decreasing simple carbohydrates to help decrease the risk of diabetes. Gabriella Walker agreed to follow-up with [place] as directed to closely monitor her progress.  Class 1 obesity with serious comorbidity and body mass index (BMI) of 33.0 to 33.9 in adult, unspecified obesity type.  Gabriella Walker is currently in the action stage of change. As such, her goal is to continue with weight loss efforts. She has agreed to the Category 1 Plan.  She will work on meal planning, intentional eating, and increase variety in her diet.   Exercise goals: Gabriella Walker will continue to walk 20 minutes 3 days a week.  Behavioral modification strategies: increasing lean protein intake, decreasing simple carbohydrates, increasing vegetables, increasing water intake, decreasing eating out, no skipping meals, meal planning and cooking strategies and keeping healthy foods in the home.  Gabriella Walker has agreed to follow-up with our clinic in 2 weeks. She was informed of the importance of frequent follow-up visits to maximize her success with intensive lifestyle modifications for her multiple health conditions.  Objective:   VITALS: Per patient if applicable, see vitals. GENERAL: Alert and in no acute distress. CARDIOPULMONARY: No increased WOB. Speaking in clear sentences.  PSYCH: Pleasant and cooperative. Speech normal rate and rhythm. Affect is appropriate. Insight and judgement are appropriate. Attention is focused, linear, and appropriate.  NEURO: Oriented as arrived to appointment on time with no prompting.   Lab Results  Component Value Date   CREATININE 0.83 07/28/2018   BUN 14 07/28/2018   NA 133  (L) 07/28/2018   K 4.6 07/28/2018   CL 91 (L) 07/28/2018   CO2 23 07/28/2018   Lab Results  Component Value Date   ALT 9 07/28/2018   AST 15 07/28/2018   ALKPHOS 121 (H) 07/28/2018   BILITOT 0.6 07/28/2018   Lab Results  Component Value Date   HGBA1C 5.7 (H) 07/28/2018   HGBA1C 6.0 (H) 02/25/2018   Lab Results  Component Value Date   INSULIN 12.7 07/28/2018   INSULIN 9.7 02/25/2018   Lab Results  Component Value Date   TSH 0.555 02/25/2018   Lab Results  Component Value Date   CHOL 152 07/28/2018   HDL 37 (L) 07/28/2018   LDLCALC 97 07/28/2018   TRIG 89 07/28/2018   Lab Results  Component Value Date   WBC 7.8 07/22/2018   HGB 13.6 07/22/2018   HCT 41.6 07/22/2018   MCV 86.3 07/22/2018   PLT 215 07/22/2018   No results found for: IRON, TIBC, FERRITIN  Attestation Statements:   Reviewed by clinician on day of visit: allergies, medications, problem list, medical history, surgical history, family history, social history, and previous encounter notes.  Time spent on visit including pre-visit chart review and post-visit care was 20 minutes.   Migdalia Dk, am acting as Location manager for [employer], DO   I have reviewed the above documentation for accuracy and completeness, and I agree with the above. Jearld Lesch, DO

## 2019-03-03 ENCOUNTER — Ambulatory Visit: Attending: Family Medicine | Primary: Family Medicine

## 2019-03-03 ENCOUNTER — Ambulatory Visit: Admit: 2019-03-03 | Discharge: 2019-03-03 | Payer: MEDICARE | Attending: Family Medicine | Primary: Family Medicine

## 2019-03-03 DIAGNOSIS — Z0000 Encounter for general adult medical examination without abnormal findings: Secondary | ICD-10-CM

## 2019-03-03 MED ORDER — SIMVASTATIN 20 MG TAB
20 mg | ORAL_TABLET | ORAL | 5 refills | Status: DC
Start: 2019-03-03 — End: 2019-05-01

## 2019-03-03 NOTE — ACP (Advance Care Planning) (Signed)
Advance care planning discussed with the patient including the importance of medical power of attorney and living will.  Patient reports both documents have been created and will bring in copies for scanning into the medical record.

## 2019-03-03 NOTE — Progress Notes (Signed)
SUBJECTIVE:   Brandy Vargas is a 79 y.o. female who is here for subsequent Medicare wellness exam.  Patient has a past medical history significant for hypertension, high cholesterol, atrial fibrillation, overactive bladder, DJD and vitamin D deficiency.  Current medicines are listed in the EMR and reviewed today.  Review of systems reveals no complaints of chest pain, soreness of breath, orthopnea or PND.  GI and GU review of systems is unremarkable.    HPI  See above    Past Medical History, Past Surgical History, Family history, Social History, and Medications were all reviewed with the patient today and updated as necessary.       Current Outpatient Medications   Medication Sig Dispense Refill   ??? simvastatin (ZOCOR) 20 mg tablet TAKE 1 TABLET BY MOUTH ONCE DAILY 30 Tab 5   ??? dilTIAZem IR (CARDIZEM) 30 mg tablet Take 1 tablet po prn q 4h for afib. Heart rate >100     ??? telmisartan (MICARDIS) 80 mg tablet TAKE 1 TABLET BY MOUTH ONCE DAILY 30 Tab 5   ??? metoprolol tartrate (LOPRESSOR) 50 mg tablet TAKE 1 TABLET BY MOUTH TWICE DAILY 30 Tab 5   ??? Eliquis 5 mg tablet TAKE 1 TABLET BY MOUTH TWICE DAILY     ??? solifenacin (VESICARE) 10 mg tablet Take 1 Tab by mouth daily. 30 Tab 5     Allergies   Allergen Reactions   ??? Omeprazole Magnesium Itching     There is no problem list on file for this patient.    Past Medical History:   Diagnosis Date   ??? Atrial fibrillation (HCC)    ??? Hypertension    ??? Overactive bladder      Past Surgical History:   Procedure Laterality Date   ??? HX APPENDECTOMY     ??? HX ORTHOPAEDIC Left 2001    knee surgery-orthroscopic   ??? HX TUBAL LIGATION  1976   ??? 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 Hypertension Mother    ??? High Cholesterol Mother    ??? Heart Attack Father    ??? Diabetes Sister    ??? Diabetes Paternal Grandmother      Social History     Tobacco Use   ??? Smoking status: Never Smoker   ??? Smokeless tobacco: Never Used    Substance Use Topics   ??? Alcohol use: Never     Frequency: Never         Review of Systems  See above    OBJECTIVE:  Visit Vitals  BP 126/80   Ht 5' 3" (1.6 m)   Wt 171 lb 3.2 oz (77.7 kg)   BMI 30.33 kg/m??        Physical Exam  Constitutional:       Appearance: She is well-developed.   HENT:      Head: Normocephalic and atraumatic.      Right Ear: Tympanic membrane and external ear normal.      Left Ear: External ear normal.      Nose: Nose normal.      Mouth/Throat:      Pharynx: No oropharyngeal exudate.   Eyes:      General: No scleral icterus.        Right eye: No discharge.         Left eye: No discharge.      Pupils: Pupils   are equal, round, and reactive to light.   Neck:      Musculoskeletal: Normal range of motion and neck supple.      Thyroid: No thyromegaly.      Vascular: No JVD.      Trachea: No tracheal deviation.   Cardiovascular:      Rate and Rhythm: Normal rate and regular rhythm.      Heart sounds: Normal heart sounds. No murmur. No friction rub. No gallop.    Pulmonary:      Effort: Pulmonary effort is normal. No respiratory distress.      Breath sounds: Normal breath sounds. No wheezing or rales.   Chest:      Chest wall: No tenderness.   Abdominal:      General: Bowel sounds are normal. There is no distension.      Palpations: Abdomen is soft. There is no mass.      Tenderness: There is no abdominal tenderness. There is no guarding or rebound.   Musculoskeletal: Normal range of motion.         General: No tenderness.   Lymphadenopathy:      Cervical: No cervical adenopathy.   Skin:     General: Skin is warm and dry.      Findings: No erythema or rash.   Neurological:      Mental Status: She is alert and oriented to person, place, and time.      Cranial Nerves: No cranial nerve deficit.      Motor: No abnormal muscle tone.      Coordination: Coordination normal.      Deep Tendon Reflexes: Reflexes are normal and symmetric. Reflexes normal.   Psychiatric:         Behavior: Behavior normal.          Thought Content: Thought content normal.         Judgment: Judgment normal.         Medical problems and test results were reviewed with the patient today.         ASSESSMENT and PLAN    1.  Hypertension.  Blood pressure is 126/80.  Check metabolic panel, TSH and CBC.    2.  High cholesterol.  Check lipid panel.    3.  Paroxysmal atrial fibrillation.  Per cardiology.  No syncope or presyncope is reported.    4.  Overactive bladder.  Continue current therapy.  No breakthrough issues complained of.    5.  DJD.  Per orthopedist.  Has had 2 injections in her knees.    6.  Vitamin D deficiency.  Checking vitamin D level.    Elements of this note have been dictated using speech recognition software. As a result, errors of speech recognition may have occurred.

## 2019-03-03 NOTE — Progress Notes (Signed)
This is the Subsequent Medicare Annual Wellness Exam, performed 12 months or more after the Initial AWV or the last Subsequent AWV    I have reviewed the patient's medical history in detail and updated the computerized patient record.     Depression Risk Factor Screening:     3 most recent PHQ Screens 03/03/2019   Little interest or pleasure in doing things Not at all   Feeling down, depressed, irritable, or hopeless Not at all   Total Score PHQ 2 0       Alcohol Risk Screen    Do you average more than 1 drink per night or more than 7 drinks a week:  No    On any one occasion in the past three months have you have had more than 3 drinks containing alcohol:  No        Functional Ability and Level of Safety:    Hearing: Hearing is good.      Activities of Daily Living:  The home contains: no safety equipment.  Patient does total self care      Ambulation: with difficulty, can't walk further than a few feet without the assistance of a cane feet     Fall Risk:  Fall Risk Assessment, last 12 mths 03/03/2019   Able to walk? Yes   Fall in past 12 months? 0   Do you feel unsteady? 0   Are you worried about falling 0      Abuse Screen:  Patient is not abused       Cognitive Screening    Has your family/caregiver stated any concerns about your memory: no     Cognitive Screening: Formal cognitive screening does not appear to be clinically indicated.    Assessment/Plan   Education and counseling provided:  Are appropriate based on today's review and evaluation  End-of-Life planning (with patient's consent)    Diagnoses and all orders for this visit:    1. Medicare annual wellness visit, subsequent    Other orders  -     simvastatin (ZOCOR) 20 mg tablet; TAKE 1 TABLET BY MOUTH ONCE DAILY       Patient is here for Medicare wellness exam.  Anticipatory guidance discussed including the importance of sunscreen use, helmet use and seatbelt use.  No depressive symptoms are reported.  Risk for falls is minimal.  Cognitive functioning appears to be intact.  Pneumococcal vaccines including Prevnar 13 and Pneumovax 23 are up-to-date according to the patient done in [facility].   Advanced care planning discussed with the patient and documented in a separate note.    Health Maintenance Due     Health Maintenance Due   Topic Date Due   ??? Lipid Screen  Never done   ??? COVID-19 Vaccine (1 of 2) Never done   ??? DTaP/Tdap/Td series (1 - Tdap) Never done   ??? Shingrix Vaccine Age 65> (1 of 2) Never done   ??? GLAUCOMA SCREENING Q2Y  Never done   ??? Bone Densitometry (Dexa) Screening  Never done   ??? Pneumococcal 65+ years (1 of 1 - PPSV23) Never done   ??? Flu Vaccine (1) Never done       Patient Care Team   Patient Care Team:  Elvis Coil, MD as PCP - General (Family Medicine)  Elvis Coil, MD as PCP - [facility] Empaneled Provider  Bittrick, Orland Penman, MD (Cardiology)    History   There is no problem list on file  for this patient.    Past Medical History:   Diagnosis Date   ??? Atrial fibrillation (HCC)    ??? Hypertension    ??? Overactive bladder       Past Surgical History:   Procedure Laterality Date   ??? HX APPENDECTOMY     ??? HX ORTHOPAEDIC Left 2001    knee surgery-orthroscopic   ??? HX TUBAL LIGATION  1976   ??? 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 dilTIAZem IR (CARDIZEM) 30 mg tablet Take 1 tablet po prn q 4h for afib. Heart rate >100     ??? telmisartan (MICARDIS) 80 mg tablet TAKE 1 TABLET BY MOUTH ONCE DAILY 30 Tab 5   ??? metoprolol tartrate (LOPRESSOR) 50 mg tablet TAKE 1 TABLET BY MOUTH TWICE DAILY 30 Tab 5   ??? Eliquis 5 mg tablet TAKE 1 TABLET BY MOUTH TWICE DAILY      ??? simvastatin (ZOCOR) 20 mg tablet TAKE 1 TABLET BY MOUTH ONCE DAILY     ??? solifenacin (VESICARE) 10 mg tablet Take 1 Tab by mouth daily. 30 Tab 5     Allergies   Allergen Reactions   ??? Omeprazole Magnesium Itching       Family History   Problem Relation Age of Onset   ??? Hypertension Mother    ??? High Cholesterol Mother    ??? Heart Attack Father    ??? Diabetes Sister    ??? Diabetes Paternal Grandmother      Social History     Tobacco Use   ??? Smoking status: Never Smoker   ??? Smokeless tobacco: Never Used   Substance Use Topics   ??? Alcohol use: Never     Frequency: Never

## 2019-03-03 NOTE — Patient Instructions (Signed)
Medicare Wellness Visit, Female     The best way to live healthy is to have a lifestyle where you eat a well-balanced diet, exercise regularly, limit alcohol use, and quit all forms of tobacco/nicotine, if applicable.     Regular preventive services are another way to keep healthy. Preventive services (vaccines, screening tests, monitoring & exams) can help personalize your care plan, which helps you manage your own care. Screening tests can find health problems at the earliest stages, when they are easiest to treat.   [facility] follows the current, evidence-based guidelines published by the [place] [employer] (USPSTF) when recommending preventive services for our patients. Because we follow these guidelines, sometimes recommendations change over time as research supports it. (For example, mammograms used to be recommended annually. Even though Medicare will still pay for an annual mammogram, the newer guidelines recommend a mammogram every two years for women of average risk).  Of course, you and your doctor may decide to screen more often for some diseases, based on your risk and your co-morbidities (chronic disease you are already diagnosed with).     Preventive services for you include:  - Medicare offers their members a free annual wellness visit, which is time for you and your primary care provider to discuss and plan for your preventive service needs. Take advantage of this benefit every year!  -All adults over the age of 79 should receive the recommended pneumonia vaccines. Current USPSTF guidelines recommend a series of two vaccines for the best pneumonia protection.   -All adults should have a flu vaccine yearly and a tetanus vaccine every 10 years.   -All adults age 67 and older should receive the shingles vaccines (series of two vaccines).      -All adults age 21-70 who are overweight should have a diabetes screening test once every three years.    -All adults born between [birth year] and 1966 should be screened once for Hepatitis C.  -Other screening tests and preventive services for persons with diabetes include: an eye exam to screen for diabetic retinopathy, a kidney function test, a foot exam, and stricter control over your cholesterol.   -Cardiovascular screening for adults with routine risk involves an electrocardiogram (ECG) at intervals determined by your doctor.   -Colorectal cancer screenings should be done for adults age 69-75 with no increased risk factors for colorectal cancer.  There are a number of acceptable methods of screening for this type of cancer. Each test has its own benefits and drawbacks. Discuss with your doctor what is most appropriate for you during your annual wellness visit. The different tests include: colonoscopy (considered the best screening method), a fecal occult blood test, a fecal DNA test, and sigmoidoscopy.    -A bone mass density test is recommended when a woman turns 65 to screen for osteoporosis. This test is only recommended one time, as a screening. Some providers will use this same test as a disease monitoring tool if you already have osteoporosis.  -Breast cancer screenings are recommended every other year for women of normal risk, age 31-74.  -Cervical cancer screenings for women over age 9 are only recommended with certain risk factors.     Here is a list of your current Health Maintenance items (your personalized list of preventive services) with a due date:  Health Maintenance Due   Topic Date Due   ??? Cholesterol Test   Never done   ??? COVID-19 Vaccine (1 of 2) Never  done   ??? DTaP/Tdap/Td  (1 - Tdap) Never done   ??? Shingles Vaccine (1 of 2) Never done   ??? Glaucoma Screening   Never done   ??? Bone Mineral Density   Never done   ??? Pneumococcal Vaccine (1 of 1 - PPSV23) Never done   ??? Yearly Flu Vaccine (1) Never done

## 2019-03-03 NOTE — Addendum Note (Signed)
Addended by: Shary Key on: 03/03/2019 10:42 AM     Modules accepted: Orders

## 2019-03-03 NOTE — Addendum Note (Signed)
Addendum Note  by Shary Key at 03/03/19 0900                Author: Shary Key  Service: --  Author Type: Technician       Filed: 03/03/19 1042  Encounter Date: 03/03/2019  Status: Signed          Editor: Shary Key (Technician)          Addended by: Shary Key on: 03/03/2019 10:42 AM    Modules accepted: Orders

## 2019-03-03 NOTE — Progress Notes (Signed)
This is the Subsequent Medicare Annual Wellness Exam, performed 12 months or more after the Initial AWV or the last Subsequent AWV    I have reviewed the patient's medical history in detail and updated the computerized patient record.     Depression Risk Factor Screening:     3 most recent PHQ Screens 03/03/2019   Little interest or pleasure in doing things Not at all   Feeling down, depressed, irritable, or hopeless Not at all   Total Score PHQ 2 0       Alcohol Risk Screen    Do you average more than 1 drink per night or more than 7 drinks a week:  No    On any one occasion in the past three months have you have had more than 3 drinks containing alcohol:  No        Functional Ability and Level of Safety:    Hearing: Hearing is good.      Activities of Daily Living:  The home contains: no safety equipment.  Patient does total self care      Ambulation: with difficulty, can't walk further than a few feet without the assistance of a cane feet     Fall Risk:  Fall Risk Assessment, last 12 mths 03/03/2019   Able to walk? Yes   Fall in past 12 months? 0   Do you feel unsteady? 0   Are you worried about falling 0      Abuse Screen:  Patient is not abused       Cognitive Screening    Has your family/caregiver stated any concerns about your memory: no     Cognitive Screening: Formal cognitive screening does not appear to be clinically indicated.    Assessment/Plan   Education and counseling provided:  Are appropriate based on today's review and evaluation  End-of-Life planning (with patient's consent)    Diagnoses and all orders for this visit:    1. Medicare annual wellness visit, subsequent    Other orders  -     simvastatin (ZOCOR) 20 mg tablet; TAKE 1 TABLET BY MOUTH ONCE DAILY      Patient is here for Medicare wellness exam.  Anticipatory guidance discussed including the importance of sunscreen use, helmet use and seatbelt use.  No depressive symptoms are reported.  Risk for falls is minimal.  Cognitive functioning  appears to be intact.  Pneumococcal vaccines including Prevnar 13 and Pneumovax 23 are up-to-date according to the patient done in [facility].   Advanced care planning discussed with the patient and documented in a separate note.    Health Maintenance Due     Health Maintenance Due   Topic Date Due   ??? Lipid Screen  Never done   ??? COVID-19 Vaccine (1 of 2) Never done   ??? DTaP/Tdap/Td series (1 - Tdap) Never done   ??? Shingrix Vaccine Age 60> (1 of 2) Never done   ??? GLAUCOMA SCREENING Q2Y  Never done   ??? Bone Densitometry (Dexa) Screening  Never done   ??? Pneumococcal 65+ years (1 of 1 - PPSV23) Never done   ??? Flu Vaccine (1) Never done       Patient Care Team   Patient Care Team:  Elvis Coil, MD as PCP - General (Family Medicine)  Elvis Coil, MD as PCP - [facility] Empaneled Provider  Bittrick, Orland Penman, MD (Cardiology)    History   There is no problem list on file  for this patient.    Past Medical History:   Diagnosis Date   ??? Atrial fibrillation (HCC)    ??? Hypertension    ??? Overactive bladder       Past Surgical History:   Procedure Laterality Date   ??? HX APPENDECTOMY     ??? HX ORTHOPAEDIC Left 2001    knee surgery-orthroscopic   ??? HX TUBAL LIGATION  1976   ??? 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 dilTIAZem IR (CARDIZEM) 30 mg tablet Take 1 tablet po prn q 4h for afib. Heart rate >100     ??? telmisartan (MICARDIS) 80 mg tablet TAKE 1 TABLET BY MOUTH ONCE DAILY 30 Tab 5   ??? metoprolol tartrate (LOPRESSOR) 50 mg tablet TAKE 1 TABLET BY MOUTH TWICE DAILY 30 Tab 5   ??? Eliquis 5 mg tablet TAKE 1 TABLET BY MOUTH TWICE DAILY     ??? simvastatin (ZOCOR) 20 mg tablet TAKE 1 TABLET BY MOUTH ONCE DAILY     ??? solifenacin (VESICARE) 10 mg tablet Take 1 Tab by mouth daily. 30 Tab 5     Allergies   Allergen Reactions   ??? Omeprazole Magnesium Itching       Family History   Problem Relation Age of Onset   ??? Hypertension Mother    ???  High Cholesterol Mother    ??? Heart Attack Father    ??? Diabetes Sister    ??? Diabetes Paternal Grandmother      Social History     Tobacco Use   ??? Smoking status: Never Smoker   ??? Smokeless tobacco: Never Used   Substance Use Topics   ??? Alcohol use: Never     Frequency: Never

## 2019-03-03 NOTE — Progress Notes (Signed)
SUBJECTIVE:   Brandy Vargas is a 79 y.o. female who is here for subsequent Medicare wellness exam.  Patient has a past medical history significant for hypertension, high cholesterol, atrial fibrillation, overactive bladder, DJD and vitamin D deficiency.  Current medicines are listed in the EMR and reviewed today.  Review of systems reveals no complaints of chest pain, soreness of breath, orthopnea or PND.  GI and GU review of systems is unremarkable.    HPI  See above    Past Medical History, Past Surgical History, Family history, Social History, and Medications were all reviewed with the patient today and updated as necessary.       Current Outpatient Medications   Medication Sig Dispense Refill   ??? simvastatin (ZOCOR) 20 mg tablet TAKE 1 TABLET BY MOUTH ONCE DAILY 30 Tab 5   ??? dilTIAZem IR (CARDIZEM) 30 mg tablet Take 1 tablet po prn q 4h for afib. Heart rate >100     ??? telmisartan (MICARDIS) 80 mg tablet TAKE 1 TABLET BY MOUTH ONCE DAILY 30 Tab 5   ??? metoprolol tartrate (LOPRESSOR) 50 mg tablet TAKE 1 TABLET BY MOUTH TWICE DAILY 30 Tab 5   ??? Eliquis 5 mg tablet TAKE 1 TABLET BY MOUTH TWICE DAILY     ??? solifenacin (VESICARE) 10 mg tablet Take 1 Tab by mouth daily. 30 Tab 5     Allergies   Allergen Reactions   ??? Omeprazole Magnesium Itching     There is no problem list on file for this patient.    Past Medical History:   Diagnosis Date   ??? Atrial fibrillation ([place])    ??? Hypertension    ??? Overactive bladder      Past Surgical History:   Procedure Laterality Date   ??? HX APPENDECTOMY     ??? HX ORTHOPAEDIC Left 2001    knee surgery-orthroscopic   ??? HX TUBAL LIGATION  1976   ??? 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 Hypertension Mother    ??? High Cholesterol Mother    ??? Heart Attack Father    ??? Diabetes Sister    ??? Diabetes Paternal Grandmother      Social History     Tobacco Use   ??? Smoking status: Never Smoker   ??? Smokeless tobacco: Never Used    Substance Use Topics   ??? Alcohol use: Never     Frequency: Never         Review of Systems  See above    OBJECTIVE:  Visit Vitals  BP 126/80   Ht 5\' 3"  (1.6 m)   Wt 171 lb 3.2 oz (77.7 kg)   BMI 30.33 kg/m??        Physical Exam  Constitutional:       Appearance: She is well-developed.   HENT:      Head: Normocephalic and atraumatic.      Right Ear: Tympanic membrane and external ear normal.      Left Ear: External ear normal.      Nose: Nose normal.      Mouth/Throat:      Pharynx: No oropharyngeal exudate.   Eyes:      General: No scleral icterus.        Right eye: No discharge.         Left eye: No discharge.      Pupils: Pupils  are equal, round, and reactive to light.   Neck:      Musculoskeletal: Normal range of motion and neck supple.      Thyroid: No thyromegaly.      Vascular: No JVD.      Trachea: No tracheal deviation.   Cardiovascular:      Rate and Rhythm: Normal rate and regular rhythm.      Heart sounds: Normal heart sounds. No murmur. No friction rub. No gallop.    Pulmonary:      Effort: Pulmonary effort is normal. No respiratory distress.      Breath sounds: Normal breath sounds. No wheezing or rales.   Chest:      Chest wall: No tenderness.   Abdominal:      General: Bowel sounds are normal. There is no distension.      Palpations: Abdomen is soft. There is no mass.      Tenderness: There is no abdominal tenderness. There is no guarding or rebound.   Musculoskeletal: Normal range of motion.         General: No tenderness.   Lymphadenopathy:      Cervical: No cervical adenopathy.   Skin:     General: Skin is warm and dry.      Findings: No erythema or rash.   Neurological:      Mental Status: She is alert and oriented to person, place, and time.      Cranial Nerves: No cranial nerve deficit.      Motor: No abnormal muscle tone.      Coordination: Coordination normal.      Deep Tendon Reflexes: Reflexes are normal and symmetric. Reflexes normal.   Psychiatric:         Behavior: Behavior normal.          Thought Content: Thought content normal.         Judgment: Judgment normal.         Medical problems and test results were reviewed with the patient today.         ASSESSMENT and PLAN    1.  Hypertension.  Blood pressure is 126/80.  Check metabolic panel, TSH and CBC.    2.  High cholesterol.  Check lipid panel.    3.  Paroxysmal atrial fibrillation.  Per cardiology.  No syncope or presyncope is reported.    4.  Overactive bladder.  Continue current therapy.  No breakthrough issues complained of.    5.  DJD.  Per orthopedist.  Has had 2 injections in her knees.    6.  Vitamin D deficiency.  Checking vitamin D level.    Elements of this note have been dictated using speech recognition software. As a result, errors of speech recognition may have occurred.

## 2019-03-04 LAB — VITAMIN D 25 HYDROXY: Vit D, 25-Hydroxy: 37.4 ng/mL (ref 30.0–100.0)

## 2019-03-04 LAB — VITAMIN D, 25 HYDROXY: VITAMIN D, 25-HYDROXY: 37.4 ng/mL (ref 30.0–100.0)

## 2019-03-10 ENCOUNTER — Other Ambulatory Visit: Payer: Self-pay

## 2019-03-10 ENCOUNTER — Encounter (INDEPENDENT_AMBULATORY_CARE_PROVIDER_SITE_OTHER): Payer: Self-pay | Admitting: Bariatrics

## 2019-03-10 ENCOUNTER — Telehealth (INDEPENDENT_AMBULATORY_CARE_PROVIDER_SITE_OTHER): Payer: Medicare Other | Admitting: Bariatrics

## 2019-03-10 DIAGNOSIS — Z6832 Body mass index (BMI) 32.0-32.9, adult: Secondary | ICD-10-CM | POA: Diagnosis not present

## 2019-03-10 DIAGNOSIS — E669 Obesity, unspecified: Secondary | ICD-10-CM

## 2019-03-10 DIAGNOSIS — I10 Essential (primary) hypertension: Secondary | ICD-10-CM

## 2019-03-10 DIAGNOSIS — E785 Hyperlipidemia, unspecified: Secondary | ICD-10-CM

## 2019-03-10 NOTE — Progress Notes (Signed)
TeleHealth Visit:  Due to the COVID-19 pandemic, this visit was completed with telemedicine (audio/video) technology to reduce patient and provider exposure as well as to preserve personal protective equipment.   OTHEL GREBNER has verbally consented to this TeleHealth visit. The patient is located at home, the provider is located at the [facility] office. The participants in this visit include the listed provider and patient. The visit was conducted today via audio.  Jeylah was unable to use realtime audiovisual technology today and the telehealth visit was conducted via telephone.  Chief Complaint: OBESITY Daleyzah is here to discuss her progress with her obesity treatment plan along with follow-up of her obesity related diagnoses. Kyia is on the Category 1 Plan and states she is following her eating plan approximately 85% of the time. Cresha states she is exercising 0 minutes 0 times per week.  Today's visit was #: 20 Starting weight: 193 lbs Starting date: 02/25/2018  Interim History: Jisella states that her weight is down 2 lbs (weight 165). She did get back on track.  Her goal weight is 150 lbs. She reports getting adequate protein.  Subjective:   Essential hypertension. Blood pressure has been well controlled.  BP Readings from Last 3 Encounters:  07/22/18 (!) 152/64  07/17/18 121/70  07/10/18 126/64   Lab Results  Component Value Date   CREATININE 0.83 07/28/2018   CREATININE 0.91 07/22/2018   CREATININE 0.93 02/25/2018   Dyslipidemia. Miha is taking Zocor. No myalgias.  Assessment/Plan:   Essential hypertension. Madyline is working on healthy weight loss and exercise to improve blood pressure control. We will watch for signs of hypotension as she continues her lifestyle modifications. She will continue her medications as directed.  Dyslipidemia. Kameela will continue Zocor as directed.  Class 1 obesity with serious comorbidity and body mass index (BMI) of  32.0 to 32.9 in adult, unspecified obesity type.  Deborah is currently in the action stage of change. As such, her goal is to continue with weight loss efforts. She has agreed to the Category 1 Plan.   She will work on meal planning and mindful eating.  Exercise goals: Renita is walking some and seeing the orthopedist.  Behavioral modification strategies: increasing lean protein intake, decreasing simple carbohydrates, increasing vegetables, increasing water intake, decreasing eating out, no skipping meals, meal planning and cooking strategies, keeping healthy foods in the home and planning for success.  Shir has agreed to follow-up with our clinic in 2 weeks. She was informed of the importance of frequent follow-up visits to maximize her success with intensive lifestyle modifications for her multiple health conditions.  Objective:   VITALS: Per patient if applicable, see vitals. GENERAL: Alert and in no acute distress. CARDIOPULMONARY: No increased WOB. Speaking in clear sentences.  PSYCH: Pleasant and cooperative. Speech normal rate and rhythm. Affect is appropriate. Insight and judgement are appropriate. Attention is focused, linear, and appropriate.  NEURO: Oriented as arrived to appointment on time with no prompting.   Lab Results  Component Value Date   CREATININE 0.83 07/28/2018   BUN 14 07/28/2018   NA 133 (L) 07/28/2018   K 4.6 07/28/2018   CL 91 (L) 07/28/2018   CO2 23 07/28/2018   Lab Results  Component Value Date   ALT 9 07/28/2018   AST 15 07/28/2018   ALKPHOS 121 (H) 07/28/2018   BILITOT 0.6 07/28/2018   Lab Results  Component Value Date   HGBA1C 5.7 (H) 07/28/2018   HGBA1C 6.0 (  H) 02/25/2018   Lab Results  Component Value Date   INSULIN 12.7 07/28/2018   INSULIN 9.7 02/25/2018   Lab Results  Component Value Date   TSH 0.555 02/25/2018   Lab Results  Component Value Date   CHOL 152 07/28/2018   HDL 37 (L) 07/28/2018   LDLCALC 97 07/28/2018   TRIG 89  07/28/2018   Lab Results  Component Value Date   WBC 7.8 07/22/2018   HGB 13.6 07/22/2018   HCT 41.6 07/22/2018   MCV 86.3 07/22/2018   PLT 215 07/22/2018   No results found for: IRON, TIBC, FERRITIN  Attestation Statements:   Reviewed by clinician on day of visit: allergies, medications, problem list, medical history, surgical history, family history, social history, and previous encounter notes.  Time spent on visit including pre-visit chart review and post-visit charting and care was 20 minutes.   Migdalia Dk, am acting as Location manager for [employer], DO   I have reviewed the above documentation for accuracy and completeness, and I agree with the above. Jearld Lesch, DO

## 2019-03-24 ENCOUNTER — Telehealth (INDEPENDENT_AMBULATORY_CARE_PROVIDER_SITE_OTHER): Payer: Medicare Other | Admitting: Bariatrics

## 2019-03-24 ENCOUNTER — Encounter (INDEPENDENT_AMBULATORY_CARE_PROVIDER_SITE_OTHER): Payer: Self-pay | Admitting: Bariatrics

## 2019-03-24 ENCOUNTER — Other Ambulatory Visit: Payer: Self-pay

## 2019-03-24 DIAGNOSIS — E669 Obesity, unspecified: Secondary | ICD-10-CM | POA: Diagnosis not present

## 2019-03-24 DIAGNOSIS — E7849 Other hyperlipidemia: Secondary | ICD-10-CM | POA: Diagnosis not present

## 2019-03-24 DIAGNOSIS — Z6833 Body mass index (BMI) 33.0-33.9, adult: Secondary | ICD-10-CM

## 2019-03-24 DIAGNOSIS — I10 Essential (primary) hypertension: Secondary | ICD-10-CM

## 2019-03-24 NOTE — Progress Notes (Signed)
TeleHealth Visit:  Due to the COVID-19 pandemic, this visit was completed with telemedicine (audio/video) technology to reduce patient and provider exposure as well as to preserve personal protective equipment.   Gabriella Walker has verbally consented to this TeleHealth visit. The patient is located at home, the provider is located at the [facility] office. The participants in this visit include the listed provider and patient. The visit was conducted today via audio.  Gabriella Walker was unable to use realtime audiovisual technology today and the telehealth visit was conducted via telephone.  Chief Complaint: OBESITY Gabriella Walker is here to discuss her progress with her obesity treatment plan along with follow-up of her obesity related diagnoses. Gabriella Walker is on the Category 1 Plan and states she is following her eating plan approximately 75% of the time. Gabriella Walker states she is walking 15 minutes 3-4 times per week.  Today's visit was #: 21 Starting weight: 193 lbs Starting date: 02/25/2018  Interim History: Gabriella Walker is down an additional 2 lbs since her last visit (weight 165 lbs). Her goal is to get down to 150 lbs.  Subjective:   Other hyperlipidemia. [name] is taking Zocor. No myalgias.   Lab Results  Component Value Date   CHOL 152 07/28/2018   HDL 37 (L) 07/28/2018   LDLCALC 97 07/28/2018   TRIG 89 07/28/2018   Lab Results  Component Value Date   ALT 9 07/28/2018   AST 15 07/28/2018   ALKPHOS 121 (H) 07/28/2018   BILITOT 0.6 07/28/2018   The ASCVD Risk score Mikey Bussing DC Jr., et al., 2013) failed to calculate for the following reasons:   The 2013 ASCVD risk score is only valid for ages 25 to 33  Essential hypertension. Gabriella Walker is taking diltiazem, lopressor, and Micardis. Systolic blood pressure is slightly elevated today.  BP Readings from Last 3 Encounters:  07/22/18 (!) 152/64  07/17/18 121/70  07/10/18 126/64   Lab Results  Component Value Date   CREATININE 0.83  07/28/2018   CREATININE 0.91 07/22/2018   CREATININE 0.93 02/25/2018   Assessment/Plan:   Other hyperlipidemia. Cardiovascular risk and specific lipid/LDL goals reviewed.  We discussed several lifestyle modifications today and Gabriella Walker will continue to work on diet, exercise and weight loss efforts. Orders and follow up as documented in patient record. She will continue Zocor as directed.  Counseling Intensive lifestyle modifications are the first line treatment for this issue. . Dietary changes: Increase soluble fiber. Decrease simple carbohydrates. . Exercise changes: Moderate to vigorous-intensity aerobic activity 150 minutes per week if tolerated. . Lipid-lowering medications: see documented in medical record.  Essential hypertension. Gabriella Walker is working on healthy weight loss and exercise to improve blood pressure control. We will watch for signs of hypotension as she continues her lifestyle modifications. She will continue her medications as directed.  Class 1 obesity with serious comorbidity and body mass index (BMI) of 33.0 to 33.9 in adult, unspecified obesity type.  Gabriella Walker is currently in the action stage of change. As such, her goal is to continue with weight loss efforts. She has agreed to the Category 1 Plan.   She will work on meal planning, intentional eating, will stay adherent to the plan, and will increase her water intake.  Exercise goals: Delva is walking but is still having some hip pain.  Behavioral modification strategies: increasing lean protein intake, decreasing simple carbohydrates, increasing vegetables, increasing water intake, decreasing eating out, no skipping meals, meal planning and cooking strategies, keeping healthy foods in the  home, better snacking choices and planning for success.  Gabriella Walker has agreed to follow-up with our clinic PRN. She was informed of the importance of frequent follow-up visits to maximize her success with intensive lifestyle modifications for  her multiple health conditions.  Objective:   VITALS: Per patient if applicable, see vitals. GENERAL: Alert and in no acute distress. CARDIOPULMONARY: No increased WOB. Speaking in clear sentences.  PSYCH: Pleasant and cooperative. Speech normal rate and rhythm. Affect is appropriate. Insight and judgement are appropriate. Attention is focused, linear, and appropriate.  NEURO: Oriented as arrived to appointment on time with no prompting.   Lab Results  Component Value Date   CREATININE 0.83 07/28/2018   BUN 14 07/28/2018   NA 133 (L) 07/28/2018   K 4.6 07/28/2018   CL 91 (L) 07/28/2018   CO2 23 07/28/2018   Lab Results  Component Value Date   ALT 9 07/28/2018   AST 15 07/28/2018   ALKPHOS 121 (H) 07/28/2018   BILITOT 0.6 07/28/2018   Lab Results  Component Value Date   HGBA1C 5.7 (H) 07/28/2018   HGBA1C 6.0 (H) 02/25/2018   Lab Results  Component Value Date   INSULIN 12.7 07/28/2018   INSULIN 9.7 02/25/2018   Lab Results  Component Value Date   TSH 0.555 02/25/2018   Lab Results  Component Value Date   CHOL 152 07/28/2018   HDL 37 (L) 07/28/2018   LDLCALC 97 07/28/2018   TRIG 89 07/28/2018   Lab Results  Component Value Date   WBC 7.8 07/22/2018   HGB 13.6 07/22/2018   HCT 41.6 07/22/2018   MCV 86.3 07/22/2018   PLT 215 07/22/2018   No results found for: IRON, TIBC, FERRITIN  Attestation Statements:   Reviewed by clinician on day of visit: allergies, medications, problem list, medical history, surgical history, family history, social history, and previous encounter notes.  Time spent on visit including pre-visit chart review and post-visit charting and care was 20 minutes.   Migdalia Dk, am acting as Location manager for [employer], DO   I have reviewed the above documentation for accuracy and completeness, and I agree with the above. Jearld Lesch, DO

## 2019-03-31 ENCOUNTER — Encounter: Attending: Family Medicine | Primary: Family Medicine

## 2019-04-02 ENCOUNTER — Other Ambulatory Visit: Admit: 2019-04-02 | Discharge: 2019-04-02 | Payer: MEDICARE | Primary: Family Medicine

## 2019-04-02 DIAGNOSIS — I10 Essential (primary) hypertension: Secondary | ICD-10-CM

## 2019-04-02 LAB — AMB POC COMPLETE CBC,AUTOMATED ENTER
ABS. GRANS (POC): 5.4 10*3/uL (ref 2.0–7.8)
ABS. LYMPHS (POC): 2.4 10*3/uL (ref 0.6–4.1)
GRANULOCYTES (POC): 62.9 % (ref 37.0–92.0)
Granulocytes %, POC: 62.9 % (ref 37.0–92.0)
Granulocytes Abs: 5.4 10*3/uL (ref 2.0–7.8)
HCT (POC): 40.4 % (ref 37.0–51.0)
HGB (POC): 13.3 g/dL (ref 12.0–18.0)
Hematocrit, POC: 40.4 % (ref 37.0–51.0)
Hemoglobin, POC: 13.3 g/dL (ref 12.0–18.0)
LYMPHOCYTES (POC): 27.9 % (ref 10.0–58.5)
Lymphocyte %: 27.9 % (ref 10.0–58.5)
Lymphs Abs: 2.4 10*3/uL (ref 0.6–4.1)
MCH (POC): 28.7 pg (ref 26.0–32.0)
MCH: 28.7 pg (ref 26.0–32.0)
MCHC (POC): 32.9 g/dL (ref 31.0–36.0)
MCHC: 32.9 g/dL (ref 31.0–36.0)
MCV (POC): 87.1 fL (ref 80.0–97.0)
MCV: 87.1 fL (ref 80.0–97.0)
MID% POC: 9.2 % (ref 0.1–24.0)
MPV (POC): 8.3 fL (ref 0.0–49.9)
MPV POC: 8.3 fL (ref 0.0–49.9)
Mid # (POC): 0.8 10*3/uL (ref 0.0–1.8)
Mid Cells %, POC: 9.2 % (ref 0.1–24.0)
Mid Cells Absoulute POC: 0.8 10*3/uL (ref 0.0–1.8)
PLATELET (POC): 192 10*3/uL (ref 140–440)
Platelet Count, POC: 192 10*3/uL (ref 140–440)
RBC (POC): 4.64 10*6/uL (ref 4.20–6.30)
RBC, POC: 4.64 10*6/uL (ref 4.20–6.30)
RDW (POC): 14.5 % (ref 11.5–14.5)
RDW, POC: 14.5 % (ref 11.5–14.5)
WBC (POC): 8.6 10*3/uL (ref 4.1–10.9)
WBC, POC: 8.6 10*3/uL (ref 4.1–10.9)

## 2019-04-03 LAB — COMPREHENSIVE METABOLIC PANEL
ALT: 12 IU/L (ref 0–32)
AST: 14 IU/L (ref 0–40)
Albumin/Globulin Ratio: 1.8 NA (ref 1.2–2.2)
Albumin: 3.9 g/dL (ref 3.7–4.7)
Alkaline Phosphatase: 109 IU/L (ref 39–117)
BUN: 16 mg/dL (ref 8–27)
Bun/Cre Ratio: 19 NA (ref 12–28)
CO2: 24 mmol/L (ref 20–29)
Calcium: 9.2 mg/dL (ref 8.7–10.3)
Chloride: 100 mmol/L (ref 96–106)
Creatinine: 0.85 mg/dL (ref 0.57–1.00)
EGFR IF NonAfrican American: 65 mL/min/{1.73_m2} (ref >59)
GFR African American: 75 mL/min/{1.73_m2} (ref >59)
Globulin, Total: 2.2 g/dL (ref 1.5–4.5)
Glucose: 96 mg/dL (ref 65–99)
Potassium: 4.4 mmol/L (ref 3.5–5.2)
Sodium: 140 mmol/L (ref 134–144)
Total Bilirubin: 0.3 mg/dL (ref 0.0–1.2)
Total Protein: 6.1 g/dL (ref 6.0–8.5)

## 2019-04-03 LAB — LIPID PANEL
Cholesterol, Total: 132 mg/dL (ref 100–199)
Cholesterol, total: 132 mg/dL (ref 100–199)
HDL Cholesterol: 42 mg/dL (ref >39)
HDL: 42 mg/dL (ref >39)
LDL Calculated: 62 mg/dL (ref 0–99)
LDL, calculated: 62 mg/dL (ref 0–99)
Triglyceride: 163 mg/dL — ABNORMAL HIGH (ref 0–149)
Triglycerides: 163 mg/dL — ABNORMAL HIGH (ref 0–149)
VLDL, calculated: 28 mg/dL (ref 5–40)
VLDL: 28 mg/dL (ref 5–40)

## 2019-04-03 LAB — TSH 3RD GENERATION
TSH: 1.01 u[IU]/mL (ref 0.450–4.500)
TSH: 1.01 u[IU]/mL (ref 0.450–4.500)

## 2019-04-03 LAB — METABOLIC PANEL, COMPREHENSIVE
A-G Ratio: 1.8 (ref 1.2–2.2)
ALT (SGPT): 12 IU/L (ref 0–32)
AST (SGOT): 14 IU/L (ref 0–40)
Albumin: 3.9 g/dL (ref 3.7–4.7)
Alk. phosphatase: 109 IU/L (ref 39–117)
BUN/Creatinine ratio: 19 (ref 12–28)
BUN: 16 mg/dL (ref 8–27)
Bilirubin, total: 0.3 mg/dL (ref 0.0–1.2)
CO2: 24 mmol/L (ref 20–29)
Calcium: 9.2 mg/dL (ref 8.7–10.3)
Chloride: 100 mmol/L (ref 96–106)
Creatinine: 0.85 mg/dL (ref 0.57–1.00)
GFR est AA: 75 mL/min/{1.73_m2} (ref >59)
GFR est non-AA: 65 mL/min/{1.73_m2} (ref >59)
GLOBULIN, TOTAL: 2.2 g/dL (ref 1.5–4.5)
Glucose: 96 mg/dL (ref 65–99)
Potassium: 4.4 mmol/L (ref 3.5–5.2)
Protein, total: 6.1 g/dL (ref 6.0–8.5)
Sodium: 140 mmol/L (ref 134–144)

## 2019-04-17 MED ORDER — ELIQUIS 5 MG TABLET
5 mg | ORAL_TABLET | Freq: Two times a day (BID) | ORAL | 3 refills | Status: DC
Start: 2019-04-17 — End: 2019-07-31

## 2019-04-17 NOTE — Telephone Encounter (Signed)
Eliquis 5mg   1 BID #[id number]  [facility] 

## 2019-04-17 NOTE — Telephone Encounter (Signed)
Requested Prescriptions     Signed Prescriptions Disp Refills   ??? Eliquis 5 mg tablet 180 Tab 3     Sig: Take 1 Tab by mouth two (2) times a day.     Authorizing Provider: Hilliard Clark     Ordering User: Addison Bailey

## 2019-04-22 ENCOUNTER — Ambulatory Visit: Attending: Orthopaedic Surgery | Primary: Family Medicine

## 2019-04-22 ENCOUNTER — Encounter: Admit: 2019-04-22 | Discharge: 2019-04-22 | Payer: MEDICARE | Primary: Family Medicine

## 2019-04-22 ENCOUNTER — Ambulatory Visit: Admit: 2019-04-22 | Discharge: 2019-04-22 | Payer: MEDICARE | Attending: Orthopaedic Surgery | Primary: Family Medicine

## 2019-04-22 DIAGNOSIS — M79605 Pain in left leg: Secondary | ICD-10-CM

## 2019-04-22 NOTE — Progress Notes (Signed)
Name: Brandy Vargas  Date of Birth: 1940/03/01  Gender: female  MRN: [id number]    CC:   Chief Complaint   Patient presents with   ??? Knee Pain     bilat knee   ??? Hip Pain     left hip       HPI:   The pain has been present for months and is becoming worse.  It hurts now with sleep at night.  The pain is located over the knee and hip regions.  It does hurt to walk and gets worse with increased distances and she sometimes needs a cane.  Dr. Kathryne Sharper has injected her knees and this is helped her to some degree.  Her knees are too uncomfortable today..   The pain does radiate down the leg on the inner left thigh.  Numbness and tingling are not noted.   Treatment so far has been anti-inflammatory medications and injection therapies in the knee.    Review of Systems  As per HPI.  Pertinent positives and negatives are addressed with the patient, particularly those related to musculoskeletal concerns.  Non-orthopaedic concerns were referred back to the primary care physician.      [place]:    Current Outpatient Medications:   ???  Eliquis 5 mg tablet, Take 1 Tab by mouth two (2) times a day., Disp: 180 Tab, Rfl: 3  ???  simvastatin (ZOCOR) 20 mg tablet, TAKE 1 TABLET BY MOUTH ONCE DAILY, Disp: 30 Tab, Rfl: 5  ???  dilTIAZem IR (CARDIZEM) 30 mg tablet, Take 1 tablet po prn q 4h for afib. Heart rate >100, Disp: , Rfl:   ???  telmisartan (MICARDIS) 80 mg tablet, TAKE 1 TABLET BY MOUTH ONCE DAILY, Disp: 30 Tab, Rfl: 5  ???  metoprolol tartrate (LOPRESSOR) 50 mg tablet, TAKE 1 TABLET BY MOUTH TWICE DAILY, Disp: 30 Tab, Rfl: 5  ???  solifenacin (VESICARE) 10 mg tablet, Take 1 Tab by mouth daily., Disp: 30 Tab, Rfl: 5  Allergies   Allergen Reactions   ??? Omeprazole Magnesium Itching     Past Medical History:   Diagnosis Date   ??? Atrial fibrillation ([place])    ??? Hypertension    ??? Overactive bladder       Past Surgical History:   Procedure Laterality Date   ??? HX APPENDECTOMY     ??? HX KNEE ARTHROSCOPY     ??? HX ORTHOPAEDIC Left 2001    knee  surgery-orthroscopic   ??? HX TUBAL LIGATION  1976   ??? 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 Hypertension Mother    ??? High Cholesterol Mother    ??? Heart Attack Father    ??? Diabetes Sister    ??? Diabetes Paternal Grandmother      Social History     Socioeconomic History   ??? Marital status: WIDOWED     Spouse name: Not on file   ??? Number of children: Not on file   ??? Years of education: Not on file   ??? Highest education level: Not on file   Occupational History   ??? Not on file   Social Needs   ??? Financial resource strain: Not on file   ??? Food insecurity     Worry: Not on file     Inability: Not on file   ??? Transportation needs     Medical: Not  on file     Non-medical: Not on file   Tobacco Use   ??? Smoking status: Never Smoker   ??? Smokeless tobacco: Never Used   Substance and Sexual Activity   ??? Alcohol use: Never     Frequency: Never   ??? Drug use: Never   ??? Sexual activity: Not on file   Lifestyle   ??? Physical activity     Days per week: Not on file     Minutes per session: Not on file   ??? Stress: Not on file   Relationships   ??? Social Wellsite geologist on phone: Not on file     Gets together: Not on file     Attends religious service: Not on file     Active member of club or organization: Not on file     Attends meetings of clubs or organizations: Not on file     Relationship status: Not on file   ??? Intimate partner violence     Fear of current or ex partner: Not on file     Emotionally abused: Not on file     Physically abused: Not on file     Forced sexual activity: Not on file   Other Topics Concern   ??? Not on file   Social History Narrative   ??? Not on file       PHYSICAL EXAMINATION:   The patient is alert and oriented, in no acute distress.  Visit Vitals  Ht 5\' 3"  (1.6 m)   Wt 170 lb (77.1 kg)   BMI 30.11 kg/m??         The lower extremities are as described below.  Circulation is normal with palpable pedal pulses bilaterally and no edema.   There is no lymph adenopathy in the popliteal or malleolar region.  Skin is without scarring about the knee bilaterally.  There is no stasis disease distally bilaterally.  Sensation is intact to light touch bilaterally.  The gait is noted to be with a slight trendelenburg and antalgia.  Range of motion is 0 to 90 degrees of flexion with 15 degrees internal and 25 degrees external rotation and some groin pain with this.  Range of motion of the knee is 20-110 degrees on the right and 20-110 degrees on the left.  There is 43mm. of anterior/posterior translation and 55mm of medial/lateral instability bilaterally.  There is 5 degrees of varus alignment on the right and 5 degrees of varus alignment on the left.  Limb lengths are equal.  Straight leg test is negative.  Quadriceps strength is excellent.  Their judgment and insight are normal.  They are oriented to time, place and person.  Their memory is good and the mood and affect appropriate.    XRAYS:   AP pelvis and lateral view of left hip are reviewed.  There is joint space loss, eburnated bone and osteophyte formation of the hip.  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  There is  There is joint space loss, eburnated bone, and osteophyte formation present.  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 Pain of left lower extremity  M79.605 729.5   2. Arthritis of left hip  M16.12 716.95   3. Arthritis of left knee  M17.12  716.96   4. Arthritis of right knee  M17.11 716.96       RECOMMENDATION:           Today we discussed conservative treatments such as NSAIDs and PT.  To date these have not been effective for the patient.     THA - Today we discussed left hip replacement surgery.  They are aware of the 1% risk of dislocation and 1% risk of infection.  They were also informed of the possibility of stroke, heart attack and blood clot-any  of these which could result in further hospitalization or death. They will notify us and we will proceed at their discretion.  She is going to read the book and return in 2 weeks.  The hip pain is much more debilitating than the knee arthritis for which she has received injections in the past.  Another thought is an intra-articular left hip cortisone injection which she will think about.

## 2019-04-22 NOTE — Progress Notes (Signed)
Name: Brandy Vargas  Date of Birth: 1940/03/01  Gender: female  MRN: [id number]    CC:   Chief Complaint   Patient presents with   ??? Knee Pain     bilat knee   ??? Hip Pain     left hip       HPI:   The pain has been present for months and is becoming worse.  It hurts now with sleep at night.  The pain is located over the knee and hip regions.  It does hurt to walk and gets worse with increased distances and she sometimes needs a cane.  Dr. Kathryne Sharper has injected her knees and this is helped her to some degree.  Her knees are too uncomfortable today..   The pain does radiate down the leg on the inner left thigh.  Numbness and tingling are not noted.   Treatment so far has been anti-inflammatory medications and injection therapies in the knee.    Review of Systems  As per HPI.  Pertinent positives and negatives are addressed with the patient, particularly those related to musculoskeletal concerns.  Non-orthopaedic concerns were referred back to the primary care physician.      [place]:    Current Outpatient Medications:   ???  Eliquis 5 mg tablet, Take 1 Tab by mouth two (2) times a day., Disp: 180 Tab, Rfl: 3  ???  simvastatin (ZOCOR) 20 mg tablet, TAKE 1 TABLET BY MOUTH ONCE DAILY, Disp: 30 Tab, Rfl: 5  ???  dilTIAZem IR (CARDIZEM) 30 mg tablet, Take 1 tablet po prn q 4h for afib. Heart rate >100, Disp: , Rfl:   ???  telmisartan (MICARDIS) 80 mg tablet, TAKE 1 TABLET BY MOUTH ONCE DAILY, Disp: 30 Tab, Rfl: 5  ???  metoprolol tartrate (LOPRESSOR) 50 mg tablet, TAKE 1 TABLET BY MOUTH TWICE DAILY, Disp: 30 Tab, Rfl: 5  ???  solifenacin (VESICARE) 10 mg tablet, Take 1 Tab by mouth daily., Disp: 30 Tab, Rfl: 5  Allergies   Allergen Reactions   ??? Omeprazole Magnesium Itching     Past Medical History:   Diagnosis Date   ??? Atrial fibrillation ([place])    ??? Hypertension    ??? Overactive bladder       Past Surgical History:   Procedure Laterality Date   ??? HX APPENDECTOMY     ??? HX KNEE ARTHROSCOPY     ??? HX ORTHOPAEDIC Left 2001    knee  surgery-orthroscopic   ??? HX TUBAL LIGATION  1976   ??? 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 Hypertension Mother    ??? High Cholesterol Mother    ??? Heart Attack Father    ??? Diabetes Sister    ??? Diabetes Paternal Grandmother      Social History     Socioeconomic History   ??? Marital status: WIDOWED     Spouse name: Not on file   ??? Number of children: Not on file   ??? Years of education: Not on file   ??? Highest education level: Not on file   Occupational History   ??? Not on file   Social Needs   ??? Financial resource strain: Not on file   ??? Food insecurity     Worry: Not on file     Inability: Not on file   ??? Transportation needs     Medical: Not  on file     Non-medical: Not on file   Tobacco Use   ??? Smoking status: Never Smoker   ??? Smokeless tobacco: Never Used   Substance and Sexual Activity   ??? Alcohol use: Never     Frequency: Never   ??? Drug use: Never   ??? Sexual activity: Not on file   Lifestyle   ??? Physical activity     Days per week: Not on file     Minutes per session: Not on file   ??? Stress: Not on file   Relationships   ??? Social Product manager on phone: Not on file     Gets together: Not on file     Attends religious service: Not on file     Active member of club or organization: Not on file     Attends meetings of clubs or organizations: Not on file     Relationship status: Not on file   ??? Intimate partner violence     Fear of current or ex partner: Not on file     Emotionally abused: Not on file     Physically abused: Not on file     Forced sexual activity: Not on file   Other Topics Concern   ??? Not on file   Social History Narrative   ??? Not on file       PHYSICAL EXAMINATION:   The patient is alert and oriented, in no acute distress.  Visit Vitals  Ht 5\' 3"  (1.6 m)   Wt 170 lb (77.1 kg)   BMI 30.11 kg/m??         The lower extremities are as described below.  Circulation is normal with palpable pedal pulses bilaterally and no  edema.  There is no lymph adenopathy in the popliteal or malleolar region.  Skin is without scarring about the knee bilaterally.  There is no stasis disease distally bilaterally.  Sensation is intact to light touch bilaterally.  The gait is noted to be with a slight trendelenburg and antalgia.  Range of motion is 0 to 90 degrees of flexion with 15 degrees internal and 25 degrees external rotation and some groin pain with this.  Range of motion of the knee is 20-110 degrees on the right and 20-110 degrees on the left.  There is 10mm. of anterior/posterior translation and 23mm of medial/lateral instability bilaterally.  There is 5 degrees of varus alignment on the right and 5 degrees of varus alignment on the left.  Limb lengths are equal.  Straight leg test is negative.  Quadriceps strength is excellent.  Their judgment and insight are normal.  They are oriented to time, place and person.  Their memory is good and the mood and affect appropriate.    XRAYS:   AP pelvis and lateral view of left hip are reviewed.  There is joint space loss, eburnated bone and osteophyte formation of the hip.  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  There is  There is joint space loss, eburnated bone, and osteophyte formation present.  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 Pain of left lower extremity  M79.605 729.5   2. Arthritis of left hip  M16.12 716.95   3. Arthritis of left knee  M17.12  716.96   4. Arthritis of right knee  M17.11 716.96       RECOMMENDATION:           Today we discussed conservative treatments such as NSAIDs and PT.  To date these have not been effective for the patient.     THA - Today we discussed left hip replacement surgery.  They are aware of the 1% risk of dislocation and 1% risk of infection.  They were also informed of the possibility of stroke, heart attack and blood  clot-any of these which could result in further hospitalization or death. They will notify us and we will proceed at their discretion.  She is going to read the book and return in 2 weeks.  The hip pain is much more debilitating than the knee arthritis for which she has received injections in the past.  Another thought is an intra-articular left hip cortisone injection which she will think about.

## 2019-04-28 ENCOUNTER — Telehealth: Attending: Family Medicine | Primary: Family Medicine

## 2019-04-28 ENCOUNTER — Telehealth: Admit: 2019-04-28 | Discharge: 2019-04-28 | Payer: MEDICARE | Attending: Family Medicine | Primary: Family Medicine

## 2019-04-28 DIAGNOSIS — E781 Pure hyperglyceridemia: Secondary | ICD-10-CM

## 2019-04-28 NOTE — Progress Notes (Signed)
SUBJECTIVE:   Brandy Vargas is a 79 y.o. female who has a past medical history significant for hypertension, high cholesterol, atrial fibrillation, overactive bladder, DJD and vitamin D deficiency.  Current medicines are listed in the EMR and reviewed today.  Review of systems reveals no complaints of chest pain, soreness of breath, orthopnea or PND.  GI and GU review of systems is unremarkable.  Patient presents today for virtual visit via telephone encounter.  ??    Patient's vital signs were not performed as encounter was performed virtually.  Location of virtual visit was patient's home.      HPI  See above    Past Medical History, Past Surgical History, Family history, Social History, and Medications were all reviewed with the patient today and updated as necessary.       Current Outpatient Medications   Medication Sig Dispense Refill   ??? Eliquis 5 mg tablet Take 1 Tab by mouth two (2) times a day. 180 Tab 3   ??? simvastatin (ZOCOR) 20 mg tablet TAKE 1 TABLET BY MOUTH ONCE DAILY 30 Tab 5   ??? dilTIAZem IR (CARDIZEM) 30 mg tablet Take 1 tablet po prn q 4h for afib. Heart rate >100     ??? telmisartan (MICARDIS) 80 mg tablet TAKE 1 TABLET BY MOUTH ONCE DAILY 30 Tab 5   ??? metoprolol tartrate (LOPRESSOR) 50 mg tablet TAKE 1 TABLET BY MOUTH TWICE DAILY 30 Tab 5   ??? solifenacin (VESICARE) 10 mg tablet Take 1 Tab by mouth daily. 30 Tab 5     Allergies   Allergen Reactions   ??? Omeprazole Magnesium Itching     There is no problem list on file for this patient.    Past Medical History:   Diagnosis Date   ??? Atrial fibrillation ([facility])    ??? Hypertension    ??? Overactive bladder      Past Surgical History:   Procedure Laterality Date   ??? HX APPENDECTOMY     ??? HX KNEE ARTHROSCOPY     ??? HX ORTHOPAEDIC Left 2001    knee surgery-orthroscopic   ??? HX TUBAL LIGATION  1976   ??? 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 Hypertension Mother    ??? High Cholesterol Mother    ???  Heart Attack Father    ??? Diabetes Sister    ??? Diabetes Paternal Grandmother      Social History     Tobacco Use   ??? Smoking status: Never Smoker   ??? Smokeless tobacco: Never Used   Substance Use Topics   ??? Alcohol use: Never     Frequency: Never         Review of Systems  See above    OBJECTIVE:  There were no vitals taken for this visit.     Physical Exam    Medical problems and test results were reviewed with the patient today.         ASSESSMENT and PLAN    1.  High cholesterol.  LDL 62.  Liver enzymes remain normal.  Continue statin therapy.    2.  High triglycerides.  Triglycerides are 163.  Dietary focus.    3.  Vitamin D deficiency.  Vitamin D level is 37.  Continue current supplementation.    4.  Paroxysmal atrial fibrillation.  No syncope or presyncope is reported.  Continue follow-up  with cardiology.    5.  Hypertension.  Most recent blood pressure check in the right.  Renal function and electrolytes are normal.    6.  Degenerative arthritis.  Continue follow-up with orthopedist.  No complaints today.          Consent:  This patient and/or their healthcare decision maker is aware that this patient-initiated Telehealth encounter is a billable service, with coverage as determined by their insurance carrier. Patient is aware that they may receive a bill and has provided verbal consent to proceed: Yes    I was in the office while conducting this encounter.      This virtual visit was conducted telephone encounter only. -  I affirm this is a Patient Initiated Episode with an Established Patient who has not had a related appointment within my department in the past 7 days or scheduled within the next 24 hours.  Note: this encounter is not billable if this call serves to triage the patient into an appointment for the relevant concern.      Total Time: minutes: 21-30 minutes.    Elements of this note have been dictated using speech recognition software. As a result, errors of speech recognition may have occurred.

## 2019-04-28 NOTE — Progress Notes (Signed)
SUBJECTIVE:   Brandy Vargas is a 79 y.o. female who has a past medical history significant for hypertension, high cholesterol, atrial fibrillation, overactive bladder, DJD and vitamin D deficiency.  Current medicines are listed in the EMR and reviewed today.  Review of systems reveals no complaints of chest pain, soreness of breath, orthopnea or PND.  GI and GU review of systems is unremarkable.  Patient presents today for virtual visit via telephone encounter.  ??    Patient's vital signs were not performed as encounter was performed virtually.  Location of virtual visit was patient's home.      HPI  See above    Past Medical History, Past Surgical History, Family history, Social History, and Medications were all reviewed with the patient today and updated as necessary.       Current Outpatient Medications   Medication Sig Dispense Refill   ??? Eliquis 5 mg tablet Take 1 Tab by mouth two (2) times a day. 180 Tab 3   ??? simvastatin (ZOCOR) 20 mg tablet TAKE 1 TABLET BY MOUTH ONCE DAILY 30 Tab 5   ??? dilTIAZem IR (CARDIZEM) 30 mg tablet Take 1 tablet po prn q 4h for afib. Heart rate >100     ??? telmisartan (MICARDIS) 80 mg tablet TAKE 1 TABLET BY MOUTH ONCE DAILY 30 Tab 5   ??? metoprolol tartrate (LOPRESSOR) 50 mg tablet TAKE 1 TABLET BY MOUTH TWICE DAILY 30 Tab 5   ??? solifenacin (VESICARE) 10 mg tablet Take 1 Tab by mouth daily. 30 Tab 5     Allergies   Allergen Reactions   ??? Omeprazole Magnesium Itching     There is no problem list on file for this patient.    Past Medical History:   Diagnosis Date   ??? Atrial fibrillation ([facility])    ??? Hypertension    ??? Overactive bladder      Past Surgical History:   Procedure Laterality Date   ??? HX APPENDECTOMY     ??? HX KNEE ARTHROSCOPY     ??? HX ORTHOPAEDIC Left 2001    knee surgery-orthroscopic   ??? HX TUBAL LIGATION  1976   ??? 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 Hypertension Mother    ??? High Cholesterol Mother    ???  Heart Attack Father    ??? Diabetes Sister    ??? Diabetes Paternal Grandmother      Social History     Tobacco Use   ??? Smoking status: Never Smoker   ??? Smokeless tobacco: Never Used   Substance Use Topics   ??? Alcohol use: Never     Frequency: Never         Review of Systems  See above    OBJECTIVE:  There were no vitals taken for this visit.     Physical Exam    Medical problems and test results were reviewed with the patient today.         ASSESSMENT and PLAN    1.  High cholesterol.  LDL 62.  Liver enzymes remain normal.  Continue statin therapy.    2.  High triglycerides.  Triglycerides are 163.  Dietary focus.    3.  Vitamin D deficiency.  Vitamin D level is 37.  Continue current supplementation.    4.  Paroxysmal atrial fibrillation.  No syncope or presyncope is reported.  Continue follow-up  with cardiology.    5.  Hypertension.  Most recent blood pressure check in the right.  Renal function and electrolytes are normal.    6.  Degenerative arthritis.  Continue follow-up with orthopedist.  No complaints today.          Consent:  This patient and/or their healthcare decision maker is aware that this patient-initiated Telehealth encounter is a billable service, with coverage as determined by their insurance carrier. Patient is aware that they may receive a bill and has provided verbal consent to proceed: Yes    I was in the office while conducting this encounter.      This virtual visit was conducted telephone encounter only. -  I affirm this is a Patient Initiated Episode with an Established Patient who has not had a related appointment within my department in the past 7 days or scheduled within the next 24 hours.  Note: this encounter is not billable if this call serves to triage the patient into an appointment for the relevant concern.      Total Time: minutes: 21-30 minutes.    Elements of this note have been dictated using speech recognition software. As a result, errors of speech recognition may have  occurred.

## 2019-05-01 MED ORDER — SIMVASTATIN 20 MG TAB
20 mg | ORAL_TABLET | ORAL | 3 refills | Status: DC
Start: 2019-05-01 — End: 2019-09-01

## 2019-05-01 MED ORDER — TELMISARTAN 80 MG TAB
80 mg | ORAL_TABLET | ORAL | 3 refills | Status: DC
Start: 2019-05-01 — End: 2019-08-10

## 2019-05-01 MED ORDER — METOPROLOL TARTRATE 50 MG TAB
50 mg | ORAL_TABLET | ORAL | 3 refills | Status: DC
Start: 2019-05-01 — End: 2019-06-15

## 2019-05-01 MED ORDER — SOLIFENACIN 10 MG TAB
10 mg | ORAL_TABLET | Freq: Every day | ORAL | 3 refills | Status: DC
Start: 2019-05-01 — End: 2019-07-27

## 2019-05-25 ENCOUNTER — Ambulatory Visit: Payer: MEDICARE | Primary: Family Medicine

## 2019-06-15 MED ORDER — METOPROLOL TARTRATE 50 MG TAB
50 mg | ORAL_TABLET | ORAL | 3 refills | Status: AC
Start: 2019-06-15 — End: ?

## 2019-06-23 NOTE — Telephone Encounter (Signed)
Spoke to patient - scheduled surgery

## 2019-06-23 NOTE — Telephone Encounter (Signed)
Pt ready to sched hip surg

## 2019-06-24 ENCOUNTER — Inpatient Hospital Stay: Admit: 2019-06-24 | Payer: MEDICARE | Attending: Family Medicine | Primary: Family Medicine

## 2019-06-24 DIAGNOSIS — Z1231 Encounter for screening mammogram for malignant neoplasm of breast: Secondary | ICD-10-CM

## 2019-06-30 ENCOUNTER — Encounter

## 2019-07-14 ENCOUNTER — Inpatient Hospital Stay: Admit: 2019-07-14 | Payer: MEDICARE | Primary: Family Medicine

## 2019-07-14 DIAGNOSIS — M1612 Unilateral primary osteoarthritis, left hip: Secondary | ICD-10-CM

## 2019-07-14 DIAGNOSIS — Z01818 Encounter for other preprocedural examination: Secondary | ICD-10-CM

## 2019-07-14 LAB — BASIC METABOLIC PANEL
Anion Gap: 5 mmol/L — ABNORMAL LOW (ref 7–16)
BUN: 18 MG/DL (ref 8–23)
CO2: 31 mmol/L (ref 21–32)
Calcium: 9.1 MG/DL (ref 8.3–10.4)
Chloride: 102 mmol/L (ref 98–107)
Creatinine: 0.81 MG/DL (ref 0.6–1.0)
EGFR IF NonAfrican American: >60 mL/min/{1.73_m2} (ref >60)
GFR African American: >60 mL/min/{1.73_m2} (ref >60)
Glucose: 76 mg/dL (ref 65–100)
Potassium: 4.2 mmol/L (ref 3.5–5.1)
Sodium: 138 mmol/L (ref 136–145)

## 2019-07-14 LAB — CBC WITH AUTO DIFFERENTIAL
Basophils %: 1 % (ref 0.0–2.0)
Basophils Absolute: 0.1 10*3/uL (ref 0.0–0.2)
Eosinophils %: 3 % (ref 0.5–7.8)
Eosinophils Absolute: 0.3 10*3/uL (ref 0.0–0.8)
Granulocyte Absolute Count: 0 10*3/uL (ref 0.0–0.5)
Hematocrit: 41.2 % (ref 35.8–46.3)
Hemoglobin: 13.7 g/dL (ref 11.7–15.4)
Immature Granulocytes: 0 % (ref 0.0–5.0)
Lymphocytes %: 26 % (ref 13–44)
Lymphocytes Absolute: 2.1 10*3/uL (ref 0.5–4.6)
MCH: 29.5 PG (ref 26.1–32.9)
MCHC: 33.3 g/dL (ref 31.4–35.0)
MCV: 88.6 FL (ref 79.6–97.8)
MPV: 10.8 FL (ref 9.4–12.3)
Monocytes %: 7 % (ref 4.0–12.0)
Monocytes Absolute: 0.6 10*3/uL (ref 0.1–1.3)
NRBC Absolute: 0 10*3/uL (ref 0.0–0.2)
Neutrophils %: 63 % (ref 43–78)
Neutrophils Absolute: 5.1 10*3/uL (ref 1.7–8.2)
Platelets: 178 10*3/uL (ref 150–450)
RBC: 4.65 M/uL (ref 4.05–5.2)
RDW: 13.1 % (ref 11.9–14.6)
WBC: 8.1 10*3/uL (ref 4.3–11.1)

## 2019-07-14 LAB — HEMOGLOBIN A1C W/EAG
Hemoglobin A1C: 5.5 % (ref 4.20–6.30)
eAG: 111 mg/dL

## 2019-07-14 LAB — MSSA/MRSA SC BY PCR, NASAL SWAB

## 2019-07-14 LAB — APTT: aPTT: 48.6 s — ABNORMAL HIGH (ref 24.1–35.1)

## 2019-07-14 LAB — PROTIME-INR
INR: 1.3
Protime: 16.7 s — ABNORMAL HIGH (ref 12.5–14.7)

## 2019-07-14 LAB — METABOLIC PANEL, BASIC
Anion gap: 5 mmol/L — ABNORMAL LOW (ref 7–16)
BUN: 18 MG/DL (ref 8–23)
CO2: 31 mmol/L (ref 21–32)
Calcium: 9.1 MG/DL (ref 8.3–10.4)
Chloride: 102 mmol/L (ref 98–107)
Creatinine: 0.81 MG/DL (ref 0.6–1.0)
GFR est AA: >60 mL/min/{1.73_m2} (ref >60)
GFR est non-AA: >60 mL/min/{1.73_m2} (ref >60)
Glucose: 76 mg/dL (ref 65–100)
Potassium: 4.2 mmol/L (ref 3.5–5.1)
Sodium: 138 mmol/L (ref 136–145)

## 2019-07-14 LAB — CBC WITH AUTOMATED DIFF
ABS. BASOPHILS: 0.1 10*3/uL (ref 0.0–0.2)
ABS. EOSINOPHILS: 0.3 10*3/uL (ref 0.0–0.8)
ABS. IMM. GRANS.: 0 10*3/uL (ref 0.0–0.5)
ABS. LYMPHOCYTES: 2.1 10*3/uL (ref 0.5–4.6)
ABS. MONOCYTES: 0.6 10*3/uL (ref 0.1–1.3)
ABS. NEUTROPHILS: 5.1 10*3/uL (ref 1.7–8.2)
ABSOLUTE NRBC: 0 10*3/uL (ref 0.0–0.2)
BASOPHILS: 1 % (ref 0.0–2.0)
EOSINOPHILS: 3 % (ref 0.5–7.8)
HCT: 41.2 % (ref 35.8–46.3)
HGB: 13.7 g/dL (ref 11.7–15.4)
IMMATURE GRANULOCYTES: 0 % (ref 0.0–5.0)
LYMPHOCYTES: 26 % (ref 13–44)
MCH: 29.5 PG (ref 26.1–32.9)
MCHC: 33.3 g/dL (ref 31.4–35.0)
MCV: 88.6 FL (ref 79.6–97.8)
MONOCYTES: 7 % (ref 4.0–12.0)
MPV: 10.8 FL (ref 9.4–12.3)
NEUTROPHILS: 63 % (ref 43–78)
PLATELET: 178 10*3/uL (ref 150–450)
RBC: 4.65 M/uL (ref 4.05–5.2)
RDW: 13.1 % (ref 11.9–14.6)
WBC: 8.1 10*3/uL (ref 4.3–11.1)

## 2019-07-14 LAB — PTT: aPTT: 48.6 s — ABNORMAL HIGH (ref 24.1–35.1)

## 2019-07-14 LAB — PROTHROMBIN TIME + INR
INR: 1.3
Prothrombin time: 16.7 s — ABNORMAL HIGH (ref 12.5–14.7)

## 2019-07-14 LAB — HEMOGLOBIN A1C WITH EAG
Est. average glucose: 111 mg/dL
Hemoglobin A1c: 5.5 % (ref 4.20–6.30)

## 2019-07-14 NOTE — Progress Notes (Signed)
 07/14/19 1200   Oxygen Therapy   O2 Sat (%) 98 %   Pulse via Oximetry 55 beats per minute   O2 Device None (Room air)   Pre-Treatment   Breath Sounds Bilateral Clear   Pre FEV1 (liters) 1.8 liters   % Predicted 105     Initial respiratory Assessment completed with pt. Pt was interviewed and evaluated in [facility] prior to surgery.  Patient ID:  Brandy Vargas  [id number]  79 y.o.  Mar 19, 1940  Surgeon: Dr. Monte  Date of Surgery: 07/30/2019  Procedure: Total Left Hip Arthroplasty  Primary Care Physician: Ethyl Elspeth HERO, MD [phone]  Specialists:     Pt taught proper COUGH technique  DIAPHRAGMATIC BREATHING EXERCISE INSTRUCTIONS GIVEN    History of smoking:   DENIES                 Quit date:         Secondhand smoke:DENIES    Past procedures with Oxygen desaturation or delayed awakening:DENIES    Past Medical History:   Diagnosis Date   . Arthritis    . Atrial fibrillation ([facility])    . Breast cancer ([facility])     2015   . COVID-19 vaccine series completed 04/08/2019    Moderna   . GERD (gastroesophageal reflux disease)    . Hypertension    . Overactive bladder       HX OF PNA  Respiratory history:DENIES SOB                                                                    Respiratory meds:  DENIES    FAMILY PRESENT:            SON  PAST SLEEP STUDY:                DENIES  HX OF OSA:                                  DENIES  OSA assessment:                                               SLEEPS ON SIDE        PHYSICAL EXAM   There is no height or weight on file to calculate BMI.   Visit Vitals  SpO2 (P) 98%     Neck circumference: 37.5     cm    Loud snoring:                                                          DENIES  Witnessed apnea or wakening gasping or choking:        DENIES      Awakens with headaches:  DENIES  Morning or daytime tiredness/ sleepiness:                         TIRED  Dry mouth or sore throat in morning:            YES                                               Friedman stage:  4                                   SACS score:5  Stop Bang   STOP-BANG  Does the patient snore loudly (louder than talking or loud enough to be heard through closed doors)?: No  Does the patient often feel tired, fatigued, or sleepy during the daytime, even after a good night's sleep?: Yes  Has anyone ever observed the patient stop breathing during their sleep? : No  Does the patient have or are they being treated for high blood pressure?: Yes  Is the patient's BMI greater than 35?: No  Is your neck circumference greater than 17 inches (Female) or 16 inches (Female)?: No  Is the patient older than 34?: Yes  Is the patient female?: No  OSA Score: 3  Has the patient been referred to Sleep Medicine?: No  Has the patient previously been diagnosed with Obstructive Sleep Apnea?: No                            [facility]                                        Referrals:  DECLINED  Pt. Phone Number:

## 2019-07-14 NOTE — Interval H&P Note (Signed)
[facility]           Ph.: [phone]           Fax: [phone]      Patient name: Brandy Vargas, Brandy Vargas       Date of Birth: 01/07/1940    Surgical or Medication Clearance      Requesting Physician: anesthesia  Contact Person: charge RN  Contact Ph [phone]   Contact fax # [phone]  Date of Surgery: 07/30/19  Type of surgery: Left THA  Type of Anesthesia: spinal  Medication to be held: Eliquis  Number of days requested to be held: 72 hours          Thank you for your timely response!    Pre-Admission Testing

## 2019-07-14 NOTE — Interval H&P Note (Signed)
Periop Notes by Ace Gins, RN at 07/14/19 1330                Author: Ace Gins, RN  Service: NURSING  Author Type: Registered Nurse       Filed: 07/14/19 1314  Date of Service: 07/14/19 1330  Status: Signed          Editor: Ace Gins, RN (Registered Nurse)               How to Use Your Incentive Spirometer            About Your Incentive Spirometer   An incentive spirometer is a device that will expand your lungs by helping you to breathe more deeply and fully. The parts of your incentive spirometer are labeled in Figure 1.      Using your incentive spirometer   When youre using your incentive spirometer, make sure to breathe through your mouth. If you breathe through your nose, the incentive spirometer wont work properly. You can hold your  nose if you have trouble. DO NOT BLOW INTO THE DEVICE. If you feel dizzy at any time, stop and rest. Try again at a later time.   1.  Sit upright in a chair or in bed. Hold the incentive spirometer at eye level.    2.  Put the mouthpiece in your mouth and close your lips tightly around it. Slowly breathe out (exhale) completely.   3.  Breathe in (inhale) slowly through your mouth as deeply as you can. As you take the breath, you will see the piston rise inside the large column. While the piston rises, the indicator on the right should move upwards. It should stay in between the 2 arrows  (see Figure 1).   4.  Try to get the piston as high as you can, while keeping the indicator between the arrows. If the indicator doesnt stay between the arrows, youre breathing either too fast or too slow.   5.  When you get it as high as you can, hold your breath for 10 seconds, or as long as possible. While youre holding your breath, the piston will slowly fall to the base of the spirometer.   6.  Once the piston reaches the bottom of the spirometer, breathe out slowly through your mouth. Rest for a few seconds.   7.  Repeat 10 times. Try to get the piston  to the same level with each breath.   8.  After each set of 10 breaths, try to cough as coughing will help loosen or clear any mucus in your lungs.   9.  Put the marker at the level the piston reached on your incentive spirometer. This will be your goal next time.   Repeat these steps every hour that youre awake.   Cover the mouthpiece of the incentive spirometer when you arent using it

## 2019-07-14 NOTE — Interval H&P Note (Signed)
 Eliquis  hold note in EMR dated 07/21/19 that reads,     AL   07/21/19 1:13 PM  Lovelace, Donzell PARAS routed this conversation to Bittrick, Thom HERO, MD   Bittrick, Thom HERO, MD  to Tenny Donzell PARAS FOYE   07/21/19 1:47 PM  Note     Low risk. Ok to hold 3 days

## 2019-07-14 NOTE — Interval H&P Note (Signed)
PLEASE CONTINUE TAKING ALL PRESCRIPTION MEDICATIONS UP TO THE DAY OF SURGERY UNLESS OTHERWISE DIRECTED BELOW.    DISCONTINUE all vitamins and supplements 21 days prior to surgery. DISCONTINUE Non-Steriodal Anti-Inflammatory (NSAIDS) such as Advil and Aleve 5 days prior to surgery.     Home Medications to take  the day of surgery   Metoprolol  Solifenacin   Aspirin 81mg         Home Medications   to Hold   Hold Eliquis for 72 hours prior to surgery and start Aspirin 81mg  every morning while off of Eliquis        Comments   Please bring bottle of soap (Dynahex) and incentive spirometer to the hospital on the day of surgery.    On the day before surgery please take Acetaminophen 1000mg  in the morning and then again before bed. You may substitute for Tylenol 650 mg.           Please do not bring home medications with you on the day of surgery unless otherwise directed by your nurse.  If you are instructed to bring home medications, please give them to your nurse as they will be administered by the nursing staff.    If you have any questions, please call [facility] [phone]   A copy of this note was provided to the patient for reference.

## 2019-07-14 NOTE — Progress Notes (Signed)
Total Joint Surgery Preoperative Chart Review      Patient ID:  Brandy Vargas  [id number]  79 y.o.  1940-10-19  Surgeon: Dr. Bonnye Fava  Date of Surgery: 07/30/2019  Procedure: Total Left Hip Arthroplasty  Primary Care Physician: Delana Meyer, MD [phone]  Specialty Physician(s):      Subjective:   Brandy Vargas is a 78 y.o. WHITE/NON-HISPANIC female who presents for preoperative evaluation for Total Left Hip arthroplasty.    This is a preoperative chart review note based on data collected by the nurse at the surgical Pre-Assessment visit.    Past Medical History:   Diagnosis Date   ??? Arthritis    ??? Atrial fibrillation (HCC)    ??? Breast cancer (HCC)     2015   ??? COVID-19 vaccine series completed 04/08/2019    Moderna   ??? GERD (gastroesophageal reflux disease)    ??? Hypertension    ??? Overactive bladder       Past Surgical History:   Procedure Laterality Date   ??? HX APPENDECTOMY     ??? HX BREAST LUMPECTOMY Right     2015   ??? HX KNEE ARTHROSCOPY     ??? HX TUBAL LIGATION  1976   ??? 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 Hypertension Mother    ??? High Cholesterol Mother    ??? Heart Attack Father    ??? Diabetes Sister    ??? Diabetes Paternal Grandmother    ??? Breast Cancer Maternal Aunt       Social History     Tobacco Use   ??? Smoking status: Never Smoker   ??? Smokeless tobacco: Never Used   Substance Use Topics   ??? Alcohol use: Never       Prior to Admission medications    Medication Sig Start Date End Date Taking? Authorizing Provider   calcium carbonate (OS-CAL) 500 mg calcium (1,250 mg) tablet Take 1 Tablet by mouth daily.   Yes Provider, Historical   cholecalciferol (VITAMIN D3) (2,000 UNITS /50 MCG) cap capsule Take 2,000 Units by mouth daily.   Yes Provider, Historical   metoprolol tartrate (LOPRESSOR) 50 mg tablet TAKE 1 TABLET BY MOUTH TWICE DAILY 06/15/19  Yes Delana Meyer, MD   simvastatin (ZOCOR) 20 mg tablet TAKE 1 TABLET BY MOUTH ONCE  DAILY  Patient taking differently: Take 20 mg by mouth nightly. 05/01/19  Yes Delana Meyer, MD   solifenacin (VESICARE) 10 mg tablet Take 1 Tab by mouth daily. 05/01/19  Yes Delana Meyer, MD   telmisartan (MICARDIS) 80 mg tablet TAKE 1 TABLET BY MOUTH ONCE DAILY  Patient taking differently: Take 80 mg by mouth nightly. TAKE 1 TABLET BY MOUTH ONCE DAILY 05/01/19  Yes Delana Meyer, MD   Eliquis 5 mg tablet Take 1 Tab by mouth two (2) times a day. 04/17/19  Yes Bittrick, Karie Kirks, MD   dilTIAZem IR (CARDIZEM) 30 mg tablet Take 1 tablet po prn q 4h for afib. Heart rate >100    Provider, Historical     Allergies   Allergen Reactions   ??? Omeprazole Magnesium Itching          Objective:     Physical Exam:   No data found.    ECG:    EKG Results     None  Data Review:   Labs:   Recent Results (from the past 24 hour(s))   CBC WITH AUTOMATED DIFF    Collection Time: 07/14/19 11:55 AM   Result Value Ref Range    WBC 8.1 4.3 - 11.1 K/uL    RBC 4.65 4.05 - 5.2 M/uL    HGB 13.7 11.7 - 15.4 g/dL    HCT 11.9 41.7 - 40.8 %    MCV 88.6 79.6 - 97.8 FL    MCH 29.5 26.1 - 32.9 PG    MCHC 33.3 31.4 - 35.0 g/dL    RDW 14.4 81.8 - 56.3 %    PLATELET 178 150 - 450 K/uL    MPV 10.8 9.4 - 12.3 FL    ABSOLUTE NRBC 0.00 0.0 - 0.2 K/uL    DF AUTOMATED      NEUTROPHILS 63 43 - 78 %    LYMPHOCYTES 26 13 - 44 %    MONOCYTES 7 4.0 - 12.0 %    EOSINOPHILS 3 0.5 - 7.8 %    BASOPHILS 1 0.0 - 2.0 %    IMMATURE GRANULOCYTES 0 0.0 - 5.0 %    ABS. NEUTROPHILS 5.1 1.7 - 8.2 K/UL    ABS. LYMPHOCYTES 2.1 0.5 - 4.6 K/UL    ABS. MONOCYTES 0.6 0.1 - 1.3 K/UL    ABS. EOSINOPHILS 0.3 0.0 - 0.8 K/UL    ABS. BASOPHILS 0.1 0.0 - 0.2 K/UL    ABS. IMM. GRANS. 0.0 0.0 - 0.5 K/UL   HEMOGLOBIN A1C WITH EAG    Collection Time: 07/14/19 11:55 AM   Result Value Ref Range    Hemoglobin A1c 5.5 4.2 - 6.3 %    Est. average glucose 111 mg/dL   METABOLIC PANEL, BASIC    Collection Time: 07/14/19 11:55 AM   Result Value Ref Range    Sodium 138 136 - 145 mmol/L     Potassium 4.2 3.5 - 5.1 mmol/L    Chloride 102 98 - 107 mmol/L    CO2 31 21 - 32 mmol/L    Anion gap 5 (L) 7 - 16 mmol/L    Glucose 76 65 - 100 mg/dL    BUN 18 8 - 23 MG/DL    Creatinine 1.49 0.6 - 1.0 MG/DL    GFR est AA >70 >26 VZ/CHY/8.50Y7    GFR est non-AA >60 >60 ml/min/1.30m2    Calcium 9.1 8.3 - 10.4 MG/DL   PROTHROMBIN TIME + INR    Collection Time: 07/14/19 11:55 AM   Result Value Ref Range    Prothrombin time 16.7 (H) 12.5 - 14.7 sec    INR 1.3     PTT    Collection Time: 07/14/19 11:55 AM   Result Value Ref Range    aPTT 48.6 (H) 24.1 - 35.1 SEC         Problem List:  )  Patient Active Problem List   Diagnosis Code   ??? 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 Vitamin D deficiency E55.9   ??? Paroxysmal atrial fibrillation (HCC) I48.0   ??? Essential hypertension I10   ??? 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 O2 prn per respiratory protocol.     Signed By: Robie Ridge, NP-C    July 14, 2019

## 2019-07-14 NOTE — Interval H&P Note (Signed)
Patient verified name and DOB.    Order for consent was found in EHR and matches case posting; patient verified.   left total hip arthroplasty ONE TIME Routine      Type 3 surgery, PAT joint camp assessment complete.    Labs per surgeon: CBC,BMP, PT/PTT, HgbA1c and MRSA swab ; results pending  Labs per anesthesia protocol: no additional  RNH:AFBX 02/03/2019 and will have anesthesia to review. Latest EKG done 07/10/2018 faxed from [facility] in [place] for anesthesia reference.    Pt has hx of A Fib. Recently moved to [facility] from [place] and is now being followed by Dr. Nunzio Cory at [facility]. Last office note 02/03/2019 and found in Chart Review for anesthesia reference :Latest ECHO report 05/27/2018 with LVEF 60-65% noted and found in Care Everywhere for anesthesia reference. NM stress test result from 07/10/2016 also found in Care Everywhere for anesthesia reference.    Patient has completed Moderna COVID vaccine series. Vaccination record card scanned into EHR      MRSA/MSSA swab collected; pharmacy to review and dose antibiotic as appropriate.     Hospital approved surgical skin cleanser and instructions to return bottle on DOS given per hospital policy.    Patient provided with handouts including Guide to Surgery, Pain Management, Hand Hygiene, Blood Transfusion Education, and Chief Financial Officer.    Patient answered medical/surgical history questions at their best of ability. All prior to admission medications documented in [facility]. Original medication prescription bottle not visualized during patient appointment.     Patient instructed to hold all vitamins 3 weeks prior to surgery and NSAIDS 5 days prior to surgery.     Patient teach back successful and patient demonstrates knowledge of instruction.

## 2019-07-14 NOTE — Interval H&P Note (Signed)
Dr. Lynwood Dawley, anesthesia, reviewed chart including elevated pt/ptt done 07/14/19, and ekg's 02/03/19, 07/10/18.  Ordered repeat PT/PTT DOS. OK to proceed.

## 2019-07-14 NOTE — Progress Notes (Signed)
 Progress Notes by Zandra Anette PARAS, PT at 07/14/19 1200                Author: Zandra Anette PARAS, PT  Service: Orthopedic Surgery  Author Type: Physical Therapist       Filed: 07/14/19 1236  Date of Service: 07/14/19 1200  Status: Signed           Editor: Zandra Anette PARAS, PT (Physical Therapist)  Cosigner: Brandy Lonni DEL, MD at 07/14/19 1256                  Brandy Vargas   DOB: 04-20-1940(79 y.o. 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 Pt's son present, whose  mother in law and wife are in the hospital currently for mother in law surgery. Pt is a widow. Pt plans to return home with assistance from son following hospital stay. Pt states she has two bad knees, and pain from hip recently began radiating down her  left lower extremity from hip to shin.            PROBLEM LIST (Impacting functional limitations):   Brandy Vargas presents with the following left lower extremity(s) problems:   1.  Transfers   2.  Gait   3.  Strength   4.  Range of Motion   5.  Pain     INTERVENTIONS PLANNED:   1.  Home Exercise Program   2.  Educational Discussion            TREATMENT PLAN: Effective Dates: 07/14/2019 TO  07/14/2019.  Frequency/Duration: Patient to continue to perform home exercise program at least twice per day up until  her surgery.      GOALS: (Goals have been discussed and agreed upon with patient.)   Discharge Goals: Time Frame: 1 Day   1.  Patient will demonstrate independence with a home exercise program designed to increase strength, range of motion and  pain control to minimize functional deficits and optimize patient for total joint replacement.   Rehabilitation Potential For Stated Goals: Good   Regarding Brandy Vargas's therapy, I certify that the treatment plan above will be carried out by a therapist or under their direction.   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  Brandy Vargas  has a past surgical history  that includes hx appendectomy; hx tubal ligation (1976); hx orthopaedic (Left, 2001); pr breast surgery procedure unlisted (2015); hx knee arthroscopy; and hx breast lumpectomy (Right).   Social History/Living Environment:    Home Environment: Private residence   # Steps to Enter: 5   One/Two Story Residence: One story   Living Alone: Yes   Support Systems: Child(ren)   Patient Expects to be Discharged to:: [facility]:   retired   Dominant Side:   RIGHT   Current Medications:  See Scientific laboratory technician nursing note        Number of Personal Factors/Comorbidities that affect the Plan of Care:  0: LOW COMPLEXITY        EXAMINATION:       ADLs (Current Functional Status):     Ambulation:   []   Independent   [x]   Walk Indoors Only   []   Walk Outdoors   []   Use Assistive Device rollator   []   Use Wheelchair Only  Dressing:   [x]   Independent   Requires  Assistance from Someone for:   []   Sock/Shoes   []   Pants   []   Everything      Bathing/Showering:    []   Independent   []   Requires Assistance from Someone   []   Sponge Bath Only  Household Activities:   []   Routine house and yard work   [x]   Light Housework Only   []   None        Observation/Orthostatic Postural Assessment:    Within defined limits    ROM/Flexibility:    Gross Assessment: Yes   AROM: Generally decreased, functional                                         Strength:    Gross Assessment: Yes   Strength: Generally decreased, functional                             Functional Mobility:     Gross Assessment: Yes   Sensation: Intact      Gait Description (WDL): Within defined limits   Stand to Sit: Independent   Sit to Stand: Independent   Distance (ft): 300 Feet (ft)   Ambulation - Level of Assistance: Modified independent   Assistive Device: Walker, rollator   Gait Abnormalities: Antalgic             Balance:     Sitting: Intact   Standing: Intact         Body Structures Involved:   1.  Bones   2.  Joints   3.  Muscles   4.  Ligaments  Body Functions Affected:   1.  Neuromusculoskeletal   2.  Movement Related  Activities and Participation Affected:   1.  Mobility        Number of elements that affect the Plan of Care:  4+: HIGH COMPLEXITY        CLINICAL PRESENTATION:  Presentation:  Stable and uncomplicated: LOW COMPLEXITY        CLINICAL DECISION MAKING:       Outcome Measure:    Tool Used: Lower Extremity Functional Scale (LEFS)   Score:   Initial: 10/80  Most Recent: X/80 (Date: --  )        Interpretation of Score: 20 questions each scored on a 5 point scale with 0 representing extreme difficulty or unable to perform and 4 representing  no difficulty.  The lower the score, the greater the functional disability. 80/80 represents no disability.  Minimal detectable change is 9 points.      Medical Necessity:      Brandy Vargas is expected to optimize her lower extremity strength and ROM in  preparation for joint replacement surgery.   Reason for Services/Other Comments:     Achieve baseline assesment of musculoskeletal system, functional mobility and home environment., educate in PT HEP in preparation  for surgery, educate in hospital plan of care.        Use of outcome tool(s) and clinical judgement create a POC that gives a:  Clear prediction of patient's progress: LOW COMPLEXITY       TREATMENT:       Treatment/Session Assessment:  Patient  was instructed in PT- HEP to increase strength and ROM in LEs.  Answered all questions.     Post session pain:  9     Compliance with Program/Exercises: compliant most of the time.   Total Treatment Duration:   PT Patient Time In/Time Out   Time In: 1230   Time Out: 1300      Anette JINNY Southward, PT

## 2019-07-17 NOTE — Telephone Encounter (Signed)
Patient states she needs appts at [facility] please because she know nothinga bout the [place] area

## 2019-07-21 NOTE — Telephone Encounter (Signed)
Cardiac risk assessment and medication hold needed for left THA    Last cath: none listed  Med hold: eliquis  3 days prior

## 2019-07-21 NOTE — Telephone Encounter (Signed)
Cardiac Clearance      Surgical, Procedural, or Medication Clearance      Physician or Practice Requesting Clearance: [facility]  Contact Person: French Ana  Contact Phone Number: [phone]  Fax Number: [phone]  Date of Surgery/Procedure: 07/30/19  Type of Surgery or Procedure:  Left THA  Type of Anesthesia: Spinal   Medication to Hold: Eliquis  Days to Hold: 72 hrs

## 2019-07-21 NOTE — Telephone Encounter (Signed)
Low risk. Ok to hold 3 days

## 2019-07-22 ENCOUNTER — Ambulatory Visit: Attending: Orthopaedic Surgery | Primary: Family Medicine

## 2019-07-22 ENCOUNTER — Encounter

## 2019-07-22 ENCOUNTER — Ambulatory Visit: Admit: 2019-07-22 | Discharge: 2019-07-22 | Payer: MEDICARE | Attending: Orthopaedic Surgery | Primary: Family Medicine

## 2019-07-22 DIAGNOSIS — M1612 Unilateral primary osteoarthritis, left hip: Secondary | ICD-10-CM

## 2019-07-22 NOTE — Progress Notes (Signed)
Name: Brandy Vargas  Date of Birth: 1940-09-12  Gender: female  MRN: [id number]      Current Outpatient Medications:   ???  calcium carbonate (OS-CAL) 500 mg calcium (1,250 mg) tablet, Take 1 Tablet by mouth daily., Disp: , Rfl:   ???  cholecalciferol (VITAMIN D3) (2,000 UNITS /50 MCG) cap capsule, Take 2,000 Units by mouth daily., Disp: , Rfl:   ???  metoprolol tartrate (LOPRESSOR) 50 mg tablet, TAKE 1 TABLET BY MOUTH TWICE DAILY, Disp: 180 Tablet, Rfl: 3  ???  simvastatin (ZOCOR) 20 mg tablet, TAKE 1 TABLET BY MOUTH ONCE DAILY (Patient taking differently: Take 20 mg by mouth nightly.), Disp: 90 Tab, Rfl: 3  ???  solifenacin (VESICARE) 10 mg tablet, Take 1 Tab by mouth daily., Disp: 90 Tab, Rfl: 3  ???  telmisartan (MICARDIS) 80 mg tablet, TAKE 1 TABLET BY MOUTH ONCE DAILY (Patient taking differently: Take 80 mg by mouth nightly. TAKE 1 TABLET BY MOUTH ONCE DAILY), Disp: 90 Tab, Rfl: 3  ???  Eliquis 5 mg tablet, Take 1 Tab by mouth two (2) times a day., Disp: 180 Tab, Rfl: 3  ???  dilTIAZem IR (CARDIZEM) 30 mg tablet, Take 1 tablet po prn q 4h for afib. Heart rate >100, Disp: , Rfl:   Allergies   Allergen Reactions   ??? Omeprazole Magnesium Itching       Pre-op  exam Left THA  CC:  Left hip pain    History:  Patient returns today for pre-op exam prior to Left THA.  See formal H&P in Epic chart.

## 2019-07-22 NOTE — H&P (Signed)
[facility]  History and Physical Exam    Patient ID:  Brandy Vargas  [id number]    79 y.o.  [birth year]    Today: July 22, 2019    Vitals Signs: Reviewed as noted in medical record.    Allergies:   Allergies   Allergen Reactions   ??? Omeprazole Magnesium Itching       CC: Left hip pain    HPI:  Pt complains of left hip pain and difficulty ambulating.     Relevant PMH:   Past Medical History:   Diagnosis Date   ??? Arthritis    ??? Atrial fibrillation (HCC)    ??? Breast cancer (HCC)     2015   ??? COVID-19 vaccine series completed 04/08/2019    Moderna   ??? GERD (gastroesophageal reflux disease)    ??? Hypertension    ??? Overactive bladder        Objective:                    HEENT: NC/AT                   Lungs:  clear                   Heart:   rrr                   Abdomen: soft                   Extremities:  Pain with rom of the left hip joint    Radiographs: reveal osteoarthritis with loss of joint space and bone spurs.    Assessment: Arthritis of left hip [M16.12]    Plan:  Proceed with scheduled Procedure(s) (LRB):  LEFT HIP ARTHROPLASTY TOTAL / DEPUY (Left) .  The patient has failed conservative treatment including NSAIDS, and injections.  Due to the amount of pain the patient is experiencing we will proceed with scheduled procedure.  It is also felt that the patient is high risk for postoperative complication due to history of multiple chronic medical problems.  The patient may potentially spend 2 nights in the hospital.      Signed By: Sheral Flow, PA  July 22, 2019

## 2019-07-23 NOTE — Telephone Encounter (Signed)
Letter faxed.

## 2019-07-24 ENCOUNTER — Ambulatory Visit: Payer: MEDICARE | Primary: Family Medicine

## 2019-07-24 ENCOUNTER — Encounter: Attending: Orthopaedic Surgery | Primary: Family Medicine

## 2019-07-27 MED ORDER — SOLIFENACIN 10 MG TAB
10 mg | ORAL_TABLET | ORAL | 0 refills | Status: DC
Start: 2019-07-27 — End: 2019-09-15

## 2019-07-29 ENCOUNTER — Encounter

## 2019-07-29 ENCOUNTER — Inpatient Hospital Stay: Admit: 2019-07-29 | Payer: MEDICARE | Attending: Family Medicine | Primary: Family Medicine

## 2019-07-29 ENCOUNTER — Inpatient Hospital Stay: Payer: MEDICARE | Attending: Family Medicine | Primary: Family Medicine

## 2019-07-29 DIAGNOSIS — R928 Other abnormal and inconclusive findings on diagnostic imaging of breast: Secondary | ICD-10-CM

## 2019-07-30 ENCOUNTER — Inpatient Hospital Stay: Admit: 2019-07-30 | Payer: MEDICARE | Primary: Family Medicine

## 2019-07-30 ENCOUNTER — Inpatient Hospital Stay
Admit: 2019-07-30 | Discharge: 2019-08-01 | Disposition: A | Discharge status: Home Health | Payer: MEDICARE | Source: Ambulatory Visit | Attending: Orthopaedic Surgery | Admitting: Orthopaedic Surgery

## 2019-07-30 DIAGNOSIS — M1612 Unilateral primary osteoarthritis, left hip: Secondary | ICD-10-CM

## 2019-07-30 LAB — PROTIME-INR
INR: 1
Protime: 13.8 s (ref 12.5–14.7)

## 2019-07-30 LAB — APTT: aPTT: 39.2 s — ABNORMAL HIGH (ref 24.1–35.1)

## 2019-07-30 LAB — PROTHROMBIN TIME + INR
INR: 1
Prothrombin time: 13.8 s (ref 12.5–14.7)

## 2019-07-30 LAB — PTT: aPTT: 39.2 s — ABNORMAL HIGH (ref 24.1–35.1)

## 2019-07-30 MED ORDER — DEXAMETHASONE SODIUM PHOSPHATE 10 MG/ML IJ SOLN
10 mg/mL | Freq: Once | INTRAMUSCULAR | Status: AC
Start: 2019-07-30 — End: 2019-07-31
  Administered 2019-07-31: 22:00:00 via INTRAVENOUS

## 2019-07-30 MED ORDER — VANCOMYCIN 1,000 MG IV SOLR
1000 mg | INTRAVENOUS | Status: AC
Start: 2019-07-30 — End: ?

## 2019-07-30 MED ORDER — HYDROMORPHONE 1 MG/ML INJECTION SOLUTION
1 mg/mL | INTRAMUSCULAR | Status: DC | PRN
Start: 2019-07-30 — End: 2019-08-01

## 2019-07-30 MED ORDER — EPHEDRINE (PF) 50 MG/5 ML (10 MG/ML) IN NS IV SYRINGE
50 mg/5 mL (10 mg/mL) | INTRAVENOUS | Status: DC | PRN
Start: 2019-07-30 — End: 2019-07-30
  Administered 2019-07-30 (×2): via INTRAVENOUS

## 2019-07-30 MED ORDER — CELECOXIB 200 MG CAP
200 mg | Freq: Two times a day (BID) | ORAL | Status: DC
Start: 2019-07-30 — End: 2019-08-01
  Administered 2019-07-31 – 2019-08-01 (×4): via ORAL

## 2019-07-30 MED ORDER — KETOROLAC TROMETHAMINE 30 MG/ML INJECTION
30 mg/mL (1 mL) | INTRAMUSCULAR | Status: DC | PRN
Start: 2019-07-30 — End: 2019-07-30
  Administered 2019-07-30: 15:00:00 via INTRA_ARTICULAR

## 2019-07-30 MED ORDER — ASPIRIN 81 MG TAB, DELAYED RELEASE
81 mg | Freq: Two times a day (BID) | ORAL | Status: DC
Start: 2019-07-30 — End: 2019-08-01
  Administered 2019-07-31 – 2019-08-01 (×4): via ORAL

## 2019-07-30 MED ORDER — CEFAZOLIN 2 GRAM/20 ML IN STERILE WATER INTRAVENOUS SYRINGE
2 gram/0 mL | Freq: Three times a day (TID) | INTRAVENOUS | Status: AC
Start: 2019-07-30 — End: 2019-07-31
  Administered 2019-07-30 – 2019-07-31 (×2): via INTRAVENOUS

## 2019-07-30 MED ORDER — ONDANSETRON (PF) 4 MG/2 ML INJECTION
4 mg/2 mL | INTRAMUSCULAR | Status: AC
Start: 2019-07-30 — End: ?

## 2019-07-30 MED ORDER — SODIUM CHLORIDE 0.9 % IJ SYRG
Freq: Three times a day (TID) | INTRAMUSCULAR | Status: DC
Start: 2019-07-30 — End: 2019-08-01
  Administered 2019-07-31 – 2019-08-01 (×5): via INTRAVENOUS

## 2019-07-30 MED ORDER — EPHEDRINE 50 MG/5 ML (10 MG/ML) IN NS IV SYRINGE
50 mg/5 mL (10 mg/mL) | INTRAVENOUS | Status: AC
Start: 2019-07-30 — End: ?

## 2019-07-30 MED ORDER — PROPOFOL 10 MG/ML IV EMUL
10 mg/mL | INTRAVENOUS | Status: DC | PRN
Start: 2019-07-30 — End: 2019-07-30
  Administered 2019-07-30: 15:00:00 via INTRAVENOUS

## 2019-07-30 MED ORDER — DEXAMETHASONE SODIUM PHOSPHATE 10 MG/ML IJ SOLN
10 mg/mL | INTRAMUSCULAR | Status: DC | PRN
Start: 2019-07-30 — End: 2019-07-30
  Administered 2019-07-30: 15:00:00 via INTRAVENOUS

## 2019-07-30 MED ORDER — LACTATED RINGERS IV
INTRAVENOUS | Status: DC
Start: 2019-07-30 — End: 2019-07-30
  Administered 2019-07-30 (×2): via INTRAVENOUS

## 2019-07-30 MED ORDER — DEXAMETHASONE SODIUM PHOSPHATE 10 MG/ML IJ SOLN
10 mg/mL | INTRAMUSCULAR | Status: AC
Start: 2019-07-30 — End: ?

## 2019-07-30 MED ORDER — SODIUM CHLORIDE 0.9 % IV
1000 mg/10 mL (100 mg/mL) | INTRAVENOUS | Status: DC | PRN
Start: 2019-07-30 — End: 2019-07-30
  Administered 2019-07-30: 15:00:00 via INTRA_ARTICULAR

## 2019-07-30 MED ORDER — MIDAZOLAM 1 MG/ML IJ SOLN
1 mg/mL | INTRAMUSCULAR | Status: AC
Start: 2019-07-30 — End: ?

## 2019-07-30 MED ORDER — NALOXONE 0.4 MG/ML INJECTION
0.4 mg/mL | INTRAMUSCULAR | Status: DC | PRN
Start: 2019-07-30 — End: 2019-08-01

## 2019-07-30 MED ORDER — SODIUM CHLORIDE 0.9 % IV
10 mg/mL | INTRAVENOUS | Status: DC | PRN
Start: 2019-07-30 — End: 2019-07-30
  Administered 2019-07-30 (×2): via INTRAVENOUS

## 2019-07-30 MED ORDER — ALCOHOL 62% (NOZIN) NASAL SANITIZER
62 % | Freq: Two times a day (BID) | CUTANEOUS | Status: DC
Start: 2019-07-30 — End: 2019-08-01
  Administered 2019-07-31 – 2019-08-01 (×4): via TOPICAL

## 2019-07-30 MED ORDER — SODIUM CHLORIDE 0.9 % IV
INTRAVENOUS | Status: AC
Start: 2019-07-30 — End: 2019-08-01

## 2019-07-30 MED ORDER — PROPOFOL 10 MG/ML IV EMUL
10 mg/mL | INTRAVENOUS | Status: AC
Start: 2019-07-30 — End: ?

## 2019-07-30 MED ORDER — OXYCODONE 5 MG TAB
5 mg | ORAL | Status: DC | PRN
Start: 2019-07-30 — End: 2019-08-01
  Administered 2019-07-30 – 2019-08-01 (×3): via ORAL

## 2019-07-30 MED ORDER — TRANEXAMIC ACID 1,000 MG/10 ML (100 MG/ML) IV
1000 mg/10 mL (100 mg/mL) | INTRAVENOUS | Status: AC
Start: 2019-07-30 — End: ?

## 2019-07-30 MED ORDER — SODIUM CHLORIDE 0.9 % IJ SYRG
INTRAMUSCULAR | Status: DC | PRN
Start: 2019-07-30 — End: 2019-08-01

## 2019-07-30 MED ORDER — KETOROLAC TROMETHAMINE 30 MG/ML INJECTION
30 mg/mL (1 mL) | INTRAMUSCULAR | Status: AC
Start: 2019-07-30 — End: ?

## 2019-07-30 MED ORDER — VALSARTAN 320 MG TAB
320 mg | Freq: Every evening | ORAL | Status: DC
Start: 2019-07-30 — End: 2019-08-01
  Administered 2019-07-31: 02:00:00 via ORAL

## 2019-07-30 MED ORDER — LIDOCAINE (PF) 20 MG/ML (2 %) IJ SOLN
20 mg/mL (2 %) | INTRAMUSCULAR | Status: AC
Start: 2019-07-30 — End: ?

## 2019-07-30 MED ORDER — BUPIVACAINE-DEXTROSE-WATER(PF) 7.5 MG/ML (0.75 %) (SPINAL) IJ SOLN
0.75 % (7.5 mg/mL) | INTRAMUSCULAR | Status: DC
Start: 2019-07-30 — End: 2019-07-30
  Administered 2019-07-30: 15:00:00 via INTRATHECAL

## 2019-07-30 MED ORDER — PHENYLEPHRINE 10 MG/ML INJECTION
10 mg/mL | INTRAMUSCULAR | Status: DC | PRN
Start: 2019-07-30 — End: 2019-07-30
  Administered 2019-07-30: 15:00:00 via INTRAVENOUS

## 2019-07-30 MED ORDER — LIDOCAINE (PF) 20 MG/ML (2 %) IJ SOLN
20 mg/mL (2 %) | INTRAMUSCULAR | Status: DC | PRN
Start: 2019-07-30 — End: 2019-07-30
  Administered 2019-07-30: 15:00:00 via INTRAVENOUS

## 2019-07-30 MED ORDER — ACETAMINOPHEN 650 MG RECTAL SUPPOSITORY
650 mg | Freq: Once | RECTAL | Status: DC
Start: 2019-07-30 — End: 2019-07-30

## 2019-07-30 MED ORDER — BUPIVACAINE-DEXTROSE-WATER(PF) 7.5 MG/ML (0.75 %) (SPINAL) IJ SOLN
0.75 % (7.5 mg/mL) | INTRAMUSCULAR | Status: AC
Start: 2019-07-30 — End: ?

## 2019-07-30 MED ORDER — CEFAZOLIN 2 GRAM/20 ML IN STERILE WATER INTRAVENOUS SYRINGE
2 gram/0 mL | Freq: Once | INTRAVENOUS | Status: AC
Start: 2019-07-30 — End: 2019-07-30
  Administered 2019-07-30: 14:00:00 via INTRAVENOUS

## 2019-07-30 MED ORDER — TROSPIUM 20 MG TAB
20 mg | Freq: Every day | ORAL | Status: DC
Start: 2019-07-30 — End: 2019-08-01
  Administered 2019-07-31 – 2019-08-01 (×2): via ORAL

## 2019-07-30 MED ORDER — DIPHENHYDRAMINE 25 MG CAP
25 mg | ORAL | Status: DC | PRN
Start: 2019-07-30 — End: 2019-08-01

## 2019-07-30 MED ORDER — ACETAMINOPHEN 325 MG TABLET
325 mg | Freq: Once | ORAL | Status: DC
Start: 2019-07-30 — End: 2019-07-30

## 2019-07-30 MED ORDER — ROPIVACAINE 2 MG/ML INJECTION
2 mg/mL (0. %) | INTRAMUSCULAR | Status: DC | PRN
Start: 2019-07-30 — End: 2019-07-30
  Administered 2019-07-30: 15:00:00 via INTRA_ARTICULAR

## 2019-07-30 MED ORDER — TRANEXAMIC ACID 1,000 MG/10 ML (100 MG/ML) IV
1000 mg/10 mL (100 mg/mL) | INTRAVENOUS | Status: DC | PRN
Start: 2019-07-30 — End: 2019-07-30
  Administered 2019-07-30: 15:00:00 via INTRAVENOUS

## 2019-07-30 MED ORDER — VANCOMYCIN 1,000 MG IV SOLR
1000 mg | INTRAVENOUS | Status: DC | PRN
Start: 2019-07-30 — End: 2019-07-30
  Administered 2019-07-30: 15:00:00 via INTRA_ARTICULAR

## 2019-07-30 MED ORDER — ACETAMINOPHEN 500 MG TAB
500 mg | Freq: Four times a day (QID) | ORAL | Status: DC
Start: 2019-07-30 — End: 2019-08-01
  Administered 2019-07-30 – 2019-08-01 (×7): via ORAL

## 2019-07-30 MED ORDER — ONDANSETRON (PF) 4 MG/2 ML INJECTION
4 mg/2 mL | INTRAMUSCULAR | Status: DC | PRN
Start: 2019-07-30 — End: 2019-07-30
  Administered 2019-07-30: 15:00:00 via INTRAVENOUS

## 2019-07-30 MED ORDER — SENNOSIDES-DOCUSATE SODIUM 8.6 MG-50 MG TAB
Freq: Every day | ORAL | Status: DC
Start: 2019-07-30 — End: 2019-08-01
  Administered 2019-07-31 – 2019-08-01 (×2): via ORAL

## 2019-07-30 MED ORDER — METOPROLOL TARTRATE 50 MG TAB
50 mg | Freq: Two times a day (BID) | ORAL | Status: DC
Start: 2019-07-30 — End: 2019-08-01
  Administered 2019-07-31: 02:00:00 via ORAL

## 2019-07-30 MED ORDER — ONDANSETRON 4 MG TAB, RAPID DISSOLVE
4 mg | ORAL | Status: DC | PRN
Start: 2019-07-30 — End: 2019-08-01
  Administered 2019-07-30: 19:00:00 via ORAL

## 2019-07-30 MED ORDER — ROPIVACAINE 2 MG/ML INJECTION
2 mg/mL (0. %) | INTRAMUSCULAR | Status: AC
Start: 2019-07-30 — End: ?

## 2019-07-30 MED ORDER — ALCOHOL 62% (NOZIN) NASAL SANITIZER
62 % | Freq: Once | CUTANEOUS | Status: AC
Start: 2019-07-30 — End: 2019-07-30
  Administered 2019-07-30: 13:00:00 via TOPICAL

## 2019-07-30 MED FILL — NAROPIN (PF) 2 MG/ML (0.2 %) INJECTION SOLUTION: 2 mg/mL (0. %) | INTRAMUSCULAR | Qty: 60

## 2019-07-30 MED FILL — TRANEXAMIC ACID 1,000 MG/10 ML (100 MG/ML) IV: 1000 mg/10 mL (100 mg/mL) | INTRAVENOUS | Qty: 10

## 2019-07-30 MED FILL — TYLENOL EXTRA STRENGTH 500 MG TABLET: 500 mg | ORAL | Qty: 2

## 2019-07-30 MED FILL — SODIUM CHLORIDE 0.9 % IV: INTRAVENOUS | Qty: 1000

## 2019-07-30 MED FILL — PROPOFOL 10 MG/ML IV EMUL: 10 mg/mL | INTRAVENOUS | Qty: 40

## 2019-07-30 MED FILL — KETOROLAC TROMETHAMINE 30 MG/ML INJECTION: 30 mg/mL (1 mL) | INTRAMUSCULAR | Qty: 1

## 2019-07-30 MED FILL — VANCOMYCIN 1,000 MG IV SOLR: 1000 mg | INTRAVENOUS | Qty: 1000

## 2019-07-30 MED FILL — EPHEDRINE 50 MG/5 ML (10 MG/ML) IN NS IV SYRINGE: 50 mg/5 mL (10 mg/mL) | INTRAVENOUS | Qty: 5

## 2019-07-30 MED FILL — DEXAMETHASONE SODIUM PHOSPHATE 10 MG/ML IJ SOLN: 10 mg/mL | INTRAMUSCULAR | Qty: 1

## 2019-07-30 MED FILL — ONDANSETRON 4 MG TAB, RAPID DISSOLVE: 4 mg | ORAL | Qty: 1

## 2019-07-30 MED FILL — OXYCODONE 5 MG TAB: 5 mg | ORAL | Qty: 2

## 2019-07-30 MED FILL — CEFAZOLIN 2 GRAM/20 ML IN STERILE WATER INTRAVENOUS SYRINGE: 2 gram/0 mL | INTRAVENOUS | Qty: 20

## 2019-07-30 MED FILL — MIDAZOLAM 1 MG/ML IJ SOLN: 1 mg/mL | INTRAMUSCULAR | Qty: 2

## 2019-07-30 MED FILL — ALCOHOL 62% (NOZIN) NASAL SANITIZER: 62 % | CUTANEOUS | Qty: 3

## 2019-07-30 MED FILL — MARCAINE SPINAL (PF) 0.75 % (7.5 MG/ML) INJECTION SOLUTION: 0.75 % (7.5 mg/mL) | INTRAMUSCULAR | Qty: 2

## 2019-07-30 MED FILL — ONDANSETRON (PF) 4 MG/2 ML INJECTION: 4 mg/2 mL | INTRAMUSCULAR | Qty: 2

## 2019-07-30 MED FILL — XYLOCAINE-MPF 20 MG/ML (2 %) INJECTION SOLUTION: 20 mg/mL (2 %) | INTRAMUSCULAR | Qty: 5

## 2019-07-30 NOTE — Interval H&P Note (Signed)
Pt son Lorin Picket [phone].

## 2019-07-30 NOTE — H&P (Signed)
The patient has end stage arthritis of the left hip joint. The patient was seen and examined and there are no changes to the patient's orthopedic condition. They have tried conservative treatment for this condition; including antiinflammatories and lifestyle modifications and have failed. The necessity for the joint replacement is still present, and the H&P from the office is still current. The patient will be admitted today forProcedure(s) (LRB):  LEFT HIP ARTHROPLASTY TOTAL / DEPUY (Left) .

## 2019-07-30 NOTE — Progress Notes (Signed)
 Problem: Self Care Deficits Care Plan (Adult)  Goal: *Acute Goals and Plan of Care (Insert Text)  Outcome: Progressing Towards Goal  Note: GOALS:   DISCHARGE GOALS (in preparation for going home/rehab):  3 days  1.  Ms. Brandy Vargas will perform one lower body dressing activity with minimal assistance with adaptive equipment to demonstrate improved functional mobility and safety.  2.  Ms. Brandy Vargas will perform one lower body bathing activity with minimal  assistance with adaptive equipment to demonstrate improved functional mobility and safety.  3.  Ms. Brandy Vargas will perform toileting/toilet transfer with contact guard assistance with adaptive equipment to demonstrate improved functional mobility and safety.  4.  Ms. Brandy Vargas will perform shower transfer with contact guard assistance with adaptive equipment to demonstrate improved functional mobility and safety.  5.  Ms. Brandy Vargas will state THA precautions with two verbal cues to demonstrate improved functional mobility and safety.        JOINT CAMP OCCUPATIONAL THERAPY THA: Initial Assessment, Daily Note, and PM 07/30/2019  INPATIENT: Hospital Day: 1  Payor: AARP MEDICARE COMPLETE / Plan: [facility] AARP MEDICARE COMPLETE / Product Type: Managed Care Medicare /      NAME/AGE/GENDER: Brandy Vargas is a 79 y.o. female   PRIMARY DIAGNOSIS:  Arthritis of left hip [M16.12]   Procedure(s) and Anesthesia Type:     * LEFT HIP ARTHROPLASTY TOTAL / DEPUY - Spinal (Left)  ICD-10: Treatment Diagnosis: L THA   Pain in left hip (M25.552)  Stiffness of Left Hip, Not elsewhere classified (F74.347)      ASSESSMENT:     Ms. Brandy Vargas is s/p L THA and presents with decreased weight bearing on L LE and decreased independence with functional mobility and activities of daily living as compared to prior level of function and safety.  Patient would benefit from skilled Occupational Therapy to maximize independence and safety with self-care task and functional mobility.  Pt would also benefit from  education on lower body adaptive equipment and hip precautions post-surgery in preparation for going home. Patient plans for further rehab at home with home health services and good family support from son and family.  OT reviewed therapy schedule and plan of care with patient.  Patient was able to transfer and perform self care skills as charted below.  Patient instructed to call for assistance when needing to get up from the recliner and all needs in reach.  Patient verbalized understanding of call light. Pt performed dressing and mobility as charted below. Should progress well.    This section established at most recent assessment   PROBLEM LIST (Impairments causing functional limitations):  Decreased Strength  Decreased ADL/Functional Activities  Decreased Transfer Abilities  Increased Pain  Increased Fatigue  Decreased Flexibility/Joint Mobility  Decreased Knowledge of Precautions   INTERVENTIONS PLANNED: (Benefits and precautions of occupational therapy have been discussed with the patient.)  Activities of daily living training  Adaptive equipment training  Balance training  Clothing management  Donning&doffing training  Theraputic activity     TREATMENT PLAN: Frequency/Duration: Follow patient 1-2 sessions to address above goals.  Rehabilitation Potential For Stated Goals: Good     RECOMMENDED REHABILITATION/EQUIPMENT: (at time of discharge pending progress): Continue Skilled Therapy.              OCCUPATIONAL PROFILE AND HISTORY:   History of Present Injury/Illness (Reason for Referral):  Pt presents this date s/p (L) THA.    Past Medical History/Comorbidities:   Ms. Brandy Vargas  has a past medical history of  Arthritis, Atrial fibrillation (HCC), Breast cancer (HCC) (2015), COVID-19 vaccine series completed (04/08/2019), GERD (gastroesophageal reflux disease), Hypertension, and Overactive bladder. She also has no past medical history of Malignant hyperthermia due to anesthesia or Pseudocholinesterase deficiency.   Ms. Brandy Vargas  has a past surgical history that includes hx appendectomy; hx tubal ligation (1976); pr breast surgery procedure unlisted (Right, 2015); hx knee arthroscopy; and hx breast lumpectomy (Right).  Social History/Living Environment:   Home Environment: Private residence  # Steps to Enter: 5  One/Two Story Residence: One story  Living Alone: Yes  Support Systems: Child(ren)  Patient Expects to be Discharged to:: [employer]  Current DME Used/Available at Home: None    Prior Level of Function/Work/Activity:  Independent, lives alone, has supportive family     Number of Personal Factors/Comorbidities that affect the Plan of Care: Brief history (0):  LOW COMPLEXITY   ASSESSMENT OF OCCUPATIONAL PERFORMANCE::   Most Recent Physical Functioning:   Balance  Sitting: Intact  Standing: Pull to stand;With support       Gross Assessment  AROM: Within functional limits (R LE)  Strength: Generally decreased, functional (R LE 3+)  Sensation: Intact (R LE)          LLE AROM  L Hip Flexion: 70  L Hip ABduction: 15  LLE Strength  L Hip Flexion: 2  L Hip ABduction: 2  L Knee Extension: 2+  L Ankle Dorsiflexion: 2+  LLE Sensation  Light Touch: No apparent deficit Coordination  Fine Motor Skills-Upper: Left Intact;Right Intact  Gross Motor Skills-Upper: Left Intact;Right Intact         Mental Status  Neurologic State: Alert  Orientation Level: Appropriate for age  Cognition: Appropriate decision making  Perception: Appears intact  Perseveration: No perseveration noted  Safety/Judgement: Awareness of environment                Basic ADLs (From Assessment) Complex ADLs (From Assessment)   Basic ADL  Feeding: Independent  Oral Facial Hygiene/Grooming: Independent  Bathing: Moderate assistance  Type of Bath: Chlorhexidine (CHG)  Upper Body Dressing: Setup  Lower Body Dressing: Moderate assistance  Toileting: Minimum assistance     Grooming/Bathing/Dressing Activities of Daily Living     Cognitive Retraining  Safety/Judgement: Awareness  of environment                 Functional Transfers  Bathroom Mobility: Minimum assistance  Toilet Transfer : Minimum assistance  Shower Transfer: Minimum assistance     Bed/Mat Mobility  Supine to Sit: Minimum assistance  Sit to Stand: Minimum assistance  Stand to Sit: Minimum assistance  Bed to Chair: Minimum assistance  Scooting: Minimum assistance         Physical Skills Involved:  Range of Motion  Balance  Strength Cognitive Skills Affected (resulting in the inability to perform in a timely and safe manner):  [facility] Psychosocial Skills Affected:  Environmental Adaptation   Number of elements that affect the Plan of Care: 3-5:  MODERATE COMPLEXITY   CLINICAL DECISION MAKING:   [employer] AM-PACT "6 Clicks"   Daily Activity Inpatient Short Form  How much help from another person does the patient currently need... Total A Lot A Little None   1.  Putting on and taking off regular lower body clothing?   []  1   [x]  2   []  3   []  4   2.  Bathing (including washing, rinsing, drying)?   []  1   [x]  2   []   3   []  4   3.  Toileting, which includes using toilet, bedpan or urinal?   []  1   [x]  2   []  3   []  4   4.  Putting on and taking off regular upper body clothing?   []  1   []  2   []  3   [x]  4   5.  Taking care of personal grooming such as brushing teeth?   []  1   []  2   []  3   [x]  4   6.  Eating meals?   []  1   []  2   []  3   [x]  4    2007, Trustees of [address], under license to [place] [place]. All rights reserved     Score:  Initial: 18 Most Recent: X (Date: -- )    Interpretation of Tool:  Represents activities that are increasingly more difficult (i.e. Bed mobility, Transfers, Gait).    Medical Necessity:     Skilled intervention continues to be required due to the above deficits.  Reason for Services/Other Comments:  Patient continues to require skilled intervention due to   New THA  .   Use of outcome tool(s) and clinical judgement create a POC that gives a: LOW COMPLEXITY            TREATMENT:   (In  addition to Assessment/Re-Assessment sessions the following treatments were rendered)     Pre-treatment Symptoms/Complaints:    Pain: Initial:   Pain Intensity 1: 0  Post Session:  0     Self Care: (10): Procedure(s) (per grid) utilized to improve and/or restore self-care/home management as related to dressing and functional mobility . Required minimal verbal and tactile cueing to facilitate activities of daily living skills and compensatory activities.    Evaluation complete    Treatment/Session Assessment:     Response to Treatment:  Good, up in chair.    Education:  []  Home Exercises  [x]  Fall Precautions  [x]  Hip Precautions []  Going Home Video  []  Knee/Hip Prosthesis Review  [x]  Walker Management/Safety [x]  Adaptive Equipment as Needed       Interdisciplinary Collaboration:   Physical Therapist  Occupational Therapist  Registered Nurse    After treatment position/precautions:   Up in chair  Bed/Chair-wheels locked  Caregiver at bedside  Call light within reach  RN notified     Compliance with Program/Exercises: Will assess as treatment progresses.    Recommendations/Intent for next treatment session:  Treatment next visit will focus on increasing Ms. Haxton's independence with bed mobility, transfers, self care, functional mobility, modalities for pain, and patient education.      Total Treatment Duration:  OT Patient Time In/Time Out  Time In: 1500  Time Out: [address] [place], [place]

## 2019-07-30 NOTE — Op Note (Signed)
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      [facility]      Surgeon: Claris Gower, MD      Assistant: Dolores Frame, PA    Anesthesia: Spinal  Modifier: 22  ZCH:YIFOY is no height or weight on file to calculate BMI.      EBL: 150 cc     Procedure: Total Hip Arthroplasty     The patient's size and surgical complexity makes implantation and proper insertion of total joint implants more difficult requiring a skilled first assistant Brandy Vargas was brought to the operating room and positioned on the operating table. Anesthesia was administered.  IV antibiotics were administered, along with IV transexamic acid. A time out identifying the patient, procedure, operative side and surgeon was administered and charted by the OR Nurse.      Brandy Vargas was positioned on left lateral decubitus position. The limb was prepped and draped in the usual sterile fashion. The Posterior approach was utilized to expose the hip. The incision was carried through the subcutaneous tissue and underlying fascia with hemostasis obtained using the bovie cauterization. A Charmley retractor was inserted. The short external rotators were divided at their insertion. The sciatic nerve was palpated and identified. Next, a T-shaped capsular incision was accomplished and femoral head was dislocated. The articular surface revealed loss of cartilage, exposed bone and bone spurring. The neck was osteotomized in the appropriate position just above the lesser trochanteric region. The neck cut was measured to be 9 mm.    Acetabular retractors were placed and the appropriate capsulotomy performed. Soft tissue was removed from the acetabulum. The acetabular surface revealed  loss of cartilage with exposed bone. The acetabulum was sequentially reamed. Using trial components, the acetabulum was sized to a 54 mm Pinnacle acetabular cup.The acetabular component was impacted to achieve appropriate horizontal tilt, anteversion, coverage, and stability.    1 screw was  used to stabilize the cup. The polyethelene liner was impacted into position.      Utilizing the femoral retractor, the canal was prepared with appropriate lateralization with the starter rasp. The canal was then broached progressively. The broach was positioned with the appropriate anatomic femoral anteversion. A calcar planar was utilized. A trial reduction with a +5 neck length was utilized. This was found to be the most stable to flexion greater than 90 degrees and on internal and external rotation. Limb lengths were thought to be equilibrated and with appropriate stability as mentioned above. All trial components were removed.     The cementless permanent size 7 high offset ACTIS  was impacted into place. A trial reduction was performed and the above neck length was selected. The permanent Biolox femoral head was impacted into place.     Brandy Vargas's hip was reduced and stability was as mentioned above. Copious irrigation was accomplished about the surgical site. The sciatic nerve was palpated and noted to be intact. The capsule was repaired with a #1 PDS and the external rotators were reattached with a #5 Mersilene.     A lavage of diluted betadine solution of 17.5  ml Betadine in 500 of 0.9% Normal Saline was allowed to soak in the wound for 3 minutes after implanting of the prosthesis. The wound was irrigated with Saline again before closure. The joint and skin areas were sprinkled with 1 gm of vancomycin powder. Prior to the final skin closure, full strength betadine was applied to the skin surrounding the skin incision.     The operative hip was injected prior to closure for post operative pain management. A #1 PDS  suture was used to re-approximate the fascia. 60 cc of transexamic acid was injected under the fascia for hemostasis. Monocryl sutures were used to approximate the subcutaneous layers. Staples were used to reapproximate the skin edges and a sterile bandage was placed. The patient was then rolled to a supine position. The sponge and needle counts were correct. The patient tolerated the procedure without difficulty and left the operating room in satisfactory condition.     Implants:   Implant Name Type Inv. Item Serial No. Manufacturer Lot No. LRB No. Used Action   SCREW BNE L25MM DIA6.5MM CANC HIP Mannie Stabile THRD - [id number]  SCREW BNE L25MM DIA6.5MM CANC HIP S STL GRIPTION FULL THRD  [employer]WD [id number] Left 1 Implanted   CUP ACET DIA54MM 12 H HIP TI Lynnell Jude DOME REV - [id number]  CUP ACET DIA54MM 12 H HIP TI GRIPTION VIP TAPR DOME REV  [employer]WD [id number] Left 1 Implanted   LINER ACET OD54MM ID36MM HIP ALTRX PINN - [id number]  LINER ACET OD54MM ID36MM HIP ALTRX PINN  [employer] DEPU[employer]WD [id number] Left 1 Implanted   STEM FEM SZ 7 HIP HI OFFSET CLLRD CEMENTLESS 12/14 TAPR - [id number]  STEM FEM SZ 7 HIP HI OFFSET CLLRD CEMENTLESS 12/14 TAPR  JN[employer] DEPU[employer]WD [id number] Left 1 Implanted   HEAD FEM DIA36MM +1.5MM OFFSET 12/14 TAPR HIP CERAMIC - [id number]  HEAD FEM DIA36MM +1.5MM OFFSET 12/14 TAPR HIP CERAMIC  [employer] DE[employer]WD [id number] Left 1 Implanted        By: Claris Gower, MD

## 2019-07-30 NOTE — Progress Notes (Signed)
Patient  received resting in bed. Medicated for c/o left hip pain with roxicodone 10mg  po per MD order. Shift assessment completed. Visitor at bedside, will monitor.

## 2019-07-30 NOTE — Anesthesia Procedure Notes (Signed)
Spinal Block    Start time: 07/30/2019 10:29 AM  End time: 07/30/2019 10:34 AM  Performed by: Kandy Garrison, MD  Authorized by: Kandy Garrison, MD     Pre-procedure:  Indications: primary anesthetic  Preanesthetic Checklist: patient identified, risks and benefits discussed, anesthesia consent, patient being monitored and timeout performed    Timeout Time: 10:29 EDT  Preanesthetic Checklist comment:  Risk of nerve damage discussed.    Spinal Block:   Patient Position:  Seated  Prep Region:  Lumbar  Prep: chlorhexidine and patient draped      Location:  L3-4  Technique:  Single shot    Local Dose (mL):  3    Needle:   Needle Type:  Pencan  Needle Gauge:  25 G  Attempts:  1      Events: CSF confirmed, no blood with aspiration and no paresthesia        Assessment:  Insertion:  Uncomplicated  Patient tolerance:  Patient tolerated the procedure well with no immediate complications

## 2019-07-30 NOTE — Interval H&P Note (Signed)
Incontinence brief on

## 2019-07-30 NOTE — Progress Notes (Signed)
 Problem: Mobility Impaired (Adult and Pediatric)  Goal: *Acute Goals and Plan of Care (Insert Text)  Note: GOALS (1-4 days):  (1.)Ms. Amaro will move from supine to sit and sit to supine  in bed with CONTACT GUARD ASSIST.    (2.)Ms. Guier will transfer from bed to chair and chair to bed with CONTACT GUARD ASSIST using the least restrictive device.    (3.)Ms. Spear will ambulate with CONTACT GUARD ASSIST for 250 feet with the least restrictive device.   (4.)Ms. Mcguirt will ambulate up/down 5 steps with bilateral  railing with CONTACT GUARD ASSIST with no device.  (5.)Ms. Depree will state/observe THA precautions with 0 verbal cues.  ________________________________________________________________________________________________      PHYSICAL THERAPY JOINT CAMP THA: Initial Assessment and PM 07/30/2019  INPATIENT: Hospital Day: 1  Payor: AARP MEDICARE COMPLETE / Plan: [facility] AARP MEDICARE COMPLETE / Product Type: Managed Care Medicare /      NAME/AGE/GENDER: CAPUCINE TRYON is a 79 y.o. 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 Yearick presents L THA and is experiencing a decline in her premorbid level of function. She would benefit from further PT while here to address these deficits: decreased L LE strength and ROM, increased pain L hip, decreased standing balance and functional mobility, decreased awareness of THA and dizziness that is elicited with sitting and standing. She said she had a fall on Mon., She plans on going home with her son's assistance and HH PT.  This pm, we review her pT HEP and she performs these in supine. She then moves to sitting and expresses dizziness. She then sits and puts her  clothes on, as we stand she says she is dizzy again, so we only ambulate 5'. CNA to take vital signs    This section established at most recent assessment   PROBLEM LIST (Impairments causing functional limitations):  Decreased Strength  Decreased ADL/Functional Activities  Decreased Transfer Abilities  Decreased Ambulation Ability/Technique  Decreased Balance  Increased Pain  Decreased Activity Tolerance  Increased Fatigue  Decreased Flexibility/Joint Mobility  Decreased Knowledge of Precautions  Decreased Independence with Home Exercise Program  Decreased strength   INTERVENTIONS PLANNED: (Benefits and precautions of physical therapy have been discussed with the patient.)  Bed Mobility  Cold  Gait Training  Home Exercise Program (HEP)  Range of Motion (ROM)  Therapeutic Activites  Therapeutic Exercise/Strengthening  Transfer Training  THA education     TREATMENT PLAN: Frequency/Duration: Follow patient BID for duration of hospital stay to address above goals.   Rehabilitation Potential For Stated Goals: Good     RECOMMENDED REHABILITATION/EQUIPMENT: (at time of discharge pending progress): Continue Skilled Therapy and Home Health: Physical Therapy.              HISTORY:   History of Present Injury/Illness (Reason for Referral):  S/P L THA  Past Medical History/Comorbidities:   Ms. Allaire  has a past medical history of Arthritis, Atrial fibrillation (HCC), Breast cancer (HCC) (2015), COVID-19 vaccine series completed (04/08/2019), GERD (gastroesophageal reflux disease), Hypertension, and Overactive bladder. She also has no past medical history of Malignant hyperthermia due to anesthesia or  Pseudocholinesterase deficiency.  Ms. Wayment  has a past surgical history that includes hx appendectomy; hx tubal ligation (1976); pr breast surgery procedure unlisted (Right, 2015); hx knee arthroscopy; and hx breast lumpectomy (Right).   Social History/Living Environment:   Home Environment: Private residence  # Steps to  Enter: 5  One/Two Story Residence: One story  Living Alone: Yes  Support Systems: Child(ren)  Patient Expects to be Discharged to:: [employer]  Current DME Used/Available at Home: None    Prior Level of Function/Work/Activity:  Functionally I and uses a Geophysical data processor Factors/Comorbidities that affect the Plan of Care: 1-2: MODERATE COMPLEXITY   EXAMINATION:   Most Recent Physical Functioning:      Gross Assessment  AROM: Within functional limits (R LE)  Strength: Generally decreased, functional (R LE 3+)  Sensation: Intact (R LE)          LLE AROM  L Hip Flexion: 70  L Hip ABduction: 15     LLE Strength  L Hip Flexion: 2  L Hip ABduction: 2  L Knee Extension: 2+  L Ankle Dorsiflexion: 2+    Bed Mobility  Supine to Sit: Minimum assistance  Scooting: Minimum assistance    Transfers  Sit to Stand: Minimum assistance  Stand to Sit: Minimum assistance  Stand Pivot Transfers: Minimum assistance  Bed to Chair: Minimum assistance    Balance  Sitting: Intact  Standing: Pull to stand;With support              Weight Bearing Status  Left Side Weight Bearing: As tolerated  Distance (ft): 5 Feet (ft) (dizzy)  Ambulation - Level of Assistance: Minimal assistance  Assistive Device: Walker, rolling  Speed/Cadence: Slow  Step Length: Right shortened  Stance: Left decreased  Gait Abnormalities: Antalgic;Step to gait;Shuffling gait  Interventions: Safety awareness training;Tactile cues;Verbal cues;Visual/Demos     Braces/Orthotics:     Left Hip Cold  Type: Cold/ice pack      Body Structures Involved:  Bones  Joints  Muscles Body Functions Affected:  Movement Related Activities and Participation Affected:  General Tasks and Demands  Mobility  Self Care   Number of elements that affect the Plan of Care: 4+: HIGH COMPLEXITY   CLINICAL PRESENTATION:   Presentation: Stable and uncomplicated: LOW COMPLEXITY   CLINICAL DECISION MAKING:   [employer] AM-PACT "6 Clicks"   Basic Mobility Inpatient Short Form  How much  difficulty does the patient currently have... Unable A Lot A Little None   1.  Turning over in bed (including adjusting bedclothes, sheets and blankets)?   []  1   []  2   [x]  3   []  4   2.  Sitting down on and standing up from a chair with arms ( e.g., wheelchair, bedside commode, etc.)   []  1   []  2   [x]  3   []  4   3.  Moving from lying on back to sitting on the side of the bed?   []  1   []  2   [x]  3   []  4   How much help from another person does the patient currently need... Total A Lot A Little None   4.  Moving to and from a bed to a chair (including a wheelchair)?  []  1   []  2   [x]  3   []  4   5.  Need to walk in hospital room?   []  1   []  2   [x]   3   []  4   6.  Climbing 3-5 steps with a railing?   []  1   [x]  2   []  3   []  4    2007, Trustees of [address], under license to [place] [place]. All rights reserved     Score:  Initial: 17 Most Recent: X (Date: -- )    Interpretation of Tool:  Represents activities that are increasingly more difficult (i.e. Bed mobility, Transfers, Gait).    Medical Necessity:     Patient demonstrates   good   rehab potential due to higher previous functional level.  Reason for Services/Other Comments:  Patient   continues to require present interventions due to patient's inability to perform functional mobility at a safe CGA level  .   Use of outcome tool(s) and clinical judgement create a POC that gives a: Clear prediction of patient's progress: LOW COMPLEXITY            TREATMENT:   (In addition to Assessment/Re-Assessment sessions the following treatments were rendered)     Pre-treatment Symptoms/Complaints:  starting to hurt in L hip  Pain Initial:   Pain Intensity 1: 3  Pain Location 1: Hip  Pain Orientation 1: Left  Pain Intervention(s) 1: Ambulation/Increased Activity, Cold pack, Exercise, Elevation, Repositioned, Emotional support  Post Session:  3, applied ice and elevated LEs. RN aware she would like pain meds     Therapeutic Activity: (  10 Minutes ):  Therapeutic  activities including Bed transfers, Chair transfers, Ambulation on level ground, and instruction and performance of PT HEP, review of THA precautions to improve mobility, strength, balance, and coordination.  Required minimal Safety awareness training;Tactile cues;Verbal cues;Visual/Demos to  to insure safety .         Assessment provided today    Date:  07/30/19 Date:   Date:     ACTIVITY/EXERCISE AM PM AM PM AM PM   GROUP THERAPY  []   []   []   []   []   []    Ankle Pumps  10       Quad Sets  10       Gluteal Sets  10       Hip ABd/ADduction  10AA       Straight Leg Raises         Knee Slides  10AA       Short Arc Quads  10       Long Arc Quads         Chair Slides                  B = bilateral; AA = active assistive; A = active; P = passive      Treatment/Session Assessment:     Response to Treatment:  did well, but does have some dizziness    Education:  [x]  Home Exercises  [x]  Fall Precautions  [x]  Hip Precautions []  D/C Instruction Review  []  Hip Prosthesis Review  [x]  Walker Management/Safety []  Adaptive Equipment as Needed       Interdisciplinary Collaboration:   Physical Therapist  Occupational Therapist  Registered Nurse  Certified Nursing Assistant/Patient Care Technician    After treatment position/precautions:   Up in chair  Bed/Chair-wheels locked  Call light within reach  RN notified  Family at bedside    Compliance with Program/Exercises: Will assess as treatment progresses.    Recommendations/Intent for next treatment session:  Treatment next visit will focus on increasing Ms. Ragas's independence with bed  mobility, transfers, gait training, strength/ROM exercises, modalities for pain, and patient education.      Total Treatment Duration:  PT Patient Time In/Time Out  Time In: 1700  Time Out: 1715    Karna Jama [place], [place]

## 2019-07-30 NOTE — Progress Notes (Signed)
07/30/19 2127   Oxygen Therapy   O2 Sat (%) 95 %   Pulse via Oximetry 72 beats per minute   O2 Device None (Room air)   Pt connected to c/s monitor. No distress noted at this time.

## 2019-07-30 NOTE — Progress Notes (Signed)
 TRANSFER - IN REPORT:    Verbal report received from [facility]) on Brandy Vargas  being received from [employer]) for routine post - op      Report consisted of patient's Situation, Background, Assessment and   Recommendations(SBAR).     Information from the following report(s) SBAR, Procedure Summary and MAR was reviewed with the receiving nurse.    Opportunity for questions and clarification was provided.

## 2019-07-30 NOTE — Progress Notes (Signed)
Care Management Interventions  PCP Verified by CM: Yes  Mode of Transport at Discharge: Self  Transition of Care Consult (CM Consult): Home Health  [facility]: Yes  Discharge Durable Medical Equipment: No  Physical Therapy Consult: Yes  Occupational Therapy Consult: Yes  Current Support Network: Own Home  Confirm Follow Up Transport: Family  The Plan for Transition of Care is Related to the Following Treatment Goals : Return to independent function.  The Patient and/or Patient Representative was Provided with a Choice of Provider and Agrees with the Discharge Plan?: Yes  Freedom of Choice List was Provided with Basic Dialogue that Supports the Patient's Individualized Plan of Care/Goals, Treatment Preferences and Shares the Quality Data Associated with the Providers?: Yes  Discharge Location  Discharge Placement: Home with home health    Patient is a 79 y.o. year old female admitted for Left THA . Patient plans to return home on discharge. Order received to arrange home health. Patient without preference towards agency. Referral sent to [facility]. Patient denies any equipment needs as patient has a walker and cane. Will follow until discharge.

## 2019-07-30 NOTE — Progress Notes (Signed)
07/30/19 1551   Oxygen Therapy   O2 Sat (%) 97 %   Pulse via Oximetry 78 beats per minute   O2 Device None (Room air)   O2 Flow Rate (L/min) 0 l/min   Incentive Spirometry Treatment   Actual Volume (ml) 1800 ml   Patient encouraged to do 10 breaths every hour while awake-patient agreed and demonstrated. No shortness of breath or distress noted. BS are clear b/l.   [facility] notes reviewed- Sat monitor order noted HS.

## 2019-07-30 NOTE — Anesthesia Pre-Procedure Evaluation (Signed)
Relevant Problems   No relevant active problems       Anesthetic History   No history of anesthetic complications            Review of Systems / Medical History  Patient summary reviewed and pertinent labs reviewed    Pulmonary  Within defined limits                 Neuro/Psych   Within defined limits           Cardiovascular    Hypertension        Dysrhythmias : atrial fibrillation  Hyperlipidemia    Exercise tolerance: <4 METS     GI/Hepatic/Renal     GERD           Endo/Other        Arthritis     Other Findings              Physical Exam    Airway  Mallampati: II  TM Distance: 4 - 6 cm  Neck ROM: normal range of motion   Mouth opening: Normal     Cardiovascular    Rhythm: regular  Rate: normal      Pertinent negatives: No murmur   Dental  No notable dental hx       Pulmonary  Breath sounds clear to auscultation               Abdominal         Other Findings            Anesthetic Plan    ASA: 3  Anesthesia type: spinal            Anesthetic plan and risks discussed with: Patient      Mean goal >75

## 2019-07-30 NOTE — Interval H&P Note (Signed)
TRANSFER - OUT REPORT:    Verbal report given to receiving nurse(name) on Brandy Vargas being transferred to 322(unit) for routine progression of care       Report consisted of patient's Situation, Background, Assessment and   Recommendations(SBAR).     Information from the following report(s) OR Summary, Procedure Summary, Intake/Output and MAR was reviewed with the receiving nurse.    Opportunity for questions and clarification was provided.      Patient transported with:   O2 @ 1 liters  Tech

## 2019-07-30 NOTE — Anesthesia Post-Procedure Evaluation (Signed)
Procedure(s):  LEFT HIP ARTHROPLASTY TOTAL / DEPUY.    spinal, total IV anesthesia    Anesthesia Post Evaluation      Multimodal analgesia: multimodal analgesia used between 6 hours prior to anesthesia start to PACU discharge  Patient location during evaluation: bedside  Patient participation: complete - patient participated  Level of consciousness: awake and responsive to light touch  Pain management: adequate  Airway patency: patent  Anesthetic complications: no  Cardiovascular status: acceptable, hemodynamically stable, blood pressure returned to baseline and stable  Respiratory status: acceptable, unassisted, spontaneous ventilation and nonlabored ventilation  Hydration status: acceptable        INITIAL Post-op Vital signs:   Vitals Value Taken Time   BP 107/54 07/30/19 1223   Temp 36.9 ??C (98.4 ??F) 07/30/19 1208   Pulse 58 07/30/19 1223   Resp 16 07/30/19 1223   SpO2 95 % 07/30/19 1223

## 2019-07-31 LAB — HEMOGLOBIN
HGB: 9 g/dL — ABNORMAL LOW (ref 11.7–15.4)
HGB: 9.8 g/dL — ABNORMAL LOW (ref 11.7–15.4)
Hemoglobin: 9 g/dL — ABNORMAL LOW (ref 11.7–15.4)
Hemoglobin: 9.8 g/dL — ABNORMAL LOW (ref 11.7–15.4)

## 2019-07-31 LAB — EKG 12-LEAD
Atrial Rate: 65 {beats}/min
Diagnosis: NORMAL
P Axis: 50 degrees
P-R Interval: 122 ms
Q-T Interval: 430 ms
QRS Duration: 88 ms
QTc Calculation (Bazett): 447 ms
R Axis: 48 degrees
T Axis: 48 degrees
Ventricular Rate: 65 {beats}/min

## 2019-07-31 LAB — EKG, 12 LEAD, INITIAL
Atrial Rate: 65 {beats}/min
Calculated P Axis: 50 degrees
Calculated R Axis: 48 degrees
Calculated T Axis: 48 degrees
Diagnosis: NORMAL
P-R Interval: 122 ms
Q-T Interval: 430 ms
QRS Duration: 88 ms
QTC Calculation (Bezet): 447 ms
Ventricular Rate: 65 {beats}/min

## 2019-07-31 MED ORDER — ASPIRIN 81 MG CHEWABLE TAB
81 mg | ORAL_TABLET | Freq: Every day | ORAL | 0 refills | Status: DC
Start: 2019-07-31 — End: 2019-08-12

## 2019-07-31 MED ORDER — FAMOTIDINE 20 MG TAB
20 mg | Freq: Two times a day (BID) | ORAL | Status: DC
Start: 2019-07-31 — End: 2019-08-01
  Administered 2019-07-31 – 2019-08-01 (×4): via ORAL

## 2019-07-31 MED ORDER — OXYCODONE 5 MG TAB
5 mg | ORAL_TABLET | ORAL | 0 refills | Status: DC | PRN
Start: 2019-07-31 — End: 2019-08-12

## 2019-07-31 MED ORDER — ELIQUIS 5 MG TABLET
5 mg | ORAL_TABLET | Freq: Two times a day (BID) | ORAL | 0 refills | Status: AC
Start: 2019-07-31 — End: ?

## 2019-07-31 MED ORDER — SODIUM CHLORIDE 0.9% BOLUS IV
0.9 % | Freq: Once | INTRAVENOUS | Status: AC
Start: 2019-07-31 — End: 2019-07-31
  Administered 2019-07-31: 12:00:00 via INTRAVENOUS

## 2019-07-31 MED ORDER — MIDAZOLAM 1 MG/ML IJ SOLN
1 mg/mL | INTRAMUSCULAR | Status: DC | PRN
Start: 2019-07-31 — End: 2019-07-31
  Administered 2019-07-30: 15:00:00 via INTRAVENOUS

## 2019-07-31 MED FILL — ALCOHOL 62% (NOZIN) NASAL SANITIZER: 62 % | CUTANEOUS | Qty: 1

## 2019-07-31 MED FILL — TYLENOL EXTRA STRENGTH 500 MG TABLET: 500 mg | ORAL | Qty: 2

## 2019-07-31 MED FILL — METOPROLOL TARTRATE 50 MG TAB: 50 mg | ORAL | Qty: 1

## 2019-07-31 MED FILL — DEXAMETHASONE SODIUM PHOSPHATE 10 MG/ML IJ SOLN: 10 mg/mL | INTRAMUSCULAR | Qty: 1

## 2019-07-31 MED FILL — VALSARTAN 320 MG TAB: 320 mg | ORAL | Qty: 1

## 2019-07-31 MED FILL — ASPIRIN 81 MG TAB, DELAYED RELEASE: 81 mg | ORAL | Qty: 1

## 2019-07-31 MED FILL — CELECOXIB 200 MG CAP: 200 mg | ORAL | Qty: 1

## 2019-07-31 MED FILL — CEFAZOLIN 2 GRAM/20 ML IN STERILE WATER INTRAVENOUS SYRINGE: 2 gram/0 mL | INTRAVENOUS | Qty: 20

## 2019-07-31 MED FILL — FAMOTIDINE 20 MG TAB: 20 mg | ORAL | Qty: 1

## 2019-07-31 MED FILL — TROSPIUM 20 MG TAB: 20 mg | ORAL | Qty: 1

## 2019-07-31 MED FILL — STOOL SOFTENER-STIMULANT LAXATIVE 8.6 MG-50 MG TABLET: ORAL | Qty: 2

## 2019-07-31 NOTE — Progress Notes (Signed)
Problem: Falls - Risk of  Goal: *Absence of Falls  Description: Document Brandy Vargas Fall Risk and appropriate interventions in the flowsheet.  Outcome: Progressing Towards Goal  Note: Fall Risk Interventions:  Mobility Interventions: Patient to call before getting OOB, Utilize walker, cane, or other assistive device, OT consult for ADLs, PT Consult for mobility concerns, Bed/chair exit alarm    Mentation Interventions: Door open when patient unattended, Bed/chair exit alarm, Adequate sleep, hydration, pain control, Eyeglasses and hearing aids, Evaluate medications/consider consulting pharmacy, Increase mobility, More frequent rounding, Reorient patient, Room close to nurse's station, Toileting rounds    Medication Interventions: Patient to call before getting OOB, Evaluate medications/consider consulting pharmacy, Bed/chair exit alarm, Teach patient to arise slowly    Elimination Interventions: Call light in reach, Bed/chair exit alarm, Patient to call for help with toileting needs, Stay With Me (per policy), Toilet paper/wipes in reach, Toileting schedule/hourly rounds    History of Falls Interventions: Consult care management for discharge planning, Bed/chair exit alarm, Evaluate medications/consider consulting pharmacy, Investigate reason for fall, Room close to nurse's station

## 2019-07-31 NOTE — Progress Notes (Signed)
Patient lightheaded on the commode with PT. Patients son states that patient had fallen on Monday and has been having lightheadedness ever since she stopped taking her Eliquis for her heart. Son also stated that ambulance did an EKG and patient was in A-fib. RN informed Dr. Bonnye Fava about situation. Ordered STAT EKG and if patient is in AFIB to get hospitalist involved. Start BOLUS fluids at this time. Continue to hold Eliquis and BP meds per Dr. Bonnye Fava. Verbal order read back and confirmed.

## 2019-07-31 NOTE — Progress Notes (Signed)
Patient states she is not lightheaded at this time. Color has returned back to her face. No distress noted.

## 2019-07-31 NOTE — Progress Notes (Signed)
Occupational Therapy Note:    At time of OT attempt, patient is resting in recliner receiving bolus after lightheaded spell while on commode with PT earlier this morning. RN asked if OT could attempt again after lunch once the bolus is complete. Will check back as patient is available and time allows.    PM Update: Checked with RN and patient. Still very lightheaded with activity. Will check back tomorrow morning for full ADL session as patient is available and time allows.    Thank you,  Ladora Daniel [place], OTR/L

## 2019-07-31 NOTE — Progress Notes (Signed)
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]  LAB:    Recent Results (from the past 24 hour(s))   PROTHROMBIN TIME + INR    Collection Time: 07/30/19  8:40 AM   Result Value Ref Range    Prothrombin time 13.8 12.5 - 14.7 sec    INR 1.0     PTT    Collection Time: 07/30/19  8:40 AM   Result Value Ref Range    aPTT 39.2 (H) 24.1 - 35.1 SEC   HEMOGLOBIN    Collection Time: 07/30/19  8:11 PM   Result Value Ref Range    HGB 9.8 (L) 11.7 - 15.4 g/dL   HEMOGLOBIN    Collection Time: 07/31/19  4:06 AM   Result Value Ref Range    HGB 9.0 (L) 11.7 - 15.4 g/dL     Vital Signs:    Patient Vitals for the past 8 hrs:   BP Temp Pulse Resp SpO2   07/31/19 0743 (!) 102/58 97.5 ??F (36.4 ??C) 68 16 97 %   07/31/19 0351 (!) 97/59 97.8 ??F (36.6 ??C) 68 16 95 %     Temp (24hrs), Avg:97.7 ??F (36.5 ??C), Min:97 ??F (36.1 ??C), Max:98.4 ??F (36.9 ??C)    Pain Control:   Pain Assessment  Pain Scale 1: Numeric (0 - 10)  Pain Intensity 1: 6  Pain Location 1: Hip  Pain Orientation 1: Left  Pain Intervention(s) 1: Medication (see MAR)  Subjective: Doing well, normal recovery experienced.     Objective:  No Acute Distress, Alert and Oriented, Neurovascular exam is normal       Assessment:   Patient Active Problem List   Diagnosis Code   ??? 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 Vitamin D deficiency E55.9   ??? Paroxysmal atrial fibrillation (HCC) I48.0   ??? Essential hypertension I10   ??? 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  Tylenol for pain desired.    She has some light headedness.  Will try a bolus.  Doubt related to her afib history.  Will assess EKG vs. Old.          Signed By: Claris Gower, MD

## 2019-07-31 NOTE — Progress Notes (Signed)
 Problem: Mobility Impaired (Adult and Pediatric)  Goal: *Acute Goals and Plan of Care (Insert Text)  Note: GOALS (1-4 days):  (1.)Brandy Vargas will move from supine to sit and sit to supine  in bed with CONTACT GUARD ASSIST.  MET 07/31/19  (2.)Brandy Vargas will transfer from bed to chair and chair to bed with CONTACT GUARD ASSIST using the least restrictive device.    (3.)Brandy Vargas will ambulate with CONTACT GUARD ASSIST for 250 feet with the least restrictive device.   (4.)Brandy Vargas will ambulate up/down 5 steps with bilateral  railing with CONTACT GUARD ASSIST with no device.  (5.)Brandy Vargas will state/observe THA precautions with 0 verbal cues.  ________________________________________________________________________________________________      PHYSICAL THERAPY JOINT CAMP THA: Daily Note and PM 07/31/2019  INPATIENT: Hospital Day: 2  Payor: AARP MEDICARE COMPLETE / Plan: [facility] AARP MEDICARE COMPLETE / Product Type: Managed Care Medicare /      NAME/AGE/GENDER: Brandy Vargas is a 79 y.o. 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 She would benefit from further PT while here to address these deficits: decreased L LE strength and ROM, increased pain L hip, decreased standing balance and functional mobility, decreased awareness of THA and dizziness that is elicited with sitting and standing. She said she had a fall on Mon., She plans on going home with her son's assistance and HH PT.  This am, we review her PT HEP,and PT DC Instruction sheet. She performs her L THA exs in supine and does well. Upon moving from supine  to sit, she does well. She ambulates to the restroom and once sitting on toilet says she is dizzy and doesn't feel well. As I look at her, she is pale and lips are white. RN, Breck is entering room and comes in restroom to assess her. Pt finishes toileting after I place a cool rag on her forehead, then transfers to recliner with min assist. Able to take care of peri hygiene with supervision. Dr. Rosa stat fluid bolus and EKG. Pt. Left in recliner with son present and RN at bedside. Will check back after lunch for another therapy session.  She does describe a simlar incident earlier this week on Mon. EMT told her she was in a fib.  7/30 sitting in the recliner.  Performs L TH exercises with verbal cues to stay within hip precaution.  Then stood for few then got dizzy and stated can I sit down.  She return to sitting and then reclined back. Therapist placed wet cloth on head and stated I feel better.  Left pt in recliner with needs in reach and ice to L hip.    This section established at most recent assessment   PROBLEM LIST (Impairments causing functional limitations):  1. Decreased Strength  2. Decreased ADL/Functional Activities  3. Decreased Transfer Abilities  4. Decreased Ambulation Ability/Technique  5. Decreased Balance  6. Increased Pain  7. Decreased Activity Tolerance  8. Increased Fatigue  9. Decreased Flexibility/Joint Mobility  10. Decreased Knowledge of  Precautions  11. Decreased Independence with Home Exercise Program  12. Decreased strength   INTERVENTIONS PLANNED: (Benefits and precautions of physical therapy have been discussed with the patient.)  1. Bed Mobility  2. Cold  3. Gait Training  4. Home Exercise Program (HEP)  5. Range of Motion (ROM)  6. Therapeutic Activites  7. Therapeutic Exercise/Strengthening  8. Transfer Training  9. THA education     TREATMENT PLAN: Frequency/Duration: Follow patient BID for duration of hospital stay to address above goals.   Rehabilitation Potential For  Stated Goals: Good     RECOMMENDED REHABILITATION/EQUIPMENT: (at time of discharge pending progress): Continue Skilled Therapy and Home Health: Physical Therapy.              HISTORY:   History of Present Injury/Illness (Reason for Referral):  S/P L THA  Past Medical History/Comorbidities:   Brandy Vargas  has a past medical history of Arthritis, Atrial fibrillation (HCC), Breast cancer (HCC) (2015), COVID-19 vaccine series completed (04/08/2019), GERD (gastroesophageal reflux disease), Hypertension, and Overactive bladder. She also has no past medical history of Malignant hyperthermia due to anesthesia or Pseudocholinesterase deficiency.  Brandy Vargas  has a past surgical history that includes hx appendectomy; hx tubal ligation (1976); pr breast surgery procedure unlisted (Right, 2015); hx knee arthroscopy; and hx breast lumpectomy (Right).   Social History/Living Environment:   Home Environment: Private residence  # Steps to Enter: 5  One/Two Story Residence: One story  Living Alone: Yes  Support Systems: Child(ren)  Patient Expects to be Discharged to:: [employer]  Current DME Used/Available at Home: None    Prior Level of Function/Work/Activity:  Functionally I and uses a rollator   Number of Personal Factors/Comorbidities that affect the Plan of Care: 1-2: MODERATE COMPLEXITY   EXAMINATION:   Most Recent Physical Functioning:                            Bed Mobility  Supine to Sit: Contact guard assistance    Transfers  Sit to Stand: Contact guard assistance  Stand to Sit: Contact guard assistance  Bed to Chair: Contact guard assistance    Balance  Sitting: Intact  Standing: With support              Weight Bearing Status  Left Side Weight Bearing: As tolerated  Distance (ft):  (sit>stand and got dizzy)  Ambulation - Level of Assistance: Contact guard assistance  Assistive Device: Walker, rolling  Speed/Cadence: Pace decreased (<100 feet/min)  Step Length: Right shortened  Stance: Left decreased  Gait Abnormalities:  Antalgic;Decreased step clearance  Interventions: Safety awareness training     Braces/Orthotics:     Left Hip Cold  Type: Cold/ice pack      Body Structures Involved:  1. Bones  2. Joints  3. Muscles Body Functions Affected:  1. Movement Related Activities and Participation Affected:  1. General Tasks and Demands  2. Mobility  3. Self Care   Number of elements that affect the Plan of Care: 4+: HIGH COMPLEXITY   CLINICAL PRESENTATION:   Presentation: Stable and uncomplicated: LOW COMPLEXITY   CLINICAL DECISION MAKING:   [employer] AM-PACT "6 Clicks"   Basic Mobility Inpatient Short Form  How much difficulty does the patient currently have... Unable A Lot A Little None   1.  Turning over in bed (including adjusting bedclothes, sheets and blankets)?   []  1   []  2   [x]  3   []   4   2.  Sitting down on and standing up from a chair with arms ( e.g., wheelchair, bedside commode, etc.)   []  1   []  2   [x]  3   []  4   3.  Moving from lying on back to sitting on the side of the bed?   []  1   []  2   [x]  3   []  4   How much help from another person does the patient currently need... Total A Lot A Little None   4.  Moving to and from a bed to a chair (including a wheelchair)?  []  1   []  2   [x]  3   []  4   5.  Need to walk in hospital room?   []  1   []  2   [x]  3   []  4   6.  Climbing 3-5 steps with a railing?   []  1   [x]  2   []  3   []  4    2007, Trustees of [address], under license to [place], [place]. All rights reserved     Score:  Initial: 17 Most Recent: X (Date: -- )    Interpretation of Tool:  Represents activities that are increasingly more difficult (i.e. Bed mobility, Transfers, Gait).    Medical Necessity:      Patient demonstrates    good    rehab potential due to higher previous functional level.  Reason for Services/Other Comments:   Patient    continues to require present interventions due to patient's inability to perform functional mobility at a safe CGA level   .   Use of outcome tool(s) and  clinical judgement create a POC that gives a: Clear prediction of patient's progress: LOW COMPLEXITY            TREATMENT:   (In addition to Assessment/Re-Assessment sessions the following treatments were rendered)     Pre-treatment Symptoms/Complaints:   Pain Initial:   Pain Intensity 1: 2 (more dizzy then pain)  Post Session:     Therapeutic Activity: (  30 Minutes ):  Therapeutic activities including Bed transfers, Chair transfers, Ambulation on level ground, and instruction and performance of PT HEP, PT DC Instruction sheet,, review of THA precautions to improve mobility, strength, balance, and coordination.  Required minimal Safety awareness training to  to insure safety .             Date:  07/30/19 Date:  07/31/19 Date:     ACTIVITY/EXERCISE AM PM AM PM AM PM   GROUP THERAPY  []   []   []   []   []   []    Ankle Pumps  10 20 20      Quad Sets  10 20 20      Gluteal Sets  10 20 20      Hip ABd/ADduction  10AA 20AA 20 aa     Straight Leg Raises         Knee Slides  10AA 20AA 20 aa     Short Arc Quads  10 20 20      Long Arc Quads   20 20     Chair Slides                  B = bilateral; AA = active assistive; A = active; P = passive      Treatment/Session Assessment:     Response to Treatment:  Some light headed this pm, but not able to walk.  Education:  [x]  Home Exercises  [x]  Fall Precautions  [x]  Hip Precautions [x]  D/C Instruction Review  []  Hip Prosthesis Review  [x]  Walker Management/Safety []  Adaptive Equipment as Needed       Interdisciplinary Collaboration:   o Registered Nurse    After treatment position/precautions:   o Up in chair  o Bed/Chair-wheels locked  o Call light within reach  o Family at bedside  o Nurse at bedside    Compliance with Program/Exercises: Will assess as treatment progresses.    Recommendations/Intent for next treatment session:  Treatment next visit will focus on increasing Ms. Person's independence with bed mobility, transfers, gait training, strength/ROM exercises, modalities for  pain, and patient education.      Total Treatment Duration:  PT Patient Time In/Time Out  Time In: 1300  Time Out: 1330    Lindy C Zeager, PTA

## 2019-07-31 NOTE — Progress Notes (Signed)
No complaints related to their anesthesia care

## 2019-07-31 NOTE — Progress Notes (Signed)
 Problem: Mobility Impaired (Adult and Pediatric)  Goal: *Acute Goals and Plan of Care (Insert Text)  Note: GOALS (1-4 days):  (1.)Ms. Brandy Vargas will move from supine to sit and sit to supine  in bed with CONTACT GUARD ASSIST.  MET 07/31/19  (2.)Ms. Brandy Vargas will transfer from bed to chair and chair to bed with CONTACT GUARD ASSIST using the least restrictive device.    (3.)Ms. Brandy Vargas will ambulate with CONTACT GUARD ASSIST for 250 feet with the least restrictive device.   (4.)Ms. Brandy Vargas will ambulate up/down 5 steps with bilateral  railing with CONTACT GUARD ASSIST with no device.  (5.)Ms. Brandy Vargas will state/observe THA precautions with 0 verbal cues.  ________________________________________________________________________________________________      PHYSICAL THERAPY JOINT CAMP THA: Daily Note and AM 07/31/2019  INPATIENT: Hospital Day: 2  Payor: AARP MEDICARE COMPLETE / Plan: [facility] AARP MEDICARE COMPLETE / Product Type: Managed Care Medicare /      NAME/AGE/GENDER: Brandy Vargas is a 79 y.o. 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 Brandy Vargas presents L THA and is experiencing a decline in her premorbid level of function. She would benefit from further PT while here to address these deficits: decreased L LE strength and ROM, increased pain L hip, decreased standing balance and functional mobility, decreased awareness of THA and dizziness that is elicited with sitting and standing. She said she had a fall on Mon., She plans on going home with her son's assistance and HH PT.  This am, we review her PT HEP,and PT DC Instruction sheet. She performs her L THA exs in supine and does well. Upon moving from supine  to sit, she does well. She ambulates to the restroom and once sitting on toilet says she is dizzy and doesn't feel well. As I look at her, she is pale and lips are white. RN, Breck is entering room and comes in restroom to assess her. Pt finishes toileting after I place a cool rag on her forehead, then transfers to recliner with min assist. Able to take care of peri hygiene with supervision. Dr. Rosa stat fluid bolus and EKG. Pt. Left in recliner with son present and RN at bedside. Will check back after lunch for another therapy session.  She does describe a simlar incident earlier this week on Mon. EMT told her she was in a fib.    This section established at most recent assessment   PROBLEM LIST (Impairments causing functional limitations):  1. Decreased Strength  2. Decreased ADL/Functional Activities  3. Decreased Transfer Abilities  4. Decreased Ambulation Ability/Technique  5. Decreased Balance  6. Increased Pain  7. Decreased Activity Tolerance  8. Increased Fatigue  9. Decreased Flexibility/Joint Mobility  10. Decreased Knowledge of Precautions  11. Decreased Independence with Home Exercise Program  12. Decreased strength   INTERVENTIONS PLANNED: (Benefits and precautions of physical therapy have been discussed with the patient.)  1. Bed Mobility  2. Cold  3. Gait Training  4. Home Exercise Program (HEP)  5. Range of Motion (ROM)  6. Therapeutic Activites  7. Therapeutic Exercise/Strengthening  8. Transfer Training  9. THA  education     TREATMENT PLAN: Frequency/Duration: Follow patient BID for duration of hospital stay to address above goals.   Rehabilitation Potential For Stated Goals: Good     RECOMMENDED REHABILITATION/EQUIPMENT: (at time of discharge pending progress): Continue Skilled Therapy and Home Health: Physical Therapy.              HISTORY:   History of Present Injury/Illness (Reason for Referral):  S/P L THA  Past Medical History/Comorbidities:   Ms. Brandy Vargas  has a past medical history of  Arthritis, Atrial fibrillation (HCC), Breast cancer (HCC) (2015), COVID-19 vaccine series completed (04/08/2019), GERD (gastroesophageal reflux disease), Hypertension, and Overactive bladder. She also has no past medical history of Malignant hyperthermia due to anesthesia or Pseudocholinesterase deficiency.  Ms. Brandy Vargas  has a past surgical history that includes hx appendectomy; hx tubal ligation (1976); pr breast surgery procedure unlisted (Right, 2015); hx knee arthroscopy; and hx breast lumpectomy (Right).   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 Factors/Comorbidities that affect the Plan of Care: 1-2: MODERATE COMPLEXITY   EXAMINATION:   Most Recent Physical Functioning:                            Bed Mobility  Supine to Sit: Contact guard assistance    Transfers  Sit to Stand: Contact guard assistance  Stand to Sit: Contact guard assistance  Bed to Chair: Contact guard assistance    Balance  Sitting: Intact  Standing: With support              Weight Bearing Status  Left Side Weight Bearing: As tolerated  Distance (ft): 15 Feet (ft)  Ambulation - Level of Assistance:  (close CGA)  Assistive Device: Walker, rolling  Speed/Cadence: Pace decreased (<100 feet/min)  Step Length: Right shortened  Stance: Left decreased  Gait Abnormalities: Antalgic;Decreased step clearance  Interventions: Safety awareness training;Verbal cues     Braces/Orthotics:     Left Hip Cold  Type: Cold/ice pack      Body Structures Involved:  1. Bones  2. Joints  3. Muscles Body Functions Affected:  1. Movement Related Activities and Participation Affected:  1. General Tasks and Demands  2. Mobility  3. Self Care   Number  of elements that affect the Plan of Care: 4+: HIGH COMPLEXITY   CLINICAL PRESENTATION:   Presentation: Stable and uncomplicated: LOW COMPLEXITY   CLINICAL DECISION MAKING:   [employer] AM-PACT "6 Clicks"   Basic Mobility Inpatient Short Form  How much difficulty does the patient currently have... Unable A Lot A Little None   1.  Turning over in bed (including adjusting bedclothes, sheets and blankets)?   []  1   []  2   [x]  3   []  4   2.  Sitting down on and standing up from a chair with arms ( e.g., wheelchair, bedside commode, etc.)   []  1   []  2   [x]  3   []  4   3.  Moving from lying on back to sitting on the side of the bed?   []  1   []  2   [x]  3   []   4   How much help from another person does the patient currently need... Total A Lot A Little None   4.  Moving to and from a bed to a chair (including a wheelchair)?  []  1   []  2   [x]  3   []  4   5.  Need to walk in hospital room?   []  1   []  2   [x]  3   []  4   6.  Climbing 3-5 steps with a railing?   []  1   [x]  2   []  3   []  4    2007, Trustees of [address], under license to [place], [place]. All rights reserved     Score:  Initial: 17 Most Recent: X (Date: -- )    Interpretation of Tool:  Represents activities that are increasingly more difficult (i.e. Bed mobility, Transfers, Gait).    Medical Necessity:      Patient demonstrates    good    rehab potential due to higher previous functional level.  Reason for Services/Other Comments:   Patient    continues to require present interventions due to patient's inability to perform functional mobility at a safe CGA level   .   Use of outcome tool(s) and clinical judgement create a POC that gives a: Clear prediction of patient's progress: LOW COMPLEXITY            TREATMENT:   (In addition to Assessment/Re-Assessment sessions the following treatments were rendered)     Pre-treatment Symptoms/Complaints:  Feels dizzy while ambulating  Pain Initial:   Pain Intensity 1: 3  Pain Location 1: Hip  Pain  Orientation 1: Left  Pain Intervention(s) 1: Ambulation/Increased Activity, Cold pack, Elevation, Exercise, Repositioned  Post Session: 2, applied ice and elevated LEs. RN  At the bedside     Therapeutic Activity: (  25 Minutes ):  Therapeutic activities including Bed transfers, Chair transfers, Ambulation on level ground, and instruction and performance of PT HEP, PT DC Instruction sheet,, review of THA precautions to improve mobility, strength, balance, and coordination.  Required minimal Safety awareness training;Verbal cues to  to insure safety .             Date:  07/30/19 Date:  07/31/19 Date:     ACTIVITY/EXERCISE AM PM AM PM AM PM   GROUP THERAPY  []   []   []   []   []   []    Ankle Pumps  10 20      Quad Sets  10 20      Gluteal Sets  10 20      Hip ABd/ADduction  10AA 20AA      Straight Leg Raises         Knee Slides  10AA 20AA      Short Arc Quads  10 20      Long Arc Quads   20      Chair Slides                  B = bilateral; AA = active assistive; A = active; P = passive      Treatment/Session Assessment:     Response to Treatment:  Dizziness limited treatment this am    Education:  [x]  Home Exercises  [x]  Fall Precautions  [x]  Hip Precautions [x]  D/C Instruction Review  []  Hip Prosthesis Review  [x]  Walker Management/Safety []  Adaptive Equipment as Needed       Interdisciplinary Collaboration:   o Physical  Therapist  o Occupational Therapist  o Registered Nurse  o Physician    After treatment position/precautions:   o Up in chair  o Bed/Chair-wheels locked  o Call light within reach  o Family at bedside  o Nurse at bedside    Compliance with Program/Exercises: Will assess as treatment progresses.    Recommendations/Intent for next treatment session:  Treatment next visit will focus on increasing Ms. Weinfeld's independence with bed mobility, transfers, gait training, strength/ROM exercises, modalities for pain, and patient education.      Total Treatment Duration:  PT Patient Time In/Time Out  Time In:  0755  Time Out: 0820    Karna Jama Mercury, PT

## 2019-07-31 NOTE — Addendum Note (Signed)
Addendum         created 07/31/19 1119 by Annalicia Renfrew, Fanny Skates, CRNA                   Orders acknowledged in Narrator

## 2019-07-31 NOTE — Progress Notes (Signed)
Patient in recliner. Dressing dry and intact. Patient denies needs at present. Neurovascular status WDL. Palpable pulses. Positive dorsiflexion and plantar flexion. Instructed not to get up by themselves and call for assistance or any needs. Patient verbalized understanding. Call bell within reach. Side rails up x3. Bed low and locked. Recliner wheels locked. No distress noted. Family member at bedside.

## 2019-07-31 NOTE — Progress Notes (Signed)
Chaplain met with patient and son Nicki Reaper at bedside, offered support, empathetic presence, prayer. Patient expressed she is hopeful she will be able to go home tomorrow.    Andrez Grime  Chaplain PRN

## 2019-08-01 ENCOUNTER — Encounter

## 2019-08-01 LAB — HEMOGLOBIN
HGB: 7.5 g/dL — ABNORMAL LOW (ref 11.7–15.4)
Hemoglobin: 7.5 g/dL — ABNORMAL LOW (ref 11.7–15.4)

## 2019-08-01 MED ORDER — ONDANSETRON HCL 8 MG TAB
8 mg | ORAL_TABLET | Freq: Three times a day (TID) | ORAL | 0 refills | Status: DC | PRN
Start: 2019-08-01 — End: 2019-09-25

## 2019-08-01 MED FILL — CELECOXIB 200 MG CAP: 200 mg | ORAL | Qty: 1

## 2019-08-01 MED FILL — ALCOHOL 62% (NOZIN) NASAL SANITIZER: 62 % | CUTANEOUS | Qty: 1

## 2019-08-01 MED FILL — ASPIRIN 81 MG TAB, DELAYED RELEASE: 81 mg | ORAL | Qty: 1

## 2019-08-01 MED FILL — TYLENOL EXTRA STRENGTH 500 MG TABLET: 500 mg | ORAL | Qty: 2

## 2019-08-01 MED FILL — METOPROLOL TARTRATE 50 MG TAB: 50 mg | ORAL | Qty: 1

## 2019-08-01 MED FILL — FAMOTIDINE 20 MG TAB: 20 mg | ORAL | Qty: 1

## 2019-08-01 MED FILL — OXYCODONE 5 MG TAB: 5 mg | ORAL | Qty: 2

## 2019-08-01 MED FILL — STOOL SOFTENER-STIMULANT LAXATIVE 8.6 MG-50 MG TABLET: ORAL | Qty: 2

## 2019-08-01 MED FILL — OXYCODONE 5 MG TAB: 5 mg | ORAL | Qty: 1

## 2019-08-01 MED FILL — VALSARTAN 320 MG TAB: 320 mg | ORAL | Qty: 1

## 2019-08-01 MED FILL — TROSPIUM 20 MG TAB: 20 mg | ORAL | Qty: 1

## 2019-08-01 NOTE — Progress Notes (Signed)
Held BB this AM to monitor her orthostatics  Explained to patient/ family rationale  Asymptomatic  BP 117/55

## 2019-08-01 NOTE — Progress Notes (Signed)
ORTH FRACTURE PROGRESS NOTE    August 01, 2019  Admit Date:   07/30/2019    Post Op day: 2 Days Post-Op    Subjective:    Brandy Vargas is doing well today. She is up in bedside chair eating breakfast. She is ready to go home today, her son is at bedside.  Reports having minimal pain at this time.     PT/OT:   Gait:  Gait  Speed/Cadence: Pace decreased (<100 feet/min)  Step Length: Right shortened  Stance: Left decreased  Gait Abnormalities: Antalgic, Decreased step clearance  Ambulation - Level of Assistance: Contact guard assistance  Distance (ft):  (sit>stand and got dizzy)  Assistive Device: Walker, rolling  Interventions: Safety awareness training            Interventions: Safety awareness training    Vital Signs:    Patient Vitals for the past 8 hrs:   BP Temp Pulse Resp SpO2   08/01/19 0808 (!) 117/55 97.6 ??F (36.4 ??C) 79 16 100 %   08/01/19 0345 (!) 110/58 97.9 ??F (36.6 ??C) 78 18 100 %     Temp (24hrs), Avg:97.8 ??F (36.6 ??C), Min:97.4 ??F (36.3 ??C), Max:98.4 ??F (36.9 ??C)      Pain Control:   Pain Assessment  Pain Scale 1: Numeric (0 - 10)  Pain Intensity 1: 0  Pain Location 1: Hip  Pain Orientation 1: Left  Pain Description 1: Aching, Sharp  Pain Intervention(s) 1: Medication (see MAR), Ice    Meds:    Current Facility-Administered Medications   Medication Dose Route Frequency   ??? famotidine (PEPCID) tablet 20 mg  20 mg Oral BID   ??? metoprolol tartrate (LOPRESSOR) tablet 50 mg  50 mg Oral BID   ??? trospium (SANCTURA) tablet 20 mg  20 mg Oral DAILY   ??? valsartan (DIOVAN) tablet 320 mg  320 mg Oral QHS   ??? alcohol 62% (NOZIN) nasal sanitizer 1 Ampule  1 Ampule Topical Q12H   ??? sodium chloride (NS) flush 5-40 mL  5-40 mL IntraVENous Q8H   ??? sodium chloride (NS) flush 5-40 mL  5-40 mL IntraVENous PRN   ??? acetaminophen (TYLENOL) tablet 1,000 mg  1,000 mg Oral Q6H   ??? celecoxib (CELEBREX) capsule 200 mg  200 mg Oral Q12H   ??? oxyCODONE IR (ROXICODONE) tablet 10 mg  10 mg Oral Q4H PRN   ??? naloxone (NARCAN) injection  0.2-0.4 mg  0.2-0.4 mg IntraVENous Q10MIN PRN   ??? ondansetron (ZOFRAN ODT) tablet 4 mg  4 mg Oral Q4H PRN   ??? diphenhydrAMINE (BENADRYL) capsule 25 mg  25 mg Oral Q4H PRN   ??? senna-docusate (PERICOLACE) 8.6-50 mg per tablet 2 Tablet  2 Tablet Oral DAILY   ??? HYDROmorphone (DILAUDID) injection 1 mg  1 mg IntraVENous Q3H PRN   ??? aspirin delayed-release tablet 81 mg  81 mg Oral Q12H       LAB:    Recent Labs     08/01/19  0423 07/30/19  2011 07/30/19  0840   HGB 7.5*   < >  --    INR  --   --  1.0    < > = values in this interval not displayed.       24 Hour Assessment Issues:    Oriented    Discharge Planning: HOME    Assessment & Physician's Comment:  Neurovascular checks within normal limits, Aquacel dressing is intact and dry.     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   Resume pre hospital diet.   Ambulate using recommended assistive devices    Gerri Lins, NP

## 2019-08-01 NOTE — Progress Notes (Signed)
 Problem: Mobility Impaired (Adult and Pediatric)  Goal: *Acute Goals and Plan of Care (Insert Text)  Note: GOALS (1-4 days):  (1.)Brandy Vargas will move from supine to sit and sit to supine  in bed with CONTACT GUARD ASSIST.  MET 07/31/19  (2.)Brandy Vargas will transfer from bed to chair and chair to bed with CONTACT GUARD ASSIST using the least restrictive device.    (3.)Brandy Vargas will ambulate with CONTACT GUARD ASSIST for 250 feet with the least restrictive device.   (4.)Brandy Vargas will ambulate up/down 5 steps with bilateral  railing with CONTACT GUARD ASSIST with no device.  Met 08/01/19  (5.)Brandy Vargas will state/observe THA precautions with 0 verbal cues.  ________________________________________________________________________________________________      PHYSICAL THERAPY JOINT CAMP THA: Daily Note and AM 08/01/2019  INPATIENT: Hospital Day: 3  Payor: AARP MEDICARE COMPLETE / Plan: [facility] AARP MEDICARE COMPLETE / Product Type: Managed Care Medicare /      NAME/AGE/GENDER: Brandy Vargas is a 79 y.o. 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 She would benefit from further PT while here to address these deficits: decreased L LE strength and ROM, increased pain L hip, decreased standing balance and functional mobility, decreased awareness of THA and dizziness that is elicited with sitting and standing. She said she had a fall on Mon., She plans on going home with her son's assistance and HH PT.    Patient feeling better this am.  Son reports she walked a lot last night.  Patient complained of pain with most  exercises but able to do them fairly well.  Ambulated better with no complaints of dizziness but doesn't have great tolerance for distance with complaints of pain.  Able to negotiate steps without much difficulty.  Reminded patient of importance of HEP and frequent ambulation at home.    This section established at most recent assessment   PROBLEM LIST (Impairments causing functional limitations):  1. Decreased Strength  2. Decreased ADL/Functional Activities  3. Decreased Transfer Abilities  4. Decreased Ambulation Ability/Technique  5. Decreased Balance  6. Increased Pain  7. Decreased Activity Tolerance  8. Increased Fatigue  9. Decreased Flexibility/Joint Mobility  10. Decreased Knowledge of Precautions  11. Decreased Independence with Home Exercise Program  12. Decreased strength   INTERVENTIONS PLANNED: (Benefits and precautions of physical therapy have been discussed with the patient.)  1. Bed Mobility  2. Cold  3. Gait Training  4. Home Exercise Program (HEP)  5. Range of Motion (ROM)  6. Therapeutic Activites  7. Therapeutic Exercise/Strengthening  8. Transfer Training  9. THA education     TREATMENT PLAN: Frequency/Duration: Follow patient BID for duration of hospital stay to address above goals.   Rehabilitation Potential For Stated Goals: Good     RECOMMENDED REHABILITATION/EQUIPMENT: (at time of discharge pending progress): Continue Skilled Therapy and Home Health: Physical Therapy.              HISTORY:   History of Present Injury/Illness (Reason for Referral):  S/P L THA  Past Medical History/Comorbidities:   Brandy Vargas  has a past medical history of Arthritis, Atrial fibrillation (HCC), Breast cancer (HCC) (2015), COVID-19 vaccine series completed (04/08/2019), GERD (gastroesophageal reflux disease), Hypertension, and Overactive bladder. She also has no past medical history of Malignant hyperthermia due to anesthesia or Pseudocholinesterase deficiency.  Brandy Vargas  has a past surgical history that  includes hx appendectomy; hx tubal ligation (1976); pr breast surgery procedure unlisted (Right, 2015); hx knee arthroscopy; and hx breast lumpectomy (Right).   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 Factors/Comorbidities that affect the Plan of Care: 1-2: MODERATE COMPLEXITY   EXAMINATION:   Most Recent Physical Functioning:      Gross Assessment  AROM: Generally decreased, functional  Strength: Generally decreased, functional                LLE Strength  L Hip Flexion: 2  L Hip ABduction: 2  L Knee Extension: 3-  L Ankle Dorsiflexion: 3         Transfers  Sit to Stand: Stand-by assistance  Stand to Sit: Stand-by assistance  Bed to Chair: Stand-by assistance    Balance  Sitting: Intact  Standing: With support              Weight Bearing Status  Left Side Weight Bearing: As tolerated  Distance (ft): 90 Feet (ft)  Ambulation - Level of Assistance: Stand-by assistance  Assistive Device: Walker, rolling  Speed/Cadence: Pace decreased (<100 feet/min)  Step Length: Left shortened;Right shortened  Stance: Left decreased  Gait Abnormalities: Antalgic  Number of Stairs Trained: 5  Stairs - Level of Assistance: Stand-by assistance;Contact guard assistance  Rail Use: Both  Interventions: Safety awareness training;Verbal cues     Braces/Orthotics:            Body Structures Involved:  1. Bones  2. Joints  3. Muscles Body Functions Affected:  1. Movement Related Activities and Participation Affected:  1. General Tasks and Demands  2. Mobility  3. Self Care   Number of elements that affect the Plan of Care: 4+: HIGH COMPLEXITY   CLINICAL PRESENTATION:   Presentation: Stable and uncomplicated: LOW COMPLEXITY    CLINICAL DECISION MAKING:   [employer] AM-PACT "6 Clicks"   Basic Mobility Inpatient Short Form  How much difficulty does the patient currently have... Unable A Lot A Little None   1.  Turning over in bed (including adjusting bedclothes, sheets and blankets)?   []  1   []  2   [x]  3   []  4   2.  Sitting down on and standing up from a chair with arms ( e.g., wheelchair, bedside commode, etc.)   []  1   []  2   [x]  3   []  4   3.  Moving from lying on back to sitting on the side of the bed?   []  1   []  2   [x]  3   []  4   How much help from another person does the patient currently need... Total A Lot A Little None   4.  Moving to and from a bed to a chair (including a wheelchair)?  []  1   []   2   [x]  3   []  4   5.  Need to walk in hospital room?   []  1   []  2   [x]  3   []  4   6.  Climbing 3-5 steps with a railing?   []  1   [x]  2   []  3   []  4    2007, Trustees of [address], under license to [place], [place]. All rights reserved     Score:  Initial: 17 Most Recent: X (Date: -- )    Interpretation of Tool:  Represents activities that are increasingly more difficult (i.e. Bed mobility, Transfers, Gait).    Medical Necessity:      Patient demonstrates    good    rehab potential due to higher previous functional level.  Reason for Services/Other Comments:   Patient    continues to require present interventions due to patient's inability to perform functional mobility at a safe CGA level   .   Use of outcome tool(s) and clinical judgement create a POC that gives a: Clear prediction of patient's progress: LOW COMPLEXITY            TREATMENT:   (In addition to Assessment/Re-Assessment sessions the following treatments were rendered)     Pre-treatment Symptoms/Complaints: patient agreeable.  Pain Initial:   Pain Intensity 1: 4  Post Session:  4/10     Therapeutic Activity: (    ):  Therapeutic activities including Chair transfers, Ambulation on level ground, stairs, instruction and performance of PT HEP, and  review of THA precautions to improve mobility, strength, balance, and coordination.  Required minimal Safety awareness training;Verbal cues to  to insure safety .             Date:  07/30/19 Date:  07/31/19 Date:  08/01/19   ACTIVITY/EXERCISE AM PM AM PM AM PM   GROUP THERAPY  []   []   []   []   []   []    Ankle Pumps  10 20 20 20     Quad Sets  10 20 20 20     Gluteal Sets  10 20 20 20     Hip ABd/ADduction  10AA 20AA 20 aa 20 AA    Straight Leg Raises         Knee Slides  10AA 20AA 20 aa 20 AA    Short Arc Quads  10 20 20 20     Long Arc Quads   20 20 20     Chair Slides                  B = bilateral; AA = active assistive; A = active; P = passive      Treatment/Session Assessment:     Response to Treatment:  Ambulated better and able to negotiate steps with minimal difficulty.    Education:  [x]  Home Exercises  [x]  Fall Precautions  [x]  Hip Precautions [x]  D/C Instruction Review  []  Hip Prosthesis Review  [x]  Walker Management/Safety []  Adaptive Equipment as Needed       Interdisciplinary Collaboration:   o Physical Therapist  o Registered Nurse    After treatment position/precautions:   o Up in chair  o Bed/Chair-wheels locked  o Call light within reach  o Family at bedside    Compliance with Program/Exercises: Will assess as treatment progresses.    Recommendations/Intent for next treatment session:  Treatment next visit will focus on increasing Ms. Dossantos's independence with bed mobility, transfers, gait training, strength/ROM exercises, modalities  for pain, and patient education.      Total Treatment Duration:  PT Patient Time In/Time Out  Time In: 1045  Time Out: 1110    Dwayne JINNY Fuelling, PT

## 2019-08-01 NOTE — Progress Notes (Signed)
Problem: Falls - Risk of  Goal: *Absence of Falls  Description: Document Brandy Vargas Fall Risk and appropriate interventions in the flowsheet.  Outcome: Progressing Towards Goal  Note: Fall Risk Interventions:  Mobility Interventions: Patient to call before getting OOB, OT consult for ADLs, Utilize walker, cane, or other assistive device, PT Consult for mobility concerns    Mentation Interventions: Adequate sleep, hydration, pain control, Increase mobility, More frequent rounding, Toileting rounds, Room close to nurse's station, Eyeglasses and hearing aids, Evaluate medications/consider consulting pharmacy    Medication Interventions: Evaluate medications/consider consulting pharmacy, Patient to call before getting OOB, Teach patient to arise slowly, Bed/chair exit alarm    Elimination Interventions: Call light in reach, Bed/chair exit alarm, Patient to call for help with toileting needs, Stay With Me (per policy), Toilet paper/wipes in reach, Toileting schedule/hourly rounds    History of Falls Interventions: Door open when patient unattended, Consult care management for discharge planning, Bed/chair exit alarm, Evaluate medications/consider consulting pharmacy, Investigate reason for fall, Room close to nurse's station

## 2019-08-01 NOTE — Discharge Summary (Signed)
Total Joint Discharge Summary      Patient ID:  Brandy Vargas  [id number]  79 y.o.  18-Dec-1940    Allergies: Omeprazole magnesium    Admit date: 07/30/2019    Discharge date and time: No discharge date for patient encounter.     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     DVT Prophylaxis:  Pt is on Eliquis 5mg , will resume per Surgeon order.       Post Op complications: none    Hemoglobin at discharge: __7.5_    * Discharge Condition: good  Wound appears to be healing without any evidence of infection.     Physical Therapy started on the day following surgery and progressed to independent ambulation with the aid of a walker.  At the time of discharge, able to go up and down stairs and had understanding of precautions needed following surgery.        * Discharged to: Home    * Follow-up Care/Discharge instructions:  - Anticoagulate with: Eliquis 5mg    - Resume pre hospital diet            - Resume home medications per medical continuation form     - Ambulate with walker, appropriate total joint protocol  - Follow up in office as scheduled       Signed:  , NP  08/01/2019  9:11 AM

## 2019-08-01 NOTE — Progress Notes (Signed)
Care Management Interventions  PCP Verified by CM: Yes  Mode of Transport at Discharge: Self  Transition of Care Consult (CM Consult): Home Health  [facility]: Yes  Discharge Durable Medical Equipment: No  Physical Therapy Consult: Yes  Occupational Therapy Consult: Yes  Current Support Network: Own Home  Confirm Follow Up Transport: Family  The Plan for Transition of Care is Related to the Following Treatment Goals : Return to independent function.  The Patient and/or Patient Representative was Provided with a Choice of Provider and Agrees with the Discharge Plan?: Yes  Freedom of Choice List was Provided with Basic Dialogue that Supports the Patient's Individualized Plan of Care/Goals, Treatment Preferences and Shares the Quality Data Associated with the Providers?: Yes  Discharge Location  Discharge Placement: Home with home health

## 2019-08-01 NOTE — Progress Notes (Signed)
 Problem: Self Care Deficits Care Plan (Adult)  Goal: *Acute Goals and Plan of Care (Insert Text)  Outcome: Progressing Towards Goal  Note: GOALS:   DISCHARGE GOALS (in preparation for going home/rehab):  3 days  1.  Brandy Vargas will perform one lower body dressing activity with minimal assistance with adaptive equipment to demonstrate improved functional mobility and safety. GOAL MET 08/01/2019  2.  Brandy Vargas will perform one lower body bathing activity with minimal  assistance with adaptive equipment to demonstrate improved functional mobility and safety. GOAL MET 08/01/2019  3.  Brandy Vargas will perform toileting/toilet transfer with contact guard assistance with adaptive equipment to demonstrate improved functional mobility and safety. GOAL MET 08/01/2019  4.  Brandy Vargas will perform shower transfer with contact guard assistance with adaptive equipment to demonstrate improved functional mobility and safety. GOAL MET 08/01/2019  5.  Brandy Vargas will state THA precautions with two verbal cues to demonstrate improved functional mobility and safety.  GOAL MET 08/01/2019       JOINT CAMP OCCUPATIONAL THERAPY THA: Daily Note, Discharge, Treatment Day: 2nd and AM 08/01/2019  INPATIENT: Hospital Day: 3  Payor: AARP MEDICARE COMPLETE / Plan: [facility] AARP MEDICARE COMPLETE / Product Type: Managed Care Medicare /      NAME/AGE/GENDER: Brandy Vargas is a 79 y.o. 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  Patient completed shower and dressing as charted below in ADL grid and is ambulating with rolling walker with  supervision.  Patient has met 5/5 goals and plans to return home with good family support.  Family able to provide patient with appropriate level of assistance at this time.  OT reviewed hip precautions throughout session. Patient instructed to call for assistance when needing to get up from recliner and all needs in reach.  Patient verbalized understanding of call light.        This section established at most recent assessment   PROBLEM LIST (Impairments causing functional limitations):  1. Decreased Strength  2. Decreased ADL/Functional Activities  3. Decreased Transfer Abilities  4. Increased Pain  5. Increased Fatigue  6. Decreased Flexibility/Joint Mobility  7. Decreased Knowledge of Precautions   INTERVENTIONS PLANNED: (Benefits and precautions of occupational therapy have been discussed with the patient.)  1. Activities of daily living training  2. Adaptive equipment training  3. Balance training  4. Clothing management  5. Donning&doffing training  6. Theraputic activity     TREATMENT PLAN: Frequency/Duration: Follow patient 1-2 sessions to address above goals.  Rehabilitation Potential For Stated Goals: Good     RECOMMENDED REHABILITATION/EQUIPMENT: (at time of discharge pending progress): Continue Skilled Therapy.              OCCUPATIONAL PROFILE AND HISTORY:   History of Present Injury/Illness (Reason for Referral):  Pt presents this date s/p (L) THA.    Past Medical History/Comorbidities:   Brandy Vargas  has a past medical history of Arthritis, Atrial fibrillation (HCC), Breast cancer (HCC) (2015), COVID-19 vaccine series completed (04/08/2019), GERD (  gastroesophageal reflux disease), Hypertension, and Overactive bladder. She also has no past medical history of Malignant hyperthermia due to anesthesia or Pseudocholinesterase deficiency.  Brandy Vargas  has a past surgical history that includes hx appendectomy; hx tubal ligation (1976); pr breast surgery procedure unlisted (Right, 2015); hx knee  arthroscopy; and hx breast lumpectomy (Right).  Social History/Living Environment:   Home Environment: Private residence  # Steps to Enter: 5  One/Two Story Residence: One story  Living Alone: Yes  Support Systems: Child(ren)  Patient Expects to be Discharged to:: [employer]  Current DME Used/Available at Home: None    Prior Level of Function/Work/Activity:  Independent, lives alone, has supportive family     Number of Personal Factors/Comorbidities that affect the Plan of Care: Brief history (0):  LOW COMPLEXITY   ASSESSMENT OF OCCUPATIONAL PERFORMANCE::   Most Recent Physical Functioning:   Balance  Sitting: Intact  Standing: Intact                              Mental Status  Neurologic State: Alert  Orientation Level: Oriented X4  Cognition: Appropriate decision making;Appropriate for age attention/concentration  Perception: Appears intact  Perseveration: No perseveration noted  Safety/Judgement: Fall prevention                Basic ADLs (From Assessment) Complex ADLs (From Assessment)   Basic ADL  Feeding: Independent  Oral Facial Hygiene/Grooming: Independent  Bathing: Moderate assistance  Type of Bath: Chlorhexidine (CHG), Full, Shower  Upper Body Dressing: Setup  Lower Body Dressing: Moderate assistance  Toileting: Minimum assistance     Grooming/Bathing/Dressing Activities of Daily Living   Grooming  Grooming Assistance: Independent  Washing Face: Independent  Washing Hands: Independent Cognitive Retraining  Safety/Judgement: Fall prevention   Upper Body Bathing  Bathing Assistance: Modified independent  Position Performed: Seated in chair  Adaptive Equipment: Shower chair     Lower Body Bathing  Bathing Assistance: Modified independent  Perineal  : Independent  Lower Body : Modified independent  Adaptive Equipment: Shower chair;Long handled sponge     Upper Body Dressing Assistance  Dressing Assistance: Independent  Bra: Independent  Pullover Shirt: Independent Functional Transfers  Toilet Transfer : Modified  independent  Shower Transfer: Modified independent   Lower Body Dressing Assistance  Dressing Assistance: Minimum assistance  Underpants: Modified independent  Pants With Elastic Waist: Modified independent  Socks: Minimum assistance  Cues: Doff;Don  Adaptive Equipment Used: Reacher;Sock aid;Long handled shoe horn Bed/Mat Mobility  Sit to Stand: Modified independent  Bed to Chair: Modified independent         Physical Skills Involved:  1. Range of Motion  2. Balance  3. Strength Cognitive Skills Affected (resulting in the inability to perform in a timely and safe manner):  1. [facility] Psychosocial Skills Affected:  1. Environmental Adaptation   Number of elements that affect the Plan of Care: 3-5:  MODERATE COMPLEXITY   CLINICAL DECISION MAKING:   [employer] AM-PACT "6 Clicks"   Daily Activity Inpatient Short Form  How much help from another person does the patient currently need... Total A Lot A Little None   1.  Putting on and taking off regular lower body clothing?   []  1   [x]  2   []  3   []  4   2.  Bathing (including washing, rinsing, drying)?   []  1   [x]  2   []  3   []  4  3.  Toileting, which includes using toilet, bedpan or urinal?   []  1   [x]  2   []  3   []  4   4.  Putting on and taking off regular upper body clothing?   []  1   []  2   []  3   [x]  4   5.  Taking care of personal grooming such as brushing teeth?   []  1   []  2   []  3   [x]  4   6.  Eating meals?   []  1   []  2   []  3   [x]  4    2007, Trustees of [address], under license to [place] [place]. All rights reserved     Score:  Initial: 18 Most Recent: X (Date: -- )    Interpretation of Tool:  Represents activities that are increasingly more difficult (i.e. Bed mobility, Transfers, Gait).     Use of outcome tool(s) and clinical judgement create a POC that gives a: LOW COMPLEXITY            TREATMENT:   (In addition to Assessment/Re-Assessment sessions the following treatments were rendered)     Pre-treatment Symptoms/Complaints:    Pain: Initial:    Pain Intensity 1: 0  Post Session:  0     Self Care: (38): Procedure(s) (per grid) utilized to improve and/or restore self-care/home management as related to dressing, bathing, toileting, grooming and functional mobility. Required minimal verbal and tactile cueing to facilitate activities of daily living skills and compensatory activities.        Treatment/Session Assessment:     Response to Treatment:  Good, up in chair.    Education:  []  Home Exercises  [x]  Fall Precautions  [x]  Hip Precautions []  Going Home Video  []  Knee/Hip Prosthesis Review  [x]  Walker Management/Safety [x]  Adaptive Equipment as Needed       Interdisciplinary Collaboration:   o Physical Therapist  o Certified Occupational Therapy Assistant  o Registered Nurse    After treatment position/precautions:   o Up in chair  o Bed/Chair-wheels locked  o Caregiver at bedside  o Call light within reach  o RN notified     Compliance with Program/Exercises: Will assess as treatment progresses.    Pt doing well all goals met and will do well at home with support from family. Patient will be discharged home with home health PT. No further Occupational Therapy warranted, will discharge Occupational Therapy services.      Total Treatment Duration:  OT Patient Time In/Time Out  Time In: 0910  Time Out: 0948     Corean LITTIE Kurtz, COTA

## 2019-08-03 ENCOUNTER — Inpatient Hospital Stay
Admit: 2019-08-03 | Discharge: 2019-08-12 | Disposition: A | Discharge status: Skilled Nursing Facility | Payer: MEDICARE | Attending: Internal Medicine | Admitting: Internal Medicine

## 2019-08-03 ENCOUNTER — Encounter: Primary: Family Medicine

## 2019-08-03 ENCOUNTER — Emergency Department: Admit: 2019-08-03 | Payer: MEDICARE | Primary: Family Medicine

## 2019-08-03 DIAGNOSIS — E871 Hypo-osmolality and hyponatremia: Secondary | ICD-10-CM

## 2019-08-03 LAB — BASIC METABOLIC PANEL
Anion Gap: 9 mmol/L (ref 7–16)
BUN: 17 MG/DL (ref 8–23)
CO2: 24 mmol/L (ref 21–32)
Calcium: 7.8 MG/DL — ABNORMAL LOW (ref 8.3–10.4)
Chloride: 84 mmol/L — ABNORMAL LOW (ref 98–107)
Creatinine: 0.61 MG/DL (ref 0.6–1.0)
EGFR IF NonAfrican American: >60 mL/min/{1.73_m2} (ref >60)
GFR African American: >60 mL/min/{1.73_m2} (ref >60)
Glucose: 109 mg/dL — ABNORMAL HIGH (ref 65–100)
Potassium: 4.6 mmol/L (ref 3.5–5.1)
Sodium: 117 mmol/L — CL (ref 136–145)

## 2019-08-03 LAB — URINALYSIS W/ RFLX MICROSCOPIC
Bilirubin, Urine: NEGATIVE
Bilirubin: NEGATIVE
Blood, Urine: NEGATIVE
Blood: NEGATIVE
Glucose, Ur: NEGATIVE mg/dL
Glucose: NEGATIVE mg/dL
Ketone: 15 mg/dL — AB
Ketones, Urine: 15 mg/dL — AB
Leukocyte Esterase, Urine: NEGATIVE
Leukocyte Esterase: NEGATIVE
Nitrite, Urine: NEGATIVE
Nitrites: NEGATIVE
Protein, UA: NEGATIVE mg/dL
Protein: NEGATIVE mg/dL
Specific Gravity, UA: 1.008 (ref 1.001–1.023)
Specific gravity: 1.008 (ref 1.001–1.023)
Urobilinogen, UA, POCT: 0.2 EU/dL (ref 0.2–1.0)
Urobilinogen: 0.2 EU/dL (ref 0.2–1.0)
pH (UA): 6 (ref 5.0–9.0)
pH, UA: 6 (ref 5.0–9.0)

## 2019-08-03 LAB — CBC WITH AUTO DIFFERENTIAL
Basophils %: 0 % (ref 0.0–2.0)
Basophils Absolute: 0 10*3/uL (ref 0.0–0.2)
Eosinophils %: 1 % (ref 0.5–7.8)
Eosinophils Absolute: 0.1 10*3/uL (ref 0.0–0.8)
Granulocyte Absolute Count: 0.1 10*3/uL (ref 0.0–0.5)
Hematocrit: 18.7 % — ABNORMAL LOW (ref 35.8–46.3)
Hemoglobin: 6.6 g/dL — CL (ref 11.7–15.4)
Immature Granulocytes: 1 % (ref 0.0–5.0)
Lymphocytes %: 15 % (ref 13–44)
Lymphocytes Absolute: 2.2 10*3/uL (ref 0.5–4.6)
MCH: 29.5 PG (ref 26.1–32.9)
MCHC: 35.3 g/dL — ABNORMAL HIGH (ref 31.4–35.0)
MCV: 83.5 FL (ref 79.6–97.8)
MPV: 11.2 FL (ref 9.4–12.3)
Monocytes %: 7 % (ref 4.0–12.0)
Monocytes Absolute: 0.9 10*3/uL (ref 0.1–1.3)
NRBC Absolute: 0 10*3/uL (ref 0.0–0.2)
Neutrophils %: 77 % (ref 43–78)
Neutrophils Absolute: 11.1 10*3/uL — ABNORMAL HIGH (ref 1.7–8.2)
Platelets: 184 10*3/uL (ref 150–450)
RBC: 2.24 M/uL — ABNORMAL LOW (ref 4.05–5.2)
RDW: 12.5 % (ref 11.9–14.6)
WBC: 14.5 10*3/uL — ABNORMAL HIGH (ref 4.3–11.1)

## 2019-08-03 LAB — OSMOLALITY, URINE: Osmolality, Urine: 236 MOSM/kg H2O (ref 50–1400)

## 2019-08-03 LAB — TRIGLYCERIDES: Triglycerides: 80 MG/DL (ref 35–150)

## 2019-08-03 LAB — RBC, ALLOCATE

## 2019-08-03 LAB — T4, FREE
T4 Free: 1 NG/DL (ref 0.78–1.46)
T4, Free: 1 NG/DL (ref 0.78–1.46)

## 2019-08-03 LAB — MAGNESIUM
Magnesium: 1.6 mg/dL — ABNORMAL LOW (ref 1.8–2.4)
Magnesium: 1.6 mg/dL — ABNORMAL LOW (ref 1.8–2.4)

## 2019-08-03 LAB — HEMOGLOBIN AND HEMATOCRIT
Hematocrit: 22.2 % — ABNORMAL LOW (ref 35.8–46.3)
Hemoglobin: 8 g/dL — ABNORMAL LOW (ref 11.7–15.4)

## 2019-08-03 LAB — PHOSPHORUS
Phosphorus: 2.7 MG/DL (ref 2.3–3.7)
Phosphorus: 2.7 MG/DL (ref 2.3–3.7)

## 2019-08-03 LAB — SODIUM
Sodium: 117 mmol/L — CL (ref 136–145)
Sodium: 117 mmol/L — CL (ref 136–145)
Sodium: 120 mmol/L — ABNORMAL LOW (ref 136–145)
Sodium: 120 mmol/L — ABNORMAL LOW (ref 136–145)

## 2019-08-03 LAB — SODIUM, UR, RANDOM
Sodium,Ur: 17 MMOL/L
Sodium,urine random: 17 MMOL/L

## 2019-08-03 LAB — OSMOLALITY, SERUM/PLASMA
Osmolality, serum/plasma: 246 MOSM/kg H2O — ABNORMAL LOW (ref 280–301)
Osmolality: 246 MOSM/kg H2O — ABNORMAL LOW (ref 280–301)

## 2019-08-03 LAB — TSH 3RD GENERATION
TSH: 1.1 u[IU]/mL
TSH: 1.1 u[IU]/mL

## 2019-08-03 LAB — METABOLIC PANEL, BASIC
Anion gap: 9 mmol/L (ref 7–16)
BUN: 17 MG/DL (ref 8–23)
CO2: 24 mmol/L (ref 21–32)
Calcium: 7.8 MG/DL — ABNORMAL LOW (ref 8.3–10.4)
Chloride: 84 mmol/L — ABNORMAL LOW (ref 98–107)
Creatinine: 0.61 MG/DL (ref 0.6–1.0)
GFR est AA: >60 mL/min/{1.73_m2} (ref >60)
GFR est non-AA: >60 mL/min/{1.73_m2} (ref >60)
Glucose: 109 mg/dL — ABNORMAL HIGH (ref 65–100)
Potassium: 4.6 mmol/L (ref 3.5–5.1)
Sodium: 117 mmol/L — CL (ref 136–145)

## 2019-08-03 LAB — CBC WITH AUTOMATED DIFF
ABS. BASOPHILS: 0 10*3/uL (ref 0.0–0.2)
ABS. EOSINOPHILS: 0.1 10*3/uL (ref 0.0–0.8)
ABS. IMM. GRANS.: 0.1 10*3/uL (ref 0.0–0.5)
ABS. LYMPHOCYTES: 2.2 10*3/uL (ref 0.5–4.6)
ABS. MONOCYTES: 0.9 10*3/uL (ref 0.1–1.3)
ABS. NEUTROPHILS: 11.1 10*3/uL — ABNORMAL HIGH (ref 1.7–8.2)
ABSOLUTE NRBC: 0 10*3/uL (ref 0.0–0.2)
BASOPHILS: 0 % (ref 0.0–2.0)
EOSINOPHILS: 1 % (ref 0.5–7.8)
HCT: 18.7 % — ABNORMAL LOW (ref 35.8–46.3)
HGB: 6.6 g/dL — CL (ref 11.7–15.4)
IMMATURE GRANULOCYTES: 1 % (ref 0.0–5.0)
LYMPHOCYTES: 15 % (ref 13–44)
MCH: 29.5 PG (ref 26.1–32.9)
MCHC: 35.3 g/dL — ABNORMAL HIGH (ref 31.4–35.0)
MCV: 83.5 FL (ref 79.6–97.8)
MONOCYTES: 7 % (ref 4.0–12.0)
MPV: 11.2 FL (ref 9.4–12.3)
NEUTROPHILS: 77 % (ref 43–78)
PLATELET: 184 10*3/uL (ref 150–450)
RBC: 2.24 M/uL — ABNORMAL LOW (ref 4.05–5.2)
RDW: 12.5 % (ref 11.9–14.6)
WBC: 14.5 10*3/uL — ABNORMAL HIGH (ref 4.3–11.1)

## 2019-08-03 LAB — TRIGLYCERIDE: Triglyceride: 80 MG/DL (ref 35–150)

## 2019-08-03 LAB — HGB & HCT
HCT: 22.2 % — ABNORMAL LOW (ref 35.8–46.3)
HGB: 8 g/dL — ABNORMAL LOW (ref 11.7–15.4)

## 2019-08-03 LAB — OSMOLALITY, UR: Osmolality,urine: 236 MOSM/kg H2O (ref 50–1400)

## 2019-08-03 MED ORDER — SODIUM CHLORIDE 0.9 % IJ SYRG
Freq: Three times a day (TID) | INTRAMUSCULAR | Status: DC
Start: 2019-08-03 — End: 2019-08-12
  Administered 2019-08-03 – 2019-08-12 (×25): via INTRAVENOUS

## 2019-08-03 MED ORDER — SODIUM CHLORIDE 0.9 % IV
INTRAVENOUS | Status: DC
Start: 2019-08-03 — End: 2019-08-03
  Administered 2019-08-03: 14:00:00 via INTRAVENOUS

## 2019-08-03 MED ORDER — ASPIRIN 81 MG CHEWABLE TAB
81 mg | Freq: Every day | ORAL | Status: DC
Start: 2019-08-03 — End: 2019-08-07
  Administered 2019-08-03 – 2019-08-06 (×3): via ORAL

## 2019-08-03 MED ORDER — METOPROLOL TARTRATE 50 MG TAB
50 mg | Freq: Two times a day (BID) | ORAL | Status: DC
Start: 2019-08-03 — End: 2019-08-12
  Administered 2019-08-03 – 2019-08-12 (×17): via ORAL

## 2019-08-03 MED ORDER — TUBERCULIN PPD 5 UNIT/0.1 ML INTRADERMAL
5 tub. unit /0.1 mL | Freq: Once | INTRADERMAL | Status: AC
Start: 2019-08-03 — End: 2019-08-04

## 2019-08-03 MED ORDER — HYDROMORPHONE 1 MG/ML INJECTION SOLUTION
1 mg/mL | Freq: Four times a day (QID) | INTRAMUSCULAR | Status: DC | PRN
Start: 2019-08-03 — End: 2019-08-09
  Administered 2019-08-04: 10:00:00 via INTRAVENOUS

## 2019-08-03 MED ORDER — SODIUM CHLORIDE 0.9 % IV
INTRAVENOUS | Status: DC | PRN
Start: 2019-08-03 — End: 2019-08-12

## 2019-08-03 MED ORDER — SODIUM CHLORIDE 0.9 % IJ SYRG
INTRAMUSCULAR | Status: DC | PRN
Start: 2019-08-03 — End: 2019-08-12
  Administered 2019-08-03 – 2019-08-09 (×5): via INTRAVENOUS

## 2019-08-03 MED ORDER — VALSARTAN 40 MG TAB
40 mg | Freq: Every day | ORAL | Status: DC
Start: 2019-08-03 — End: 2019-08-03

## 2019-08-03 MED ORDER — SUCRALFATE 1 GRAM TAB
1 gram | Freq: Four times a day (QID) | ORAL | Status: DC
Start: 2019-08-03 — End: 2019-08-12
  Administered 2019-08-03 – 2019-08-12 (×36): via ORAL

## 2019-08-03 MED ORDER — VALSARTAN 320 MG TAB
320 mg | Freq: Every day | ORAL | Status: DC
Start: 2019-08-03 — End: 2019-08-07
  Administered 2019-08-05 – 2019-08-06 (×2): via ORAL

## 2019-08-03 MED ORDER — ACETAMINOPHEN 325 MG TABLET
325 mg | ORAL | Status: DC | PRN
Start: 2019-08-03 — End: 2019-08-12
  Administered 2019-08-09 – 2019-08-10 (×2): via ORAL

## 2019-08-03 MED ORDER — SODIUM CHLORIDE 0.9 % IV
INTRAVENOUS | Status: DC | PRN
Start: 2019-08-03 — End: 2019-08-03

## 2019-08-03 MED ORDER — SODIUM CHLORIDE 0.9 % INJECTION
25 mg/mL | Freq: Four times a day (QID) | INTRAMUSCULAR | Status: DC | PRN
Start: 2019-08-03 — End: 2019-08-12
  Administered 2019-08-04: 10:00:00 via INTRAVENOUS

## 2019-08-03 MED ORDER — TROSPIUM 20 MG TAB
20 mg | Freq: Every day | ORAL | Status: DC
Start: 2019-08-03 — End: 2019-08-12
  Administered 2019-08-05 – 2019-08-12 (×8): via ORAL

## 2019-08-03 MED ORDER — SODIUM CHLORIDE 0.9 % IV
INTRAVENOUS | Status: DC
Start: 2019-08-03 — End: 2019-08-04
  Administered 2019-08-03 – 2019-08-04 (×4): via INTRAVENOUS

## 2019-08-03 MED ORDER — ATORVASTATIN 10 MG TAB
10 mg | Freq: Every evening | ORAL | Status: DC
Start: 2019-08-03 — End: 2019-08-12
  Administered 2019-08-04 – 2019-08-12 (×11): via ORAL

## 2019-08-03 MED ORDER — CALCIUM CARBONATE 200 MG (500 MG) CHEWABLE TAB
200 mg calcium (500 mg) | Freq: Three times a day (TID) | ORAL | Status: DC | PRN
Start: 2019-08-03 — End: 2019-08-12

## 2019-08-03 MED ORDER — NALOXONE 0.4 MG/ML INJECTION
0.4 mg/mL | INTRAMUSCULAR | Status: DC | PRN
Start: 2019-08-03 — End: 2019-08-12

## 2019-08-03 MED ORDER — OXYCODONE 5 MG TAB
5 mg | ORAL | Status: DC | PRN
Start: 2019-08-03 — End: 2019-08-09
  Administered 2019-08-04 – 2019-08-05 (×4): via ORAL

## 2019-08-03 MED ORDER — FAMOTIDINE 20 MG TAB
20 mg | Freq: Two times a day (BID) | ORAL | Status: DC
Start: 2019-08-03 — End: 2019-08-06
  Administered 2019-08-03 – 2019-08-06 (×4): via ORAL

## 2019-08-03 MED FILL — SODIUM CHLORIDE 0.9 % IV: INTRAVENOUS | Qty: 250

## 2019-08-03 MED FILL — ASPIRIN 81 MG CHEWABLE TAB: 81 mg | ORAL | Qty: 1

## 2019-08-03 MED FILL — FAMOTIDINE 20 MG TAB: 20 mg | ORAL | Qty: 1

## 2019-08-03 MED FILL — METOPROLOL TARTRATE 25 MG TAB: 25 mg | ORAL | Qty: 2

## 2019-08-03 MED FILL — SUCRALFATE 1 GRAM TAB: 1 gram | ORAL | Qty: 1

## 2019-08-03 MED FILL — VALSARTAN 320 MG TAB: 320 mg | ORAL | Qty: 1

## 2019-08-03 MED FILL — SODIUM CHLORIDE 0.9 % IV: INTRAVENOUS | Qty: 1000

## 2019-08-03 MED FILL — TROSPIUM 20 MG TAB: 20 mg | ORAL | Qty: 1

## 2019-08-03 NOTE — ED Provider Notes (Signed)
Patient is an 79 year old female presenting to the emergency department today after a rough weekend at home.  The patient had a total hip replacement on the left done Thursday.  She was discharged from the hospital on Saturday after "barely passing her physical therapy evaluation."  She has had some confusion over the last couple of days.  Her son has been helping her at home and he tells me that she was talking about being discharged from the [facility] and has been very restless throughout the night.  They have tried to minimize the amount of narcotic pain medicine since being home but did get 2 doses yesterday.  She has not had a bowel movement since the surgery but is passing gas.  She has had a lot of nausea which is somewhat controlled with Zofran.  Son tells me that this morning he found his mother on the ground after she wiggled out of her recliner and slid down to the ground.  This is the second time this is happened now.  There was no real fall.           Past Medical History:   Diagnosis Date   ??? Arthritis    ??? Atrial fibrillation (HCC)    ??? Breast cancer (HCC) 2015    right    ??? COVID-19 vaccine series completed 04/08/2019    Moderna   ??? GERD (gastroesophageal reflux disease)    ??? Hypertension    ??? Overactive bladder        Past Surgical History:   Procedure Laterality Date   ??? HX APPENDECTOMY     ??? HX BREAST LUMPECTOMY Right     2015   ??? HX KNEE ARTHROSCOPY     ??? HX TUBAL LIGATION  1976   ??? PR BREAST SURGERY PROCEDURE UNLISTED Right 2015    cancer         Family History:   Problem Relation Age of Onset   ??? Hypertension Mother    ??? High Cholesterol Mother    ??? Heart Attack Father    ??? Diabetes Sister    ??? Diabetes Paternal Grandmother    ??? Breast Cancer Maternal Aunt        Social History     Socioeconomic History   ??? Marital status: WIDOWED     Spouse name: Not on file   ??? Number of children: Not on file   ??? Years of education: Not on file   ??? Highest education level: Not on file   Occupational History   ???  Not on file   Tobacco Use   ??? Smoking status: Never Smoker   ??? Smokeless tobacco: Never Used   Vaping Use   ??? Vaping Use: Never used   Substance and Sexual Activity   ??? Alcohol use: Never   ??? Drug use: Never   ??? Sexual activity: Not on file   Other Topics Concern   ??? Not on file   Social History Narrative   ??? Not on file     Social Determinants of Health     Financial Resource Strain:    ??? Difficulty of Paying Living Expenses:    Food Insecurity:    ??? Worried About Programme researcher, broadcasting/film/video in the Last Year:    ??? Barista in the Last Year:    Transportation Needs:    ??? Freight forwarder (Medical):    ??? Lack of Transportation (Non-Medical):    Physical Activity:    ???  Days of Exercise per Week:    ??? Minutes of Exercise per Session:    Stress:    ??? Feeling of Stress :    Social Connections:    ??? Frequency of Communication with Friends and Family:    ??? Frequency of Social Gatherings with Friends and Family:    ??? Attends Religious Services:    ??? Database administrator or Organizations:    ??? Attends Engineer, structural:    ??? Marital Status:    Intimate Programme researcher, broadcasting/film/video Violence:    ??? Fear of Current or Ex-Partner:    ??? Emotionally Abused:    ??? Physically Abused:    ??? Sexually Abused:          ALLERGIES: Omeprazole magnesium    Review of Systems   Gastrointestinal: Positive for nausea.   Musculoskeletal:        Postop pain   Psychiatric/Behavioral: Positive for confusion.   All other systems reviewed and are negative.      Vitals:    08/03/19 0703 08/03/19 0809   BP: 136/63    Pulse: 86    Resp: 17    Temp: 99 ??F (37.2 ??C)    SpO2: 99% 99%   Weight: 77.1 kg (170 lb)    Height: 5\' 3"  (1.6 m)             Physical Exam     GENERAL:The patient has Body mass index is 30.11 kg/m??. Well-hydrated.  No acute distress.  VITAL SIGNS: Heart rate, blood pressure, respiratory rate reviewed as recorded in  nurse's notes  EYES: Pupils reactive. Extraocular motion intact. No conjunctival redness or drainage.  NECK: Supple, no  meningeal signs. Trachea midline. No masses or thyromegaly.  LUNGS: Breath sounds clear and equal bilaterally no accessory muscle use  CHEST: No deformity  CARDIOVASCULAR: Regular rate and rhythm  ABDOMEN: Hypoactive bowel sounds.  Soft without tenderness. No palpable masses or organomegaly. No peritoneal signs. No rigidity.  EXTREMITIES: No clubbing or cyanosis. No joint swelling. Normal muscle tone. No  restricted range of motion appreciated.  NEUROLOGIC: Sensation is grossly intact. Cranial nerve exam reveals face is  symmetrical, tongue is midline speech is clear.  SKIN: No rash or petechiae. Good skin turgor palpated.  PSYCHIATRIC: Alert and oriented. Appropriate behavior and judgment.      MDM  Number of Diagnoses or Management Options  Diagnosis management comments: Sprain, strain, tendon injury, contusion,    Abrasion, laceration, neurovascular injury, foreign body    Fracture, open fracture, dislocation, joint separation, articular surface injury,    Pneumonia, UTI, adverse medication reaction to opioids, constipation,         Amount and/or Complexity of Data Reviewed  Clinical lab tests: ordered and reviewed  Tests in the radiology section of CPT??: ordered and reviewed  Tests in the medicine section of CPT??: reviewed and ordered  Review and summarize past medical records: yes  Independent visualization of images, tracings, or specimens: yes      ED Course as of Aug 02 837   [facility] Aug 03, 2019   Aug 05, 2019 On repeat exam the patient is seeing ants crawling on the walls where there are none here in the emergency department.  Her hemoglobin is noted to be low and I talked to her and son about need for blood transfusion and repeat admission to the hospital.    [KH]   1610 IMPRESSION  1.  No acute radiographic abnormality of the chest or abdomen.  2.  Moderate retained fecal burden in the right colon.   XR ABD ACUTE W 1 V CHEST [KH]   0828 I talked to the patient and her son about the findings in the emergency  department and need for admission to the hospital.  She was started on IV fluids and 1 unit of packed red blood cells was ordered.  The hospitalist was consulted through the perfect [facility] system and they will come and see her in the ER.    [KH]      ED Course User Index  [KH] Natale Milch, DO       Critical Care  Performed by: Natale Milch, DO  Authorized by: Natale Milch, DO     Comments:      Critical care time: 79 minutes of critical care time was performed in the emergency department. This was separate from any other procedures listed during the patients emergency department course. The failure to initiate these interventions on an urgent basis would likely have resulted in sudden, clinically significant or life-threatening deterioration in the patients condition.    EKG    Date/Time: 08/03/2019 8:38 AM  Performed by: Natale Milch, DO  Authorized by: Natale Milch, DO     ECG reviewed by ED Physician in the absence of a cardiologist: yes    Comments:      Sinus rhythm at 78 bpm.  Narrow QRS complex with no ST elevations present.

## 2019-08-03 NOTE — Progress Notes (Signed)
Patient turned and back care.  Pure wick placed.

## 2019-08-03 NOTE — Progress Notes (Signed)
Problem: Pressure Injury - Risk of  Goal: *Prevention of pressure injury  Description: Document Braden Scale and appropriate interventions in the flowsheet.  Outcome: Progressing Towards Goal  Note: Pressure Injury Interventions:  Sensory Interventions: Monitor skin under medical devices, Minimize linen layers, Assess changes in LOC         Activity Interventions: PT/OT evaluation    Mobility Interventions: PT/OT evaluation    Nutrition Interventions: Document food/fluid/supplement intake    Friction and Shear Interventions: Apply protective barrier, creams and emollients                Problem: Patient Education: Go to Patient Education Activity  Goal: Patient/Family Education  Outcome: Progressing Towards Goal     Problem: Falls - Risk of  Goal: *Absence of Falls  Description: Document Schmid Fall Risk and appropriate interventions in the flowsheet.  Outcome: Progressing Towards Goal  Note: Fall Risk Interventions:                 Elimination Interventions: Patient to call for help with toileting needs, Toileting schedule/hourly rounds              Problem: Patient Education: Go to Patient Education Activity  Goal: Patient/Family Education  Outcome: Progressing Towards Goal     Problem: Pressure Injury - Risk of  Goal: *Prevention of pressure injury  Description: Document Braden Scale and appropriate interventions in the flowsheet.  Outcome: Progressing Towards Goal  Note: Pressure Injury Interventions:  Sensory Interventions: Monitor skin under medical devices, Minimize linen layers, Assess changes in LOC         Activity Interventions: PT/OT evaluation    Mobility Interventions: PT/OT evaluation    Nutrition Interventions: Document food/fluid/supplement intake    Friction and Shear Interventions: Apply protective barrier, creams and emollients                Problem: Patient Education: Go to Patient Education Activity  Goal: Patient/Family Education  Outcome: Progressing Towards Goal     Problem: Falls - Risk  of  Goal: *Absence of Falls  Description: Document Schmid Fall Risk and appropriate interventions in the flowsheet.  Outcome: Progressing Towards Goal  Note: Fall Risk Interventions:                 Elimination Interventions: Patient to call for help with toileting needs, Toileting schedule/hourly rounds              Problem: Patient Education: Go to Patient Education Activity  Goal: Patient/Family Education  Outcome: Progressing Towards Goal

## 2019-08-03 NOTE — ED Notes (Signed)
 Patient presents from home. Son reports increased weakness and confusion since hip surgery 07/30/2019. Answers orientation questions appropriately however sees ants on the wall that are not there and is confused about where she had surgery.

## 2019-08-03 NOTE — ED Notes (Signed)
Pt presented to ED via EMS from home with c/o fall this morning, no LOC witnessed by family. Denies pain, denies hitting head, resp even/nl, NAD, recent left hip surgery.

## 2019-08-03 NOTE — H&P (Signed)
H&P by Cammy Copa, DO at 08/03/19 1610                Author: Cammy Copa, DO  Service: Internal Medicine  Author Type: Physician       Filed: 08/03/19 1116  Date of Service: 08/03/19 0956  Status: Signed          Editor: Cammy Copa, DO (Physician)                                       [facility]P         Patient ID:   NAME:  Brandy Vargas    Age/Sex: 79 y.o. female   DOB:   Dec 03, 1940    MRN:   [id number]     Admission Date: 08/03/2019   Chief Complaint: N/V and confusion  Reason for Admission:  Acute hyponatremia        Assessment/Plan (MDM):        Principal Problem:     Acute hyponatremia (08/03/2019)   - Sodium 138 (07/14/19) --> 117 (08/03/19)   - Likely due to vomiting and dehydration   - Start normal saline @ 100 ml/hr   - Check UA   - Check urine sodium + osm   - Check serum osm   - Check TFTs   - Check Triglycerides   - Trend sodium Q4H until >125 then Q6H   - Strict I/O      Active Problems:     ABLA (acute blood loss anemia) (08/03/2019)   - Likely due to left THA   - Hgb 7.4 at discharge --> now 6.6   - Transfuse 1U PRBC   - Check Hgb post-transfusion   - Transfuse Hgb <7.0        Acute metabolic encephalopathy (08/03/2019)   - Appears to be mostly resolved   - Still thought iniitally she was at [facility]   - Likely due to hyponatremia   - Check UA   - Check TFTs        Nausea and vomiting (08/03/2019)   - Unsure etiology --> has been happening since post-op per son   - No abdominal pain   - Start Zofran PRN   - Start Phenergan PRN        Leukocytosis (08/03/2019)   - I suspect his is reactive   - Start normal saline   - Check UA        Essential hypertension (04/28/2019)   - Stable   - Continue Lopressor   - Continue ARB        History of total hip arthroplasty, 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)   - Incision with bloody drainage   - PT/OT   - Pain control        Paroxysmal atrial fibrillation (HCC) (04/28/2019)   - Currently NSR   - Continue Lopressor   - Continue low dose Eliquis       Disposition: Home vs SNF in 2-3 days   Diet: Regular   VTE ppx: Eliquis  GI ppx: Pepcid  CODE STATUS: FULL CODE      PCP: Delana Meyer, MD      Attending MD: Cammy Copa, DO      Treatment Team: Attending Provider: Cammy Copa, DO        HPI:        Brandy Vargas is a 79 year old CF with a PMH of PAF, HTN, GERD, and h/o breast CA who presented to the ER with 3-4 days of nausea and vomiting and 24 hours of waxing and waning confusion and hallucinating. She had a left THA on 07/30/19 and did well  with the surgery. Per the son, the patient was nauseated and had some vomiting post-op. She continued to have this, but was discharged on 08/01/19. The patient went home and continued to have vomiting and was unable to hold anything down. Then on the evening  of 08/02/19 she became confused and was seeing ants on her walls. Her son got her comfortable in the chair and she fell asleep. On the morning of 08/03/19 he found the patient in the floor so he called EMS. Complains of indigestion. Denies CP/SOB. Denies  dizziness. Denies diarrhea. Denies abdominal pain.      Complete ROS done and is as stated in HPI or otherwise negative        Past Medical History:        Diagnosis  Date         ?  Arthritis       ?  Atrial fibrillation (HCC)       ?  Breast cancer (HCC)  2015          right          ?  COVID-19 vaccine series completed  04/08/2019          Moderna         ?  GERD (gastroesophageal reflux disease)       ?  Hypertension           ?  Overactive bladder                Past Surgical History:         Procedure  Laterality  Date          ?  HX APPENDECTOMY         ?  HX BREAST LUMPECTOMY  Right            2015          ?  HX KNEE ARTHROSCOPY         ?  HX TUBAL LIGATION    1976     ?  PR BREAST SURGERY PROCEDURE UNLISTED  Right  2015          cancer              Prior to Admission Medications     Prescriptions  Last Dose  Informant  Patient Reported?  Taking?      Eliquis 5 mg tablet      No  No      Sig:  Take 1 Tablet by mouth two (2) times a day. MAY DECREASE ASPIRIN DOSAGE TO ONCE DAILY  AND RESTART ELIQUIS ON Tuesday, AUGUST 3RD, IF NO WOUND  DRAINAGE.      aspirin 81 mg chewable tablet      No  No      Sig: Take 1 Tablet by mouth daily for 5 days. MAY DECREASE ASPIRIN DOSAGE TO ONCE DAILY ONCE RESTART ON ELIQUIS.      calcium carbonate (OS-CAL) 500 mg calcium (1,250 mg) tablet      Yes  No      Sig: Take 1 Tablet by mouth daily.      cholecalciferol (VITAMIN D3) (2,000 UNITS /50 MCG) cap  capsule      Yes  No      Sig: Take 2,000 Units by mouth daily.      dilTIAZem IR (CARDIZEM) 30 mg tablet      Yes  No      Sig: Take 1 tablet po prn q 4h for afib. Heart rate >100      metoprolol tartrate (LOPRESSOR) 50 mg tablet      No  No      Sig: TAKE 1 TABLET BY MOUTH TWICE DAILY      ondansetron hcl (ZOFRAN) 8 mg tablet      No  No      Sig: Take 1 Tablet by mouth every eight (8) hours as needed for Nausea or Vomiting. Indications: prevent nausea and vomiting after surgery      oxyCODONE IR (ROXICODONE) 5 mg immediate release tablet      No  No      Sig: Take 1-2 Tablets by mouth every four (4) hours as needed for Pain for up to 5 days. Max Daily Amount: 60 mg.      simvastatin (ZOCOR) 20 mg tablet      No  No      Sig: TAKE 1 TABLET BY MOUTH ONCE DAILY      Patient taking differently: Take 20 mg by mouth nightly.      solifenacin (VESICARE) 10 mg tablet      No  No      Sig: Take 1 tablet by mouth once daily      telmisartan (MICARDIS) 80 mg tablet      No  No      Sig: TAKE 1 TABLET BY MOUTH ONCE DAILY      Patient taking differently: Take 80 mg by mouth nightly. TAKE 1 TABLET BY MOUTH ONCE DAILY               Facility-Administered Medications: None             Allergies        Allergen  Reactions         ?  Omeprazole Magnesium  Itching              Social History          Tobacco Use         ?  Smoking status:  Never Smoker     ?  Smokeless tobacco:  Never Used       Substance Use Topics         ?  Alcohol use:   Never              Family History         Problem  Relation  Age of Onset          ?  Hypertension  Mother       ?  High Cholesterol  Mother       ?  Heart Attack  Father       ?  Diabetes  Sister       ?  Diabetes  Paternal Grandmother            ?  Breast Cancer  Maternal Aunt                Objective:           Visit Vitals      BP  (!) 141/67     Pulse  74  Temp  98.3 ??F (36.8 ??C)     Resp  14     Ht  5\' 3"  (1.6 m)     Wt  77.1 kg (170 lb)     SpO2  98%        BMI  30.11 kg/m??            Temp (24hrs), Avg:98.7 ??F (37.1 ??C), Min:98.3 ??F (36.8 ??C), Max:99 ??F (37.2 ??C)         Oxygen Therapy   O2 Sat (%): 98 % (08/03/19 0929)   Pulse via Oximetry: 74 beats per minute (08/03/19 0830)   O2 Device: None (Room air) (08/03/19 0809)         Physical Exam:      General:    Alert, cooperative, no distress, appears stated age.      Eyes:   PERRLA EOMI Anicteric   Head:   Normocephalic, without obvious abnormality, atraumatic.   ENT:  Nares normal. No drainage.   Lungs:   Clear to auscultation bilaterally.  No Wheezing or Rhonchi. No rales.   Heart:   Regular rate and rhythm,  no murmur, rub or gallop. No JVD.   Abdomen:   Soft, non-tender. Not distended.  Bowel sounds normal.    MSK:  No edema. No clubbing or cyanosis. No deformities/lesions.   Skin:     Texture, turgor normal. No rashes or lesions.  No Jaundice.   Neurologic: Alert and oriented x 2-3, no focal deficits    Psychiatry:      No depression/anxiety. Mood congruent for context.   Heme/Lymph/Immune: No echymoses or overt signs of bleeding.       Lab/Data Review:     Recent Results (from the past 24 hour(s))     CBC WITH AUTOMATED DIFF          Collection Time: 08/03/19  7:31 AM         Result  Value  Ref Range            WBC  14.5 (H)  4.3 - 11.1 K/uL       RBC  2.24 (L)  4.05 - 5.2 M/uL       HGB  6.6 (LL)  11.7 - 15.4 g/dL       HCT  10/03/19 (L)  33.2 - 46.3 %       MCV  83.5  79.6 - 97.8 FL       MCH  29.5  26.1 - 32.9 PG       MCHC  35.3 (H)  31.4 - 35.0 g/dL        RDW  95.1  88.4 - 14.6 %       PLATELET  184  150 - 450 K/uL       MPV  11.2  9.4 - 12.3 FL       ABSOLUTE NRBC  0.00  0.0 - 0.2 K/uL       DF  AUTOMATED          NEUTROPHILS  77  43 - 78 %       LYMPHOCYTES  15  13 - 44 %       MONOCYTES  7  4.0 - 12.0 %       EOSINOPHILS  1  0.5 - 7.8 %       BASOPHILS  0  0.0 - 2.0 %       IMMATURE GRANULOCYTES  1  0.0 - 5.0 %  ABS. NEUTROPHILS  11.1 (H)  1.7 - 8.2 K/UL       ABS. LYMPHOCYTES  2.2  0.5 - 4.6 K/UL       ABS. MONOCYTES  0.9  0.1 - 1.3 K/UL       ABS. EOSINOPHILS  0.1  0.0 - 0.8 K/UL       ABS. BASOPHILS  0.0  0.0 - 0.2 K/UL       ABS. IMM. GRANS.  0.1  0.0 - 0.5 K/UL       METABOLIC PANEL, BASIC          Collection Time: 08/03/19  7:31 AM         Result  Value  Ref Range            Sodium  117 (LL)  136 - 145 mmol/L       Potassium  4.6  3.5 - 5.1 mmol/L       Chloride  84 (L)  98 - 107 mmol/L            CO2  24  21 - 32 mmol/L            Anion gap  9  7 - 16 mmol/L       Glucose  109 (H)  65 - 100 mg/dL       BUN  17  8 - 23 MG/DL       Creatinine  8.290.61  0.6 - 1.0 MG/DL       GFR est AA  >56>60  >60 ml/min/1.6773m2       GFR est non-AA  >60  >60 ml/min/1.573m2       Calcium  7.8 (L)  8.3 - 10.4 MG/DL       RBC, ALLOCATE          Collection Time: 08/03/19  8:00 AM         Result  Value  Ref Range            HISTORY CHECKED?  Historical check performed         TYPE & SCREEN          Collection Time: 08/03/19  8:02 AM         Result  Value  Ref Range            Crossmatch Expiration  08/06/2019,2359         ABO/Rh(D)  B POSITIVE         Antibody screen  NEG         Unit number  [id number]         Blood component type  RC LR         Unit division  00         Status of unit  ISSUED              Crossmatch result  Compatible             Imaging:   XR PELV AP ONLY      Result Date: 07/30/2019   EXAMINATION: XR PELV AP ONLY 07/30/2019 12:36 PM ACCESSION NUMBER: [id number] INDICATION: Post op total hip COMPARISON: Left hip x-rays 04/22/2019 TECHNIQUE: A single AP  supine view of the pelvis was obtained. FINDINGS: Immediate postoperative changes status  post left hip arthroplasty, including subcutaneous emphysema and soft tissue swelling. Unremarkable immediate postoperative appearance of the hardware. 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 Pubic symphysis degenerative  change.  Unremarkable immediate postoperative appearance of a left hip total arthroplasty. VOICE DICTATED BY: Dr. Caryl Comes      XR ABD ACUTE W 1 V CHEST      Result Date: 08/03/2019   EXAM: Acute abdominal series INDICATION: abd pain. COMPARISON: None available TECHNIQUE: A single view of the chest and 2 views of the abdomen were obtained. FINDINGS: The bowel gas pattern is nonobstructive. No intraperitoneal free air is evident. Moderate  retained fecal burden in the right colon. The lungs are clear and the pulmonary vasculature is normal. No pneumothorax or pleural effusion. The cardiomediastinal silhouette is within normal limits. Left hip arthroplasty is unremarkable.       1.  No acute radiographic abnormality of the chest or abdomen. 2.  Moderate retained fecal burden in the right colon.      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  Standard views with 3-D tomosynthesis Spot CC and MLO views The focal asymmetry likely effaces on compression magnification CC, MLO, and ML views a total symphysis cc and MLO compression magnification views were then  obtained where there is normal superimposed fibroglandular tissue at the area of asymmetry. No concerning mass or calcifications.       Benign left mammogram Recommend screening mammography in one  year. A reminder letter will be scheduled.  This was reported to the patient at the time of interpretation by the techologist. BI-RADS Assessment Category 2: Benign finding          Cardiac Studies:     EKG Results           None                  No results found for this visit on 08/03/19.      Cultures:     All Micro Results           None                    Active Problems:        Principal Diagnosis Acute hyponatremia         Hospital Problems  as of 08/03/2019  Date Reviewed:  August 19, 2019                     Codes  Class  Noted - Resolved  POA              * (Principal) Acute hyponatremia  ICD-10-CM: E87.1   ICD-9-CM: 276.1    08/03/2019 - Present  Yes                        ABLA (acute blood loss anemia)  ICD-10-CM: D62   ICD-9-CM: 285.1    08/03/2019 - Present  Yes                        Acute metabolic encephalopathy  ICD-10-CM: G93.41   ICD-9-CM: 348.31    08/03/2019 - Present  Yes                        Nausea and vomiting  ICD-10-CM: R11.2  ICD-9-CM: 787.01    08/03/2019 - Present  Yes                        Leukocytosis  ICD-10-CM: D72.829   ICD-9-CM: 288.60    08/03/2019 - Present  Yes                        History of total hip arthroplasty, left  ICD-10-CM: G38.756   ICD-9-CM: V43.64    08/03/2019 - Present  Yes                        Essential hypertension  ICD-10-CM: I10   ICD-9-CM: 401.9    04/28/2019 - Present  Yes                        Paroxysmal atrial fibrillation (HCC)  ICD-10-CM: I48.0   ICD-9-CM: 427.31    04/28/2019 - Present  Yes                        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

## 2019-08-03 NOTE — Progress Notes (Signed)
Patient received from the ER.  Patient bathed with CHG wipes.  She has pale skin with multiple moles esp on the torso.  IV in the left forearm is patent, clean, warm and dry.  Left hip has a Aqua seal dressing with bloody drainage underneath the window of the dressing.  Patient has pain with movement in requard to the hip

## 2019-08-03 NOTE — Progress Notes (Signed)
Patient is able to tolerate small amount of food.  She has had her blood infusion and will get labs in an hour.  Lungs are clear and S1 and S2 auscultated.  Saline going in as ordered.

## 2019-08-04 ENCOUNTER — Encounter: Primary: Family Medicine

## 2019-08-04 LAB — BASIC METABOLIC PANEL
Anion Gap: 6 mmol/L — ABNORMAL LOW (ref 7–16)
BUN: 12 MG/DL (ref 8–23)
CO2: 26 mmol/L (ref 21–32)
Calcium: 7.8 MG/DL — ABNORMAL LOW (ref 8.3–10.4)
Chloride: 94 mmol/L — ABNORMAL LOW (ref 98–107)
Creatinine: 0.58 MG/DL — ABNORMAL LOW (ref 0.6–1.0)
EGFR IF NonAfrican American: >60 mL/min/{1.73_m2} (ref >60)
GFR African American: >60 mL/min/{1.73_m2} (ref >60)
Glucose: 109 mg/dL — ABNORMAL HIGH (ref 65–100)
Potassium: 4.2 mmol/L (ref 3.5–5.1)
Sodium: 126 mmol/L — ABNORMAL LOW (ref 136–145)

## 2019-08-04 LAB — CBC
Hematocrit: 22.5 % — ABNORMAL LOW (ref 35.8–46.3)
Hemoglobin: 7.9 g/dL — ABNORMAL LOW (ref 11.7–15.4)
MCH: 29.9 PG (ref 26.1–32.9)
MCHC: 35.1 g/dL — ABNORMAL HIGH (ref 31.4–35.0)
MCV: 85.2 FL (ref 79.6–97.8)
MPV: 10.3 FL (ref 9.4–12.3)
NRBC Absolute: 0 10*3/uL (ref 0.0–0.2)
Platelets: 195 10*3/uL (ref 150–450)
RBC: 2.64 M/uL — ABNORMAL LOW (ref 4.05–5.2)
RDW: 12.9 % (ref 11.9–14.6)
WBC: 10.7 10*3/uL (ref 4.3–11.1)

## 2019-08-04 LAB — SODIUM
Sodium: 121 mmol/L — ABNORMAL LOW (ref 136–145)
Sodium: 121 mmol/L — ABNORMAL LOW (ref 136–145)
Sodium: 123 mmol/L — ABNORMAL LOW (ref 136–145)
Sodium: 123 mmol/L — ABNORMAL LOW (ref 136–145)
Sodium: 128 mmol/L — ABNORMAL LOW (ref 136–145)
Sodium: 128 mmol/L — ABNORMAL LOW (ref 136–145)
Sodium: 129 mmol/L — ABNORMAL LOW (ref 136–145)
Sodium: 129 mmol/L — ABNORMAL LOW (ref 136–145)

## 2019-08-04 LAB — CBC W/O DIFF
ABSOLUTE NRBC: 0 10*3/uL (ref 0.0–0.2)
HCT: 22.5 % — ABNORMAL LOW (ref 35.8–46.3)
HGB: 7.9 g/dL — ABNORMAL LOW (ref 11.7–15.4)
MCH: 29.9 PG (ref 26.1–32.9)
MCHC: 35.1 g/dL — ABNORMAL HIGH (ref 31.4–35.0)
MCV: 85.2 FL (ref 79.6–97.8)
MPV: 10.3 FL (ref 9.4–12.3)
PLATELET: 195 10*3/uL (ref 150–450)
RBC: 2.64 M/uL — ABNORMAL LOW (ref 4.05–5.2)
RDW: 12.9 % (ref 11.9–14.6)
WBC: 10.7 10*3/uL (ref 4.3–11.1)

## 2019-08-04 LAB — METABOLIC PANEL, BASIC
Anion gap: 6 mmol/L — ABNORMAL LOW (ref 7–16)
BUN: 12 MG/DL (ref 8–23)
CO2: 26 mmol/L (ref 21–32)
Calcium: 7.8 MG/DL — ABNORMAL LOW (ref 8.3–10.4)
Chloride: 94 mmol/L — ABNORMAL LOW (ref 98–107)
Creatinine: 0.58 MG/DL — ABNORMAL LOW (ref 0.6–1.0)
GFR est AA: >60 mL/min/{1.73_m2} (ref >60)
GFR est non-AA: >60 mL/min/{1.73_m2} (ref >60)
Glucose: 109 mg/dL — ABNORMAL HIGH (ref 65–100)
Potassium: 4.2 mmol/L (ref 3.5–5.1)
Sodium: 126 mmol/L — ABNORMAL LOW (ref 136–145)

## 2019-08-04 MED ORDER — METOPROLOL TARTRATE 5 MG/5 ML IV SOLN
5 mg/ mL | INTRAVENOUS | Status: AC
Start: 2019-08-04 — End: 2019-08-04
  Administered 2019-08-04: 16:00:00 via INTRAVENOUS

## 2019-08-04 MED ORDER — QUETIAPINE 25 MG TAB
25 mg | Freq: Every evening | ORAL | Status: DC
Start: 2019-08-04 — End: 2019-08-06
  Administered 2019-08-05 – 2019-08-06 (×3): via ORAL

## 2019-08-04 MED ORDER — SODIUM CHLORIDE 0.9 % IV PIGGY BACK
100 mg | INTRAVENOUS | Status: DC
Start: 2019-08-04 — End: 2019-08-07
  Administered 2019-08-04 (×9): via INTRAVENOUS

## 2019-08-04 MED ORDER — SODIUM CHLORIDE 0.9 % IV
Freq: Once | INTRAVENOUS | Status: AC
Start: 2019-08-04 — End: 2019-08-04
  Administered 2019-08-04: 07:00:00 via INTRAVENOUS

## 2019-08-04 MED ORDER — ADENOSINE 3 MG/ML IV SOLN
3 mg/mL | INTRAVENOUS | Status: DC
Start: 2019-08-04 — End: 2019-08-04

## 2019-08-04 MED ORDER — QUETIAPINE 25 MG TAB
25 mg | Freq: Once | ORAL | Status: AC
Start: 2019-08-04 — End: 2019-08-04
  Administered 2019-08-04: 09:00:00 via ORAL

## 2019-08-04 MED ORDER — APIXABAN 2.5 MG TABLET
2.5 mg | Freq: Two times a day (BID) | ORAL | Status: DC
Start: 2019-08-04 — End: 2019-08-07
  Administered 2019-08-05 – 2019-08-07 (×5): via ORAL

## 2019-08-04 MED ORDER — DILTIAZEM HCL 5 MG/ML IV SOLN
5 mg/mL | Freq: Once | INTRAVENOUS | Status: AC
Start: 2019-08-04 — End: 2019-08-04
  Administered 2019-08-04: 07:00:00 via INTRAVENOUS

## 2019-08-04 MED ORDER — METOPROLOL TARTRATE 5 MG/5 ML IV SOLN
5 mg/ mL | Freq: Once | INTRAVENOUS | Status: AC
Start: 2019-08-04 — End: 2019-08-04
  Administered 2019-08-04: 16:00:00 via INTRAVENOUS

## 2019-08-04 MED FILL — DILTIAZEM HCL 5 MG/ML IV SOLN: 5 mg/mL | INTRAVENOUS | Qty: 5

## 2019-08-04 MED FILL — TROSPIUM 20 MG TAB: 20 mg | ORAL | Qty: 1

## 2019-08-04 MED FILL — SODIUM CHLORIDE 0.9 % IV: INTRAVENOUS | Qty: 500

## 2019-08-04 MED FILL — METOPROLOL TARTRATE 25 MG TAB: 25 mg | ORAL | Qty: 2

## 2019-08-04 MED FILL — ATORVASTATIN 10 MG TAB: 10 mg | ORAL | Qty: 1

## 2019-08-04 MED FILL — ASPIRIN 81 MG CHEWABLE TAB: 81 mg | ORAL | Qty: 1

## 2019-08-04 MED FILL — OXYCODONE 5 MG TAB: 5 mg | ORAL | Qty: 1

## 2019-08-04 MED FILL — METOPROLOL TARTRATE 5 MG/5 ML IV SOLN: 5 mg/ mL | INTRAVENOUS | Qty: 5

## 2019-08-04 MED FILL — QUETIAPINE 25 MG TAB: 25 mg | ORAL | Qty: 1

## 2019-08-04 MED FILL — FAMOTIDINE 20 MG TAB: 20 mg | ORAL | Qty: 1

## 2019-08-04 MED FILL — ELIQUIS 2.5 MG TABLET: 2.5 mg | ORAL | Qty: 1

## 2019-08-04 MED FILL — VALSARTAN 320 MG TAB: 320 mg | ORAL | Qty: 1

## 2019-08-04 MED FILL — PROMETHAZINE 25 MG/ML INJECTION: 25 mg/mL | INTRAMUSCULAR | Qty: 1

## 2019-08-04 MED FILL — DILTIAZEM 100 MG IV POWDER FOR SOLUTION: 100 mg | INTRAVENOUS | Qty: 100

## 2019-08-04 MED FILL — HYDROMORPHONE (PF) 1 MG/ML IJ SOLN: 1 mg/mL | INTRAMUSCULAR | Qty: 1

## 2019-08-04 MED FILL — SUCRALFATE 1 GRAM TAB: 1 gram | ORAL | Qty: 1

## 2019-08-04 MED FILL — ADENOSINE 3 MG/ML IV SOLN: 3 mg/mL | INTRAVENOUS | Qty: 6

## 2019-08-04 NOTE — Progress Notes (Signed)
Patient awakened and began pulling at lines and Oxygen tubing. Gently attempted to reorient patient and explain reason for oxygen, IV, and monitoring cords. Without oxygen on patient O2 sat 74%. Patient becoming increasingly agitated and combative. Patient is not oriented to place, time, or situation. Patient began yelling attempting to bite and scratch, pinched this RN several times, and attempted to smack/punch this RN as well. Patient placed in bilateral mittens to prevent self harm or harm to nursing staff. Explained to patient necessity of mitts. Patient continued to become more agitated, and HR elevated to 200. Cardizem gtt increased from 2.5 to 5 per titration orders. Dr Jens Som called to bedside. Received verbal order for 5mg  IVP of lopressor. Patient HR now 114. Will cont to monitor.

## 2019-08-04 NOTE — Progress Notes (Signed)
0218 pt changed, turned, peri-care performed. pt flipped into afib with rates maintaining 120-140s, with spikes up to 170s. BP symptomatic 86/52(77) down from 145/88. Dr. Mitzi Hansen paged and orders received,.

## 2019-08-04 NOTE — Progress Notes (Signed)
Care Management Interventions  PCP Verified by CM: Yes  Last Visit to PCP: 07/10/19  Current Support Network: Lives Alone  Discharge Location  Discharge Placement: Unable to determine at this time     Saw pt in interdisciplinarily rounds with the following disciplines: Physician, Case Management, Nursing, Dietary,Therapy and Pharmacy. The plan of care was discussed along with goals for discharge date/ location.       SW spoke w/ pt. Pt is here w/ Acute hyponatremia. Pt confirmed demographic info. Pt lives alone and her daughter comes weekly to help wash clothes and do other house chores. Pt usually bathes and dresses herself. Pt uses a rotary walker and can daily to get around. Pt's house does not have steps.   Per Dr.  Rock Nephew was confused at home and last night went into Afib    Pt's PCP is Dr. Ezzard Standing and last seen in July. Pt's pharmacy of choice is [facility] in [place]    Lanier Clam, MSW

## 2019-08-04 NOTE — Progress Notes (Signed)
Patient alert and oriented to person only. Follow simple commands. Respiration even and unlabored. Oxygen saturation 97% on 1 liter nasal cannula. Head of bed elevated. Left hip dressing C/D/I. Positive sensation. Bed in low/lock position. Bed alarm on.

## 2019-08-04 NOTE — Progress Notes (Signed)
Bedside and Verbal shift change report given to Gavin Pound, Charity fundraiser  (oncoming nurse) by Leanor Kail, RN  (offgoing nurse). Report included the following information SBAR, Kardex, ED Summary, Procedure Summary, Intake/Output, Recent Results, Cardiac Rhythm in and out of AFIB/SR  and Alarm Parameters .

## 2019-08-04 NOTE — Progress Notes (Signed)
Problem: Pressure Injury - Risk of  Goal: *Prevention of pressure injury  Description: Document Braden Scale and appropriate interventions in the flowsheet.  Outcome: Progressing Towards Goal  Note: Pressure Injury Interventions:  Sensory Interventions: Assess changes in LOC, Avoid rigorous massage over bony prominences, Check visual cues for pain, Discuss PT/OT consult with provider, Float heels, Keep linens dry and wrinkle-free, Maintain/enhance activity level, Minimize linen layers, Monitor skin under medical devices, Turn and reposition approx. every two hours (pillows and wedges if needed)    Moisture Interventions: Absorbent underpads, Apply protective barrier, creams and emollients, Check for incontinence Q2 hours and as needed, Contain wound drainage, Internal/External urinary devices, Maintain skin hydration (lotion/cream), Minimize layers, Moisture barrier    Activity Interventions: Increase time out of bed, Pressure redistribution bed/mattress(bed type), PT/OT evaluation    Mobility Interventions: Turn and reposition approx. every two hours(pillow and wedges), Float heels, HOB 30 degrees or less, Pressure redistribution bed/mattress (bed type), PT/OT evaluation    Nutrition Interventions: Document food/fluid/supplement intake    Friction and Shear Interventions: Apply protective barrier, creams and emollients, Foam dressings/transparent film/skin sealants, HOB 30 degrees or less, Minimize layers                Problem: Patient Education: Go to Patient Education Activity  Goal: Patient/Family Education  Outcome: Progressing Towards Goal     Problem: Falls - Risk of  Goal: *Absence of Falls  Description: Document Schmid Fall Risk and appropriate interventions in the flowsheet.  Outcome: Progressing Towards Goal  Note: Fall Risk Interventions:       Mentation Interventions: Adequate sleep, hydration, pain control, Bed/chair exit alarm, Door open when patient unattended, Evaluate medications/consider  consulting pharmacy, Eyeglasses and hearing aids, Increase mobility, More frequent rounding, Reorient patient, Room close to nurse's station, Toileting rounds, Update white board    Medication Interventions: Assess postural VS orthostatic hypotension, Bed/chair exit alarm, Evaluate medications/consider consulting pharmacy, Patient to call before getting OOB, Teach patient to arise slowly    Elimination Interventions: Bed/chair exit alarm, Call light in reach, Patient to call for help with toileting needs, Stay With Me (per policy), Toilet paper/wipes in reach    History of Falls Interventions: Bed/chair exit alarm, Consult care management for discharge planning, Door open when patient unattended, Evaluate medications/consider consulting pharmacy, Investigate reason for fall, Room close to nurse's station, Utilize gait belt for transfer/ambulation, Assess for delayed presentation/identification of injury for 48 hrs (comment for end date), Vital signs minimum Q4HRs X 24 hrs (comment for end date)         Problem: Patient Education: Go to Patient Education Activity  Goal: Patient/Family Education  Outcome: Progressing Towards Goal     Problem: Delirium Treatment  Goal: *Level of consciousness restored to baseline  Outcome: Progressing Towards Goal  Goal: *Level of environmental perceptions restored to baseline  Outcome: Progressing Towards Goal  Goal: *Sensory perception restored to baseline  Outcome: Progressing Towards Goal  Goal: *Emotional stability restored to baseline  Outcome: Progressing Towards Goal  Goal: *Functional assessment restored to baseline  Outcome: Progressing Towards Goal  Goal: *Absence of falls  Outcome: Progressing Towards Goal  Goal: *Will remain free of delirium, CAM Score negative  Outcome: Progressing Towards Goal  Goal: *Cognitive status will be restored to baseline  Outcome: Progressing Towards Goal  Goal: Interventions  Outcome: Progressing Towards Goal     Problem: Patient Education: Go  to Patient Education Activity  Goal: Patient/Family Education  Outcome: Progressing Towards Goal

## 2019-08-04 NOTE — Progress Notes (Signed)
Critical Care Interdisciplinary Rounds with staff.    Cindy Bishop, M.Div.  Chaplain

## 2019-08-04 NOTE — Progress Notes (Signed)
PT/OT note - eval on hold this am per MD.  Sherron Monday with RN this afternoon-pt. Confused with saturated bandage.  Will try tomorrow.

## 2019-08-04 NOTE — Progress Notes (Signed)
Pt responded to cardizem bolus, but BP dropped. Dr. Mitzi Hansen to bedside, said hold cardizem gtt for now, and give bolus. Will continue to monitor.

## 2019-08-04 NOTE — Progress Notes (Signed)
Progress Notes by Cammy Copa, DO at 08/04/19 0981                Author: Cammy Copa, DO  Service: Internal Medicine  Author Type: Physician       Filed: 08/04/19 1407  Date of Service: 08/04/19 0755  Status: Signed          Editor: Cammy Copa, DO (Physician)                         [facility] Hospitalist Progress Note          Patient: Brandy Vargas  MRN: [id number]   SSN: [id number]          Date of Birth: January 17, 1940   Age: 79 y.o.   Sex: female         Admit Date: 08/03/2019     LOS: 1 day         Subjective:        Chief Complaint: Confusion   Reason for Admission: Acute hyponatremia      79 year old CF with a PMH of PAF, HTN, GERD, and h/o breast CA who presented to the ER with 3-4 days of nausea and vomiting and 24 hours of waxing and waning confusion  and hallucinating. She had a left THA on 07/30/19 and did well with the surgery. Per the son, the patient was nauseated and had some vomiting post-op. She continued to have this, but was discharged on 08/01/19. The patient went home and continued to have  vomiting and was unable to hold anything down. Then on the evening of 08/02/19 she became confused and was seeing ants on her walls. Her son got her comfortable in the chair and she fell asleep. On the morning of 08/03/19 he found the patient in the floor  so he called EMS.      8/3 - She became acutely confused overnight. Got medications and is snoring currently. Hard to arouse. Pupil equal.      Review of systems negative except stated above.        Assessment:        Primary Diagnosis: Acute hyponatremia         Hospital Problems  as of 08/04/2019  Date Reviewed:  2019-08-07                     Codes  Class  Noted - Resolved  POA              * (Principal) Acute hyponatremia  ICD-10-CM: E87.1   ICD-9-CM: 276.1    08/03/2019 - Present  Yes                        ABLA (acute blood loss anemia)  ICD-10-CM: D62   ICD-9-CM: 285.1    08/03/2019 - Present  Yes                        Acute metabolic  encephalopathy  ICD-10-CM: G93.41   ICD-9-CM: 348.31    08/03/2019 - Present  Yes                        Nausea and vomiting  ICD-10-CM: R11.2   ICD-9-CM: 787.01    08/03/2019 - Present  Yes  Leukocytosis  ICD-10-CM: D72.829   ICD-9-CM: 288.60    08/03/2019 - Present  Yes                        History of total hip arthroplasty, left  ICD-10-CM: Y86.578Z96.642   ICD-9-CM: V43.64    08/03/2019 - Present  Yes                        Essential hypertension  ICD-10-CM: I10   ICD-9-CM: 401.9    04/28/2019 - Present  Yes                        Paroxysmal atrial fibrillation (HCC)  ICD-10-CM: I48.0   ICD-9-CM: 427.31    04/28/2019 - Present  Yes                        Pure hyperglyceridemia  ICD-10-CM: E78.1   ICD-9-CM: 272.1    04/28/2019 - Present  Yes                            Active Medical Issues and Plan (MDM):        Principal Problem:     Acute hyponatremia (08/03/2019)   - Sodium 138 (07/14/19) --> 117 (08/03/19)   - Currently 117 --> 126 x 24 hours   - Likely due to vomiting and dehydration   - Decrease normal saline @ 100 ml/hr to 7450ml/hr   - UA with ketones, otherwise normal   - Urine sodium 17 + osm 236   - Serum osm 246   - TFTs unremarkable   - Triglycerides 80   - Trend sodium Q6H   - Strict I/O   ??   Active Problems:     Atrial Fibrillation with RVR (08/04/2019)   - The patient flipped into Afib RVR   - HR better controlled on Cardizem gtt, but mildly hypotensive   - Continue Lopressor 50mg  BID   - Continue Eliquis 2.5 BID   - Give Lopressor IV 5mg  PRN if BP permits        ABLA (acute blood loss anemia) (08/03/2019)   - Likely due to left THA   - Hgb 7.4 at discharge --> admit 6.6 --> 1U PRBC --> 7.9   - Transfuse Hgb <7.0   ??     Acute metabolic encephalopathy (08/03/2019)   - Worse overnight   - Now obtunded from medications   - Increase Seroquel to 100mg  QHS   - Likely due to hyponatremia + delirium   - UA unremarkable   - TFTs unremarkable   ??     Nausea and vomiting (08/03/2019)   - Resolved   - Unsure  etiology --> has been happening since post-op per son   - No abdominal pain   - Start Zofran PRN   - Start Phenergan PRN   ??     Leukocytosis (08/03/2019)   - I suspect this is reactive   - Resolved   - Decrease normal saline   - UA unremarkable   ??     Essential hypertension (04/28/2019)   - Stable   - Continue Lopressor   - Continue ARB   ??     History of total hip arthroplasty, 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)   - Incision with bloody drainage   - PT/OT   - Pain  control   ??     Paroxysmal atrial fibrillation (HCC) (04/28/2019)   - Currently NSR   - Continue Lopressor   - Continue low dose Eliquis      Otherwise all chronic medical issues appear stable with no changes.      Diet: ADULT DIET Regular   VTE ppx: Eliquis        Objective:        Visit Vitals      BP  (!) 95/38     Pulse  80     Temp  97.4 ??F (36.3 ??C)     Resp  12     Ht  5\' 3"  (1.6 m)     Wt  85.5 kg (188 lb 7.9 oz)     SpO2  100%        BMI  33.39 kg/m??         Oxygen Therapy   O2 Sat (%): 100 % (08/04/19 0707)   Pulse via Oximetry: 92 beats per minute (08/04/19 0707)   O2 Device: None (Room air) (08/04/19 0400)         Intake and Output:       Intake/Output Summary (Last 24 hours) at 08/04/2019 0756   Last data filed at 08/04/2019 0230     Gross per 24 hour        Intake  1095.3 ml        Output  1520 ml        Net  -424.7 ml              Physical Exam:    GENERAL: Sleepy, no distress, appears stated age   EYE: conjunctivae/corneas clear. PERRL.   THROAT & NECK: normal and no erythema or exudates noted.    LUNG: clear to auscultation bilaterally   HEART: tachy, irregular rate and rhythm, S1S2, no murmur, no JVD   ABDOMEN: soft, non-tender, non-distended. Bowel sounds normal.    EXTREMITIES:  No edema, 2+ pedal/radial pulses bilaterally   SKIN: no rash or abnormalities   NEUROLOGIC: Sleepy. Cranial nerves 2-12 grossly intact.      Lab/Data Review:     Recent Results (from the past 24 hour(s))     RBC, ALLOCATE          Collection Time: 08/03/19  8:00 AM         Result   Value  Ref Range            HISTORY CHECKED?  Historical check performed         TYPE & SCREEN          Collection Time: 08/03/19  8:02 AM         Result  Value  Ref Range            Crossmatch Expiration  08/06/2019,2359         ABO/Rh(D)  B POSITIVE         Antibody screen  NEG         Unit number  10/06/2019         Blood component type  RC LR         Unit division  00         Status of unit  TRANSFUSED         Crossmatch result  Compatible         Unit number  [id number]         Blood component type  RC  LR         Unit division  00         Status of unit  ALLOCATED         Crossmatch result  Compatible         RBC, ALLOCATE          Collection Time: 08/03/19 10:00 AM         Result  Value  Ref Range            HISTORY CHECKED?  Historical check performed         OSMOLALITY, UR          Collection Time: 08/03/19  1:07 PM         Result  Value  Ref Range            Osmolality,urine  236  50 - 1,400 MOSM/kg H2O       SODIUM, UR, RANDOM          Collection Time: 08/03/19  1:07 PM         Result  Value  Ref Range            Sodium,urine random  17  MMOL/L       URINALYSIS W/ RFLX MICROSCOPIC          Collection Time: 08/03/19  1:07 PM         Result  Value  Ref Range            Color  YELLOW          Appearance  CLEAR          Specific gravity  1.008  1.001 - 1.023         pH (UA)  6.0  5.0 - 9.0         Protein  Negative  NEG mg/dL       Glucose  Negative  mg/dL       Ketone  15 (A)  NEG mg/dL       Bilirubin  Negative  NEG         Blood  Negative  NEG         Urobilinogen  0.2  0.2 - 1.0 EU/dL       Nitrites  Negative  NEG         Leukocyte Esterase  Negative  NEG         T4, FREE          Collection Time: 08/03/19  2:03 PM         Result  Value  Ref Range            T4, Free  1.0  0.78 - 1.46 NG/DL       TSH 3RD GENERATION          Collection Time: 08/03/19  2:03 PM         Result  Value  Ref Range            TSH  1.100  uIU/mL       TRIGLYCERIDE          Collection Time: 08/03/19  2:03 PM         Result   Value  Ref Range            Triglyceride  80  35 - 150 MG/DL       OSMOLALITY, SERUM/PLASMA          Collection Time: 08/03/19  2:03 PM  Result  Value  Ref Range            Osmolality, serum/plasma  246 (L)  280 - 301 MOSM/kg H2O       SODIUM          Collection Time: 08/03/19  2:03 PM         Result  Value  Ref Range            Sodium  117 (LL)  136 - 145 mmol/L       HGB & HCT          Collection Time: 08/03/19  2:14 PM         Result  Value  Ref Range            HGB  8.0 (L)  11.7 - 15.4 g/dL       HCT  19.1 (L)  66.0 - 46.3 %       SODIUM          Collection Time: 08/03/19  4:56 PM         Result  Value  Ref Range            Sodium  120 (L)  136 - 145 mmol/L       SODIUM          Collection Time: 08/03/19  8:39 PM         Result  Value  Ref Range            Sodium  121 (L)  136 - 145 mmol/L       SODIUM          Collection Time: 08/03/19 11:46 PM         Result  Value  Ref Range            Sodium  123 (L)  136 - 145 mmol/L       METABOLIC PANEL, BASIC          Collection Time: 08/04/19  3:39 AM         Result  Value  Ref Range            Sodium  126 (L)  136 - 145 mmol/L       Potassium  4.2  3.5 - 5.1 mmol/L       Chloride  94 (L)  98 - 107 mmol/L       CO2  26  21 - 32 mmol/L       Anion gap  6 (L)  7 - 16 mmol/L       Glucose  109 (H)  65 - 100 mg/dL       BUN  12  8 - 23 MG/DL       Creatinine  6.00 (L)  0.6 - 1.0 MG/DL       GFR est AA  >45  >60 ml/min/1.58m2       GFR est non-AA  >60  >60 ml/min/1.93m2       Calcium  7.8 (L)  8.3 - 10.4 MG/DL       CBC W/O DIFF          Collection Time: 08/04/19  3:39 AM         Result  Value  Ref Range            WBC  10.7  4.3 - 11.1 K/uL       RBC  2.64 (L)  4.05 - 5.2 M/uL  HGB  7.9 (L)  11.7 - 15.4 g/dL       HCT  13.0 (L)  86.5 - 46.3 %       MCV  85.2  79.6 - 97.8 FL       MCH  29.9  26.1 - 32.9 PG       MCHC  35.1 (H)  31.4 - 35.0 g/dL       RDW  78.4  69.6 - 14.6 %       PLATELET  195  150 - 450 K/uL       MPV  10.3  9.4 - 12.3 FL            ABSOLUTE  NRBC  0.00  0.0 - 0.2 K/uL           SARS-CoV-2 Lab Results     "Novel Coronavirus" Test: No results found for: COV2NT     "Emergent Disease" Test: No results found for: EDPR   "SARS-COV-2" Test: No results found for: XGCOVT   "Precision Labs" Test: No results found for: RSLT   Rapid Test: No results found for: COVR            Imaging:   XR PELV AP ONLY      Result Date: 07/30/2019   EXAMINATION: XR PELV AP ONLY 07/30/2019 12:36 PM ACCESSION NUMBER: [id number] INDICATION: Post op total hip COMPARISON: Left hip x-rays 04/22/2019 TECHNIQUE: A single AP supine view of the pelvis was obtained. FINDINGS: Immediate postoperative changes status  post left hip arthroplasty, including subcutaneous emphysema and soft tissue swelling. Unremarkable immediate postoperative appearance of the hardware. 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 Pubic symphysis degenerative  change.       Unremarkable immediate postoperative appearance of a left hip total arthroplasty. VOICE DICTATED BY: Dr. Caryl Comes      XR ABD ACUTE W 1 V CHEST      Result Date: 08/03/2019   EXAM: Acute abdominal series INDICATION: abd pain. COMPARISON: None available TECHNIQUE: A single view of the chest and 2 views of the abdomen were obtained. FINDINGS: The bowel gas pattern is nonobstructive. No intraperitoneal free air is evident. Moderate  retained fecal burden in the right colon. The lungs are clear and the pulmonary vasculature is normal. No pneumothorax or pleural effusion. The cardiomediastinal silhouette is within normal limits. Left hip arthroplasty is unremarkable.       1.  No acute radiographic abnormality of the chest or abdomen. 2.  Moderate retained fecal burden in the right colon.      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  Standard views with 3-D tomosynthesis Spot CC and MLO views The focal asymmetry likely effaces on compression magnification CC, MLO, and ML views a total symphysis cc and MLO compression magnification views were then  obtained where there is normal superimposed fibroglandular tissue at the area of asymmetry. No concerning mass or calcifications.       Benign left mammogram Recommend screening mammography in one year. A reminder letter will be scheduled.  This was reported to the patient at the time of interpretation by the techologist. BI-RADS Assessment Category 2: Benign finding  No results found for this visit on 08/03/19.      Cultures:     All Micro Results           None                    Discharge Plan:        TBD         Signed By:  Cammy Copa, DO           August 04, 2019

## 2019-08-04 NOTE — Progress Notes (Signed)
 Comprehensive Nutrition Assessment    Type and Reason for Visit: Initial, Positive nutrition screen  Best Practice Alert for Malnutrition Screening Tool: Recently Lost Weight Without Trying: No, If Yes, How Much Weight Loss: Unsure, Eating Poorly Due to Decreased Appetite: Yes    Nutrition Recommendations/Plan:   . Ensure clear, Ensure Enlive and magic-each once per day     Malnutrition Assessment:  Malnutrition Status: At risk for malnutrition (specify) (N/V since 7/29)    Nutrition Assessment:   Nutrition History: 8/3: Pt confused and unable to provide reliable weight or diet hx. PO intake likely limited since 7/29 d/t N/V post-op.      Nutrition Background: H/O: PAF, HTN, GERD, breast CA , left THA 07/30/2019-had PONV, discharged 7/31. Presented with  3-4 days of nausea and vomiting and 24 hours of waxing and waning confusion and hallucinating., pt found in the floor at home. Admitted with hyponatremia, N/V, ABLA, and acute metabolic encephalopathy  Daily Update:  Pt confused but denies and N/V and reports she ate a bowl of oatmeal and some fruit for breakfast this AM. RN reports pt was too lethargic for breakfast and consumed nothing. RN reports pt is just now less lethargic and lunch tray was just ordered.  Dose of prn phenergan administered at 0500 and has active order for carafate tid but has not taken any doses today.  Nutrition Related Findings:   NFPE-no fat or muscle wasting Wound Type: Surgical incision (TKA 7/29)    Current Nutrition Therapies:  ADULT DIET Regular    Current Intake:   Average Meal Intake: 0% Average Supplement Intake: None ordered      Anthropometric Measures:  Height: 5' 3 (160 cm)  Current Body Wt: 85.5 kg (188 lb 7.9 oz) (8/3), Weight source: Bed scale  BMI: 33.4, Obese class 1 (BMI 30.0-34.9)  Admission Body Weight: 169 lb 15.6 oz (source not specified)  Ideal Body Weight (lbs) (Calculated): 115 lbs (52 kg),    Usual Body Wt: 77.6 kg (171 lb) (per EMR within past 7 months), Percent  weight change: 10.2          Edema: No data recorded   Estimated Daily Nutrient Needs:  Energy (kcal/day): 1243-1555 ( (12-15), Weight Used: Usual (77.7 kg))  Protein (g/day): 62-78 Weight Used: ( (20% of kcal))  Fluid (ml/day):   (1 ml/kcal)    Nutrition Diagnosis:    Inadequate oral intake related to cognitive or neurological impairment, altered GI function (confusion, lethargy and N/V) as evidenced by  (RN report of no breakfast intake d/t pt too lethargic for po, hx of N/V since 7/29)    Nutrition Interventions:   Food and/or Nutrient Delivery: Continue current diet, Start oral nutrition supplement     Coordination of Nutrition Care: Continue to monitor while inpatient  Plan of Care discussed with Suzann Clarity, RN    Goals:      Active Goal: Intake >50% of needs by follow up    Nutrition Monitoring and Evaluation:      Food/Nutrient Intake Outcomes: Food and nutrient intake, Supplement intake       Discharge Planning:    Too soon to determine    Greig Hummer, RD, LD, CNSC [phone]

## 2019-08-04 NOTE — Progress Notes (Signed)
Patient O2 saturation while sleeping 76%. Placed patient on 3L per NC and O2 sat now 96%. Will cont to monitor.

## 2019-08-04 NOTE — Progress Notes (Signed)
Bedside and Verbal shift change report given to Carina, Runner, broadcasting/film/video) by Maxine Glenn, Statistician). Report included the following information SBAR, Kardex, ED Summary, Procedure Summary, Intake/Output, Recent Results, Cardiac Rhythm AFIB and Alarm Parameters .

## 2019-08-05 LAB — BASIC METABOLIC PANEL
Anion Gap: 4 mmol/L — ABNORMAL LOW (ref 7–16)
BUN: 12 MG/DL (ref 8–23)
CO2: 27 mmol/L (ref 21–32)
Calcium: 7.7 MG/DL — ABNORMAL LOW (ref 8.3–10.4)
Chloride: 100 mmol/L (ref 98–107)
Creatinine: 0.57 MG/DL — ABNORMAL LOW (ref 0.6–1.0)
EGFR IF NonAfrican American: >60 mL/min/{1.73_m2} (ref >60)
GFR African American: >60 mL/min/{1.73_m2} (ref >60)
Glucose: 125 mg/dL — ABNORMAL HIGH (ref 65–100)
Potassium: 4.2 mmol/L (ref 3.5–5.1)
Sodium: 131 mmol/L — ABNORMAL LOW (ref 136–145)

## 2019-08-05 LAB — CBC
Hematocrit: 21.2 % — ABNORMAL LOW (ref 35.8–46.3)
Hemoglobin: 7.4 g/dL — ABNORMAL LOW (ref 11.7–15.4)
MCH: 30.6 PG (ref 26.1–32.9)
MCHC: 34.9 g/dL (ref 31.4–35.0)
MCV: 87.6 FL (ref 79.6–97.8)
MPV: 10 FL (ref 9.4–12.3)
NRBC Absolute: 0.02 10*3/uL (ref 0.0–0.2)
Platelets: 182 10*3/uL (ref 150–450)
RBC: 2.42 M/uL — ABNORMAL LOW (ref 4.05–5.2)
RDW: 13.3 % (ref 11.9–14.6)
WBC: 8.5 10*3/uL (ref 4.3–11.1)

## 2019-08-05 LAB — SODIUM
Sodium: 130 mmol/L — ABNORMAL LOW (ref 136–145)
Sodium: 130 mmol/L — ABNORMAL LOW (ref 136–145)

## 2019-08-05 LAB — METABOLIC PANEL, BASIC
Anion gap: 4 mmol/L — ABNORMAL LOW (ref 7–16)
BUN: 12 MG/DL (ref 8–23)
CO2: 27 mmol/L (ref 21–32)
Calcium: 7.7 MG/DL — ABNORMAL LOW (ref 8.3–10.4)
Chloride: 100 mmol/L (ref 98–107)
Creatinine: 0.57 MG/DL — ABNORMAL LOW (ref 0.6–1.0)
GFR est AA: >60 mL/min/{1.73_m2} (ref >60)
GFR est non-AA: >60 mL/min/{1.73_m2} (ref >60)
Glucose: 125 mg/dL — ABNORMAL HIGH (ref 65–100)
Potassium: 4.2 mmol/L (ref 3.5–5.1)
Sodium: 131 mmol/L — ABNORMAL LOW (ref 136–145)

## 2019-08-05 LAB — CBC W/O DIFF
ABSOLUTE NRBC: 0.02 10*3/uL (ref 0.0–0.2)
HCT: 21.2 % — ABNORMAL LOW (ref 35.8–46.3)
HGB: 7.4 g/dL — ABNORMAL LOW (ref 11.7–15.4)
MCH: 30.6 PG (ref 26.1–32.9)
MCHC: 34.9 g/dL (ref 31.4–35.0)
MCV: 87.6 FL (ref 79.6–97.8)
MPV: 10 FL (ref 9.4–12.3)
PLATELET: 182 10*3/uL (ref 150–450)
RBC: 2.42 M/uL — ABNORMAL LOW (ref 4.05–5.2)
RDW: 13.3 % (ref 11.9–14.6)
WBC: 8.5 10*3/uL (ref 4.3–11.1)

## 2019-08-05 LAB — PLEASE READ & DOCUMENT PPD TEST IN 24 HRS
PPD: NEGATIVE Negative
mm Induration: 0 mm (ref 0–5)

## 2019-08-05 LAB — PLEASE READ & DOCUMENT PPD TEST IN 48 HRS
PPD: NEGATIVE Negative
mm Induration: 0 mm (ref 0–5)

## 2019-08-05 MED FILL — SUCRALFATE 1 GRAM TAB: 1 gram | ORAL | Qty: 1

## 2019-08-05 MED FILL — VALSARTAN 320 MG TAB: 320 mg | ORAL | Qty: 1

## 2019-08-05 MED FILL — ELIQUIS 2.5 MG TABLET: 2.5 mg | ORAL | Qty: 1

## 2019-08-05 MED FILL — QUETIAPINE 25 MG TAB: 25 mg | ORAL | Qty: 4

## 2019-08-05 MED FILL — ATORVASTATIN 10 MG TAB: 10 mg | ORAL | Qty: 1

## 2019-08-05 MED FILL — ASPIRIN 81 MG CHEWABLE TAB: 81 mg | ORAL | Qty: 1

## 2019-08-05 MED FILL — TROSPIUM 20 MG TAB: 20 mg | ORAL | Qty: 1

## 2019-08-05 MED FILL — FAMOTIDINE 20 MG TAB: 20 mg | ORAL | Qty: 1

## 2019-08-05 MED FILL — METOPROLOL TARTRATE 25 MG TAB: 25 mg | ORAL | Qty: 2

## 2019-08-05 MED FILL — OXYCODONE 5 MG TAB: 5 mg | ORAL | Qty: 1

## 2019-08-05 NOTE — Progress Notes (Signed)
Problem: Pressure Injury - Risk of  Goal: *Prevention of pressure injury  Description: Document Braden Scale and appropriate interventions in the flowsheet.  Outcome: Progressing Towards Goal  Note: Pressure Injury Interventions:  Sensory Interventions: Assess changes in LOC, Avoid rigorous massage over bony prominences, Check visual cues for pain, Discuss PT/OT consult with provider, Float heels, Keep linens dry and wrinkle-free, Maintain/enhance activity level, Minimize linen layers, Monitor skin under medical devices, Turn and reposition approx. every two hours (pillows and wedges if needed)    Moisture Interventions: Check for incontinence Q2 hours and as needed, Absorbent underpads    Activity Interventions: Increase time out of bed, Pressure redistribution bed/mattress(bed type)    Mobility Interventions: Pressure redistribution bed/mattress (bed type), PT/OT evaluation    Nutrition Interventions: Document food/fluid/supplement intake, Offer support with meals,snacks and hydration    Friction and Shear Interventions: Apply protective barrier, creams and emollients, Foam dressings/transparent film/skin sealants, HOB 30 degrees or less, Minimize layers                Problem: Falls - Risk of  Goal: *Absence of Falls  Description: Document Schmid Fall Risk and appropriate interventions in the flowsheet.  Outcome: Progressing Towards Goal  Note: Fall Risk Interventions:       Mentation Interventions: Bed/chair exit alarm, Door open when patient unattended, More frequent rounding, Reorient patient, Room close to nurse's station    Medication Interventions: Assess postural VS orthostatic hypotension, Patient to call before getting OOB, Teach patient to arise slowly, Bed/chair exit alarm    Elimination Interventions: Call light in reach, Patient to call for help with toileting needs, Toileting schedule/hourly rounds    History of Falls Interventions: Bed/chair exit alarm, Door open when patient unattended,  Investigate reason for fall, Room close to nurse's station

## 2019-08-05 NOTE — Progress Notes (Signed)
 Problem: Self Care Deficits Care Plan (Adult)  Goal: *Acute Goals and Plan of Care (Insert Text)  08/05/2019 1214 by Delores Grizzle B, OT  Outcome: Progressing Towards Goal  Note: 1. Patient will perform grooming with stand by assistance seated edge of bed.   2. Patient will perform Upper body dressing with stand by assistance seated edge of bed    3. Patient will perform bathing with moderate assistance seated edge of bed or on shower chair.   4. Patient will perform toilet transfers with moderate assistance bed to [facility].  5. Patient will participate in 30 + minutes of ADL/ therapeutic exercise/therapeutic activity with min rest breaks to increase activity tolerance for self care.  6. Patient will perform ADL functional mobility in room (bed to chair, bed to [facility]) with moderate assistance.     Goals to be achieved in 7 days.       OCCUPATIONAL THERAPY: Initial Assessment, Daily Note, and AM 08/05/2019  INPATIENT: OT Visit Days: 1  Payor: AARP MEDICARE COMPLETE / Plan: [facility] AARP MEDICARE COMPLETE / Product Type: Managed Care Medicare /      NAME/AGE/GENDER: Brandy Vargas is a 79 y.o. female   PRIMARY DIAGNOSIS:  Acute hyponatremia [E87.1]  ABLA (acute blood loss anemia) [D62]  Acute metabolic encephalopathy [G93.41] Acute hyponatremia Acute hyponatremia        ICD-10: Treatment Diagnosis:    Generalized Muscle Weakness (M62.81)  Other lack of cordination (R27.8)   Precautions/Allergies:     Omeprazole magnesium      ASSESSMENT:     Brandy Vargas presents with with above diagnosis. Pt was discharged on Saturday after left THA. Pt was seen in ICU with PT present. Noted upon entry drainage from left hip dressing, RN notified and assisted pt to roll for dressing change. Pt assisted with upper body sponge off and pt washed her face and assisted with gown change. Pt up to edge of bed and worked on standing and taking side steps. Pt is noted to have significant generalized weakness, decreased activity tolerance and mobility.  Pt also noted to have some confusion. Pt would benefit from OT while in the hospital to maximize safety and independence with self care and functional mobility. Pt would likely benefit from STR as she currently needs quick a bit of assistance.     This section established at most recent assessment   PROBLEM LIST (Impairments causing functional limitations):  Decreased Strength  Decreased ADL/Functional Activities  Decreased Transfer Abilities  Decreased Ambulation Ability/Technique  Decreased Balance  Increased Pain  Decreased Activity Tolerance  Increased Fatigue  Decreased Cognition   INTERVENTIONS PLANNED: (Benefits and precautions of occupational therapy have been discussed with the patient.)  Activities of daily living training  Adaptive equipment training  Balance training  Cognitive training  Therapeutic activity  Therapeutic exercise     TREATMENT PLAN: Frequency/Duration: Follow patient 3 times per week to address above goals.  Rehabilitation Potential For Stated Goals: Good     REHAB RECOMMENDATIONS (at time of discharge pending progress):    Placement:  It is my opinion, based on this patient's performance to date, that Brandy Vargas may benefit from intensive therapy at a [facility] F[facility] after discharge due to the functional deficits listed above that are likely to improve with skilled rehabilitation and concerns that he/she may be unsafe to be unsupervised at home due to confusion and amount of assistance required .  Equipment:   None at this time  OCCUPATIONAL PROFILE AND HISTORY:   History of Present Injury/Illness (Reason for Referral):  See H&P  Past Medical History/Comorbidities:   Brandy Vargas  has a past medical history of Arthritis, Atrial fibrillation (HCC), Breast cancer (HCC) (2015), COVID-19 vaccine series completed (04/08/2019), GERD (gastroesophageal reflux disease), Hypertension, and Overactive bladder. She also has no past medical history of Malignant hyperthermia due  to anesthesia or Pseudocholinesterase deficiency.  Brandy Vargas  has a past surgical history that includes hx appendectomy; hx tubal ligation (1976); pr breast surgery procedure unlisted (Right, 2015); hx knee arthroscopy; and hx breast lumpectomy (Right).  Social History/Living Environment:   Home Environment: Private residence  # Steps to Enter: 5  One/Two Story Residence: One story  Living Alone: Yes  Support Systems: Child(ren)  Patient Expects to be Discharged to:: Unknown  Current DME Used/Available at Home: Environmental consultant, rollator, Medical laboratory scientific officer, straight  Prior Level of Function/Work/Activity:  Independent with all activities of daily living to include driving.      Number of Personal Factors/Comorbidities that affect the Plan of Care: Brief history (0):  LOW COMPLEXITY   ASSESSMENT OF OCCUPATIONAL PERFORMANCE::   Activities of Daily Living:   Basic ADLs (From Assessment) Complex ADLs (From Assessment)   Feeding: Setup  Oral Facial Hygiene/Grooming: Minimum assistance  Bathing: Maximum assistance  Upper Body Dressing: Minimum assistance  Lower Body Dressing: Total assistance  Toileting: Total assistance     Grooming/Bathing/Dressing Activities of Daily Living     Cognitive Retraining  Safety/Judgement: Awareness of environment                       Bed/Mat Mobility  Supine to Sit: Maximum assistance  Sit to Supine: Total assistance  Sit to Stand: Moderate assistance  Stand to Sit: Minimum assistance  Scooting: Moderate assistance     Most Recent Physical Functioning:   Gross Assessment:                  Posture:     Balance:  Sitting: Impaired  Sitting - Static: Fair (occasional)  Sitting - Dynamic: Fair (occasional)  Standing: Pull to stand;With support Bed Mobility:  Supine to Sit: Maximum assistance  Sit to Supine: Total assistance  Scooting: Moderate assistance  Wheelchair Mobility:     Transfers:  Sit to Stand: Moderate assistance  Stand to Sit: Minimum assistance            Patient Vitals for the past 6 hrs:   BP SpO2  O2 Flow Rate (L/min) Pulse   08/05/19 0700 (!) 102/48 100 % -- 81   08/05/19 0749 (!) 101/47 96 % -- 83   08/05/19 0755 -- -- 0 l/min --   08/05/19 0800 (!) 101/47 97 % -- 79   08/05/19 0827 (!) 101/47 -- -- 89   08/05/19 0900 (!) 100/51 (!) 87 % -- 77   08/05/19 1000 (!) 94/49 98 % -- 72   08/05/19 1148 -- -- -- 73   08/05/19 1200 (!) 97/47 96 % -- 76       Mental Status  Neurologic State: Alert  Orientation Level: Oriented to person, Oriented to time, Disoriented to situation, Disoriented to place  Cognition: Follows commands  Perception: Appears intact  Perseveration: No perseveration noted  Safety/Judgement: Awareness of environment                          Physical Skills Involved:  Range of Motion  Balance  Activity  Tolerance  Pain (acute) Cognitive Skills Affected (resulting in the inability to perform in a timely and safe manner):  Executive Function  Short Term Recall  Sustained Attention Psychosocial Skills Affected:  Environmental Adaptation   Number of elements that affect the Plan of Care: 5+:  HIGH COMPLEXITY   CLINICAL DECISION MAKING:   [employer] AM-PACT "6 Clicks"   Daily Activity Inpatient Short Form  How much help from another person does the patient currently need... Total A Lot A Little None   1.  Putting on and taking off regular lower body clothing?   [x]  1   []  2   []  3   []  4   2.  Bathing (including washing, rinsing, drying)?   []  1   [x]  2   []  3   []  4   3.  Toileting, which includes using toilet, bedpan or urinal?   [x]  1   []  2   []  3   []  4   4.  Putting on and taking off regular upper body clothing?   []  1   [x]  2   []  3   []  4   5.  Taking care of personal grooming such as brushing teeth?   []  1   []  2   [x]  3   []  4   6.  Eating meals?   []  1   []  2   []  3   [x]  4    2007, Trustees of [address], under license to [place], [place]. All rights reserved      Score:  Initial: 13 Most Recent: X (Date: -- )    Interpretation of Tool:  Represents activities that are  increasingly more difficult (i.e. Bed mobility, Transfers, Gait).     Use of outcome tool(s) and clinical judgement create a POC that gives a: LOW COMPLEXITY         TREATMENT:   (In addition to Assessment/Re-Assessment sessions the following treatments were rendered)     Pre-treatment Symptoms/Complaints:    Pain: Initial:     3 Post Session:  3     Self Care: (25 min): Procedure(s) (per grid) utilized to improve and/or restore self-care/home management as related to dressing, bathing, and functional mobility . Required maximal verbal, manual, and   cueing to facilitate activities of daily living skills and compensatory activities.    Assessment    Braces/Orthotics/Lines/Etc:   IV  foley catheter  ICU monitor  O2 Device: None (Room air)  Treatment/Session Assessment:    Response to Treatment:  pt tolerated fair.   Interdisciplinary Collaboration:   Physical Therapist  Occupational Therapist  Registered Nurse  Social Worker  After treatment position/precautions:   Supine in bed  Bed/Chair-wheels locked  Bed in low position  Call light within reach  RN notified  Family at bedside   Compliance with Program/Exercises: Compliant all of the time, Will assess as treatment progresses.  Recommendations/Intent for next treatment session:  Next visit will focus on advancements to more challenging activities and reduction in assistance provided.  Total Treatment Duration:  OT Patient Time In/Time Out  Time In: 1045  Time Out: 1130     Reva KATHEE Daring, OT

## 2019-08-05 NOTE — Progress Notes (Signed)
 TRANSFER - OUT REPORT:    Verbal report given to Dulce, Charity fundraiser (name) on Brandy Vargas  being transferred to [address] (unit) for routine progression of care       Report consisted of patient's Situation, Background, Assessment and   Recommendations(SBAR).     Information from the following report(s) SBAR, Kardex, ED Summary, Procedure Summary, Intake/Output, Recent Results, Cardiac Rhythm SR and Alarm Parameters  was reviewed with the receiving nurse.    Lines:   Peripheral IV 08/03/19 Left Forearm (Active)   Site Assessment Clean, dry, & intact 08/05/19 0715   Phlebitis Assessment 0 08/05/19 0715   Infiltration Assessment 0 08/05/19 0715   Dressing Status Clean, dry, & intact 08/05/19 0715   Dressing Type Transparent;Tape 08/05/19 0715   Hub Color/Line Status Pink;Flushed 08/05/19 0715   Alcohol Cap Used No 08/05/19 0715        Opportunity for questions and clarification was provided.      Patient transported with:   [employer]

## 2019-08-05 NOTE — Progress Notes (Signed)
Progress Notes by Cammy Copa, DO at 08/05/19 3299                Author: Cammy Copa, DO  Service: Internal Medicine  Author Type: Physician       Filed: 08/05/19 1422  Date of Service: 08/05/19 0821  Status: Addendum          Editor: Cammy Copa, DO (Physician)          Related Notes: Original Note by Cammy Copa, DO (Physician) filed at 08/05/19 1421                         [employer] Hospitalist Progress Note          Patient: Brandy Vargas  MRN: [id number]   SSN: [id number]          Date of Birth: October 21, 1940   Age: 79 y.o.   Sex: female         Admit Date: 08/03/2019     LOS: 2 days         Subjective:        Chief Complaint: Confusion   Reason for Admission: Acute hyponatremia      79 year old CF with a PMH of PAF, HTN, GERD, and h/o breast CA who presented to the ER with 3-4 days of nausea and vomiting and 24 hours of waxing and waning confusion  and hallucinating. She had a left THA on 07/30/19 and did well with the surgery. Per the son, the patient was nauseated and had some vomiting post-op. She continued to have this, but was discharged on 08/01/19. The patient went home and continued to have  vomiting and was unable to hold anything down. Then on the evening of 08/02/19 she became confused and was seeing ants on her walls. Her son got her comfortable in the chair and she fell asleep. On the morning of 08/03/19 he found the patient in the floor  so he called EMS. She was admitted for sodium 117 and anemia. She was transfused 1U PRBC. She became acutely confused and has persisted.      8/4 - She continues to be confused this AM. Not agitated, but hallucinating and not sure what she's saying. Thinks she is at the "[facility]". Reaching for her non-existent keys on her table. Says she slept well (didn't sleep much last night  per nursing). Denies CP/SOB. Denies N/V/D.      Review of systems negative except stated above.        Assessment:        Primary Diagnosis:  Acute hyponatremia         Hospital Problems  as of 08/05/2019  Date Reviewed:  08-21-2019                     Codes  Class  Noted - Resolved  POA              * (Principal) Acute hyponatremia  ICD-10-CM: E87.1   ICD-9-CM: 276.1    08/03/2019 - Present  Yes                        ABLA (acute blood loss anemia)  ICD-10-CM: D62   ICD-9-CM: 285.1    08/03/2019 - Present  Yes  Acute metabolic encephalopathy  ICD-10-CM: G93.41   ICD-9-CM: 348.31    08/03/2019 - Present  Yes                        Nausea and vomiting  ICD-10-CM: R11.2   ICD-9-CM: 787.01    08/03/2019 - Present  Yes                        Leukocytosis  ICD-10-CM: D72.829   ICD-9-CM: 288.60    08/03/2019 - Present  Yes                        History of total hip arthroplasty, left  ICD-10-CM: E95.284   ICD-9-CM: V43.64    08/03/2019 - Present  Yes                        Essential hypertension  ICD-10-CM: I10   ICD-9-CM: 401.9    04/28/2019 - Present  Yes                        Paroxysmal atrial fibrillation (HCC)  ICD-10-CM: I48.0   ICD-9-CM: 427.31    04/28/2019 - Present  Yes                        Pure hyperglyceridemia  ICD-10-CM: E78.1   ICD-9-CM: 272.1    04/28/2019 - Present  Yes                            Active Medical Issues and Plan (MDM):        Principal Problem:     Acute hyponatremia (08/03/2019)   - Sodium 138 (07/14/19) --> 117 (08/03/19)   - Currently 117 --> 126 --> 131   - Likely due to vomiting and dehydration   - Stop normal saline   - UA with ketones, otherwise normal   - Urine sodium 17 + osm 236   - Serum osm 246   - TFTs unremarkable   - Triglycerides 80   - Trend sodium Q6H   - Strict I/O   ??   Active Problems:     Atrial Fibrillation with RVR (08/04/2019)   - The patient flipped into Afib RVR - now back in NSR   - Off Cardizem gtt   - Continue Lopressor 50mg  BID   - Continue Eliquis 2.5 BID   - Give Lopressor IV 5mg  PRN if BP permits        ABLA (acute blood loss anemia) (08/03/2019)   - Likely due to left THA   - Hgb 7.4 at discharge  --> admit 6.6 --> 1U PRBC --> 7.9 --> 7.4   - Continue Eliquis due to flipping in/out of Afib   - Transfuse Hgb <7.0   ??     Acute metabolic encephalopathy (08/03/2019)   - Worse overnight   - Pleasantly confused   - Continue Seroquel 100mg  QHS   - Likely due to hyponatremia + delirium   - UA unremarkable   - TFTs unremarkable   ??     Nausea and vomiting (08/03/2019)   - Resolved   - Unsure etiology --> has been happening since post-op per son   - No abdominal pain   - Start Zofran PRN   - Start Phenergan PRN   ??  Leukocytosis (08/03/2019)   - I suspect this is reactive   - Resolved   - Decrease normal saline   - UA unremarkable   ??     Essential hypertension (04/28/2019)   - Stable   - Continue Lopressor   - Continue ARB   ??     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     Paroxysmal atrial fibrillation (HCC) (04/28/2019)   - Currently NSR   - Continue Lopressor   - Continue low dose Eliquis      Otherwise all chronic medical issues appear stable with no changes.      Diet: ADULT DIET Regular   ADULT ORAL NUTRITION SUPPLEMENT Breakfast; Clear Liquid   ADULT ORAL NUTRITION SUPPLEMENT Lunch; Standard High Calorie/High Protein   ADULT ORAL NUTRITION SUPPLEMENT Dinner; Frozen Supplement   VTE ppx: Eliquis        Objective:        Visit Vitals      BP  (!) 108/45     Pulse  83     Temp  98.8 ??F (37.1 ??C)     Resp  16     Ht  5\' 3"  (1.6 m)     Wt  89 kg (196 lb 3.4 oz)     SpO2  98%        BMI  34.76 kg/m??         Oxygen Therapy   O2 Sat (%): 98 % (08/05/19 0540)   Pulse via Oximetry: 83 beats per minute (08/05/19 0540)   O2 Device: None (Room air) (08/05/19 0755)   Skin Assessment: Clean, dry, & intact (08/04/19 1922)   Skin Protection for O2 Device: No (08/04/19 1922)   O2 Flow Rate (L/min): 0 l/min (08/05/19 0755)         Intake and Output:       Intake/Output Summary (Last 24 hours) at 08/05/2019 0821   Last data filed at 08/05/2019 0537     Gross per  24 hour        Intake  172.71 ml        Output  2450 ml        Net  -2277.29 ml              Physical Exam:    GENERAL: A&Ox1, no distress, appears stated age   EYE: conjunctivae/corneas clear. PERRL.   THROAT & NECK: normal and no erythema or exudates noted.    LUNG: clear to auscultation bilaterally   HEART: tachy, irregular rate and rhythm, S1S2, no murmur, no JVD   ABDOMEN: soft, non-tender, non-distended. Bowel sounds normal.    EXTREMITIES:  No edema, 2+ pedal/radial pulses bilaterally   SKIN: no rash or abnormalities   NEUROLOGIC: A&Ox1. Cranial nerves 2-12 grossly intact.      Lab/Data Review:     Recent Results (from the past 24 hour(s))     SODIUM          Collection Time: 08/04/19 11:54 AM         Result  Value  Ref Range            Sodium  129 (L)  136 - 145 mmol/L       SODIUM          Collection Time: 08/04/19 10:14 PM  Result  Value  Ref Range            Sodium  130 (L)  136 - 145 mmol/L       METABOLIC PANEL, BASIC          Collection Time: 08/05/19  4:14 AM         Result  Value  Ref Range            Sodium  131 (L)  136 - 145 mmol/L       Potassium  4.2  3.5 - 5.1 mmol/L       Chloride  100  98 - 107 mmol/L       CO2  27  21 - 32 mmol/L            Anion gap  4 (L)  7 - 16 mmol/L            Glucose  125 (H)  65 - 100 mg/dL       BUN  12  8 - 23 MG/DL       Creatinine  0.10 (L)  0.6 - 1.0 MG/DL       GFR est AA  >27  >60 ml/min/1.41m2       GFR est non-AA  >60  >60 ml/min/1.30m2       Calcium  7.7 (L)  8.3 - 10.4 MG/DL       CBC W/O DIFF          Collection Time: 08/05/19  4:14 AM         Result  Value  Ref Range            WBC  8.5  4.3 - 11.1 K/uL       RBC  2.42 (L)  4.05 - 5.2 M/uL       HGB  7.4 (L)  11.7 - 15.4 g/dL       HCT  25.3 (L)  66.4 - 46.3 %       MCV  87.6  79.6 - 97.8 FL       MCH  30.6  26.1 - 32.9 PG       MCHC  34.9  31.4 - 35.0 g/dL       RDW  40.3  47.4 - 14.6 %       PLATELET  182  150 - 450 K/uL       MPV  10.0  9.4 - 12.3 FL            ABSOLUTE NRBC  0.02  0.0 - 0.2  K/uL           SARS-CoV-2 Lab Results     "Novel Coronavirus" Test: No results found for: COV2NT     "Emergent Disease" Test: No results found for: EDPR   "SARS-COV-2" Test: No results found for: XGCOVT   "Precision Labs" Test: No results found for: RSLT   Rapid Test: No results found for: COVR            Imaging:   XR PELV AP ONLY      Result Date: 07/30/2019   EXAMINATION: XR PELV AP ONLY 07/30/2019 12:36 PM ACCESSION NUMBER: [id number] INDICATION: Post op total hip COMPARISON: Left hip x-rays 04/22/2019 TECHNIQUE: A single AP supine view of the pelvis was obtained. FINDINGS: Immediate postoperative changes status  post left hip arthroplasty, including subcutaneous emphysema and soft tissue swelling. Unremarkable immediate postoperative appearance of the hardware. 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 Pubic symphysis degenerative  change.       Unremarkable immediate postoperative appearance of a left hip total arthroplasty. VOICE DICTATED BY: Dr. Caryl ComesStetson Bickley      XR ABD ACUTE W 1 V CHEST      Result Date: 08/03/2019   EXAM: Acute abdominal series INDICATION: abd pain. COMPARISON: None available TECHNIQUE: A single view of the chest and 2 views of the abdomen were obtained. FINDINGS: The bowel gas pattern is nonobstructive. No intraperitoneal free air is evident. Moderate  retained fecal burden in the right colon. The lungs are clear and the pulmonary vasculature is normal. No pneumothorax or pleural effusion. The cardiomediastinal silhouette is within normal limits. Left hip arthroplasty is unremarkable.       1.  No acute radiographic abnormality of the chest or abdomen. 2.  Moderate retained fecal burden in the right colon.      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  Standard views with 3-D tomosynthesis Spot CC and MLO views The focal asymmetry likely effaces on compression magnification CC, MLO, and ML views a total symphysis cc and MLO compression magnification views were then  obtained where there is normal superimposed fibroglandular tissue at the area of asymmetry. No concerning mass or calcifications.       Benign left mammogram Recommend screening mammography in one year. A reminder letter will be scheduled.  This was reported to the patient at the time of interpretation by the techologist. BI-RADS Assessment Category 2: Benign finding       No results found for this visit on 08/03/19.      Cultures:     All Micro Results           None                    Discharge Plan:        Transfer to the floor with close monitoring         Signed By:  Cammy CopaWoodson E Kalep Full, DO           August 05, 2019

## 2019-08-05 NOTE — Progress Notes (Signed)
 OUTREACH NURSE PROGRESS REPORT    SUBJECTIVE: In to assess patient secondary to discharge from ICU to floor.      Brandy Vargas was seen and focused assessment complete.      MEWS Score: 1 (08/05/19 1704)  Vitals:    08/05/19 1400 08/05/19 1600 08/05/19 1704 08/05/19 1939   BP: (!) 95/52 (!) 94/42 122/68 111/72   Pulse: 76 71 85 83   Resp: 14 13 18 18    Temp:   98 F (36.7 C) 97.8 F (36.6 C)   SpO2: 100% 98% 91% 94%   Weight:       Height:              OBJECTIVE: On arrival to room, I found patient to be semi-fowler's in bed, resting with eyes closed and even non-labored respirations. Patient awake upon arrival in room.    ASSESSMENT:   Visit Vitals  BP 111/72 (BP 1 Location: Right upper arm, BP Patient Position: At rest)   Pulse 83   Temp 97.8 F (36.6 C)   Resp 18   Ht 5' 3 (1.6 m)   Wt 89 kg (196 lb 3.4 oz)   SpO2 94%   BMI 34.76 kg/m     General:  Alert though fatigued. Patient alert to person and year. Some confusion lingers as to place/situation though appears to be improving.   Head:  Normocephalic, without obvious abnormality, atraumatic.   Eyes:  Conjunctivae/corneas clear. Pupils equal, round, reactive to light.    Lungs:   Clear to auscultation bilaterally.   Chest wall:  No tenderness or deformity.   Heart:  Regular rate and rhythm, S1, S2 normal   Abdomen:   Soft, non-tender. Bowel sounds normal.   Extremities: Extremities normal, atraumatic, no cyanosis or edema.   Pulses: 2+ and symmetric all extremities.   Skin: Skin color, texture, turgor normal. No rashes or lesions.       Neurologic: No focal deficits. Patient with = strength and sensation bilat though with generalized weakness.           Pain Assessment  Pain Intensity 1: 0 (08/05/19 1939)  Pain Location 1: Hip  Pain Intervention(s) 1: Medication (see MAR), Repositioned  Patient Stated Pain Goal: 0      PLAN:  Patient with slow improvement in mentation. Discussed with primary RN Mikala with no needs expressed or apparent. Will continue to  follow per outreach protocol.

## 2019-08-05 NOTE — Progress Notes (Signed)
Care Management Interventions  PCP Verified by CM: Yes  Last Visit to PCP: 07/10/19  Current Support Network: Lives Alone  Discharge Location  Discharge Placement: Unable to determine at this time    Saw pt in interdisciplinarily rounds with the following disciplines: Physician, Case Management, Nursing, Dietary,Therapy and Pharmacy. The plan of care was discussed along with goals for discharge date/ location.   Per MD pt will remain in ICU today, was able to wean Cardizem gtt off, pt remains confused. PT will attempt to see pt today. LM with pt's son, I called both [phone] & [phone].    @1130  Son/Scoot here and meet with him to discuss plan of care. He is interested in mom going to a STR, a list was provided and pt was able to participate with PT/Ot today. Will revisit tomorrow.

## 2019-08-05 NOTE — Progress Notes (Signed)
Patient assessment completed, patient settled in room no resting with eyes closed and no signs of pain. Bed low/locked/alarm on.

## 2019-08-05 NOTE — Progress Notes (Signed)
Critical Care Interdisciplinary Rounds with staff.    Cindy Bishop, M.Div.  Chaplain

## 2019-08-05 NOTE — Progress Notes (Signed)
Problem: Pressure Injury - Risk of  Goal: *Prevention of pressure injury  Description: Document Braden Scale and appropriate interventions in the flowsheet.  Outcome: Progressing Towards Goal  Note: Pressure Injury Interventions:  Sensory Interventions: Assess changes in LOC, Assess need for specialty bed, Check visual cues for pain, Discuss PT/OT consult with provider, Keep linens dry and wrinkle-free, Maintain/enhance activity level, Minimize linen layers, Monitor skin under medical devices, Turn and reposition approx. every two hours (pillows and wedges if needed)    Moisture Interventions: Absorbent underpads, Apply protective barrier, creams and emollients, Check for incontinence Q2 hours and as needed, Internal/External urinary devices, Maintain skin hydration (lotion/cream), Minimize layers, Moisture barrier, Offer toileting Q_hr    Activity Interventions: Increase time out of bed, Pressure redistribution bed/mattress(bed type), PT/OT evaluation    Mobility Interventions: HOB 30 degrees or less, Pressure redistribution bed/mattress (bed type), PT/OT evaluation, Turn and reposition approx. every two hours(pillow and wedges)    Nutrition Interventions: Document food/fluid/supplement intake, Offer support with meals,snacks and hydration    Friction and Shear Interventions: Apply protective barrier, creams and emollients, Foam dressings/transparent film/skin sealants, HOB 30 degrees or less, Lift sheet, Minimize layers, Transferring/repositioning devices                Problem: Patient Education: Go to Patient Education Activity  Goal: Patient/Family Education  Outcome: Progressing Towards Goal     Problem: Falls - Risk of  Goal: *Absence of Falls  Description: Document Schmid Fall Risk and appropriate interventions in the flowsheet.  Outcome: Progressing Towards Goal  Note: Fall Risk Interventions:       Mentation Interventions: Adequate sleep, hydration, pain control, Bed/chair exit alarm, Door open when  patient unattended, Evaluate medications/consider consulting pharmacy, Eyeglasses and hearing aids, More frequent rounding, Reorient patient, Room close to nurse's station, Toileting rounds, Update white board    Medication Interventions: Bed/chair exit alarm, Evaluate medications/consider consulting pharmacy, Patient to call before getting OOB, Teach patient to arise slowly, Utilize gait belt for transfers/ambulation    Elimination Interventions: Bed/chair exit alarm, Call light in reach    History of Falls Interventions: Bed/chair exit alarm, Consult care management for discharge planning, Door open when patient unattended, Evaluate medications/consider consulting pharmacy, Investigate reason for fall, Room close to nurse's station, Utilize gait belt for transfer/ambulation, Assess for delayed presentation/identification of injury for 48 hrs (comment for end date), Vital signs minimum Q4HRs X 24 hrs (comment for end date)         Problem: Patient Education: Go to Patient Education Activity  Goal: Patient/Family Education  Outcome: Progressing Towards Goal     Problem: Delirium Treatment  Goal: *Level of consciousness restored to baseline  Outcome: Progressing Towards Goal  Goal: *Level of environmental perceptions restored to baseline  Outcome: Progressing Towards Goal  Goal: *Sensory perception restored to baseline  Outcome: Progressing Towards Goal  Goal: *Emotional stability restored to baseline  Outcome: Progressing Towards Goal  Goal: *Functional assessment restored to baseline  Outcome: Progressing Towards Goal  Goal: *Absence of falls  Outcome: Progressing Towards Goal  Goal: *Will remain free of delirium, CAM Score negative  Outcome: Progressing Towards Goal  Goal: *Cognitive status will be restored to baseline  Outcome: Progressing Towards Goal  Goal: Interventions  Outcome: Progressing Towards Goal     Problem: Patient Education: Go to Patient Education Activity  Goal: Patient/Family Education  Outcome:  Progressing Towards Goal

## 2019-08-05 NOTE — Progress Notes (Signed)
Bedside report given to Carina RN.

## 2019-08-05 NOTE — Progress Notes (Signed)
Problem: Mobility Impaired (Adult and Pediatric)  Goal: *Acute Goals and Plan of Care (Insert Text)  Outcome: Progressing Towards Goal  Note: STG:  (1.)Brandy Vargas will move from supine to sit and sit to supine  with MODERATE ASSIST within 5 treatment day(s).    (2.)Brandy Vargas will transfer from bed to chair and chair to bed with MODERATE ASSIST using the least restrictive device within 5 treatment day(s).    (3.)Brandy Vargas will ambulate with MODERATE ASSIST for 10 feet with the least restrictive device within 5 treatment day(s).     LTG:  (1.)Brandy Vargas will move from supine to sit and sit to supine  in bed with CONTACT GUARD ASSIST within 10 treatment day(s).    (2.)Brandy Vargas will transfer from bed to chair and chair to bed with CONTACT GUARD ASSIST using the least restrictive device within 10 treatment day(s).    (3.)Brandy Vargas will ambulate with CONTACT GUARD ASSIST for 50 feet with the least restrictive device within 10 treatment day(s).  ________________________________________________________________________________________________       PHYSICAL THERAPY: Initial Assessment and AM 08/05/2019  INPATIENT: PT Visit Days : 1  Payor: AARP MEDICARE COMPLETE / Plan: [facility] AARP MEDICARE COMPLETE / Product Type: Managed Care Medicare /       NAME/AGE/GENDER: Brandy Vargas is a 79 y.o. female   PRIMARY DIAGNOSIS: Acute hyponatremia [E87.1]  ABLA (acute blood loss anemia) [D62]  Acute metabolic encephalopathy [G93.41] Acute hyponatremia Acute hyponatremia        ICD-10: Treatment Diagnosis:    Generalized Muscle Weakness (M62.81)  Other lack of cordination (R27.8)  Difficulty in walking, Not elsewhere classified (R26.2)   Precaution/Allergies:  Omeprazole magnesium      ASSESSMENT:     Brandy Vargas presents with above diagnoses.  Patient underwent L THA on 07/30/19.  She d/c'd home with assist from her son on 08/01/19.  At time of d/c, she was ambulatory with SBA and was able to negotiate steps with B rails.  Son  reports patient did not do well once she returned home with reports of weakness and inability to walk.  She also developed significant confusion.  Patient is currently functioning well below baseline and below level of function at time of d/c on 7/31.  She would benefit from therapy to address her function and mobility.  Expect patient will need SNF at d/c for additional rehab.      Patient required max-total assist from bed mobility and mod A for taking steps.  Increased level of assist can likely be attributed to increased confusion.  Hope she'll progress as/if confusion resolves.     This section established at most recent assessment   PROBLEM LIST (Impairments causing functional limitations):  Decreased Strength  Decreased ADL/Functional Activities  Decreased Transfer Abilities  Decreased Ambulation Ability/Technique  Decreased Balance  Increased Pain  Decreased Activity Tolerance  Decreased Flexibility/Joint Mobility  Edema/Girth  Decreased Knowledge of Precautions  Decreased Independence with Home Exercise Program  Decreased Cognition   INTERVENTIONS PLANNED: (Benefits and precautions of physical therapy have been discussed with the patient.)  Balance Exercise  Bed Mobility  Family Education  Gait Training  Home Exercise Program (HEP)  Therapeutic Activites  Therapeutic Exercise/Strengthening  Transfer Training     TREATMENT PLAN: Frequency/Duration: daily for duration of hospital stay  Rehabilitation Potential For Stated Goals: Good     REHAB RECOMMENDATIONS (at time of discharge pending progress):    Placement:  It is my opinion, based on this patient's  performance to date, that Brandy Vargas may benefit from intensive therapy at a [facility] F[facility] after discharge due to the functional deficits listed above that are likely to improve with skilled rehabilitation and concerns that he/she may be unsafe to be unsupervised at home due to confusion .  Equipment:   None at this time              HISTORY:    History of Present Injury/Illness (Reason for Referral):  Brandy Vargas is a 79 year old CF with a PMH of PAF, HTN, GERD, and h/o breast CA who presented to the ER with 3-4 days of nausea and vomiting and 24 hours of waxing and waning confusion and hallucinating. She had a left THA on 07/30/19 and did well with the surgery. Per the son, the patient was nauseated and had some vomiting post-op. She continued to have this, but was discharged on 08/01/19. The patient went home and continued to have vomiting and was unable to hold anything down. Then on the evening of 08/02/19 she became confused and was seeing ants on her walls. Her son got her comfortable in the chair and she fell asleep. On the morning of 08/03/19 he found the patient in the floor so he called EMS. Complains of indigestion. Denies CP/SOB. Denies dizziness. Denies diarrhea. Denies abdominal pain.  Past Medical History/Comorbidities:   Brandy Vargas  has a past medical history of Arthritis, Atrial fibrillation (HCC), Breast cancer (HCC) (2015), COVID-19 vaccine series completed (04/08/2019), GERD (gastroesophageal reflux disease), Hypertension, and Overactive bladder. She also has no past medical history of Malignant hyperthermia due to anesthesia or Pseudocholinesterase deficiency.  Brandy Vargas  has a past surgical history that includes hx appendectomy; hx tubal ligation (1976); pr breast surgery procedure unlisted (Right, 2015); hx knee arthroscopy; and hx breast lumpectomy (Right).  Social History/Living Environment:   Home Environment: Private residence  # Steps to Enter: 5  One/Two Story Residence: One story  Living Alone: Yes  Support Systems: Child(ren)  Patient Expects to be Discharged to:: Unknown  Current DME Used/Available at Home: Environmental consultant, rollator, [employer], straight  Prior Level of Function/Work/Activity:  THA 07/30/19.  Independent prior to THA.     Number of Personal Factors/Comorbidities that affect the Plan of Care: 1-2: MODERATE COMPLEXITY    EXAMINATION:   Most Recent Physical Functioning:   Gross Assessment:  AROM: Generally decreased, functional  Strength: Generally decreased, functional  Coordination: Generally decreased, functional               Posture:     Balance:  Sitting: Impaired  Sitting - Static: Fair (occasional)  Sitting - Dynamic: Fair (occasional)  Standing: Pull to stand;With support Bed Mobility:  Supine to Sit: Maximum assistance  Sit to Supine: Total assistance  Scooting: Moderate assistance  Wheelchair Mobility:     Transfers:  Sit to Stand: Moderate assistance  Stand to Sit: Minimum assistance  Gait:  Left Side Weight Bearing: As tolerated  Base of Support: Center of gravity altered  Speed/Cadence: Delayed;Shuffled  Step Length: Left shortened;Right shortened  Stance: Left decreased  Gait Abnormalities: Decreased step clearance  Distance (ft):  (3-4 steps to R x 2)  Assistive Device: Walker, rolling  Ambulation - Level of Assistance: Moderate assistance  Interventions: Safety awareness training;Tactile cues;Verbal cues      Body Structures Involved:  Joints  Muscles Body Functions Affected:  Movement Related Activities and Participation Affected:  Mobility   Number of elements that affect the Plan  of Care: 4+: HIGH COMPLEXITY   CLINICAL PRESENTATION:   Presentation: Evolving clinical presentation with changing clinical characteristics: MODERATE COMPLEXITY   CLINICAL DECISION MAKING:   [employer] AM-PACT "6 Clicks"   Basic Mobility Inpatient Short Form  How much difficulty does the patient currently have... Unable A Lot A Little None   1.  Turning over in bed (including adjusting bedclothes, sheets and blankets)?   []  1   [x]  2   []  3   []  4   2.  Sitting down on and standing up from a chair with arms ( e.g., wheelchair, bedside commode, etc.)   []  1   [x]  2   []  3   []  4   3.  Moving from lying on back to sitting on the side of the bed?   []  1   [x]  2   []  3   []  4   How much help from another person does the patient  currently need... Total A Lot A Little None   4.  Moving to and from a bed to a chair (including a wheelchair)?   []  1   [x]  2   []  3   []  4   5.  Need to walk in hospital room?   []  1   [x]  2   []  3   []  4   6.  Climbing 3-5 steps with a railing?   [x]  1   []  2   []  3   []  4    2007, Trustees of [address], under license to [place], [place]. All rights reserved      Score:  Initial: 11 Most Recent: X (Date: -- )    Interpretation of Tool:  Represents activities that are increasingly more difficult (i.e. Bed mobility, Transfers, Gait).    Medical Necessity:     Patient is expected to demonstrate progress in   strength, range of motion, balance, coordination, and functional technique   to   decrease assistance required with mobility.  .  Reason for Services/Other Comments:  Patient continues to require skilled intervention due to   Weakness and confusion limiting mobility.  .   Use of outcome tool(s) and clinical judgement create a POC that gives a: Questionable prediction of patient's progress: MODERATE COMPLEXITY            TREATMENT:   (In addition to Assessment/Re-Assessment sessions the following treatments were rendered)   Pre-treatment Symptoms/Complaints:  Patient agreeable.  Pain: Initial:   Pain Intensity 1: 4  Post Session:  4     Therapeutic Activity: (    25 minutes):  Therapeutic activities including Bed transfers, Ambulation on level ground, sitting balance and standing balance, and LE exercises as below to improve mobility, strength, balance, and coordination.  Required moderate Safety awareness training;Tactile cues;Verbal cues to promote static and dynamic balance in sitting and standing and promote coordination of bilateral, lower extremity(s).       Braces/Orthotics/Lines/Etc:   Telemetry and purewick  O2 Device: None (Room air)  Treatment/Session Assessment:    Response to Treatment:  Patient pleasantly confused.  Needing much more assist for all mobility than at time of d/c on  08/01/19  Interdisciplinary Collaboration:   Physical Therapist  Occupational Therapist  Registered Nurse  After treatment position/precautions:   Supine in bed  Bed/Chair-wheels locked  Call light within reach  RN notified  Family at bedside   Compliance with Program/Exercises: Will assess as treatment progresses  Recommendations/Intent for next  treatment session:  "Next visit will focus on advancements to more challenging activities and reduction in assistance provided".  Total Treatment Duration:  PT Patient Time In/Time Out  Time In: 1040  Time Out: 1110    Georgeanna Harrison, PT

## 2019-08-06 ENCOUNTER — Encounter: Attending: Cardiovascular Disease | Primary: Family Medicine

## 2019-08-06 LAB — BASIC METABOLIC PANEL
Anion Gap: 6 mmol/L — ABNORMAL LOW (ref 7–16)
BUN: 19 MG/DL (ref 8–23)
CO2: 26 mmol/L (ref 21–32)
Calcium: 7.8 MG/DL — ABNORMAL LOW (ref 8.3–10.4)
Chloride: 99 mmol/L (ref 98–107)
Creatinine: 0.55 MG/DL — ABNORMAL LOW (ref 0.6–1.0)
EGFR IF NonAfrican American: >60 mL/min/{1.73_m2} (ref >60)
GFR African American: >60 mL/min/{1.73_m2} (ref >60)
Glucose: 104 mg/dL — ABNORMAL HIGH (ref 65–100)
Potassium: 4.2 mmol/L (ref 3.5–5.1)
Sodium: 131 mmol/L — ABNORMAL LOW (ref 136–145)

## 2019-08-06 LAB — CBC
Hematocrit: 22 % — ABNORMAL LOW (ref 35.8–46.3)
Hemoglobin: 7.5 g/dL — ABNORMAL LOW (ref 11.7–15.4)
MCH: 30.4 PG (ref 26.1–32.9)
MCHC: 34.1 g/dL (ref 31.4–35.0)
MCV: 89.1 FL (ref 79.6–97.8)
MPV: 9.6 FL (ref 9.4–12.3)
NRBC Absolute: 0 10*3/uL (ref 0.0–0.2)
Platelets: 208 10*3/uL (ref 150–450)
RBC: 2.47 M/uL — ABNORMAL LOW (ref 4.05–5.2)
RDW: 14.1 % (ref 11.9–14.6)
WBC: 8.9 10*3/uL (ref 4.3–11.1)

## 2019-08-06 LAB — CBC W/O DIFF
ABSOLUTE NRBC: 0 10*3/uL (ref 0.0–0.2)
HCT: 22 % — ABNORMAL LOW (ref 35.8–46.3)
HGB: 7.5 g/dL — ABNORMAL LOW (ref 11.7–15.4)
MCH: 30.4 PG (ref 26.1–32.9)
MCHC: 34.1 g/dL (ref 31.4–35.0)
MCV: 89.1 FL (ref 79.6–97.8)
MPV: 9.6 FL (ref 9.4–12.3)
PLATELET: 208 10*3/uL (ref 150–450)
RBC: 2.47 M/uL — ABNORMAL LOW (ref 4.05–5.2)
RDW: 14.1 % (ref 11.9–14.6)
WBC: 8.9 10*3/uL (ref 4.3–11.1)

## 2019-08-06 LAB — METABOLIC PANEL, BASIC
Anion gap: 6 mmol/L — ABNORMAL LOW (ref 7–16)
BUN: 19 MG/DL (ref 8–23)
CO2: 26 mmol/L (ref 21–32)
Calcium: 7.8 MG/DL — ABNORMAL LOW (ref 8.3–10.4)
Chloride: 99 mmol/L (ref 98–107)
Creatinine: 0.55 MG/DL — ABNORMAL LOW (ref 0.6–1.0)
GFR est AA: >60 mL/min/{1.73_m2} (ref >60)
GFR est non-AA: >60 mL/min/{1.73_m2} (ref >60)
Glucose: 104 mg/dL — ABNORMAL HIGH (ref 65–100)
Potassium: 4.2 mmol/L (ref 3.5–5.1)
Sodium: 131 mmol/L — ABNORMAL LOW (ref 136–145)

## 2019-08-06 MED ORDER — QUETIAPINE 25 MG TAB
25 mg | Freq: Every evening | ORAL | Status: DC | PRN
Start: 2019-08-06 — End: 2019-08-12

## 2019-08-06 MED ORDER — FAMOTIDINE 20 MG TAB
20 mg | Freq: Every day | ORAL | Status: DC
Start: 2019-08-06 — End: 2019-08-12
  Administered 2019-08-07 – 2019-08-12 (×6): via ORAL

## 2019-08-06 MED ORDER — SENNOSIDES-DOCUSATE SODIUM 8.6 MG-50 MG TAB
Freq: Two times a day (BID) | ORAL | Status: DC
Start: 2019-08-06 — End: 2019-08-12
  Administered 2019-08-06 – 2019-08-12 (×6): via ORAL

## 2019-08-06 MED FILL — ATORVASTATIN 10 MG TAB: 10 mg | ORAL | Qty: 1

## 2019-08-06 MED FILL — SUCRALFATE 1 GRAM TAB: 1 gram | ORAL | Qty: 1

## 2019-08-06 MED FILL — METOPROLOL TARTRATE 50 MG TAB: 50 mg | ORAL | Qty: 1

## 2019-08-06 MED FILL — ELIQUIS 2.5 MG TABLET: 2.5 mg | ORAL | Qty: 1

## 2019-08-06 MED FILL — QUETIAPINE 25 MG TAB: 25 mg | ORAL | Qty: 4

## 2019-08-06 MED FILL — VALSARTAN 320 MG TAB: 320 mg | ORAL | Qty: 1

## 2019-08-06 MED FILL — FAMOTIDINE 20 MG TAB: 20 mg | ORAL | Qty: 1

## 2019-08-06 MED FILL — ASPIRIN 81 MG CHEWABLE TAB: 81 mg | ORAL | Qty: 1

## 2019-08-06 MED FILL — TROSPIUM 20 MG TAB: 20 mg | ORAL | Qty: 1

## 2019-08-06 NOTE — Progress Notes (Signed)
Patient received resting in bedside recliner. Shift assessment completed, will monitor.

## 2019-08-06 NOTE — Progress Notes (Signed)
CM spoke with patient and son, Lorin Picket, at bedside. They are considering STR, but son would prefer to bring patient home at discharge if possible. Per son, patient is much clearer cognitively today and is moving better. Patient/son to discuss whether rehab or home with home health and caregivers would be better. They have been given list of rehab facilities and CM also provided son with list of [facility] agencies, as well as Haematologist agencies. He states he will contact CM when they have made a decision. CM will follow.

## 2019-08-06 NOTE — Progress Notes (Signed)
Progress  Notes by Scarlett Presto, MD at 08/06/19 1654                Author: Scarlett Presto, MD  Service: Internal Medicine  Author Type: Physician       Filed: 08/06/19 1703  Date of Service: 08/06/19 1654  Status: Addendum          Editor: Scarlett Presto, MD (Physician)          Related Notes: Original Note by Scarlett Presto, MD (Physician) filed at 08/06/19 1659                         [facility] Hospitalist Progress Note          Patient: Brandy Vargas  MRN: [id number]   SSN: [id number]          Date of Birth: 10/17/1940   Age: 79 y.o.   Sex: female         Admit Date: 08/03/2019     LOS: 3 days         Subjective:        Chief Complaint: Confusion   Reason for Admission: Acute hyponatremia      79 year old CF with a PMH of PAF, HTN, GERD, and h/o breast CA who presented to the ER with 3-4 days of nausea and vomiting and 24 hours of waxing and waning confusion  and hallucinating. She had a left THA on 07/30/19 and did well with the surgery. Per the son, the patient was nauseated and had some vomiting post-op. She continued to have this, but was discharged on 08/01/19. The patient went home and continued to have  vomiting and was unable to hold anything down. Then on the evening of 08/02/19 she became confused and was seeing ants on her walls. Her son got her comfortable in the chair and she fell asleep. On the morning of 08/03/19 he found the patient in the floor  so he called EMS. She was admitted for sodium 117 and anemia. She was transfused 1U PRBC. She became acutely confused and has persisted.      8/4 - She continues to be confused this AM. Not agitated, but hallucinating and not sure what she's saying. Thinks she is at the "[facility]". Reaching for her non-existent keys on her table. Says she slept well (didn't sleep much last night  per nursing). Denies CP/SOB. Denies N/V/D.      August 5: Patient is AOx3 today.  Eating her breakfast.  Son was present at bedside and as per son patient has started  around 180 degrees.  Patient denies any nausea, vomiting, diarrhea, chest pain or shortness of breath.      Review of systems negative except stated above.        Assessment:        Primary Diagnosis: Acute hyponatremia         Hospital Problems  as of 08/06/2019  Date Reviewed:  July 29, 2019                     Codes  Class  Noted - Resolved  POA              * (Principal) Acute hyponatremia  ICD-10-CM: E87.1   ICD-9-CM: 276.1    08/03/2019 - Present  Yes  ABLA (acute blood loss anemia)  ICD-10-CM: D62   ICD-9-CM: 285.1    08/03/2019 - Present  Yes                        History of total hip arthroplasty, left  ICD-10-CM: Z61.096Z96.642   ICD-9-CM: V43.64    08/03/2019 - Present  Yes                        Leukocytosis  ICD-10-CM: D72.829   ICD-9-CM: 288.60    08/03/2019 - Present  Yes                        Pure hyperglyceridemia  ICD-10-CM: E78.1   ICD-9-CM: 272.1    04/28/2019 - Present  Yes                        Paroxysmal atrial fibrillation (HCC)  ICD-10-CM: I48.0   ICD-9-CM: 427.31    04/28/2019 - Present  Yes                        Essential hypertension  ICD-10-CM: I10   ICD-9-CM: 401.9    04/28/2019 - Present  Yes                        RESOLVED: Acute metabolic encephalopathy  ICD-10-CM: G93.41   ICD-9-CM: 348.31    08/03/2019 - 08/06/2019  Yes                        RESOLVED: Nausea and vomiting  ICD-10-CM: R11.2   ICD-9-CM: 787.01    08/03/2019 - 08/06/2019  Yes                            Active Medical Issues and Plan (MDM):        Principal Problem:     Acute hyponatremia (08/03/2019)   - Sodium 138 (07/14/19) --> 117 (08/03/19)   - Currently 117 --> 126 --> 131   - Likely due to vomiting and dehydration   - Stop normal saline   - UA with ketones, otherwise normal   - Urine sodium 17 + osm 236   - Serum osm 246   - TFTs unremarkable   - Triglycerides 80   - Trend sodium Q6H   - Strict I/O   ??   August 5: Sodium stable.  Patient is AOx3.  Discussed with the patient and patient's son to increase p.o. intake.  Will  monitor.   Likely secondary to dehydration secondary to nausea vomiting.      Active Problems:     Atrial Fibrillation with RVR (08/04/2019)   - The patient flipped into Afib RVR - now back in NSR   - Off Cardizem gtt   - Continue Lopressor 50mg  BID   - Continue Eliquis 2.5 BID   - Give Lopressor IV 5mg  PRN if BP permits        ABLA (acute blood loss anemia) (08/03/2019)   - Likely due to left THA   - Hgb 7.4 at discharge --> admit 6.6 --> 1U PRBC --> 7.9 --> 7.4   - Continue Eliquis due to flipping in/out of Afib   - Transfuse Hgb <7.0   ??   August 5: As patient received  blood transfusion during hospitalization so we will check iron studies, B12 and folate level tomorrow morning.        Acute metabolic encephalopathy (08/03/2019)   - Worse overnight   - Pleasantly confused   - Continue Seroquel 100mg  QHS   - Likely due to hyponatremia + delirium   - UA unremarkable   - TFTs unremarkable   ??   August 5: Resolved.  Will change Seroquel to as needed.        Nausea and vomiting (08/03/2019)   - Resolved   - Unsure etiology --> has been happening since post-op per son   - No abdominal pain   - Start Zofran PRN   - Start Phenergan PRN   ??   August 5: Resolved.        Leukocytosis (08/03/2019)   - I suspect this is reactive   - Resolved   - Decrease normal saline   - UA unremarkable   August 5: Resolved.        Essential hypertension (04/28/2019)   - Stable   - Continue Lopressor   - Continue ARB   ??     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     Paroxysmal atrial fibrillation (HCC) (04/28/2019)   - Currently NSR   - Continue Lopressor   - Continue low dose Eliquis      Otherwise all chronic medical issues appear stable with no changes.      Diet: ADULT ORAL NUTRITION SUPPLEMENT Breakfast; Clear Liquid   ADULT ORAL NUTRITION SUPPLEMENT Lunch; Standard High Calorie/High Protein   ADULT ORAL NUTRITION SUPPLEMENT Dinner; Frozen Supplement   ADULT DIET Regular    VTE ppx: Eliquis      Patient is AOx3.  Her son was present at bedside [Scott].  Plan discussed.        Objective:        Visit Vitals      BP  (!) 91/49     Pulse  79     Temp  98 ??F (36.7 ??C)     Resp  18     Ht  5\' 3"  (1.6 m)     Wt  89 kg (196 lb 3.4 oz)     SpO2  93%        BMI  34.76 kg/m??         Oxygen Therapy   O2 Sat (%): 93 % (08/06/19 1550)   Pulse via Oximetry: 72 beats per minute (08/05/19 1600)   O2 Device: None (Room air) (08/05/19 1939)   Skin Assessment: Clean, dry, & intact (08/04/19 1922)   Skin Protection for O2 Device: No (08/04/19 1922)   O2 Flow Rate (L/min): 0 l/min (08/05/19 0755)         Intake and Output:       Intake/Output Summary (Last 24 hours) at 08/06/2019 1655   Last data filed at 08/06/2019 0740     Gross per 24 hour        Intake  480 ml        Output  100 ml        Net  380 ml              Physical Exam:    GENERAL: AOx3, no distress, appears stated age   EYE: conjunctivae/corneas clear. PERRL.   THROAT & NECK: supple,  no JVD.   LUNG: clear to auscultation bilaterally   HEART: tachy, irregular rate and rhythm, S1S2, no murmur, no JVD   ABDOMEN: soft, non-tender, non-distended. Bowel sounds normal.    EXTREMITIES:  No edema, capillary refill.   SKIN: no rash or abnormalities   NEUROLOGIC: AOx3, moving all 4 extremities, speech normal.      Lab/Data Review:   Procedures done this admission:   * No surgery found *        All Micro Results           None                  SARS-CoV-2 Lab Results     "Novel Coronavirus" Test: No results found for: COV2NT     "Emergent Disease" Test: No results found for: EDPR   "SARS-COV-2" Test: No results found for: XGCOVT   "Precision Labs" Test: No results found for: RSLT   Rapid Test: No results found for: COVR                 Labs:  Results:                  BMP, Mg, Phos  Recent Labs         08/06/19   0358  08/05/19   0414  08/04/19   2214  08/04/19   0821  08/04/19   0339      NA  131*  131*  130*    < >  126*      K  4.2  4.2   --    --   4.2       CL  99  100   --    --   94*      CO2  26  27   --    --   26      AGAP  6*  4*   --    --   6*      BUN  19  12   --    --   12      CREA  0.55*  0.57*   --    --   0.58*      CA  7.8*  7.7*   --    --   7.8*      GLU  104*  125*   --    --   109*       < > = values in this interval not displayed.              CBC  Recent Labs         08/06/19   0358  08/05/19   0414  08/04/19   0339      WBC  8.9  8.5  10.7      RBC  2.47*  2.42*  2.64*      HGB  7.5*  7.4*  7.9*      HCT  22.0*  21.2*  22.5*      PLT  208  182  195              LFT  No results for input(s): ALT, TBIL, AP, TP, ALB, GLOB, AGRAT in the last 72 hours.      No lab exists for component: SGOT, GPT     Cardiac Testing  No results found for: BNPP, BNP, CPK, RCK1, RCK2, RCK3, RCK4, CKMB,  CKNDX, CKND1, TROPT, TROIQ        Coagulation Tests  Lab Results      Component  Value  Date/Time        Prothrombin time  13.8  07/30/2019 08:40 AM        Prothrombin time  16.7 (H)  07/14/2019 11:55 AM        INR  1.0  07/30/2019 08:40 AM        INR  1.3  07/14/2019 11:55 AM        aPTT  39.2 (H)  07/30/2019 08:40 AM        aPTT  48.6 (H)  07/14/2019 11:55 AM                 A1c  Lab Results      Component  Value  Date/Time        Hemoglobin A1c  5.5  07/14/2019 11:55 AM              Lipid Panel  Lab Results      Component  Value  Date/Time        Cholesterol, total  132  04/02/2019 04:07 PM        HDL Cholesterol  42  04/02/2019 04:07 PM        LDL, calculated  62  04/02/2019 04:07 PM        VLDL, calculated  28  04/02/2019 04:07 PM        Triglyceride  80  08/03/2019 02:03 PM           Thyroid Panel  Lab Results      Component  Value  Date/Time        TSH  1.100  08/03/2019 02:03 PM        TSH  1.010  04/02/2019 04:07 PM        T4, Free  1.0  08/03/2019 02:03 PM                Most Recent UA  Lab Results      Component  Value  Date/Time        Color  YELLOW  08/03/2019 01:07 PM        Appearance  CLEAR  08/03/2019 01:07 PM        Specific gravity  1.008  08/03/2019  01:07 PM        pH (UA)  6.0  08/03/2019 01:07 PM        Protein  Negative  08/03/2019 01:07 PM        Glucose  Negative  08/03/2019 01:07 PM        Ketone  15 (A)  08/03/2019 01:07 PM        Bilirubin  Negative  08/03/2019 01:07 PM        Blood  Negative  08/03/2019 01:07 PM        Urobilinogen  0.2  08/03/2019 01:07 PM        Nitrites  Negative  08/03/2019 01:07 PM        Leukocyte Esterase  Negative  08/03/2019 01:07 PM                          Signed By:  Scarlett Presto, MD           August 06, 2019

## 2019-08-06 NOTE — Progress Notes (Signed)
Problem: Self Care Deficits Care Plan (Adult)  Goal: *Acute Goals and Plan of Care (Insert Text)  08/05/2019 1214 by Wilber Bihari B, OT  Outcome: Progressing Towards Goal  Note:   1. Patient will perform grooming with stand by assistance seated edge of bed. GOAL MET 08/06/2019  2. Patient will perform Upper body dressing with stand by assistance seated edge of bed  3. Patient will perform bathing with moderate assistance seated edge of bed or on shower chair.   4. Patient will perform toilet transfers with moderate assistance bed to [facility].  5. Patient will participate in 30 + minutes of ADL/ therapeutic exercise/therapeutic activity with min rest breaks to increase activity tolerance for self care.  6. Patient will perform ADL functional mobility in room (bed to chair, bed to [facility]) with moderate assistance.     Goals to be achieved in 7 days.       OCCUPATIONAL THERAPY: Initial Assessment, Daily Note, and AM 08/06/2019  INPATIENT: OT Visit Days: 2  Payor: AARP MEDICARE COMPLETE / Plan: [facility] AARP MEDICARE COMPLETE / Product Type: Managed Care Medicare /      NAME/AGE/GENDER: Brandy Vargas is a 79 y.o. female   PRIMARY DIAGNOSIS:  Acute hyponatremia [E87.1]  ABLA (acute blood loss anemia) [D62]  Acute metabolic encephalopathy [G93.41] Acute hyponatremia Acute hyponatremia       ICD-10: Treatment Diagnosis:    Generalized Muscle Weakness (M62.81)  Other lack of cordination (R27.8)   Precautions/Allergies:     Omeprazole magnesium      ASSESSMENT:     Brandy Vargas presents with with above diagnosis. Pt was discharged on Saturday after left THA. Pt was seen in ICU with PT present. Noted upon entry drainage from left hip dressing, RN notified and assisted pt to roll for dressing change. Pt assisted with upper body sponge off and pt washed her face and assisted with gown change. Pt up to edge of bed and worked on standing and taking side steps. Pt is noted to have significant generalized weakness, decreased activity tolerance  and mobility. Pt also noted to have some confusion. Pt would benefit from OT while in the hospital to maximize safety and independence with self care and functional mobility. Pt would likely benefit from STR as she currently needs quick a bit of assistance.     08/06/2019  Pt was out of ICU and in a regular room. Pt seen with PT due to previous difficulty of mobility. Pt was oriented x 4 today and seems cognitively intact. Pt also moved much better leading the son to consider taking her home. OT will continue plan of care.        This section established at most recent assessment   PROBLEM LIST (Impairments causing functional limitations):  Decreased Strength  Decreased ADL/Functional Activities  Decreased Transfer Abilities  Decreased Ambulation Ability/Technique  Decreased Balance  Increased Pain  Decreased Activity Tolerance  Increased Fatigue  Decreased Cognition   INTERVENTIONS PLANNED: (Benefits and precautions of occupational therapy have been discussed with the patient.)  Activities of daily living training  Adaptive equipment training  Balance training  Cognitive training  Therapeutic activity  Therapeutic exercise     TREATMENT PLAN: Frequency/Duration: Follow patient 3 times per week to address above goals.  Rehabilitation Potential For Stated Goals: Good     REHAB RECOMMENDATIONS (at time of discharge pending progress):    Placement:  It is my opinion, based on this patient's performance to date, that Ms. Kovich may  benefit from intensive therapy at a [facility] after discharge due to the functional deficits listed above that are likely to improve with skilled rehabilitation and concerns that he/she may be unsafe to be unsupervised at home due to confusion and amount of assistance required .  Equipment:   None at this time              OCCUPATIONAL PROFILE AND HISTORY:   History of Present Injury/Illness (Reason for Referral):  See H&P  Past Medical History/Comorbidities:   Brandy Vargas  has  a past medical history of Arthritis, Atrial fibrillation (HCC), Breast cancer (HCC) (2015), COVID-19 vaccine series completed (04/08/2019), GERD (gastroesophageal reflux disease), Hypertension, and Overactive bladder. She also has no past medical history of Malignant hyperthermia due to anesthesia or Pseudocholinesterase deficiency.  Brandy Vargas  has a past surgical history that includes hx appendectomy; hx tubal ligation (1976); pr breast surgery procedure unlisted (Right, 2015); hx knee arthroscopy; and hx breast lumpectomy (Right).  Social History/Living Environment:   Home Environment: Private residence  # Steps to Enter: 5  One/Two Story Residence: One story  Living Alone: Yes  Support Systems: Child(ren)  Patient Expects to be Discharged to:: Unknown  Current DME Used/Available at Home: Environmental consultant, rollator, Medical laboratory scientific officer, straight  Prior Level of Function/Work/Activity:  Independent with all activities of daily living to include driving.      Number of Personal Factors/Comorbidities that affect the Plan of Care: Brief history (0):  LOW COMPLEXITY   ASSESSMENT OF OCCUPATIONAL PERFORMANCE::   Activities of Daily Living:   Basic ADLs (From Assessment) Complex ADLs (From Assessment)   Feeding: Setup  Oral Facial Hygiene/Grooming: Minimum assistance  Bathing: Maximum assistance  Upper Body Dressing: Minimum assistance  Lower Body Dressing: Total assistance  Toileting: Total assistance     Grooming/Bathing/Dressing Activities of Daily Living                             Bed/Mat Mobility  Supine to Sit: Moderate assistance  Sit to Stand: Contact guard assistance  Stand to Sit: Contact guard assistance  Bed to Chair: Contact guard assistance;Minimum assistance  Scooting: Minimum assistance     Most Recent Physical Functioning:   Gross Assessment:                  Posture:     Balance:  Sitting: Intact  Standing: Pull to stand;With support Bed Mobility:  Supine to Sit: Moderate assistance  Scooting: Minimum assistance  Wheelchair  Mobility:     Transfers:  Sit to Stand: Contact guard assistance  Stand to Sit: Contact guard assistance  Bed to Chair: Contact guard assistance;Minimum assistance            Patient Vitals for the past 6 hrs:   BP SpO2 Pulse   08/06/19 0700 105/70 94 % 76   08/06/19 1100 (!) 90/42 94 % 72       Mental Status  Neurologic State: Alert  Orientation Level: Oriented to person, Oriented to time  Cognition: Follows commands  Perception: Appears intact  Perseveration: No perseveration noted  Safety/Judgement: Awareness of environment                          Physical Skills Involved:  Range of Motion  Balance  Activity Tolerance  Pain (acute) Cognitive Skills Affected (resulting in the inability to perform in a timely and safe manner):  Executive  Function  Short Term Recall  Sustained Attention Psychosocial Skills Affected:  Environmental Adaptation   Number of elements that affect the Plan of Care: 5+:  HIGH COMPLEXITY   CLINICAL DECISION MAKING:   [employer] AM-PACT "6 Clicks"   Daily Activity Inpatient Short Form  How much help from another person does the patient currently need... Total A Lot A Little None   1.  Putting on and taking off regular lower body clothing?   [x]  1   []  2   []  3   []  4   2.  Bathing (including washing, rinsing, drying)?   []  1   [x]  2   []  3   []  4   3.  Toileting, which includes using toilet, bedpan or urinal?   [x]  1   []  2   []  3   []  4   4.  Putting on and taking off regular upper body clothing?   []  1   [x]  2   []  3   []  4   5.  Taking care of personal grooming such as brushing teeth?   []  1   []  2   [x]  3   []  4   6.  Eating meals?   []  1   []  2   []  3   [x]  4    2007, Trustees of [address], under license to [place], [place]. All rights reserved      Score:  Initial: 13 Most Recent: X (Date: -- )    Interpretation of Tool:  Represents activities that are increasingly more difficult (i.e. Bed mobility, Transfers, Gait).     Use of outcome tool(s) and clinical judgement create  a POC that gives a: LOW COMPLEXITY         TREATMENT:   (In addition to Assessment/Re-Assessment sessions the following treatments were rendered)     Pre-treatment Symptoms/Complaints:    Pain: Initial:     3 Post Session:  3     Self Care: (25 min): Procedure(s) (per grid) utilized to improve and/or restore self-care/home management as related to grooming and functional mobility. Required minimal verbal and   cueing to facilitate activities of daily living skills and compensatory activities.      Braces/Orthotics/Lines/Etc:   O2 Device: None (Room air)  Treatment/Session Assessment:    Response to Treatment:  pt tolerated fair.   Interdisciplinary Collaboration:   Physical Therapist  Occupational Therapist  Registered Nurse  Social Worker  After treatment position/precautions:   Up in chair  Bed/Chair-wheels locked  Call light within reach  RN notified  Family at bedside   Compliance with Program/Exercises: Compliant all of the time, Will assess as treatment progresses.  Recommendations/Intent for next treatment session:  "Next visit will focus on advancements to more challenging activities and reduction in assistance provided".  Total Treatment Duration:  OT Patient Time In/Time Out  Time In: 1050  Time Out: 1115     Loralee Pacas, OT

## 2019-08-06 NOTE — Progress Notes (Signed)
 Problem: Mobility Impaired (Adult and Pediatric)  Goal: *Acute Goals and Plan of Care (Insert Text)  Outcome: Progressing Towards Goal  Note: STG:  (1.)Brandy Vargas will move from supine to sit and sit to supine  with MODERATE ASSIST within 5 treatment day(s).    (2.)Brandy Vargas will transfer from bed to chair and chair to bed with MODERATE ASSIST using the least restrictive device within 5 treatment day(s).  Met 08/06/19  (3.)Brandy Vargas will ambulate with MODERATE ASSIST for 10 feet with the least restrictive device within 5 treatment day(s).  Met 08/06/19     LTG:  (1.)Brandy Vargas will move from supine to sit and sit to supine  in bed with CONTACT GUARD ASSIST within 10 treatment day(s).    (2.)Brandy Vargas will transfer from bed to chair and chair to bed with CONTACT GUARD ASSIST using the least restrictive device within 10 treatment day(s).    (3.)Brandy Vargas will ambulate with CONTACT GUARD ASSIST for 50 feet with the least restrictive device within 10 treatment day(s).  NEW GOAL:  (4.)Brandy Vargas will ambulate up/down 5 steps with B HRs and CGA within 5 treatment days.  ________________________________________________________________________________________________       PHYSICAL THERAPY: Daily Note and AM 08/06/2019  INPATIENT: PT Visit Days : 2  Payor: AARP MEDICARE COMPLETE / Plan: [facility] AARP MEDICARE COMPLETE / Product Type: Managed Care Medicare /       NAME/AGE/GENDER: Brandy Vargas is a 79 y.o. female   PRIMARY DIAGNOSIS: Acute hyponatremia [E87.1]  ABLA (acute blood loss anemia) [D62]  Acute metabolic encephalopathy [G93.41] Acute hyponatremia Acute hyponatremia       ICD-10: Treatment Diagnosis:    Generalized Muscle Weakness (M62.81)  Other lack of cordination (R27.8)  Difficulty in walking, Not elsewhere classified (R26.2)   Precaution/Allergies:  Omeprazole magnesium      ASSESSMENT:     Brandy Vargas presents with above diagnoses.  Patient underwent L THA on 07/30/19.  She d/c'd home with assist from  her son on 08/01/19.  At time of d/c, she was ambulatory with SBA and was able to negotiate steps with B rails.  Son reports patient did not do well once she returned home with reports of weakness and inability to walk.  She also developed significant confusion.  Patient is currently functioning well below baseline and below level of function at time of d/c on 7/31.  She would benefit from therapy to address her function and mobility.  Expect patient will need SNF at d/c for additional rehab.      Patient alert and oriented today.  Back to baseline cognition wise.  Did need mod A for supine to sit but much less assist for sit <> stand and able to ambulate in the room.  Patient wanting to d/c home and son interested in home with [facility].  Suggested we get back to where she was Saturday-ambulating with SBA and able to negotiate 5 steps with B rails.      This section established at most recent assessment   PROBLEM LIST (Impairments causing functional limitations):  Decreased Strength  Decreased ADL/Functional Activities  Decreased Transfer Abilities  Decreased Ambulation Ability/Technique  Decreased Balance  Increased Pain  Decreased Activity Tolerance  Decreased Flexibility/Joint Mobility  Edema/Girth  Decreased Knowledge of Precautions  Decreased Independence with Home Exercise Program  Decreased Cognition   INTERVENTIONS PLANNED: (Benefits and precautions of physical therapy have been discussed with the patient.)  Balance Exercise  Bed Mobility  Family Education  Gait Training  Home Exercise Program (HEP)  Therapeutic Activites  Therapeutic Exercise/Strengthening  Transfer Training     TREATMENT PLAN: Frequency/Duration: daily for duration of hospital stay  Rehabilitation Potential For Stated Goals: Good     REHAB RECOMMENDATIONS (at time of discharge pending progress):    Placement:  It is my opinion, based on this patient's performance to date, that Brandy Vargas may benefit from intensive therapy at a [facility]  FACILITY after discharge due to the functional deficits listed above that are likely to improve with skilled rehabilitation and concerns that he/she may be unsafe to be unsupervised at home due to confusion .  Equipment:   None at this time              HISTORY:   History of Present Injury/Illness (Reason for Referral):  Brandy Vargas is a 79 year old CF with a PMH of PAF, HTN, GERD, and h/o breast CA who presented to the ER with 3-4 days of nausea and vomiting and 24 hours of waxing and waning confusion and hallucinating. She had a left THA on 07/30/19 and did well with the surgery. Per the son, the patient was nauseated and had some vomiting post-op. She continued to have this, but was discharged on 08/01/19. The patient went home and continued to have vomiting and was unable to hold anything down. Then on the evening of 08/02/19 she became confused and was seeing ants on her walls. Her son got her comfortable in the chair and she fell asleep. On the morning of 08/03/19 he found the patient in the floor so he called EMS. Complains of indigestion. Denies CP/SOB. Denies dizziness. Denies diarrhea. Denies abdominal pain.  Past Medical History/Comorbidities:   Brandy Vargas  has a past medical history of Arthritis, Atrial fibrillation (HCC), Breast cancer (HCC) (2015), COVID-19 vaccine series completed (04/08/2019), GERD (gastroesophageal reflux disease), Hypertension, and Overactive bladder. She also has no past medical history of Malignant hyperthermia due to anesthesia or Pseudocholinesterase deficiency.  Brandy Vargas  has a past surgical history that includes hx appendectomy; hx tubal ligation (1976); pr breast surgery procedure unlisted (Right, 2015); hx knee arthroscopy; and hx breast lumpectomy (Right).  Social History/Living Environment:   Home Environment: Private residence  # Steps to Enter: 5  One/Two Story Residence: One story  Living Alone: Yes  Support Systems: Child(ren)  Patient Expects to be Discharged to::  Unknown  Current DME Used/Available at Home: Environmental consultant, rollator, [place], straight  Prior Level of Function/Work/Activity:  THA 07/30/19.  Independent prior to THA.     Number of Personal Factors/Comorbidities that affect the Plan of Care: 1-2: MODERATE COMPLEXITY   EXAMINATION:   Most Recent Physical Functioning:   Gross Assessment:  AROM: Generally decreased, functional  Strength: Generally decreased, functional  Coordination: Generally decreased, functional               Posture:     Balance:  Sitting: Intact  Standing: Pull to stand;With support Bed Mobility:  Supine to Sit: Moderate assistance  Scooting: Minimum assistance  Wheelchair Mobility:     Transfers:  Sit to Stand: Contact guard assistance  Stand to Sit: Contact guard assistance  Bed to Chair: Contact guard assistance;Minimum assistance  Gait:  Left Side Weight Bearing: As tolerated  Speed/Cadence: Pace decreased (<100 feet/min)  Step Length: Left shortened;Right shortened  Stance: Left decreased  Gait Abnormalities: Antalgic;Decreased step clearance  Distance (ft): 15 Feet (ft)  Assistive Device: Walker, rolling  Ambulation - Level  of Assistance: Contact guard assistance;Minimal assistance  Interventions: Safety awareness training;Verbal cues      Body Structures Involved:  Joints  Muscles Body Functions Affected:  Movement Related Activities and Participation Affected:  Mobility   Number of elements that affect the Plan of Care: 4+: HIGH COMPLEXITY   CLINICAL PRESENTATION:   Presentation: Evolving clinical presentation with changing clinical characteristics: MODERATE COMPLEXITY   CLINICAL DECISION MAKING:   [employer] AM-PACT "6 Clicks"   Basic Mobility Inpatient Short Form  How much difficulty does the patient currently have... Unable A Lot A Little None   1.  Turning over in bed (including adjusting bedclothes, sheets and blankets)?   []  1   [x]  2   []  3   []  4   2.  Sitting down on and standing up from a chair with arms ( e.g., wheelchair,  bedside commode, etc.)   []  1   [x]  2   []  3   []  4   3.  Moving from lying on back to sitting on the side of the bed?   []  1   [x]  2   []  3   []  4   How much help from another person does the patient currently need... Total A Lot A Little None   4.  Moving to and from a bed to a chair (including a wheelchair)?   []  1   [x]  2   []  3   []  4   5.  Need to walk in hospital room?   []  1   [x]  2   []  3   []  4   6.  Climbing 3-5 steps with a railing?   [x]  1   []  2   []  3   []  4    2007, Trustees of [address], under license to [place], [place]. All rights reserved      Score:  Initial: 11 Most Recent: X (Date: -- )    Interpretation of Tool:  Represents activities that are increasingly more difficult (i.e. Bed mobility, Transfers, Gait).    Medical Necessity:     Patient is expected to demonstrate progress in   strength, range of motion, balance, coordination, and functional technique   to   decrease assistance required with mobility.  .  Reason for Services/Other Comments:  Patient continues to require skilled intervention due to   Weakness and confusion limiting mobility.  .   Use of outcome tool(s) and clinical judgement create a POC that gives a: Questionable prediction of patient's progress: MODERATE COMPLEXITY            TREATMENT:   (In addition to Assessment/Re-Assessment sessions the following treatments were rendered)   Pre-treatment Symptoms/Complaints:  Patient agreeable.  Pain: Initial:   Pain Intensity 1: 4  Post Session:  4     Therapeutic Activity: (    30 minutes):  Therapeutic activities including Bed transfers, chair transfers, Ambulation on level ground, and LE exercises as below to improve mobility, strength, balance, and coordination.  Required moderate Safety awareness training;Verbal cues to promote static and dynamic balance in sitting and standing and promote coordination of bilateral, lower extremity(s).      Date:  08/06/19 Date:   Date:     Activity/Exercise Parameters Parameters Parameters    Ankle pumps 15 B     Quad sets 15 B     Gluteal squeeze 15     Hip abduction/adduction 15-AA     Heel slides 15-AA  Short arc quad 15     Long arc quad 15           Braces/Orthotics/Lines/Etc:   Telemetry and purewick  O2 Device: None (Room air)  Treatment/Session Assessment:    Response to Treatment:  Patient alert and oriented today with much better mobility.  Interdisciplinary Collaboration:   Physical Therapist  Occupational Therapist  Registered Nurse  After treatment position/precautions:   Up in chair  Bed/Chair-wheels locked  Call light within reach  RN notified  Family at bedside   Compliance with Program/Exercises: Will assess as treatment progresses  Recommendations/Intent for next treatment session:  Next visit will focus on advancements to more challenging activities and reduction in assistance provided.  Total Treatment Duration:  PT Patient Time In/Time Out  Time In: 1100  Time Out: 1130    Dwayne JINNY Fuelling, PT

## 2019-08-06 NOTE — Progress Notes (Signed)
 Outreach Follow Up Note          MEWS Score: 1 (08/05/19 1704)  Vitals:    08/05/19 1600 08/05/19 1704 08/05/19 1939 08/05/19 2326   BP: (!) 94/42 122/68 111/72 96/61   Pulse: 71 85 83 73   Resp: 13 18 18 18    Temp:  98 F (36.7 C) 97.8 F (36.6 C) 98 F (36.7 C)   SpO2: 98% 91% 94% 93%   Weight:       Height:             Pain Assessment  Pain Intensity 1: 0 (08/05/19 1939)  Pain Location 1: Hip  Pain Intervention(s) 1: Medication (see MAR), Repositioned  Patient Stated Pain Goal: 0      Patient reviewed and discussed with primary nurse Mikala.  There have been no significant clinical changes since the completion of the last dated Outreach assessment.Patient observed to be restful in bed with eyes closed, even non-labored respirations. Patient allowed to remain at rest. Primary RN aware outreach remains available with any needs.     Will continue to follow up per outreach protocol.    Signed By:   Belvie KATHEE Shown, RN    August 06, 2019 4:07 AM

## 2019-08-07 LAB — BASIC METABOLIC PANEL
Anion Gap: 4 mmol/L — ABNORMAL LOW (ref 7–16)
Anion Gap: 5 mmol/L — ABNORMAL LOW (ref 7–16)
BUN: 25 MG/DL — ABNORMAL HIGH (ref 8–23)
BUN: 21 MG/DL (ref 8–23)
CO2: 28 mmol/L (ref 21–32)
CO2: 28 mmol/L (ref 21–32)
Calcium: 7.5 MG/DL — ABNORMAL LOW (ref 8.3–10.4)
Calcium: 7.9 MG/DL — ABNORMAL LOW (ref 8.3–10.4)
Chloride: 98 mmol/L (ref 98–107)
Chloride: 100 mmol/L (ref 98–107)
Creatinine: 0.73 MG/DL (ref 0.6–1.0)
Creatinine: 0.65 MG/DL (ref 0.6–1.0)
EGFR IF NonAfrican American: >60 mL/min/{1.73_m2} (ref >60)
EGFR IF NonAfrican American: >60 mL/min/{1.73_m2} (ref >60)
GFR African American: >60 mL/min/{1.73_m2} (ref >60)
GFR African American: >60 mL/min/{1.73_m2} (ref >60)
Glucose: 106 mg/dL — ABNORMAL HIGH (ref 65–100)
Glucose: 101 mg/dL — ABNORMAL HIGH (ref 65–100)
Potassium: 4.5 mmol/L (ref 3.5–5.1)
Potassium: 4.3 mmol/L (ref 3.5–5.1)
Sodium: 130 mmol/L — ABNORMAL LOW (ref 136–145)
Sodium: 133 mmol/L — ABNORMAL LOW (ref 136–145)

## 2019-08-07 LAB — TYPE AND SCREEN
ABO/Rh: B POS
Antibody Screen: NEGATIVE
Status: TRANSFUSED
Unit Divison: 0
Unit Divison: 0

## 2019-08-07 LAB — TRANSFERRIN SATURATION
Iron: 33 ug/dL — ABNORMAL LOW (ref 35–150)
Iron: 33 ug/dL — ABNORMAL LOW (ref 35–150)
TIBC: 157 ug/dL — ABNORMAL LOW (ref 250–450)
TIBC: 157 ug/dL — ABNORMAL LOW (ref 250–450)
TRANSFERRIN SATURATION: 21 %
Transferrin Saturation: 21 %

## 2019-08-07 LAB — CBC
Hematocrit: 23.6 % — ABNORMAL LOW (ref 35.8–46.3)
Hemoglobin: 7.9 g/dL — ABNORMAL LOW (ref 11.7–15.4)
MCH: 30.2 PG (ref 26.1–32.9)
MCHC: 33.5 g/dL (ref 31.4–35.0)
MCV: 90.1 FL (ref 79.6–97.8)
MPV: 9.1 FL — ABNORMAL LOW (ref 9.4–12.3)
NRBC Absolute: 0 10*3/uL (ref 0.0–0.2)
Platelets: 261 10*3/uL (ref 150–450)
RBC: 2.62 M/uL — ABNORMAL LOW (ref 4.05–5.2)
RDW: 14.4 % (ref 11.9–14.6)
WBC: 11.1 10*3/uL (ref 4.3–11.1)

## 2019-08-07 LAB — FOLATE
Folate: 9 ng/mL (ref 3.1–17.5)
Folate: 9 ng/mL (ref 3.1–17.5)

## 2019-08-07 LAB — FERRITIN
Ferritin: 114 NG/ML (ref 8–388)
Ferritin: 114 NG/ML (ref 8–388)

## 2019-08-07 LAB — VITAMIN B12
Vitamin B-12: 592 pg/mL (ref 193–986)
Vitamin B12: 592 pg/mL (ref 193–986)

## 2019-08-07 LAB — METABOLIC PANEL, BASIC
Anion gap: 4 mmol/L — ABNORMAL LOW (ref 7–16)
Anion gap: 5 mmol/L — ABNORMAL LOW (ref 7–16)
BUN: 25 MG/DL — ABNORMAL HIGH (ref 8–23)
BUN: 21 MG/DL (ref 8–23)
CO2: 28 mmol/L (ref 21–32)
CO2: 28 mmol/L (ref 21–32)
Calcium: 7.5 MG/DL — ABNORMAL LOW (ref 8.3–10.4)
Calcium: 7.9 MG/DL — ABNORMAL LOW (ref 8.3–10.4)
Chloride: 98 mmol/L (ref 98–107)
Chloride: 100 mmol/L (ref 98–107)
Creatinine: 0.73 MG/DL (ref 0.6–1.0)
Creatinine: 0.65 MG/DL (ref 0.6–1.0)
GFR est AA: >60 mL/min/{1.73_m2} (ref >60)
GFR est AA: >60 mL/min/{1.73_m2} (ref >60)
GFR est non-AA: >60 mL/min/{1.73_m2} (ref >60)
GFR est non-AA: >60 mL/min/{1.73_m2} (ref >60)
Glucose: 106 mg/dL — ABNORMAL HIGH (ref 65–100)
Glucose: 101 mg/dL — ABNORMAL HIGH (ref 65–100)
Potassium: 4.5 mmol/L (ref 3.5–5.1)
Potassium: 4.3 mmol/L (ref 3.5–5.1)
Sodium: 130 mmol/L — ABNORMAL LOW (ref 136–145)
Sodium: 133 mmol/L — ABNORMAL LOW (ref 136–145)

## 2019-08-07 LAB — TYPE & SCREEN
ABO/Rh(D): B POS
Antibody screen: NEGATIVE
Status of unit: TRANSFUSED
Unit division: 0
Unit division: 0

## 2019-08-07 LAB — CBC W/O DIFF
ABSOLUTE NRBC: 0 10*3/uL (ref 0.0–0.2)
HCT: 23.6 % — ABNORMAL LOW (ref 35.8–46.3)
HGB: 7.9 g/dL — ABNORMAL LOW (ref 11.7–15.4)
MCH: 30.2 PG (ref 26.1–32.9)
MCHC: 33.5 g/dL (ref 31.4–35.0)
MCV: 90.1 FL (ref 79.6–97.8)
MPV: 9.1 FL — ABNORMAL LOW (ref 9.4–12.3)
PLATELET: 261 10*3/uL (ref 150–450)
RBC: 2.62 M/uL — ABNORMAL LOW (ref 4.05–5.2)
RDW: 14.4 % (ref 11.9–14.6)
WBC: 11.1 10*3/uL (ref 4.3–11.1)

## 2019-08-07 MED ORDER — APIXABAN 5 MG TABLET
5 mg | Freq: Two times a day (BID) | ORAL | Status: DC
Start: 2019-08-07 — End: 2019-08-12
  Administered 2019-08-07 – 2019-08-12 (×11): via ORAL

## 2019-08-07 MED ORDER — VALSARTAN 160 MG TAB
160 mg | Freq: Every day | ORAL | Status: DC
Start: 2019-08-07 — End: 2019-08-12
  Administered 2019-08-07 – 2019-08-12 (×6): via ORAL

## 2019-08-07 MED ORDER — LIP PROTECTANT 0.6 %-0.5 %-1 %-0.5 % OINTMENT
CUTANEOUS | Status: DC | PRN
Start: 2019-08-07 — End: 2019-08-12
  Administered 2019-08-07: 14:00:00 via TOPICAL

## 2019-08-07 MED FILL — ASPIRIN 81 MG CHEWABLE TAB: 81 mg | ORAL | Qty: 1

## 2019-08-07 MED FILL — ELIQUIS 5 MG TABLET: 5 mg | ORAL | Qty: 1

## 2019-08-07 MED FILL — VALSARTAN 160 MG TAB: 160 mg | ORAL | Qty: 1

## 2019-08-07 MED FILL — LIP PROTECTANT 0.6 %-0.5 %-1 %-0.5 % OINTMENT: CUTANEOUS | Qty: 1

## 2019-08-07 MED FILL — VALSARTAN 320 MG TAB: 320 mg | ORAL | Qty: 1

## 2019-08-07 MED FILL — FAMOTIDINE 20 MG TAB: 20 mg | ORAL | Qty: 1

## 2019-08-07 MED FILL — SUCRALFATE 1 GRAM TAB: 1 gram | ORAL | Qty: 1

## 2019-08-07 MED FILL — ELIQUIS 2.5 MG TABLET: 2.5 mg | ORAL | Qty: 1

## 2019-08-07 MED FILL — ATORVASTATIN 10 MG TAB: 10 mg | ORAL | Qty: 1

## 2019-08-07 MED FILL — METOPROLOL TARTRATE 50 MG TAB: 50 mg | ORAL | Qty: 1

## 2019-08-07 MED FILL — STOOL SOFTENER-STIMULANT LAXATIVE 8.6 MG-50 MG TABLET: ORAL | Qty: 2

## 2019-08-07 MED FILL — TROSPIUM 20 MG TAB: 20 mg | ORAL | Qty: 1

## 2019-08-07 NOTE — Progress Notes (Signed)
CM spoke with patient's son, Lorin Picket. Son would like to speak with therapy more today, but is currently interested in STR at discharge. He requests referral to [facility]; reviewing list for other choices, if bed not available. CM placed referral. CM instructed son that patient's insurance will require precert when bed is located. Patient has had PPD. COVID screen required prior to discharge. CM will follow to assist with discharge needs.

## 2019-08-07 NOTE — Progress Notes (Signed)
 Problem: Self Care Deficits Care Plan (Adult)  Goal: *Acute Goals and Plan of Care (Insert Text)  08/05/2019 1214 by Delores Grizzle B, OT  Outcome: Progressing Towards Goal  Note:   1. Patient will perform grooming with stand by assistance seated edge of bed. GOAL MET 08/06/2019  2. Patient will perform Upper body dressing with stand by assistance seated edge of bed  3. Patient will perform bathing with moderate assistance seated edge of bed or on shower chair.   4. Patient will perform toilet transfers with moderate assistance bed to [facility].  5. Patient will participate in 30 + minutes of ADL/ therapeutic exercise/therapeutic activity with min rest breaks to increase activity tolerance for self care.  6. Patient will perform ADL functional mobility in room (bed to chair, bed to [facility]) with moderate assistance.     Goals to be achieved in 7 days.       OCCUPATIONAL THERAPY: Daily Note and AM 08/07/2019  INPATIENT: OT Visit Days: 3  Payor: AARP MEDICARE COMPLETE / Plan: [facility] AARP MEDICARE COMPLETE / Product Type: Managed Care Medicare /      NAME/AGE/GENDER: Brandy Vargas is a 79 y.o. female   PRIMARY DIAGNOSIS:  Acute hyponatremia [E87.1]  ABLA (acute blood loss anemia) [D62]  Acute metabolic encephalopathy [G93.41] Acute hyponatremia Acute hyponatremia       ICD-10: Treatment Diagnosis:    Generalized Muscle Weakness (M62.81)  Other lack of cordination (R27.8)   Precautions/Allergies:     Omeprazole magnesium      ASSESSMENT:     Brandy Vargas presents with with above diagnosis. Pt was discharged on Saturday after left THA. Pt was seen in ICU with PT present. Noted upon entry drainage from left hip dressing, RN notified and assisted pt to roll for dressing change. Pt assisted with upper body sponge off and pt washed her face and assisted with gown change. Pt up to edge of bed and worked on standing and taking side steps. Pt is noted to have significant generalized weakness, decreased activity tolerance and mobility. Pt  also noted to have some confusion. Pt would benefit from OT while in the hospital to maximize safety and independence with self care and functional mobility. Pt would likely benefit from STR as she currently needs quick a bit of assistance.     08/06/2019  Pt was out of ICU and in a regular room. Pt seen with PT due to previous difficulty of mobility. Pt was oriented x 4 today and seems cognitively intact. Pt also moved much better leading the son to consider taking her home. OT will continue plan of care.      08/07/2019  Pt was seen in room with son present. Pt continue to make progress with mobility and self care.  Pt up in room with walker, to bathroom for grooming and self care seated at sink. Pt make to recliner and left with son present. Pt tolerated well will continue plan of care.         This section established at most recent assessment   PROBLEM LIST (Impairments causing functional limitations):  Decreased Strength  Decreased ADL/Functional Activities  Decreased Transfer Abilities  Decreased Ambulation Ability/Technique  Decreased Balance  Increased Pain  Decreased Activity Tolerance  Increased Fatigue  Decreased Cognition   INTERVENTIONS PLANNED: (Benefits and precautions of occupational therapy have been discussed with the patient.)  Activities of daily living training  Adaptive equipment training  Balance training  Cognitive training  Therapeutic activity  Therapeutic exercise     TREATMENT PLAN: Frequency/Duration: Follow patient 3 times per week to address above goals.  Rehabilitation Potential For Stated Goals: Good     REHAB RECOMMENDATIONS (at time of discharge pending progress):    Placement:  It is my opinion, based on this patient's performance to date, that Brandy Vargas may benefit from intensive therapy at a [facility] FACILITY after discharge due to the functional deficits listed above that are likely to improve with skilled rehabilitation and concerns that he/she may be unsafe to be  unsupervised at home due to confusion and amount of assistance required .  Equipment:   None at this time              OCCUPATIONAL PROFILE AND HISTORY:   History of Present Injury/Illness (Reason for Referral):  See H&P  Past Medical History/Comorbidities:   Brandy Vargas  has a past medical history of Arthritis, Atrial fibrillation (HCC), Breast cancer (HCC) (2015), COVID-19 vaccine series completed (04/08/2019), GERD (gastroesophageal reflux disease), Hypertension, and Overactive bladder. She also has no past medical history of Malignant hyperthermia due to anesthesia or Pseudocholinesterase deficiency.  Brandy Vargas  has a past surgical history that includes hx appendectomy; hx tubal ligation (1976); pr breast surgery procedure unlisted (Right, 2015); hx knee arthroscopy; and hx breast lumpectomy (Right).  Social History/Living Environment:   Home Environment: Private residence  # Steps to Enter: 5  One/Two Story Residence: One story  Living Alone: Yes  Support Systems: Child(ren)  Patient Expects to be Discharged to:: Unknown  Current DME Used/Available at Home: Environmental consultant, rollator, Medical laboratory scientific officer, straight  Prior Level of Function/Work/Activity:  Independent with all activities of daily living to include driving.      Number of Personal Factors/Comorbidities that affect the Plan of Care: Brief history (0):  LOW COMPLEXITY   ASSESSMENT OF OCCUPATIONAL PERFORMANCE::   Activities of Daily Living:   Basic ADLs (From Assessment) Complex ADLs (From Assessment)   Feeding: Setup  Oral Facial Hygiene/Grooming: Minimum assistance  Bathing: Maximum assistance  Upper Body Dressing: Minimum assistance  Lower Body Dressing: Total assistance  Toileting: Total assistance     Grooming/Bathing/Dressing Activities of Daily Living   Grooming  Grooming Assistance: Stand-by assistance (seated on chair at sink) Cognitive Retraining  Safety/Judgement: Awareness of environment                 Functional Transfers  Bathroom Mobility: Minimum  assistance  Toilet Transfer : Minimum assistance  Cues: Verbal cues provided  Adaptive Equipment: Grab bars;Walker (comment)           Most Recent Physical Functioning:   Gross Assessment:                  Posture:     Balance:  Sitting: Intact  Standing: Pull to stand;With support Bed Mobility:     Wheelchair Mobility:     Transfers:               Patient Vitals for the past 6 hrs:   BP BP Patient Position SpO2 Pulse   08/07/19 1153 94/75 Sitting 97 % 69       Mental Status  Neurologic State: Alert  Orientation Level: Oriented X4  Cognition: Appropriate for age attention/concentration  Perception: Appears intact  Perseveration: No perseveration noted  Safety/Judgement: Awareness of environment                          Physical Skills Involved:  Range of Motion  Balance  Activity Tolerance  Pain (acute) Cognitive Skills Affected (resulting in the inability to perform in a timely and safe manner):  Executive Function  Short Term Recall  Sustained Attention Psychosocial Skills Affected:  Environmental Adaptation   Number of elements that affect the Plan of Care: 5+:  HIGH COMPLEXITY   CLINICAL DECISION MAKING:   [employer] AM-PACT "6 Clicks"   Daily Activity Inpatient Short Form  How much help from another person does the patient currently need... Total A Lot A Little None   1.  Putting on and taking off regular lower body clothing?   [x]  1   []  2   []  3   []  4   2.  Bathing (including washing, rinsing, drying)?   []  1   [x]  2   []  3   []  4   3.  Toileting, which includes using toilet, bedpan or urinal?   [x]  1   []  2   []  3   []  4   4.  Putting on and taking off regular upper body clothing?   []  1   [x]  2   []  3   []  4   5.  Taking care of personal grooming such as brushing teeth?   []  1   []  2   [x]  3   []  4   6.  Eating meals?   []  1   []  2   []  3   [x]  4    2007, Trustees of [address], under license to [place], [place]. All rights reserved      Score:  Initial: 13 Most Recent: X (Date: -- )     Interpretation of Tool:  Represents activities that are increasingly more difficult (i.e. Bed mobility, Transfers, Gait).     Use of outcome tool(s) and clinical judgement create a POC that gives a: LOW COMPLEXITY         TREATMENT:   (In addition to Assessment/Re-Assessment sessions the following treatments were rendered)     Pre-treatment Symptoms/Complaints:    Pain: Initial:     3 Post Session:  3     Self Care: (40 min): Procedure(s) (per grid) utilized to improve and/or restore self-care/home management as related to toileting, grooming and functional mobility . Required moderate verbal and   cueing to facilitate activities of daily living skills and compensatory activities.  .      Braces/Orthotics/Lines/Etc:   O2 Device: None (Room air)  Treatment/Session Assessment:    Response to Treatment:  pt tolerated fair.   Interdisciplinary Collaboration:   Physical Therapist  Occupational Therapist  Registered Nurse  Social Worker  After treatment position/precautions:   Up in chair  Bed/Chair-wheels locked  Call light within reach  RN notified  Family at bedside   Compliance with Program/Exercises: Compliant all of the time, Will assess as treatment progresses.  Recommendations/Intent for next treatment session:  Next visit will focus on advancements to more challenging activities and reduction in assistance provided.  Total Treatment Duration:  OT Patient Time In/Time Out  Time In: 1010  Time Out: 1050     Reva KATHEE Daring, OT

## 2019-08-07 NOTE — Progress Notes (Signed)
Progress  Notes by Scarlett Presto, MD at 08/07/19 1255                Author: Scarlett Presto, MD  Service: Internal Medicine  Author Type: Physician       Filed: 08/07/19 1258  Date of Service: 08/07/19 1255  Status: Signed          Editor: Scarlett Presto, MD (Physician)                         [employer] Hospitalist Progress Note          Patient: Brandy Vargas  MRN: [id number]   SSN: [id number]          Date of Birth: 1940-11-25   Age: 79 y.o.   Sex: female         Admit Date: 08/03/2019     LOS: 4 days         Subjective:        Chief Complaint: Confusion   Reason for Admission: Acute hyponatremia      79 year old CF with a PMH of PAF, HTN, GERD, and h/o breast CA who presented to the ER with 3-4 days of nausea and vomiting and 24 hours of waxing and waning confusion  and hallucinating. She had a left THA on 07/30/19 and did well with the surgery. Per the son, the patient was nauseated and had some vomiting post-op. She continued to have this, but was discharged on 08/01/19. The patient went home and continued to have  vomiting and was unable to hold anything down. Then on the evening of 08/02/19 she became confused and was seeing ants on her walls. Her son got her comfortable in the chair and she fell asleep. On the morning of 08/03/19 he found the patient in the floor  so he called EMS. She was admitted for sodium 117 and anemia. She was transfused 1U PRBC. She became acutely confused and has persisted.      8/4 - She continues to be confused this AM. Not agitated, but hallucinating and not sure what she's saying. Thinks she is at the "[facility] Reaching for her non-existent keys on her table. Says she slept well (didn't sleep much last night  per nursing). Denies CP/SOB. Denies N/V/D.      August 5: Patient is AOx3 today.  Eating her breakfast.  Son was present at bedside and as per son patient has started around 180 degrees.  Patient denies any nausea, vomiting, diarrhea, chest pain or shortness of  breath.      August 6: Patient is AOx3.  She ate her dinner and breakfast yesterday.  Patient son at bedside.  Denies any nausea, vomiting, chest pain or shortness of breath.  No bowel movement in last 1 week but no abdominal discomfort.      Review of systems negative except stated above.        Assessment:        Primary Diagnosis: Acute hyponatremia         Hospital Problems  as of 08/07/2019  Date Reviewed:  08-06-19                     Codes  Class  Noted - Resolved  POA              * (Principal) Acute hyponatremia  ICD-10-CM: E87.1   ICD-9-CM: 276.1    08/03/2019 - Present  Yes                        ABLA (acute blood loss anemia)  ICD-10-CM: D62   ICD-9-CM: 285.1    08/03/2019 - Present  Yes                        History of total hip arthroplasty, left  ICD-10-CM: J19.147   ICD-9-CM: V43.64    08/03/2019 - Present  Yes                        Leukocytosis  ICD-10-CM: D72.829   ICD-9-CM: 288.60    08/03/2019 - Present  Yes                        Pure hyperglyceridemia  ICD-10-CM: E78.1   ICD-9-CM: 272.1    04/28/2019 - Present  Yes                        Paroxysmal atrial fibrillation (HCC)  ICD-10-CM: I48.0   ICD-9-CM: 427.31    04/28/2019 - Present  Yes                        Essential hypertension  ICD-10-CM: I10   ICD-9-CM: 401.9    04/28/2019 - Present  Yes                        RESOLVED: Acute metabolic encephalopathy  ICD-10-CM: G93.41   ICD-9-CM: 348.31    08/03/2019 - 08/06/2019  Yes                        RESOLVED: Nausea and vomiting  ICD-10-CM: R11.2   ICD-9-CM: 787.01    08/03/2019 - 08/06/2019  Yes                            Active Medical Issues and Plan (MDM):        Principal Problem:     Acute hyponatremia (08/03/2019)   - Sodium 138 (07/14/19) --> 117 (08/03/19)   - Currently 117 --> 126 --> 131   - Likely due to vomiting and dehydration   - Stop normal saline   - UA with ketones, otherwise normal   - Urine sodium 17 + osm 236   - Serum osm 246   - TFTs unremarkable   - Triglycerides 80   - Trend sodium Q6H   -  Strict I/O   ??   August 5: Sodium stable.  Patient is AOx3.  Discussed with the patient and patient's son to increase p.o. intake.  Will monitor.   Likely secondary to dehydration secondary to nausea vomiting.      Problem 6: Stable.  Will monitor.      Active Problems:     Atrial Fibrillation with RVR (08/04/2019)   - The patient flipped into Afib RVR - now back in NSR   - Off Cardizem gtt   - Continue Lopressor 50mg  BID   - Continue Eliquis 2.5 BID   - Give Lopressor IV 5mg  PRN if BP permits            August 6: Discussed with patient and patient's son.  Patient creatinine is less than 1.5 and weight is more  than 60 kg.  Patient was taking Eliquis 5 mg twice daily at home.  Risk and benefit discussed and family wants patient to take Eliquis 5 mg twice  daily.  Does not have any stent in her body so we will stop aspirin.  Family in agreement.   Next  ABLA (acute blood loss anemia) (08/03/2019)   - Likely due to left THA   - Hgb 7.4 at discharge --> admit 6.6 --> 1U PRBC --> 7.9 --> 7.4   - Continue Eliquis due to flipping in/out of Afib   - Transfuse Hgb <7.0   ??   August 5: As patient received blood transfusion during hospitalization so we will check iron studies, B12 and folate level tomorrow morning.      August 6: Will monitor.        Acute metabolic encephalopathy (08/03/2019)   - Worse overnight   - Pleasantly confused   - Continue Seroquel 100mg  QHS   - Likely due to hyponatremia + delirium   - UA unremarkable   - TFTs unremarkable   ??   August 5: Resolved.  Will change Seroquel to as needed.        Nausea and vomiting (08/03/2019)   - Resolved   - Unsure etiology --> has been happening since post-op per son   - No abdominal pain   - Start Zofran PRN   - Start Phenergan PRN   ??   August 5: Resolved.        Leukocytosis (08/03/2019)   - I suspect this is reactive   - Resolved   - Decrease normal saline   - UA unremarkable   August 5: Resolved.        Essential hypertension (04/28/2019)   - Stable   - Continue  Lopressor   - Continue ARB   ??   August 6: given hypotension, will decrease the dose of ARB into half.        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     Paroxysmal atrial fibrillation (HCC) (04/28/2019)   - Currently NSR   - Continue Lopressor   - Continue low dose Eliquis      Otherwise all chronic medical issues appear stable with no changes.      Diet: ADULT ORAL NUTRITION SUPPLEMENT Breakfast; Clear Liquid   ADULT ORAL NUTRITION SUPPLEMENT Lunch; Standard High Calorie/High Protein   ADULT ORAL NUTRITION SUPPLEMENT Dinner; Frozen Supplement   ADULT DIET Regular   VTE ppx: Eliquis      Patient is AOx3.  Her son was present at bedside [Scott].  I was informed that patient and family wants patient to go to rehab.  Social worker working on placement.        Objective:        Visit Vitals      BP  94/75 (BP 1 Location: Left upper arm, BP Patient Position: Sitting)     Pulse  69     Temp  98 ??F (36.7 ??C)     Resp  16     Ht  5\' 3"  (1.6 m)     Wt  89 kg (196 lb 3.4 oz)     SpO2  97%        BMI  34.76 kg/m??         Oxygen Therapy   O2 Sat (%): 97 % (  08/07/19 1153)   Pulse via Oximetry: 72 beats per minute (08/05/19 1600)   O2 Device: None (Room air) (08/07/19 1153)   Skin Assessment: Clean, dry, & intact (08/04/19 1922)   Skin Protection for O2 Device: No (08/04/19 1922)   O2 Flow Rate (L/min): 0 l/min (08/05/19 0755)         Intake and Output:    No intake or output data in the 24 hours ending 08/07/19 1255         Physical Exam:    GENERAL: AOx3, no distress, appears stated age   EYE: conjunctivae/corneas clear. PERRL.   THROAT & NECK: supple, no JVD.   LUNG: clear to auscultation bilaterally   HEART: tachy, irregular rate and rhythm, S1S2, no murmur, no JVD   ABDOMEN: soft, non-tender, non-distended. Bowel sounds normal.    EXTREMITIES:  No edema, capillary refill.   SKIN: no rash or abnormalities   NEUROLOGIC: AOx3, moving all 4 extremities,  speech normal.      Lab/Data Review:   Procedures done this admission:   * No surgery found *        All Micro Results           None                  SARS-CoV-2 Lab Results     "Novel Coronavirus" Test: No results found for: COV2NT     "Emergent Disease" Test: No results found for: EDPR   "SARS-COV-2" Test: No results found for: XGCOVT   "Precision Labs" Test: No results found for: RSLT   Rapid Test: No results found for: COVR                 Labs:  Results:                  BMP, Mg, Phos  Recent Labs         08/07/19   0424  08/06/19   0358  08/05/19   0414      NA  130*  131*  131*      K  4.5  4.2  4.2      CL  98  99  100      CO2  28  26  27       AGAP  4*  6*  4*      BUN  25*  19  12      CREA  0.73  0.55*  0.57*      CA  7.5*  7.8*  7.7*      GLU  106*  104*  125*              CBC  Recent Labs         08/07/19   0424  08/06/19   0358  08/05/19   0414      WBC  11.1  8.9  8.5      RBC  2.62*  2.47*  2.42*      HGB  7.9*  7.5*  7.4*      HCT  23.6*  22.0*  21.2*      PLT  261  208  182              LFT  No results for input(s): ALT, TBIL, AP, TP, ALB, GLOB, AGRAT in the last 72 hours.      No lab exists for component: SGOT, GPT  Cardiac Testing  No results found for: BNPP, BNP, CPK, RCK1, RCK2, RCK3, RCK4, CKMB, CKNDX, CKND1, TROPT, TROIQ        Coagulation Tests  Lab Results      Component  Value  Date/Time        Prothrombin time  13.8  07/30/2019 08:40 AM        Prothrombin time  16.7 (H)  07/14/2019 11:55 AM        INR  1.0  07/30/2019 08:40 AM        INR  1.3  07/14/2019 11:55 AM        aPTT  39.2 (H)  07/30/2019 08:40 AM        aPTT  48.6 (H)  07/14/2019 11:55 AM              A1c  Lab Results      Component  Value  Date/Time        Hemoglobin A1c  5.5  07/14/2019 11:55 AM           Lipid Panel  Lab Results      Component  Value  Date/Time        Cholesterol, total  132  04/02/2019 04:07 PM        HDL Cholesterol  42  04/02/2019 04:07 PM        LDL, calculated  62  04/02/2019 04:07 PM        VLDL,  calculated  28  04/02/2019 04:07 PM        Triglyceride  80  08/03/2019 02:03 PM           Thyroid Panel  Lab Results      Component  Value  Date/Time        TSH  1.100  08/03/2019 02:03 PM        TSH  1.010  04/02/2019 04:07 PM        T4, Free  1.0  08/03/2019 02:03 PM                Most Recent UA  Lab Results      Component  Value  Date/Time        Color  YELLOW  08/03/2019 01:07 PM        Appearance  CLEAR  08/03/2019 01:07 PM        Specific gravity  1.008  08/03/2019 01:07 PM        pH (UA)  6.0  08/03/2019 01:07 PM        Protein  Negative  08/03/2019 01:07 PM        Glucose  Negative  08/03/2019 01:07 PM        Ketone  15 (A)  08/03/2019 01:07 PM        Bilirubin  Negative  08/03/2019 01:07 PM        Blood  Negative  08/03/2019 01:07 PM        Urobilinogen  0.2  08/03/2019 01:07 PM        Nitrites  Negative  08/03/2019 01:07 PM        Leukocyte Esterase  Negative  08/03/2019 01:07 PM                          Signed By:  Scarlett PrestoVipin Wiley Flicker, MD           August 07, 2019

## 2019-08-07 NOTE — Progress Notes (Signed)
Date of Outreach Update:  Brandy Vargas was seen and assessed. Pt resting quietly in bed. Respirations even and unlabored. Spoke with primary RN who has no concerns at this time. Patient has returned to her baseline of alert and oriented. Last Na was 131 and last Hgb was 7.5. No plans to transfuse unless she goes below 7.       MEWS Score: 1 (08/06/19 2315)  Vitals:    08/06/19 1550 08/06/19 1930 08/06/19 2137 08/06/19 2315   BP: (!) 91/49 (!) 106/39 (!) 109/47 (!) 114/51   Pulse: 79 87 85 90   Resp: 18 20  18    Temp: 98 F (36.7 C) 98.8 F (37.1 C)  98.7 F (37.1 C)   SpO2: 93% 97%  96%   Weight:       Height:             Pain Assessment  Pain Intensity 1: 0 (08/06/19 2000)  Pain Location 1: Hip  Pain Intervention(s) 1: Medication (see MAR), Repositioned  Patient Stated Pain Goal: 0      Previous Outreach assessment has been reviewed.  There have been no significant clinical changes since the completion of the last dated Outreach assessment.    Will continue to follow up per outreach protocol.    Signed By:   Jackie Plum, RN    August 07, 2019 1:19 AM

## 2019-08-07 NOTE — Progress Notes (Signed)
 Problem: Mobility Impaired (Adult and Pediatric)  Goal: *Acute Goals and Plan of Care (Insert Text)  Outcome: Progressing Towards Goal  Note: STG:  (1.)Brandy Vargas will move from supine to sit and sit to supine  with MODERATE ASSIST within 5 treatment day(s).   (2.)Brandy Vargas will transfer from bed to chair and chair to bed with MODERATE ASSIST using the least restrictive device within 5 treatment day(s).  Met 08/06/19  (3.)Brandy Vargas will ambulate with MODERATE ASSIST for 10 feet with the least restrictive device within 5 treatment day(s).  Met 08/06/19     LTG:  (1.)Brandy Vargas will move from supine to sit and sit to supine  in bed with CONTACT GUARD ASSIST within 10 treatment day(s).    (2.)Brandy Vargas will transfer from bed to chair and chair to bed with CONTACT GUARD ASSIST using the least restrictive device within 10 treatment day(s).  Goal met  (3.)Brandy Vargas will ambulate with CONTACT GUARD ASSIST for 50 feet with the least restrictive device within 10 treatment day(s). Goal met  NEW GOAL:  (4.)Brandy Vargas will ambulate up/down 5 steps with B HRs and CGA within 5 treatment days.  ________________________________________________________________________________________________       PHYSICAL THERAPY: Daily Note and PM 08/07/2019  INPATIENT: PT Visit Days : 3  Payor: AARP MEDICARE COMPLETE / Plan: [facility] AARP MEDICARE COMPLETE / Product Type: Managed Care Medicare /       NAME/AGE/GENDER: Brandy Vargas is a 79 y.o. female   PRIMARY DIAGNOSIS: Acute hyponatremia [E87.1]  ABLA (acute blood loss anemia) [D62]  Acute metabolic encephalopathy [G93.41] Acute hyponatremia Acute hyponatremia       ICD-10: Treatment Diagnosis:    Generalized Muscle Weakness (M62.81)  Other lack of cordination (R27.8)  Difficulty in walking, Not elsewhere classified (R26.2)   Precaution/Allergies:  Omeprazole magnesium      ASSESSMENT:     Brandy Vargas presents with above diagnoses.  Patient underwent L THA on 07/30/19.  She d/c'd home  with assist from her son on 08/01/19.  At time of d/c, she was ambulatory with SBA and was able to negotiate steps with B rails.  Son reports patient did not do well once she returned home with reports of weakness and inability to walk.  She also developed significant confusion.  Patient is currently functioning well below baseline and below level of function at time of d/c on 7/31.  She would benefit from therapy to address her function and mobility.  Expect patient will need SNF at d/c for additional rehab.      Patient alert and oriented today.  Back to baseline cognition wise.  Did need mod A for supine to sit but much less assist for sit <> stand and able to ambulate in the room.  Patient wanting to d/c home and son interested in home with [facility].  Suggested we get back to where she was Saturday-ambulating with SBA and able to negotiate 5 steps with B rails.  8/6- patient up in chair and agreeable with therapy. She is slowly progressing with her gait distance and stability but still needs one person assist for safety and would not be safe to be home alone. She ambulated a little further today and then performed therex. She and her son are both agreeable to STR and I agree that this would be the best discharge plan for her.       This section established at most recent assessment   PROBLEM LIST (Impairments causing functional limitations):  Decreased Strength  Decreased ADL/Functional Activities  Decreased Transfer Abilities  Decreased Ambulation Ability/Technique  Decreased Balance  Increased Pain  Decreased Activity Tolerance  Decreased Flexibility/Joint Mobility  Edema/Girth  Decreased Knowledge of Precautions  Decreased Independence with Home Exercise Program  Decreased Cognition   INTERVENTIONS PLANNED: (Benefits and precautions of physical therapy have been discussed with the patient.)  Balance Exercise  Bed Mobility  Family Education  Gait Training  Home Exercise Program (HEP)  Therapeutic  Activites  Therapeutic Exercise/Strengthening  Transfer Training     TREATMENT PLAN: Frequency/Duration: daily for duration of hospital stay  Rehabilitation Potential For Stated Goals: Good     REHAB RECOMMENDATIONS (at time of discharge pending progress):    Placement:  It is my opinion, based on this patient's performance to date, that Brandy Vargas may benefit from intensive therapy at a [facility] after discharge due to the functional deficits listed above that are likely to improve with skilled rehabilitation and concerns that he/she may be unsafe to be unsupervised at home due to confusion .  Equipment:   None at this time              HISTORY:   History of Present Injury/Illness (Reason for Referral):  Brandy Vargas is a 79 year old CF with a PMH of PAF, HTN, GERD, and h/o breast CA who presented to the ER with 3-4 days of nausea and vomiting and 24 hours of waxing and waning confusion and hallucinating. She had a left THA on 07/30/19 and did well with the surgery. Per the son, the patient was nauseated and had some vomiting post-op. She continued to have this, but was discharged on 08/01/19. The patient went home and continued to have vomiting and was unable to hold anything down. Then on the evening of 08/02/19 she became confused and was seeing ants on her walls. Her son got her comfortable in the chair and she fell asleep. On the morning of 08/03/19 he found the patient in the floor so he called EMS. Complains of indigestion. Denies CP/SOB. Denies dizziness. Denies diarrhea. Denies abdominal pain.  Past Medical History/Comorbidities:   Brandy Vargas  has a past medical history of Arthritis, Atrial fibrillation (HCC), Breast cancer (HCC) (2015), COVID-19 vaccine series completed (04/08/2019), GERD (gastroesophageal reflux disease), Hypertension, and Overactive bladder. She also has no past medical history of Malignant hyperthermia due to anesthesia or Pseudocholinesterase deficiency.  Brandy Vargas   has a past surgical history that includes hx appendectomy; hx tubal ligation (1976); pr breast surgery procedure unlisted (Right, 2015); hx knee arthroscopy; and hx breast lumpectomy (Right).  Social History/Living Environment:   Home Environment: Private residence  # Steps to Enter: 5  One/Two Story Residence: One story  Living Alone: Yes  Support Systems: Child(ren)  Patient Expects to be Discharged to:: Unknown  Current DME Used/Available at Home: Environmental consultant, rollator, Sedan, straight  Prior Level of Function/Work/Activity:  THA 07/30/19.  Independent prior to THA.     Number of Personal Factors/Comorbidities that affect the Plan of Care: 1-2: MODERATE COMPLEXITY   EXAMINATION:   Most Recent Physical Functioning:   Gross Assessment:                  Posture:     Balance:  Sitting: Intact  Sitting - Static: Good (unsupported)  Sitting - Dynamic: Fair (occasional)  Standing: Pull to stand;Without support Bed Mobility:  Supine to Sit: Contact guard assistance  Wheelchair Mobility:     Transfers:  Sit to Stand: Stand-by assistance;Contact guard assistance  Stand to Sit: Stand-by assistance  Bed to Chair: Contact guard assistance  Gait:     Base of Support: Narrowed  Speed/Cadence: Pace decreased (<100 feet/min)  Step Length: Left shortened;Right shortened  Gait Abnormalities: Decreased step clearance  Distance (ft): 80 Feet (ft)  Assistive Device: Walker, rolling  Ambulation - Level of Assistance: Contact guard assistance  Interventions: Verbal cues;Safety awareness training      Body Structures Involved:  Joints  Muscles Body Functions Affected:  Movement Related Activities and Participation Affected:  Mobility   Number of elements that affect the Plan of Care: 4+: HIGH COMPLEXITY   CLINICAL PRESENTATION:   Presentation: Evolving clinical presentation with changing clinical characteristics: MODERATE COMPLEXITY   CLINICAL DECISION MAKING:   [employer] AM-PACT "6 Clicks"   Basic Mobility Inpatient Short Form  How  much difficulty does the patient currently have... Unable A Lot A Little None   1.  Turning over in bed (including adjusting bedclothes, sheets and blankets)?   []  1   [x]  2   []  3   []  4   2.  Sitting down on and standing up from a chair with arms ( e.g., wheelchair, bedside commode, etc.)   []  1   [x]  2   []  3   []  4   3.  Moving from lying on back to sitting on the side of the bed?   []  1   [x]  2   []  3   []  4   How much help from another person does the patient currently need... Total A Lot A Little None   4.  Moving to and from a bed to a chair (including a wheelchair)?   []  1   [x]  2   []  3   []  4   5.  Need to walk in hospital room?   []  1   [x]  2   []  3   []  4   6.  Climbing 3-5 steps with a railing?   [x]  1   []  2   []  3   []  4    2007, Trustees of [address], under license to [place], [place]. All rights reserved      Score:  Initial: 11 Most Recent: X (Date: -- )    Interpretation of Tool:  Represents activities that are increasingly more difficult (i.e. Bed mobility, Transfers, Gait).    Medical Necessity:     Patient is expected to demonstrate progress in   strength, range of motion, balance, coordination, and functional technique   to   decrease assistance required with mobility.  .  Reason for Services/Other Comments:  Patient continues to require skilled intervention due to   Weakness and confusion limiting mobility.  .   Use of outcome tool(s) and clinical judgement create a POC that gives a: Questionable prediction of patient's progress: MODERATE COMPLEXITY            TREATMENT:   (In addition to Assessment/Re-Assessment sessions the following treatments were rendered)   Pre-treatment Symptoms/Complaints:  Patient agreeable.  Pain: Initial:      Post Session:  4     Therapeutic Activity: (    15 minutes):  Therapeutic activities including  chair transfers, Ambulation on level ground,  to improve mobility, strength, balance, and coordination.  Required moderate Verbal cues;Safety awareness  training to promote static and dynamic balance in sitting and standing and promote coordination of bilateral, lower extremity(s).  Therapeutic Exercise: (10 Minutes):  Exercises per grid below to improve strength.  Required minimal verbal cues to promote proper body alignment.      Date:  08/06/19 Date:  8/6 Date:     Activity/Exercise Parameters Parameters Parameters   Ankle pumps 15 B     Quad sets 15 B     Gluteal squeeze 15     Hip abduction/adduction 15-AA 15    Heel slides 15-AA     Short arc quad 15 15    Long arc quad 15 15    Seated marches  15          Braces/Orthotics/Lines/Etc:   O2 Device: None (Room air)  Treatment/Session Assessment:    Response to Treatment:  Patient alert and oriented today with much better mobility. Slow consistent progress  Interdisciplinary Collaboration:   Physical Therapist  Occupational Therapist  Registered Nurse  After treatment position/precautions:   Up in chair  Bed/Chair-wheels locked  Call light within reach  RN notified  Family at bedside   Compliance with Program/Exercises: Will assess as treatment progresses  Recommendations/Intent for next treatment session:  Next visit will focus on advancements to more challenging activities and reduction in assistance provided.  Total Treatment Duration:  PT Patient Time In/Time Out  Time In: 1015  Time Out: 1040    Ronal FORBES Bumps, PT

## 2019-08-07 NOTE — Progress Notes (Signed)
Patient resting quietly in chair, alert and oriented x 4.  Denies pain at this time.  Nursing assessment completed.  Call bell within patient's reach.  Will continue monitoring.

## 2019-08-08 LAB — CBC
Hematocrit: 23.7 % — ABNORMAL LOW (ref 35.8–46.3)
Hemoglobin: 7.7 g/dL — ABNORMAL LOW (ref 11.7–15.4)
MCH: 30 PG (ref 26.1–32.9)
MCHC: 32.5 g/dL (ref 31.4–35.0)
MCV: 92.2 FL (ref 79.6–97.8)
MPV: 9.2 FL — ABNORMAL LOW (ref 9.4–12.3)
NRBC Absolute: 0 10*3/uL (ref 0.0–0.2)
Platelets: 281 10*3/uL (ref 150–450)
RBC: 2.57 M/uL — ABNORMAL LOW (ref 4.05–5.2)
RDW: 14.6 % (ref 11.9–14.6)
WBC: 12.4 10*3/uL — ABNORMAL HIGH (ref 4.3–11.1)

## 2019-08-08 LAB — BASIC METABOLIC PANEL
Anion Gap: 5 mmol/L — ABNORMAL LOW (ref 7–16)
BUN: 24 MG/DL — ABNORMAL HIGH (ref 8–23)
CO2: 28 mmol/L (ref 21–32)
Calcium: 8.7 MG/DL (ref 8.3–10.4)
Chloride: 100 mmol/L (ref 98–107)
Creatinine: 0.75 MG/DL (ref 0.6–1.0)
EGFR IF NonAfrican American: >60 mL/min/{1.73_m2} (ref >60)
GFR African American: >60 mL/min/{1.73_m2} (ref >60)
Glucose: 110 mg/dL — ABNORMAL HIGH (ref 65–100)
Potassium: 4.7 mmol/L (ref 3.5–5.1)
Sodium: 133 mmol/L — ABNORMAL LOW (ref 136–145)

## 2019-08-08 LAB — METABOLIC PANEL, BASIC
Anion gap: 5 mmol/L — ABNORMAL LOW (ref 7–16)
BUN: 24 MG/DL — ABNORMAL HIGH (ref 8–23)
CO2: 28 mmol/L (ref 21–32)
Calcium: 8.7 MG/DL (ref 8.3–10.4)
Chloride: 100 mmol/L (ref 98–107)
Creatinine: 0.75 MG/DL (ref 0.6–1.0)
GFR est AA: >60 mL/min/{1.73_m2} (ref >60)
GFR est non-AA: >60 mL/min/{1.73_m2} (ref >60)
Glucose: 110 mg/dL — ABNORMAL HIGH (ref 65–100)
Potassium: 4.7 mmol/L (ref 3.5–5.1)
Sodium: 133 mmol/L — ABNORMAL LOW (ref 136–145)

## 2019-08-08 LAB — CBC W/O DIFF
ABSOLUTE NRBC: 0 10*3/uL (ref 0.0–0.2)
HCT: 23.7 % — ABNORMAL LOW (ref 35.8–46.3)
HGB: 7.7 g/dL — ABNORMAL LOW (ref 11.7–15.4)
MCH: 30 PG (ref 26.1–32.9)
MCHC: 32.5 g/dL (ref 31.4–35.0)
MCV: 92.2 FL (ref 79.6–97.8)
MPV: 9.2 FL — ABNORMAL LOW (ref 9.4–12.3)
PLATELET: 281 10*3/uL (ref 150–450)
RBC: 2.57 M/uL — ABNORMAL LOW (ref 4.05–5.2)
RDW: 14.6 % (ref 11.9–14.6)
WBC: 12.4 10*3/uL — ABNORMAL HIGH (ref 4.3–11.1)

## 2019-08-08 MED FILL — SUCRALFATE 1 GRAM TAB: 1 gram | ORAL | Qty: 1

## 2019-08-08 MED FILL — TROSPIUM 20 MG TAB: 20 mg | ORAL | Qty: 1

## 2019-08-08 MED FILL — ELIQUIS 5 MG TABLET: 5 mg | ORAL | Qty: 1

## 2019-08-08 MED FILL — METOPROLOL TARTRATE 50 MG TAB: 50 mg | ORAL | Qty: 1

## 2019-08-08 MED FILL — ATORVASTATIN 10 MG TAB: 10 mg | ORAL | Qty: 1

## 2019-08-08 MED FILL — FAMOTIDINE 20 MG TAB: 20 mg | ORAL | Qty: 1

## 2019-08-08 MED FILL — VALSARTAN 160 MG TAB: 160 mg | ORAL | Qty: 1

## 2019-08-08 NOTE — Progress Notes (Signed)
Paged hospitalist with patient's low blood pressure, holding metoprolol.   See note.

## 2019-08-08 NOTE — Progress Notes (Signed)
 Problem: Self Care Deficits Care Plan (Adult)  Goal: *Acute Goals and Plan of Care (Insert Text)  08/05/2019 1214 by Delores Grizzle B, OT  Outcome: Progressing Towards Goal  Note:   1. Patient will perform grooming with stand by assistance seated edge of bed. GOAL MET 08/06/2019  2. Patient will perform Upper body dressing with stand by assistance seated edge of bed  3. Patient will perform bathing with moderate assistance seated edge of bed or on shower chair.   4. Patient will perform toilet transfers with moderate assistance bed to [facility].GOAL MET 08/08/2019  5. Patient will participate in 30 + minutes of ADL/ therapeutic exercise/therapeutic activity with min rest breaks to increase activity tolerance for self care.  6. Patient will perform ADL functional mobility in room (bed to chair, bed to [facility]) with moderate assistance.     Goals to be achieved in 7 days.       OCCUPATIONAL THERAPY: Daily Note and AM 08/08/2019  INPATIENT: OT Visit Days: 4  Payor: AARP MEDICARE COMPLETE / Plan: [facility] AARP MEDICARE COMPLETE / Product Type: Managed Care Medicare /      NAME/AGE/GENDER: Brandy Vargas is a 79 y.o. female   PRIMARY DIAGNOSIS:  Acute hyponatremia [E87.1]  ABLA (acute blood loss anemia) [D62]  Acute metabolic encephalopathy [G93.41] Acute hyponatremia Acute hyponatremia       ICD-10: Treatment Diagnosis:    Generalized Muscle Weakness (M62.81)  Other lack of cordination (R27.8)   Precautions/Allergies:     Omeprazole magnesium      ASSESSMENT:     Brandy Vargas presents with with above diagnosis. Pt was discharged on Saturday after left THA. Pt was seen in ICU with PT present. Noted upon entry drainage from left hip dressing, RN notified and assisted pt to roll for dressing change. Pt assisted with upper body sponge off and pt washed her face and assisted with gown change. Pt up to edge of bed and worked on standing and taking side steps. Pt is noted to have significant generalized weakness, decreased activity tolerance and  mobility. Pt also noted to have some confusion. Pt would benefit from OT while in the hospital to maximize safety and independence with self care and functional mobility. Pt would likely benefit from STR as she currently needs quick a bit of assistance.     08/06/2019  Pt was out of ICU and in a regular room. Pt seen with PT due to previous difficulty of mobility. Pt was oriented x 4 today and seems cognitively intact. Pt also moved much better leading the son to consider taking her home. OT will continue plan of care.      08/07/2019  Pt was seen in room with son present. Pt continue to make progress with mobility and self care.  Pt up in room with walker, to bathroom for grooming and self care seated at sink. Pt make to recliner and left with son present. Pt tolerated well will continue plan of care.     08/08/19 Pt was sitting in chair upon arrival. Pt completed functional mobility with rolling walker.  Pt completed toilet transfer with SBA. Pt completed toileting with supervision. Pt stood at sink to completed grooming. Pt is progressing towards goals. Continue POC.     This section established at most recent assessment   PROBLEM LIST (Impairments causing functional limitations):  Decreased Strength  Decreased ADL/Functional Activities  Decreased Transfer Abilities  Decreased Ambulation Ability/Technique  Decreased Balance  Increased Pain  Decreased Activity  Tolerance  Increased Fatigue  Decreased Cognition   INTERVENTIONS PLANNED: (Benefits and precautions of occupational therapy have been discussed with the patient.)  Activities of daily living training  Adaptive equipment training  Balance training  Cognitive training  Therapeutic activity  Therapeutic exercise     TREATMENT PLAN: Frequency/Duration: Follow patient 3 times per week to address above goals.  Rehabilitation Potential For Stated Goals: Good     REHAB RECOMMENDATIONS (at time of discharge pending progress):    Placement:  It is my opinion, based on this  patient's performance to date, that Brandy Vargas may benefit from intensive therapy at a [facility] FACILITY after discharge due to the functional deficits listed above that are likely to improve with skilled rehabilitation and concerns that he/she may be unsafe to be unsupervised at home due to confusion and amount of assistance required .  Equipment:   None at this time              OCCUPATIONAL PROFILE AND HISTORY:   History of Present Injury/Illness (Reason for Referral):  See H&P  Past Medical History/Comorbidities:   Brandy Vargas  has a past medical history of Arthritis, Atrial fibrillation (HCC), Breast cancer (HCC) (2015), COVID-19 vaccine series completed (04/08/2019), GERD (gastroesophageal reflux disease), Hypertension, and Overactive bladder. She also has no past medical history of Malignant hyperthermia due to anesthesia or Pseudocholinesterase deficiency.  Brandy Vargas  has a past surgical history that includes hx appendectomy; hx tubal ligation (1976); pr breast surgery procedure unlisted (Right, 2015); hx knee arthroscopy; and hx breast lumpectomy (Right).  Social History/Living Environment:   Home Environment: Private residence  # Steps to Enter: 5  One/Two Story Residence: One story  Living Alone: Yes  Support Systems: Child(ren)  Patient Expects to be Discharged to:: Unknown  Current DME Used/Available at Home: Environmental consultant, rollator, Medical laboratory scientific officer, straight  Prior Level of Function/Work/Activity:  Independent with all activities of daily living to include driving.      Number of Personal Factors/Comorbidities that affect the Plan of Care: Brief history (0):  LOW COMPLEXITY   ASSESSMENT OF OCCUPATIONAL PERFORMANCE::   Activities of Daily Living:   Basic ADLs (From Assessment) Complex ADLs (From Assessment)   Feeding: Setup  Oral Facial Hygiene/Grooming: Minimum assistance  Bathing: Maximum assistance  Upper Body Dressing: Minimum assistance  Lower Body Dressing: Total assistance  Toileting: Total assistance      Grooming/Bathing/Dressing Activities of Daily Living   Grooming  Washing Hands: Independent  Brushing Teeth: Independent Cognitive Retraining  Safety/Judgement: Fall prevention           Toileting  Bladder Hygiene: Independent     Functional Transfers  Toilet Transfer : Stand-by assistance  Adaptive Equipment: Grab bars     Bed/Mat Mobility  Sit to Stand: Stand-by assistance  Stand to Sit: Stand-by assistance  Bed to Chair: Stand-by assistance     Most Recent Physical Functioning:   Gross Assessment:                  Posture:     Balance:  Sitting: Intact  Standing: Intact Bed Mobility:     Wheelchair Mobility:     Transfers:  Sit to Stand: Stand-by assistance  Stand to Sit: Stand-by assistance  Bed to Chair: Stand-by assistance            Patient Vitals for the past 6 hrs:   BP BP Patient Position SpO2 Pulse   08/08/19 0741 (!) 130/44 Sitting 96 % 75  08/08/19 1207 (!) 132/50 Sitting;At rest 96 % 75       Mental Status  Neurologic State: Alert  Orientation Level: Oriented X4  Cognition: Appropriate decision making, Appropriate for age attention/concentration  Perception: Appears intact  Perseveration: No perseveration noted  Safety/Judgement: Fall prevention                          Physical Skills Involved:  Range of Motion  Balance  Activity Tolerance  Pain (acute) Cognitive Skills Affected (resulting in the inability to perform in a timely and safe manner):  Executive Function  Short Term Recall  Sustained Attention Psychosocial Skills Affected:  Environmental Adaptation   Number of elements that affect the Plan of Care: 5+:  HIGH COMPLEXITY   CLINICAL DECISION MAKING:   [employer] AM-PACT "6 Clicks"   Daily Activity Inpatient Short Form  How much help from another person does the patient currently need... Total A Lot A Little None   1.  Putting on and taking off regular lower body clothing?   [x]  1   []  2   []  3   []  4   2.  Bathing (including washing, rinsing, drying)?   []  1   [x]  2   []  3   []  4    3.  Toileting, which includes using toilet, bedpan or urinal?   [x]  1   []  2   []  3   []  4   4.  Putting on and taking off regular upper body clothing?   []  1   [x]  2   []  3   []  4   5.  Taking care of personal grooming such as brushing teeth?   []  1   []  2   [x]  3   []  4   6.  Eating meals?   []  1   []  2   []  3   [x]  4    2007, Trustees of [address], under license to [place], [place]. All rights reserved      Score:  Initial: 13 Most Recent: X (Date: -- )    Interpretation of Tool:  Represents activities that are increasingly more difficult (i.e. Bed mobility, Transfers, Gait).     Use of outcome tool(s) and clinical judgement create a POC that gives a: LOW COMPLEXITY         TREATMENT:   (In addition to Assessment/Re-Assessment sessions the following treatments were rendered)     Pre-treatment Symptoms/Complaints:    Pain: Initial:   Pain Intensity 1: 0 3 Post Session:  3     Self Care: (24): Procedure(s) (per grid) utilized to improve and/or restore self-care/home management as related to toileting and grooming. Required min verbal and manual cueing to facilitate activities of daily living skills.      Braces/Orthotics/Lines/Etc:   O2 Device: None (Room air)  Treatment/Session Assessment:    Response to Treatment:  pt tolerated fair.   Interdisciplinary Collaboration:   Physical Therapist  Certified Occupational Therapy Assistant  Registered Nurse  After treatment position/precautions:   Up in chair  Bed/Chair-wheels locked  Call light within reach  RN notified  Family at bedside   Compliance with Program/Exercises: Compliant all of the time, Will assess as treatment progresses.  Recommendations/Intent for next treatment session:  Next visit will focus on advancements to more challenging activities and reduction in assistance provided.  Total Treatment Duration:  OT Patient Time In/Time Out  Time In: 1116  Time Out: [address], [place]

## 2019-08-08 NOTE — Progress Notes (Signed)
Received patient awake in recliner watching tv, alert and oriented x 4.  Denies pain or needs at this time.  Nursing assessment completed.   Call bell placed within reach.  Will continue to monitor.

## 2019-08-08 NOTE — Progress Notes (Signed)
 Problem: Mobility Impaired (Adult and Pediatric)  Goal: *Acute Goals and Plan of Care (Insert Text)  Outcome: Progressing Towards Goal  Note: STG:  (1.)Brandy Vargas will move from supine to sit and sit to supine  with MODERATE ASSIST within 5 treatment day(s).   (2.)Brandy Vargas will transfer from bed to chair and chair to bed with MODERATE ASSIST using the least restrictive device within 5 treatment day(s).  Met 08/06/19  (3.)Brandy Vargas will ambulate with MODERATE ASSIST for 10 feet with the least restrictive device within 5 treatment day(s).  Met 08/06/19     LTG:  (1.)Brandy Vargas will move from supine to sit and sit to supine  in bed with CONTACT GUARD ASSIST within 10 treatment day(s).    (2.)Brandy Vargas will transfer from bed to chair and chair to bed with CONTACT GUARD ASSIST using the least restrictive device within 10 treatment day(s).  Goal met  (3.)Brandy Vargas will ambulate with CONTACT GUARD ASSIST for 50 feet with the least restrictive device within 10 treatment day(s). Goal met  NEW GOALS:  (4.)Brandy Vargas will ambulate up/down 5 steps with B HRs and CGA within 5 treatment days.  (5.)Brandy Vargas will ambulate 200' with supervision within 5 treatment days.  (6.)Brandy Vargas will transfer between the bed and the chair with supervision within 5 treatment days.  ________________________________________________________________________________________________       PHYSICAL THERAPY: Daily Note and AM 08/08/2019  INPATIENT: PT Visit Days : 4  Payor: AARP MEDICARE COMPLETE / Plan: [facility] AARP MEDICARE COMPLETE / Product Type: Managed Care Medicare /       NAME/AGE/GENDER: Brandy Vargas is a 79 y.o. female   PRIMARY DIAGNOSIS: Acute hyponatremia [E87.1]  ABLA (acute blood loss anemia) [D62]  Acute metabolic encephalopathy [G93.41] Acute hyponatremia Acute hyponatremia       ICD-10: Treatment Diagnosis:    Generalized Muscle Weakness (M62.81)  Other lack of cordination (R27.8)  Difficulty in walking, Not elsewhere  classified (R26.2)   Precaution/Allergies:  Omeprazole magnesium      ASSESSMENT:     Ms. Gibb presents with above diagnoses.  Patient underwent L THA on 07/30/19.  She d/c'd home with assist from her son on 08/01/19.  At time of d/c, she was ambulatory with SBA and was able to negotiate steps with B rails.  Son reports patient did not do well once she returned home with reports of weakness and inability to walk.  She also developed significant confusion.      Patient up in chair and needing to toilet-doesn't feel she can make it to bathroom due to urgency.  SBA for transfers, gait, and hygiene.  Ambulated increased distance with decreased level of assist.  Struggles with hip abduction/adduction and heel slides without assist but otherwise tolerating all exercises well.      This section established at most recent assessment   PROBLEM LIST (Impairments causing functional limitations):  Decreased Strength  Decreased ADL/Functional Activities  Decreased Transfer Abilities  Decreased Ambulation Ability/Technique  Decreased Balance  Increased Pain  Decreased Activity Tolerance  Decreased Flexibility/Joint Mobility  Edema/Girth  Decreased Knowledge of Precautions  Decreased Independence with Home Exercise Program  Decreased Cognition   INTERVENTIONS PLANNED: (Benefits and precautions of physical therapy have been discussed with the patient.)  Balance Exercise  Bed Mobility  Family Education  Gait Training  Home Exercise Program (HEP)  Therapeutic Activites  Therapeutic Exercise/Strengthening  Transfer Training     TREATMENT PLAN: Frequency/Duration: daily for duration of hospital stay  Rehabilitation Potential For Stated Goals: Good     REHAB RECOMMENDATIONS (at time of discharge pending progress):    Placement:  It is my opinion, based on this patient's performance to date, that Ms. Bergstresser may benefit from intensive therapy at a [facility] FACILITY after discharge due to the functional deficits listed above that  are likely to improve with skilled rehabilitation and concerns that he/she may be unsafe to be unsupervised at home due to confusion .  Equipment:   None at this time              HISTORY:   History of Present Injury/Illness (Reason for Referral):  [name] is a 79 year old CF with a PMH of PAF, HTN, GERD, and h/o breast CA who presented to the ER with 3-4 days of nausea and vomiting and 24 hours of waxing and waning confusion and hallucinating. She had a left THA on 07/30/19 and did well with the surgery. Per the son, the patient was nauseated and had some vomiting post-op. She continued to have this, but was discharged on 08/01/19. The patient went home and continued to have vomiting and was unable to hold anything down. Then on the evening of 08/02/19 she became confused and was seeing ants on her walls. Her son got her comfortable in the chair and she fell asleep. On the morning of 08/03/19 he found the patient in the floor so he called EMS. Complains of indigestion. Denies CP/SOB. Denies dizziness. Denies diarrhea. Denies abdominal pain.  Past Medical History/Comorbidities:   Brandy Vargas  has a past medical history of Arthritis, Atrial fibrillation (HCC), Breast cancer (HCC) (2015), COVID-19 vaccine series completed (04/08/2019), GERD (gastroesophageal reflux disease), Hypertension, and Overactive bladder. She also has no past medical history of Malignant hyperthermia due to anesthesia or Pseudocholinesterase deficiency.  Brandy Vargas  has a past surgical history that includes hx appendectomy; hx tubal ligation (1976); pr breast surgery procedure unlisted (Right, 2015); hx knee arthroscopy; and hx breast lumpectomy (Right).  Social History/Living Environment:   Home Environment: Private residence  # Steps to Enter: 5  One/Two Story Residence: One story  Living Alone: Yes  Support Systems: Child(ren)  Patient Expects to be Discharged to:: Unknown  Current DME Used/Available at Home: Environmental consultant, rollator, [employer],  straight  Prior Level of Function/Work/Activity:  THA 07/30/19.  Independent prior to THA.     Number of Personal Factors/Comorbidities that affect the Plan of Care: 1-2: MODERATE COMPLEXITY   EXAMINATION:   Most Recent Physical Functioning:   Gross Assessment:  AROM: Generally decreased, functional  Strength: Generally decreased, functional  Coordination: Within functional limits               Posture:     Balance:  Sitting: Intact  Standing: With support Bed Mobility:     Wheelchair Mobility:     Transfers:  Sit to Stand: Stand-by assistance  Stand to Sit: Stand-by assistance  Bed to Chair: Stand-by assistance  Gait:  Left Side Weight Bearing: As tolerated  Speed/Cadence: Pace decreased (<100 feet/min)  Step Length: Left shortened;Right shortened  Stance: Left decreased  Gait Abnormalities: Antalgic;Decreased step clearance  Distance (ft): 90 Feet (ft)  Assistive Device: Walker, rolling  Ambulation - Level of Assistance: Stand-by assistance  Interventions: Safety awareness training;Verbal cues      Body Structures Involved:  Joints  Muscles Body Functions Affected:  Movement Related Activities and Participation Affected:  Mobility   Number of elements that affect the Plan  of Care: 4+: HIGH COMPLEXITY   CLINICAL PRESENTATION:   Presentation: Evolving clinical presentation with changing clinical characteristics: MODERATE COMPLEXITY   CLINICAL DECISION MAKING:   [employer] AM-PACT "6 Clicks"   Basic Mobility Inpatient Short Form  How much difficulty does the patient currently have... Unable A Lot A Little None   1.  Turning over in bed (including adjusting bedclothes, sheets and blankets)?   []  1   [x]  2   []  3   []  4   2.  Sitting down on and standing up from a chair with arms ( e.g., wheelchair, bedside commode, etc.)   []  1   [x]  2   []  3   []  4   3.  Moving from lying on back to sitting on the side of the bed?   []  1   [x]  2   []  3   []  4   How much help from another person does the patient currently  need... Total A Lot A Little None   4.  Moving to and from a bed to a chair (including a wheelchair)?   []  1   [x]  2   []  3   []  4   5.  Need to walk in hospital room?   []  1   [x]  2   []  3   []  4   6.  Climbing 3-5 steps with a railing?   [x]  1   []  2   []  3   []  4    2007, Trustees of [address], under license to [place], [place]. All rights reserved      Score:  Initial: 11 Most Recent: X (Date: -- )    Interpretation of Tool:  Represents activities that are increasingly more difficult (i.e. Bed mobility, Transfers, Gait).    Medical Necessity:     Patient is expected to demonstrate progress in   strength, range of motion, balance, coordination, and functional technique   to   decrease assistance required with mobility.  .  Reason for Services/Other Comments:  Patient continues to require skilled intervention due to   Weakness and confusion limiting mobility.  .   Use of outcome tool(s) and clinical judgement create a POC that gives a: Questionable prediction of patient's progress: MODERATE COMPLEXITY            TREATMENT:   (In addition to Assessment/Re-Assessment sessions the following treatments were rendered)   Pre-treatment Symptoms/Complaints:  Patient agreeable.  Needing to toilet  Pain: Initial:   Pain Intensity 1: 4  Post Session:  4     Therapeutic Activity: (    30 minutes):  Therapeutic activities including  chair transfers, toilet transfers, standing balance for hygiene, and ambulation on level ground,  to improve mobility, strength, balance, and coordination.  Required moderate Safety awareness training;Verbal cues to promote static and dynamic balance in sitting and standing and promote coordination of bilateral, lower extremity(s).   Therapeutic Exercise: ( ):  Exercises per grid below to improve strength.  Required minimal verbal cues to promote proper body alignment.      Date:  08/06/19 Date:  8/6 Date:  08/08/19   Activity/Exercise Parameters Parameters Parameters   Ankle pumps 15 B  20 B    Quad sets 15 B  20   Gluteal squeeze 15  20   Hip abduction/adduction 15-AA 15 20 AA   Heel slides 15-AA  20 AA   Short arc quad 15 15 20    Long  arc quad 15 15 20    Seated marches  15          Braces/Orthotics/Lines/Etc:   O2 Device: None (Room air)  Treatment/Session Assessment:    Response to Treatment:  Patient participated well and moving well.  Interdisciplinary Collaboration:   Physical Therapist  Registered Nurse  After treatment position/precautions:   Up in chair  Bed/Chair-wheels locked  Call light within reach   Compliance with Program/Exercises: Will assess as treatment progresses  Recommendations/Intent for next treatment session:  Next visit will focus on advancements to more challenging activities and reduction in assistance provided.  Total Treatment Duration:  PT Patient Time In/Time Out  Time In: 0940  Time Out: 1010    Dwayne JINNY Fuelling, PT

## 2019-08-08 NOTE — Progress Notes (Signed)
Progress  Notes by Scarlett Presto, MD at 08/08/19 1325                Author: Scarlett Presto, MD  Service: Internal Medicine  Author Type: Physician       Filed: 08/08/19 1328  Date of Service: 08/08/19 1325  Status: Signed          Editor: Scarlett Presto, MD (Physician)                         [facility] Hospitalist Progress Note          Patient: Brandy Vargas  MRN: [id number]   SSN: [id number]          Date of Birth: [birth year]   Age: 79 y.o.   Sex: female         Admit Date: 08/03/2019     LOS: 5 days         Subjective:        Chief Complaint: Confusion   Reason for Admission: Acute hyponatremia      79 year old CF with a PMH of PAF, HTN, GERD, and h/o breast CA who presented to the ER with 3-4 days of nausea and vomiting and 24 hours of waxing and waning confusion  and hallucinating. She had a left THA on 07/30/19 and did well with the surgery. Per the son, the patient was nauseated and had some vomiting post-op. She continued to have this, but was discharged on 08/01/19. The patient went home and continued to have  vomiting and was unable to hold anything down. Then on the evening of 08/02/19 she became confused and was seeing ants on her walls. Her son got her comfortable in the chair and she fell asleep. On the morning of 08/03/19 he found the patient in the floor  so he called EMS. She was admitted for sodium 117 and anemia. She was transfused 1U PRBC. She became acutely confused and has persisted.      8/4 - She continues to be confused this AM. Not agitated, but hallucinating and not sure what she's saying. Thinks she is at the "[facility]". Reaching for her non-existent keys on her table. Says she slept well (didn't sleep much last night  per nursing). Denies CP/SOB. Denies N/V/D.      August 5: Patient is AOx3 today.  Eating her breakfast.  Son was present at bedside and as per son patient has started around 180 degrees.  Patient denies any nausea, vomiting, diarrhea, chest pain or shortness of  breath.      August 6: Patient is AOx3.  She ate her dinner and breakfast yesterday.  Patient son at bedside.  Denies any nausea, vomiting, chest pain or shortness of breath.  No bowel movement in last 1 week but no abdominal discomfort.      August 7: Patient is AOx3.  Denies any complaint.  Feeling much better.  Last bowel movement documented yesterday.  Denies any nausea, vomiting, chest pain or palpitations.         Review of systems negative except stated above.        Assessment:        Primary Diagnosis: Acute hyponatremia         Hospital Problems  as of 08/08/2019  Date Reviewed:  08/06/19                     Codes  Class  Noted - Resolved  POA              * (Principal) Acute hyponatremia  ICD-10-CM: E87.1   ICD-9-CM: 276.1    08/03/2019 - Present  Yes                        ABLA (acute blood loss anemia)  ICD-10-CM: D62   ICD-9-CM: 285.1    08/03/2019 - Present  Yes                        History of total hip arthroplasty, left  ICD-10-CM: V76.160   ICD-9-CM: V43.64    08/03/2019 - Present  Yes                        Leukocytosis  ICD-10-CM: D72.829   ICD-9-CM: 288.60    08/03/2019 - Present  Yes                        Pure hyperglyceridemia  ICD-10-CM: E78.1   ICD-9-CM: 272.1    04/28/2019 - Present  Yes                        Paroxysmal atrial fibrillation (HCC)  ICD-10-CM: I48.0   ICD-9-CM: 427.31    04/28/2019 - Present  Yes                        Essential hypertension  ICD-10-CM: I10   ICD-9-CM: 401.9    04/28/2019 - Present  Yes                        RESOLVED: Acute metabolic encephalopathy  ICD-10-CM: G93.41   ICD-9-CM: 348.31    08/03/2019 - 08/06/2019  Yes                        RESOLVED: Nausea and vomiting  ICD-10-CM: R11.2   ICD-9-CM: 787.01    08/03/2019 - 08/06/2019  Yes                            Active Medical Issues and Plan (MDM):        Principal Problem:     Acute hyponatremia (08/03/2019)   - Sodium 138 (07/14/19) --> 117 (08/03/19)   - Currently 117 --> 126 --> 131   - Likely due to vomiting and  dehydration   - Stop normal saline   - UA with ketones, otherwise normal   - Urine sodium 17 + osm 236   - Serum osm 246   - TFTs unremarkable   - Triglycerides 80   - Trend sodium Q6H   - Strict I/O   ??   August 5: Sodium stable.  Patient is AOx3.  Discussed with the patient and patient's son to increase p.o. intake.  Will monitor.   Likely secondary to dehydration secondary to nausea vomiting.      Problem 6: Stable.  Will monitor.      Active Problems:     Atrial Fibrillation with RVR (08/04/2019)   - The patient flipped into Afib RVR - now back in NSR   - Off Cardizem gtt   - Continue Lopressor 50mg  BID   - Continue Eliquis 2.5 BID   - Give Lopressor  IV  PRN if BP permits            August 6: Discussed with patient and patient's son.  Patient creatinine is less than 1.5 and weight is more than 60 kg.  Patient was taking Eliquis 5 mg twice daily at home.  Risk and benefit discussed and family wants patient to take Eliquis 5 mg twice  daily.  Does not have any stent in her body so we will stop aspirin.  Family in agreement.   Next  ABLA (acute blood loss anemia) (08/03/2019)   - Likely due to left THA   - Hgb 7.4 at discharge --> admit 6.6 --> 1U PRBC --> 7.9 --> 7.4   - Continue Eliquis due to flipping in/out of Afib   - Transfuse Hgb <7.0   ??   August 5: As patient received blood transfusion during hospitalization so we will check iron studies, B12 and folate level tomorrow morning.      August 6: Will monitor.        Acute metabolic encephalopathy (08/03/2019)   - Worse overnight   - Pleasantly confused   - Continue Seroquel  QHS   - Likely due to hyponatremia + delirium   - UA unremarkable   - TFTs unremarkable   ??   August 5: Resolved.  Will change Seroquel to as needed.        Nausea and vomiting (08/03/2019)   - Resolved   - Unsure etiology --> has been happening since post-op per son   - No abdominal pain   - Start Zofran PRN   - Start Phenergan PRN   ??   August 5: Resolved.        Leukocytosis  (08/03/2019)   - I suspect this is reactive   - Resolved   - Decrease normal saline   - UA unremarkable   August 5: Resolved.        Essential hypertension (04/28/2019)   - Stable   - Continue Lopressor   - Continue ARB   ??   August 6: given hypotension, will decrease the dose of ARB into half.        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     Paroxysmal atrial fibrillation (HCC) (04/28/2019)   - Currently NSR   - Continue Lopressor   - Continue low dose Eliquis      Otherwise all chronic medical issues appear stable with no changes.      Diet: ADULT ORAL NUTRITION SUPPLEMENT Breakfast; Clear Liquid   ADULT ORAL NUTRITION SUPPLEMENT Lunch; Standard High Calorie/High Protein   ADULT ORAL NUTRITION SUPPLEMENT Dinner; Frozen Supplement   ADULT DIET Regular   VTE ppx: Eliquis      Patient is AOx3.    I was informed that patient and family wants patient to go to rehab.  Social worker working on placement.  Patient has already reached maximal optimization of her inpatient stay.  Waiting for placement.  Patient's son will be here  shortly person and she will asked the nurse to page me if she or her son has any questions.      I have updated patient's son regularly.        Objective:        Visit Vitals      BP  (!) 132/50 (BP 1 Location: Left upper arm, BP Patient  Position: Sitting;At rest)     Pulse  75     Temp  98.3 ??F (36.8 ??C)     Resp  16     Ht  5\' 3"  (1.6 m)     Wt  89 kg (196 lb 3.4 oz)     SpO2  96%        BMI  34.76 kg/m??         Oxygen Therapy   O2 Sat (%): 96 % (08/08/19 1207)   Pulse via Oximetry: 72 beats per minute (08/05/19 1600)   O2 Device: None (Room air) (08/08/19 0732)   Skin Assessment: Clean, dry, & intact (08/04/19 1922)   Skin Protection for O2 Device: No (08/04/19 1922)   O2 Flow Rate (L/min): 0 l/min (08/05/19 0755)         Intake and Output:       Intake/Output Summary (Last 24 hours) at 08/08/2019 1326   Last data filed at  08/08/2019 0250     Gross per 24 hour        Intake  --        Output  1 ml        Net  -1 ml              Physical Exam:    GENERAL: AOx3, no distress, appears stated age   EYE: conjunctivae/corneas clear. PERRL.   THROAT & NECK: supple, no JVD.   LUNG: clear to auscultation bilaterally   HEART: tachy, irregular rate and rhythm, S1S2, no murmur, no JVD   ABDOMEN: soft, non-tender, non-distended. Bowel sounds normal.    EXTREMITIES:  No edema, capillary refill.   SKIN: no rash or abnormalities   NEUROLOGIC: AOx3, moving all 4 extremities, speech normal.      Lab/Data Review:   Procedures done this admission:   * No surgery found *        All Micro Results           None                  SARS-CoV-2 Lab Results     "Novel Coronavirus" Test: No results found for: COV2NT     "Emergent Disease" Test: No results found for: EDPR   "SARS-COV-2" Test: No results found for: XGCOVT   "Precision Labs" Test: No results found for: RSLT   Rapid Test: No results found for: COVR                 Labs:  Results:                  BMP, Mg, Phos  Recent Labs         08/08/19   0420  08/07/19   1305  08/07/19   0424      NA  133*  133*  130*      K  4.7  4.3  4.5      CL  100  100  98      CO2  28  28  28       AGAP  5*  5*  4*      BUN  24*  21  25*      CREA  0.75  0.65  0.73      CA  8.7  7.9*  7.5*      GLU  110*  101*  106*  CBC  Recent Labs         08/08/19   0420  08/07/19   0424  08/06/19   0358      WBC  12.4*  11.1  8.9      RBC  2.57*  2.62*  2.47*      HGB  7.7*  7.9*  7.5*      HCT  23.7*  23.6*  22.0*      PLT  281  261  208              LFT  No results for input(s): ALT, TBIL, AP, TP, ALB, GLOB, AGRAT in the last 72 hours.      No lab exists for component: SGOT, GPT     Cardiac Testing  No results found for: BNPP, BNP, CPK, RCK1, RCK2, RCK3, RCK4, CKMB, CKNDX, CKND1, TROPT, TROIQ        Coagulation Tests  Lab Results      Component  Value  Date/Time        Prothrombin time  13.8  07/30/2019 08:40 AM         Prothrombin time  16.7 (H)  07/14/2019 11:55 AM        INR  1.0  07/30/2019 08:40 AM        INR  1.3  07/14/2019 11:55 AM        aPTT  39.2 (H)  07/30/2019 08:40 AM        aPTT  48.6 (H)  07/14/2019 11:55 AM              A1c  Lab Results      Component  Value  Date/Time        Hemoglobin A1c  5.5  07/14/2019 11:55 AM           Lipid Panel  Lab Results      Component  Value  Date/Time        Cholesterol, total  132  04/02/2019 04:07 PM        HDL Cholesterol  42  04/02/2019 04:07 PM        LDL, calculated  62  04/02/2019 04:07 PM        VLDL, calculated  28  04/02/2019 04:07 PM        Triglyceride  80  08/03/2019 02:03 PM           Thyroid Panel  Lab Results      Component  Value  Date/Time        TSH  1.100  08/03/2019 02:03 PM        TSH  1.010  04/02/2019 04:07 PM        T4, Free  1.0  08/03/2019 02:03 PM                Most Recent UA  Lab Results      Component  Value  Date/Time        Color  YELLOW  08/03/2019 01:07 PM        Appearance  CLEAR  08/03/2019 01:07 PM        Specific gravity  1.008  08/03/2019 01:07 PM        pH (UA)  6.0  08/03/2019 01:07 PM        Protein  Negative  08/03/2019 01:07 PM        Glucose  Negative  08/03/2019 01:07 PM        Ketone  15 (  A)  08/03/2019 01:07 PM        Bilirubin  Negative  08/03/2019 01:07 PM        Blood  Negative  08/03/2019 01:07 PM        Urobilinogen  0.2  08/03/2019 01:07 PM        Nitrites  Negative  08/03/2019 01:07 PM        Leukocyte Esterase  Negative  08/03/2019 01:07 PM                          Signed By:  Scarlett Presto, MD           August 08, 2019

## 2019-08-08 NOTE — Progress Notes (Signed)
Problem: Pressure Injury - Risk of  Goal: *Prevention of pressure injury  Description: Document Braden Scale and appropriate interventions in the flowsheet.  Outcome: Progressing Towards Goal  Note: Pressure Injury Interventions:  Sensory Interventions: Assess changes in LOC    Moisture Interventions: Absorbent underpads, Apply protective barrier, creams and emollients    Activity Interventions: Increase time out of bed    Mobility Interventions: Pressure redistribution bed/mattress (bed type)    Nutrition Interventions: Offer support with meals,snacks and hydration    Friction and Shear Interventions: Apply protective barrier, creams and emollients, Foam dressings/transparent film/skin sealants, HOB 30 degrees or less, Minimize layers                Problem: Falls - Risk of  Goal: *Absence of Falls  Description: Document Schmid Fall Risk and appropriate interventions in the flowsheet.  Outcome: Progressing Towards Goal  Note: Fall Risk Interventions:  Mobility Interventions: Bed/chair exit alarm    Mentation Interventions: Bed/chair exit alarm    Medication Interventions: Bed/chair exit alarm    Elimination Interventions: Call light in reach    History of Falls Interventions: Bed/chair exit alarm         Problem: Delirium Treatment  Goal: *Level of consciousness restored to baseline  Outcome: Progressing Towards Goal  Goal: *Level of environmental perceptions restored to baseline  Outcome: Progressing Towards Goal  Goal: *Sensory perception restored to baseline  Outcome: Progressing Towards Goal  Goal: *Emotional stability restored to baseline  Outcome: Progressing Towards Goal  Goal: *Functional assessment restored to baseline  Outcome: Progressing Towards Goal  Goal: *Absence of falls  Outcome: Progressing Towards Goal  Goal: *Will remain free of delirium, CAM Score negative  Outcome: Progressing Towards Goal  Goal: *Cognitive status will be restored to baseline  Outcome: Progressing Towards Goal  Goal:  Interventions  Outcome: Progressing Towards Goal

## 2019-08-09 LAB — CBC
Hematocrit: 22.9 % — ABNORMAL LOW (ref 35.8–46.3)
Hemoglobin: 7.7 g/dL — ABNORMAL LOW (ref 11.7–15.4)
MCH: 30.7 PG (ref 26.1–32.9)
MCHC: 33.6 g/dL (ref 31.4–35.0)
MCV: 91.2 FL (ref 79.6–97.8)
MPV: 9.2 FL — ABNORMAL LOW (ref 9.4–12.3)
NRBC Absolute: 0 10*3/uL (ref 0.0–0.2)
Platelets: 293 10*3/uL (ref 150–450)
RBC: 2.51 M/uL — ABNORMAL LOW (ref 4.05–5.2)
RDW: 14.6 % (ref 11.9–14.6)
WBC: 14.6 10*3/uL — ABNORMAL HIGH (ref 4.3–11.1)

## 2019-08-09 LAB — BASIC METABOLIC PANEL
Anion Gap: 6 mmol/L — ABNORMAL LOW (ref 7–16)
BUN: 21 MG/DL (ref 8–23)
CO2: 28 mmol/L (ref 21–32)
Calcium: 8 MG/DL — ABNORMAL LOW (ref 8.3–10.4)
Chloride: 100 mmol/L (ref 98–107)
Creatinine: 0.58 MG/DL — ABNORMAL LOW (ref 0.6–1.0)
EGFR IF NonAfrican American: >60 mL/min/{1.73_m2} (ref >60)
GFR African American: >60 mL/min/{1.73_m2} (ref >60)
Glucose: 112 mg/dL — ABNORMAL HIGH (ref 65–100)
Potassium: 4.1 mmol/L (ref 3.5–5.1)
Sodium: 134 mmol/L — ABNORMAL LOW (ref 136–145)

## 2019-08-09 LAB — CBC W/O DIFF
ABSOLUTE NRBC: 0 10*3/uL (ref 0.0–0.2)
HCT: 22.9 % — ABNORMAL LOW (ref 35.8–46.3)
HGB: 7.7 g/dL — ABNORMAL LOW (ref 11.7–15.4)
MCH: 30.7 PG (ref 26.1–32.9)
MCHC: 33.6 g/dL (ref 31.4–35.0)
MCV: 91.2 FL (ref 79.6–97.8)
MPV: 9.2 FL — ABNORMAL LOW (ref 9.4–12.3)
PLATELET: 293 10*3/uL (ref 150–450)
RBC: 2.51 M/uL — ABNORMAL LOW (ref 4.05–5.2)
RDW: 14.6 % (ref 11.9–14.6)
WBC: 14.6 10*3/uL — ABNORMAL HIGH (ref 4.3–11.1)

## 2019-08-09 LAB — METABOLIC PANEL, BASIC
Anion gap: 6 mmol/L — ABNORMAL LOW (ref 7–16)
BUN: 21 MG/DL (ref 8–23)
CO2: 28 mmol/L (ref 21–32)
Calcium: 8 MG/DL — ABNORMAL LOW (ref 8.3–10.4)
Chloride: 100 mmol/L (ref 98–107)
Creatinine: 0.58 MG/DL — ABNORMAL LOW (ref 0.6–1.0)
GFR est AA: >60 mL/min/{1.73_m2} (ref >60)
GFR est non-AA: >60 mL/min/{1.73_m2} (ref >60)
Glucose: 112 mg/dL — ABNORMAL HIGH (ref 65–100)
Potassium: 4.1 mmol/L (ref 3.5–5.1)
Sodium: 134 mmol/L — ABNORMAL LOW (ref 136–145)

## 2019-08-09 MED FILL — MAPAP (ACETAMINOPHEN) 325 MG TABLET: 325 mg | ORAL | Qty: 2

## 2019-08-09 MED FILL — SUCRALFATE 1 GRAM TAB: 1 gram | ORAL | Qty: 1

## 2019-08-09 MED FILL — METOPROLOL TARTRATE 50 MG TAB: 50 mg | ORAL | Qty: 1

## 2019-08-09 MED FILL — ELIQUIS 5 MG TABLET: 5 mg | ORAL | Qty: 1

## 2019-08-09 MED FILL — VALSARTAN 160 MG TAB: 160 mg | ORAL | Qty: 1

## 2019-08-09 MED FILL — TROSPIUM 20 MG TAB: 20 mg | ORAL | Qty: 1

## 2019-08-09 MED FILL — ATORVASTATIN 10 MG TAB: 10 mg | ORAL | Qty: 1

## 2019-08-09 MED FILL — FAMOTIDINE 20 MG TAB: 20 mg | ORAL | Qty: 1

## 2019-08-09 NOTE — Progress Notes (Signed)
Care Management Interventions  PCP Verified by CM: Yes  Last Visit to PCP: 07/10/19  Current Support Network: Lives Alone  Discharge Location  Discharge Placement: Unable to determine at this time  45 F hx L THA on 07/30/19 and went home with [facility].  Readmitted 8/2 with Acute hyponatremia which is resolved.  Patient wants to go home, and is walking 140 ft, but says her son wants her to go to rehab.  Awaiting response from [facility].  Pt agreeable to return home with [facility] if not approved for rehab.

## 2019-08-09 NOTE — Progress Notes (Signed)
Progress  Notes by Scarlett Presto, MD at 08/09/19 1258                Author: Scarlett Presto, MD  Service: Internal Medicine  Author Type: Physician       Filed: 08/09/19 1302  Date of Service: 08/09/19 1258  Status: Signed          Editor: Scarlett Presto, MD (Physician)                         [facility] Hospitalist Progress Note          Patient: Brandy Vargas  MRN: [id number]   SSN: [id number]          Date of Birth: 09/08/1940   Age: 79 y.o.   Sex: female         Admit Date: 08/03/2019     LOS: 6 days         Subjective:        Chief Complaint: Confusion   Reason for Admission: Acute hyponatremia      79 year old CF with a PMH of PAF, HTN, GERD, and h/o breast CA who presented to the ER with 3-4 days of nausea and vomiting and 24 hours of waxing and waning confusion  and hallucinating. She had a left THA on 07/30/19 and did well with the surgery. Per the son, the patient was nauseated and had some vomiting post-op. She continued to have this, but was discharged on 08/01/19. The patient went home and continued to have  vomiting and was unable to hold anything down. Then on the evening of 08/02/19 she became confused and was seeing ants on her walls. Her son got her comfortable in the chair and she fell asleep. On the morning of 08/03/19 he found the patient in the floor  so he called EMS. She was admitted for sodium 117 and anemia. She was transfused 1U PRBC. She became acutely confused and has persisted.      8/4 - She continues to be confused this AM. Not agitated, but hallucinating and not sure what she's saying. Thinks she is at the "[facility]". Reaching for her non-existent keys on her table. Says she slept well (didn't sleep much last night  per nursing). Denies CP/SOB. Denies N/V/D.      August 5: Patient is AOx3 today.  Eating her breakfast.  Son was present at bedside and as per son patient has started around 180 degrees.  Patient denies any nausea, vomiting, diarrhea, chest pain or shortness of  breath.      August 6: Patient is AOx3.  She ate her dinner and breakfast yesterday.  Patient son at bedside.  Denies any nausea, vomiting, chest pain or shortness of breath.  No bowel movement in last 1 week but no abdominal discomfort.      August 7: Patient is AOx3.  Denies any complaint.  Feeling much better.  Last bowel movement documented yesterday.  Denies any nausea, vomiting, chest pain or palpitations.      August 8: AOx3.  No complaints.  As per patient if she does not get rehab bed then she has a bed at home.  Denies any nausea, vomiting, chest pain or palpitations.      Review of systems negative except stated above.        Assessment:        Primary Diagnosis: Acute hyponatremia  Hospital Problems  as of 08/09/2019  Date Reviewed:  07/22/2019                     Codes  Class  Noted - Resolved  POA              * (Principal) Acute hyponatremia  ICD-10-CM: E87.1   ICD-9-CM: 276.1    08/03/2019 - Present  Yes                        ABLA (acute blood loss anemia)  ICD-10-CM: D62   ICD-9-CM: 285.1    08/03/2019 - Present  Yes                        History of total hip arthroplasty, left  ICD-10-CM: N82.956Z96.642   ICD-9-CM: V43.64    08/03/2019 - Present  Yes                        Leukocytosis  ICD-10-CM: D72.829   ICD-9-CM: 288.60    08/03/2019 - Present  Yes                        Pure hyperglyceridemia  ICD-10-CM: E78.1   ICD-9-CM: 272.1    04/28/2019 - Present  Yes                        Paroxysmal atrial fibrillation (HCC)  ICD-10-CM: I48.0   ICD-9-CM: 427.31    04/28/2019 - Present  Yes                        Essential hypertension  ICD-10-CM: I10   ICD-9-CM: 401.9    04/28/2019 - Present  Yes                        RESOLVED: Acute metabolic encephalopathy  ICD-10-CM: G93.41   ICD-9-CM: 348.31    08/03/2019 - 08/06/2019  Yes                        RESOLVED: Nausea and vomiting  ICD-10-CM: R11.2   ICD-9-CM: 787.01    08/03/2019 - 08/06/2019  Yes                            Active Medical Issues and Plan (MDM):         Principal Problem:     Acute hyponatremia (08/03/2019)   - Sodium 138 (07/14/19) --> 117 (08/03/19)   - Currently 117 --> 126 --> 131   - Likely due to vomiting and dehydration   - Stop normal saline   - UA with ketones, otherwise normal   - Urine sodium 17 + osm 236   - Serum osm 246   - TFTs unremarkable   - Triglycerides 80   - Trend sodium Q6H   - Strict I/O   ??   August 5: Sodium stable.  Patient is AOx3.  Discussed with the patient and patient's son to increase p.o. intake.  Will monitor.   Likely secondary to dehydration secondary to nausea vomiting.      Problem 6: Stable.  Will monitor.      August 8: Improved significantly.  Current sodium 134.  Likely secondary to dehydration.  Will  monitor.   Encouraged to increase p.o. intake.      Active Problems:     Atrial Fibrillation with RVR (08/04/2019)   - The patient flipped into Afib RVR - now back in NSR   - Off Cardizem gtt   - Continue Lopressor 50mg  BID   - Continue Eliquis 2.5 BID   - Give Lopressor IV 5mg  PRN if BP permits            August 6: Discussed with patient and patient's son.  Patient creatinine is less than 1.5 and weight is more than 60 kg.  Patient was taking Eliquis 5 mg twice daily at home.  Risk and benefit discussed and family wants patient to take Eliquis 5 mg twice  daily.  Does not have any stent in her body so we will stop aspirin.  Family in agreement.   Next  ABLA (acute blood loss anemia) (08/03/2019)   - Likely due to left THA   - Hgb 7.4 at discharge --> admit 6.6 --> 1U PRBC --> 7.9 --> 7.4   - Continue Eliquis due to flipping in/out of Afib   - Transfuse Hgb <7.0   ??   August 5: As patient received blood transfusion during hospitalization so we will check iron studies, B12 and folate level tomorrow morning.      August 6: Will monitor.        Acute metabolic encephalopathy (08/03/2019)   - Worse overnight   - Pleasantly confused   - Continue Seroquel 100mg  QHS   - Likely due to hyponatremia + delirium   - UA unremarkable   - TFTs  unremarkable   ??   August 5: Resolved.  Will change Seroquel to as needed.        Nausea and vomiting (08/03/2019)   - Resolved   - Unsure etiology --> has been happening since post-op per son   - No abdominal pain   - Start Zofran PRN   - Start Phenergan PRN   ??   August 5: Resolved.        Leukocytosis (08/03/2019)   - I suspect this is reactive   - Resolved   - Decrease normal saline   - UA unremarkable   August 5: Resolved.        Essential hypertension (04/28/2019)   - Stable   - Continue Lopressor   - Continue ARB   ??   August 6: given hypotension, will decrease the dose of ARB into half.        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     Paroxysmal atrial fibrillation (HCC) (04/28/2019)   - Currently NSR   - Continue Lopressor   - Continue low dose Eliquis      Otherwise all chronic medical issues appear stable with no changes.      Diet: ADULT ORAL NUTRITION SUPPLEMENT Breakfast; Clear Liquid   ADULT ORAL NUTRITION SUPPLEMENT Lunch; Standard High Calorie/High Protein   ADULT ORAL NUTRITION SUPPLEMENT Dinner; Frozen Supplement   ADULT DIET Regular   VTE ppx: Eliquis      Again, patient is AOx3.    I was informed that patient and family wants patient to go to rehab.  Social worker working on placement.  Patient has already reached maximal optimization of her inpatient stay.  Waiting for placement.  Patient's son will be  here  shortly person and she will asked the nurse to page me if she or her son has any questions.      I have updated patient's son regularly.        Objective:        Visit Vitals      BP  (!) 116/44 (BP 1 Location: Left upper arm, BP Patient Position: Sitting)     Pulse  72     Temp  98.2 ??F (36.8 ??C)     Resp  16     Ht  5\' 3"  (1.6 m)     Wt  89 kg (196 lb 3.4 oz)     SpO2  98%        BMI  34.76 kg/m??         Oxygen Therapy   O2 Sat (%): 98 % (08/09/19 1122)   Pulse via Oximetry: 72 beats per minute (08/05/19 1600)   O2  Device: None (Room air) (08/09/19 10/09/19)   Skin Assessment: Clean, dry, & intact (08/04/19 1922)   Skin Protection for O2 Device: No (08/04/19 1922)   O2 Flow Rate (L/min): 0 l/min (08/05/19 0755)         Intake and Output:    No intake or output data in the 24 hours ending 08/09/19 1258         Physical Exam:    GENERAL: AOx3, no distress, appears stated age   EYE: conjunctivae/corneas clear. PERRL.   THROAT & NECK: supple, no JVD.   LUNG: clear to auscultation bilaterally   HEART: tachy, irregular rate and rhythm, S1S2, no murmur, no JVD   ABDOMEN: soft, non-tender, non-distended. Bowel sounds normal.    EXTREMITIES:  No edema, capillary refill.   SKIN: no rash or abnormalities   NEUROLOGIC: AOx3, moving all 4 extremities, speech normal.      Lab/Data Review:   Procedures done this admission:   * No surgery found *        All Micro Results           None                  SARS-CoV-2 Lab Results     "Novel Coronavirus" Test: No results found for: COV2NT     "Emergent Disease" Test: No results found for: EDPR   "SARS-COV-2" Test: No results found for: XGCOVT   "Precision Labs" Test: No results found for: RSLT   Rapid Test: No results found for: COVR                 Labs:  Results:                  BMP, Mg, Phos  Recent Labs         08/09/19   0404  08/08/19   0420  08/07/19   1305      NA  134*  133*  133*      K  4.1  4.7  4.3      CL  100  100  100      CO2  28  28  28       AGAP  6*  5*  5*      BUN  21  24*  21      CREA  0.58*  0.75  0.65      CA  8.0*  8.7  7.9*      GLU  112*  110*  101*  CBC  Recent Labs         08/09/19   0404  08/08/19   0420  08/07/19   0424      WBC  14.6*  12.4*  11.1      RBC  2.51*  2.57*  2.62*      HGB  7.7*  7.7*  7.9*      HCT  22.9*  23.7*  23.6*      PLT  293  281  261                 LFT  No results for input(s): ALT, TBIL, AP, TP, ALB, GLOB, AGRAT in the last 72 hours.      No lab exists for component: SGOT, GPT     Cardiac Testing  No results found for: BNPP, BNP,  CPK, RCK1, RCK2, RCK3, RCK4, CKMB, CKNDX, CKND1, TROPT, TROIQ        Coagulation Tests  Lab Results      Component  Value  Date/Time        Prothrombin time  13.8  07/30/2019 08:40 AM        Prothrombin time  16.7 (H)  07/14/2019 11:55 AM        INR  1.0  07/30/2019 08:40 AM        INR  1.3  07/14/2019 11:55 AM        aPTT  39.2 (H)  07/30/2019 08:40 AM        aPTT  48.6 (H)  07/14/2019 11:55 AM              A1c  Lab Results      Component  Value  Date/Time        Hemoglobin A1c  5.5  07/14/2019 11:55 AM           Lipid Panel  Lab Results      Component  Value  Date/Time        Cholesterol, total  132  04/02/2019 04:07 PM        HDL Cholesterol  42  04/02/2019 04:07 PM        LDL, calculated  62  04/02/2019 04:07 PM        VLDL, calculated  28  04/02/2019 04:07 PM        Triglyceride  80  08/03/2019 02:03 PM           Thyroid Panel  Lab Results      Component  Value  Date/Time        TSH  1.100  08/03/2019 02:03 PM        TSH  1.010  04/02/2019 04:07 PM        T4, Free  1.0  08/03/2019 02:03 PM                Most Recent UA  Lab Results      Component  Value  Date/Time        Color  YELLOW  08/03/2019 01:07 PM        Appearance  CLEAR  08/03/2019 01:07 PM        Specific gravity  1.008  08/03/2019 01:07 PM        pH (UA)  6.0  08/03/2019 01:07 PM        Protein  Negative  08/03/2019 01:07 PM        Glucose  Negative  08/03/2019 01:07 PM  Ketone  15 (A)  08/03/2019 01:07 PM        Bilirubin  Negative  08/03/2019 01:07 PM        Blood  Negative  08/03/2019 01:07 PM        Urobilinogen  0.2  08/03/2019 01:07 PM        Nitrites  Negative  08/03/2019 01:07 PM        Leukocyte Esterase  Negative  08/03/2019 01:07 PM                          Signed By:  Scarlett Presto, MD           August 09, 2019

## 2019-08-09 NOTE — Progress Notes (Signed)
Problem: Pressure Injury - Risk of  Goal: *Prevention of pressure injury  Description: Document Braden Scale and appropriate interventions in the flowsheet.  Outcome: Progressing Towards Goal  Note: Pressure Injury Interventions:  Sensory Interventions: Assess changes in LOC    Moisture Interventions: Absorbent underpads, Apply protective barrier, creams and emollients    Activity Interventions: Increase time out of bed    Mobility Interventions: Pressure redistribution bed/mattress (bed type)    Nutrition Interventions: Offer support with meals,snacks and hydration    Friction and Shear Interventions: Apply protective barrier, creams and emollients, Foam dressings/transparent film/skin sealants, HOB 30 degrees or less, Minimize layers                Problem: Falls - Risk of  Goal: *Absence of Falls  Description: Document Schmid Fall Risk and appropriate interventions in the flowsheet.  Outcome: Progressing Towards Goal  Note: Fall Risk Interventions:  Mobility Interventions: Bed/chair exit alarm    Mentation Interventions: Bed/chair exit alarm    Medication Interventions: Bed/chair exit alarm    Elimination Interventions: Call light in reach    History of Falls Interventions: Bed/chair exit alarm

## 2019-08-09 NOTE — Progress Notes (Signed)
Medicated with Tylenol 650 mg PO as requested for headache, see MAR.

## 2019-08-09 NOTE — Progress Notes (Signed)
 Problem: Mobility Impaired (Adult and Pediatric)  Goal: *Acute Goals and Plan of Care (Insert Text)  Outcome: Progressing Towards Goal  Note: STG:  (1.)Brandy Vargas will move from supine to sit and sit to supine  with MODERATE ASSIST within 5 treatment day(s).   (2.)Brandy Vargas will transfer from bed to chair and chair to bed with MODERATE ASSIST using the least restrictive device within 5 treatment day(s).  Met 08/06/19  (3.)Brandy Vargas will ambulate with MODERATE ASSIST for 10 feet with the least restrictive device within 5 treatment day(s).  Met 08/06/19     LTG:  (1.)Brandy Vargas will move from supine to sit and sit to supine  in bed with CONTACT GUARD ASSIST within 10 treatment day(s).    (2.)Brandy Vargas will transfer from bed to chair and chair to bed with CONTACT GUARD ASSIST using the least restrictive device within 10 treatment day(s).  Goal met  (3.)Brandy Vargas will ambulate with CONTACT GUARD ASSIST for 50 feet with the least restrictive device within 10 treatment day(s). Goal met  NEW GOALS:  (4.)Brandy Vargas will ambulate up/down 5 steps with B HRs and CGA within 5 treatment days.  (5.)Brandy Vargas will ambulate 200' with supervision within 5 treatment days.  (6.)Brandy Vargas will transfer between the bed and the chair with supervision within 5 treatment days.  ________________________________________________________________________________________________       PHYSICAL THERAPY: Daily Note and AM 08/09/2019  INPATIENT: PT Visit Days : 5  Payor: AARP MEDICARE COMPLETE / Plan: [facility] AARP MEDICARE COMPLETE / Product Type: Managed Care Medicare /       NAME/AGE/GENDER: Brandy Vargas is a 79 y.o. female   PRIMARY DIAGNOSIS: Acute hyponatremia [E87.1]  ABLA (acute blood loss anemia) [D62]  Acute metabolic encephalopathy [G93.41] Acute hyponatremia Acute hyponatremia       ICD-10: Treatment Diagnosis:    Generalized Muscle Weakness (M62.81)  Other lack of cordination (R27.8)  Difficulty in walking, Not elsewhere  classified (R26.2)   Precaution/Allergies:  Omeprazole magnesium      ASSESSMENT:     Brandy Vargas presents with above diagnoses.  Patient underwent L THA on 07/30/19.  She d/c'd home with assist from her son on 08/01/19.  At time of d/c, she was ambulatory with SBA and was able to negotiate steps with B rails.  Son reports patient did not do well once she returned home with reports of weakness and inability to walk.  She also developed significant confusion.      Patient up in chair and reports she got herself out of bed this am.  Patient really wanting to d/c home but reports son wants SNF.  Patient did better with exercises this am.  SBA for all transfers and mobility and able to perform toileting with assist only to pull up brief.  Ambulated increased distance with therapist and with improved gait sequence-equal step length and consistent movement of RW.    This section established at most recent assessment   PROBLEM LIST (Impairments causing functional limitations):  Decreased Strength  Decreased ADL/Functional Activities  Decreased Transfer Abilities  Decreased Ambulation Ability/Technique  Decreased Balance  Increased Pain  Decreased Activity Tolerance  Decreased Flexibility/Joint Mobility  Edema/Girth  Decreased Knowledge of Precautions  Decreased Independence with Home Exercise Program  Decreased Cognition   INTERVENTIONS PLANNED: (Benefits and precautions of physical therapy have been discussed with the patient.)  Balance Exercise  Bed Mobility  Family Education  Investment banker, operational  Home Exercise Program (HEP)  Therapeutic Activites  Therapeutic Exercise/Strengthening  Transfer Training     TREATMENT PLAN: Frequency/Duration: daily for duration of hospital stay  Rehabilitation Potential For Stated Goals: Good     REHAB RECOMMENDATIONS (at time of discharge pending progress):    Placement:  It is my opinion, based on this patient's performance to date, that Brandy Vargas may benefit from intensive therapy at a  [facility] after discharge due to the functional deficits listed above that are likely to improve with skilled rehabilitation and concerns that he/she may be unsafe to be unsupervised at home due to confusion .  Equipment:   None at this time              HISTORY:   History of Present Injury/Illness (Reason for Referral):  Brandy Vargas is a 79 year old CF with a PMH of PAF, HTN, GERD, and h/o breast CA who presented to the ER with 3-4 days of nausea and vomiting and 24 hours of waxing and waning confusion and hallucinating. She had a left THA on 07/30/19 and did well with the surgery. Per the son, the patient was nauseated and had some vomiting post-op. She continued to have this, but was discharged on 08/01/19. The patient went home and continued to have vomiting and was unable to hold anything down. Then on the evening of 08/02/19 she became confused and was seeing ants on her walls. Her son got her comfortable in the chair and she fell asleep. On the morning of 08/03/19 he found the patient in the floor so he called EMS. Complains of indigestion. Denies CP/SOB. Denies dizziness. Denies diarrhea. Denies abdominal pain.  Past Medical History/Comorbidities:   Brandy Vargas  has a past medical history of Arthritis, Atrial fibrillation (HCC), Breast cancer (HCC) (2015), COVID-19 vaccine series completed (04/08/2019), GERD (gastroesophageal reflux disease), Hypertension, and Overactive bladder. She also has no past medical history of Malignant hyperthermia due to anesthesia or Pseudocholinesterase deficiency.  Brandy Vargas  has a past surgical history that includes hx appendectomy; hx tubal ligation (1976); pr breast surgery procedure unlisted (Right, 2015); hx knee arthroscopy; and hx breast lumpectomy (Right).  Social History/Living Environment:   Home Environment: Private residence  # Steps to Enter: 5  One/Two Story Residence: One story  Living Alone: Yes  Support Systems: Child(ren)  Patient Expects to be  Discharged to:: Unknown  Current DME Used/Available at Home: Environmental consultant, rollator, [place], straight  Prior Level of Function/Work/Activity:  THA 07/30/19.  Independent prior to THA.     Number of Personal Factors/Comorbidities that affect the Plan of Care: 1-2: MODERATE COMPLEXITY   EXAMINATION:   Most Recent Physical Functioning:   Gross Assessment:  AROM: Generally decreased, functional  Strength: Generally decreased, functional  Coordination: Within functional limits               Posture:     Balance:  Sitting: Intact  Standing: With support Bed Mobility:     Wheelchair Mobility:     Transfers:  Sit to Stand: Stand-by assistance  Stand to Sit: Stand-by assistance  Gait:  Left Side Weight Bearing: As tolerated  Speed/Cadence: Pace decreased (<100 feet/min)  Step Length: Left shortened;Right shortened  Stance: Left decreased  Gait Abnormalities: Antalgic  Distance (ft): 140 Feet (ft)  Assistive Device: Walker, rolling  Ambulation - Level of Assistance: Stand-by assistance  Interventions: Safety awareness training;Verbal cues      Body Structures Involved:  Joints  Muscles Body Functions Affected:  Movement Related Activities and Participation Affected:  Mobility   Number of elements that affect the Plan of Care: 4+: HIGH COMPLEXITY   CLINICAL PRESENTATION:   Presentation: Evolving clinical presentation with changing clinical characteristics: MODERATE COMPLEXITY   CLINICAL DECISION MAKING:   [employer] AM-PACT "6 Clicks"   Basic Mobility Inpatient Short Form  How much difficulty does the patient currently have... Unable A Lot A Little None   1.  Turning over in bed (including adjusting bedclothes, sheets and blankets)?   []  1   [x]  2   []  3   []  4   2.  Sitting down on and standing up from a chair with arms ( e.g., wheelchair, bedside commode, etc.)   []  1   [x]  2   []  3   []  4   3.  Moving from lying on back to sitting on the side of the bed?   []  1   [x]  2   []  3   []  4   How much help from another person does  the patient currently need... Total A Lot A Little None   4.  Moving to and from a bed to a chair (including a wheelchair)?   []  1   [x]  2   []  3   []  4   5.  Need to walk in hospital room?   []  1   [x]  2   []  3   []  4   6.  Climbing 3-5 steps with a railing?   [x]  1   []  2   []  3   []  4    2007, Trustees of [address], under license to [place], [place]. All rights reserved      Score:  Initial: 11 Most Recent: X (Date: -- )    Interpretation of Tool:  Represents activities that are increasingly more difficult (i.e. Bed mobility, Transfers, Gait).    Medical Necessity:     Patient is expected to demonstrate progress in   strength, range of motion, balance, coordination, and functional technique   to   decrease assistance required with mobility.  .  Reason for Services/Other Comments:  Patient continues to require skilled intervention due to   Weakness and confusion limiting mobility.  .   Use of outcome tool(s) and clinical judgement create a POC that gives a: Questionable prediction of patient's progress: MODERATE COMPLEXITY            TREATMENT:   (In addition to Assessment/Re-Assessment sessions the following treatments were rendered)   Pre-treatment Symptoms/Complaints:  Patient agreeable.  Really wants to d/c home.  Pain: Initial:   Pain Intensity 1: 4  Post Session:  4     Therapeutic Activity: (    30 minutes):  Therapeutic activities including  chair transfers, toilet transfers, standing balance for hygiene, and ambulation on level ground, exercises as belwo to improve mobility, strength, balance, and coordination.  Required moderate Safety awareness training;Verbal cues to promote static and dynamic balance in sitting and standing and promote coordination of bilateral, lower extremity(s).   Therapeutic Exercise: ( ):  Exercises per grid below to improve strength.  Required minimal verbal cues to promote proper body alignment.      Date:  08/09/19 Date:   Date:     Activity/Exercise Parameters Parameters  Parameters   Ankle pumps 20     Quad sets 20     Gluteal squeeze 20     Hip abduction/adduction 20     Heel slides 20 AA  Short arc quad 20     Long arc quad 20     Seated marches            Braces/Orthotics/Lines/Etc:   O2 Device: None (Room air)  Treatment/Session Assessment:    Response to Treatment:  Patient participated well and moving well.  Increased gait distance and improved gait pattern.  Interdisciplinary Collaboration:   Physical Therapist  Registered Nurse  After treatment position/precautions:   Up in chair  Bed/Chair-wheels locked  Call light within reach   Compliance with Program/Exercises: Will assess as treatment progresses  Recommendations/Intent for next treatment session:  Next visit will focus on advancements to more challenging activities and reduction in assistance provided.  Total Treatment Duration:  PT Patient Time In/Time Out  Time In: 0850  Time Out: 0920    Dwayne JINNY Fuelling, PT

## 2019-08-10 LAB — COVID-19

## 2019-08-10 LAB — SARS-COV-2

## 2019-08-10 MED ORDER — TELMISARTAN 80 MG TAB
80 mg | ORAL_TABLET | ORAL | 0 refills | Status: DC
Start: 2019-08-10 — End: 2019-11-30

## 2019-08-10 MED FILL — ELIQUIS 5 MG TABLET: 5 mg | ORAL | Qty: 1

## 2019-08-10 MED FILL — SUCRALFATE 1 GRAM TAB: 1 gram | ORAL | Qty: 1

## 2019-08-10 MED FILL — TROSPIUM 20 MG TAB: 20 mg | ORAL | Qty: 1

## 2019-08-10 MED FILL — ATORVASTATIN 10 MG TAB: 10 mg | ORAL | Qty: 1

## 2019-08-10 MED FILL — STOOL SOFTENER-STIMULANT LAXATIVE 8.6 MG-50 MG TABLET: ORAL | Qty: 2

## 2019-08-10 MED FILL — FAMOTIDINE 20 MG TAB: 20 mg | ORAL | Qty: 1

## 2019-08-10 MED FILL — MAPAP (ACETAMINOPHEN) 325 MG TABLET: 325 mg | ORAL | Qty: 2

## 2019-08-10 MED FILL — METOPROLOL TARTRATE 50 MG TAB: 50 mg | ORAL | Qty: 1

## 2019-08-10 MED FILL — VALSARTAN 160 MG TAB: 160 mg | ORAL | Qty: 1

## 2019-08-10 NOTE — Progress Notes (Signed)
 Problem: Mobility Impaired (Adult and Pediatric)  Goal: *Acute Goals and Plan of Care (Insert Text)  Outcome: Progressing Towards Goal  Note: STG:  (1.)Brandy Vargas will move from supine to sit and sit to supine  with MODERATE ASSIST within 5 treatment day(s).   (2.)Brandy Vargas will transfer from bed to chair and chair to bed with MODERATE ASSIST using the least restrictive device within 5 treatment day(s).  Met 08/06/19  (3.)Brandy Vargas will ambulate with MODERATE ASSIST for 10 feet with the least restrictive device within 5 treatment day(s).  Met 08/06/19     LTG:  (1.)Brandy Vargas will move from supine to sit and sit to supine  in bed with CONTACT GUARD ASSIST within 10 treatment day(s).    (2.)Brandy Vargas will transfer from bed to chair and chair to bed with CONTACT GUARD ASSIST using the least restrictive device within 10 treatment day(s).  Goal met  (3.)Brandy Vargas will ambulate with CONTACT GUARD ASSIST for 50 feet with the least restrictive device within 10 treatment day(s). Goal met  NEW GOALS:  (4.)Brandy Vargas will ambulate up/down 5 steps with B HRs and CGA within 5 treatment days.  (5.)Brandy Vargas will ambulate 200' with supervision within 5 treatment days.  (6.)Brandy Vargas will transfer between the bed and the chair with supervision within 5 treatment days.  ________________________________________________________________________________________________       PHYSICAL THERAPY: Daily Note and PM 08/10/2019  INPATIENT: PT Visit Days : 6  Payor: AARP MEDICARE COMPLETE / Plan: [facility] AARP MEDICARE COMPLETE / Product Type: Managed Care Medicare /       NAME/AGE/GENDER: Brandy Vargas is a 79 y.o. female   PRIMARY DIAGNOSIS: Acute hyponatremia [E87.1]  ABLA (acute blood loss anemia) [D62]  Acute metabolic encephalopathy [G93.41] Acute hyponatremia Acute hyponatremia       ICD-10: Treatment Diagnosis:    Generalized Muscle Weakness (M62.81)  Other lack of cordination (R27.8)  Difficulty in walking, Not elsewhere  classified (R26.2)   Precaution/Allergies:  Omeprazole magnesium      ASSESSMENT:     Brandy Vargas presents with above diagnoses.  Patient underwent L THA on 07/30/19.  She d/c'd home with assist from her son on 08/01/19.  At time of d/c, she was ambulatory with SBA and was able to negotiate steps with B rails.  Son reports patient did not do well once she returned home with reports of weakness and inability to walk.  She also developed significant confusion.      Patient up in chair and reports she got herself out of bed this am.  Patient really wanting to d/c home but reports son wants SNF.  Patient did better with exercises this am.  SBA for all transfers and mobility and able to perform toileting with assist only to pull up brief.  Ambulated increased distance with therapist and with improved gait sequence-equal step length and consistent movement of RW.  8/9 sitting in the recliner upon arrival.  Performs L TH exercises with guidance.  Sit>stand SBA stood few min.  Then walk 200 ft using RW with SBA, stated I am not out of breath, like I was yesterday.  Return to recliner with needs in reach.  8/9 pm sitting in the recliner upon arrival.  Performs L TH exercises with guidance.  Walk 150 ft using RW with SBA.  Return to the recliner with needs in reach and call for assistant before getting up.    This section established at most recent assessment  PROBLEM LIST (Impairments causing functional limitations):  Decreased Strength  Decreased ADL/Functional Activities  Decreased Transfer Abilities  Decreased Ambulation Ability/Technique  Decreased Balance  Increased Pain  Decreased Activity Tolerance  Decreased Flexibility/Joint Mobility  Edema/Girth  Decreased Knowledge of Precautions  Decreased Independence with Home Exercise Program  Decreased Cognition   INTERVENTIONS PLANNED: (Benefits and precautions of physical therapy have been discussed with the patient.)  Balance Exercise  Bed Mobility  Family Education  Gait  Training  Home Exercise Program (HEP)  Therapeutic Activites  Therapeutic Exercise/Strengthening  Transfer Training     TREATMENT PLAN: Frequency/Duration: daily for duration of hospital stay  Rehabilitation Potential For Stated Goals: Good     REHAB RECOMMENDATIONS (at time of discharge pending progress):    Placement:  It is my opinion, based on this patient's performance to date, that Brandy Vargas may benefit from intensive therapy at a [facility] FACILITY after discharge due to the functional deficits listed above that are likely to improve with skilled rehabilitation and concerns that he/she may be unsafe to be unsupervised at home due to confusion .  Equipment:   None at this time              HISTORY:   History of Present Injury/Illness (Reason for Referral):  Brandy Vargas is a 79 year old CF with a PMH of PAF, HTN, GERD, and h/o breast CA who presented to the ER with 3-4 days of nausea and vomiting and 24 hours of waxing and waning confusion and hallucinating. She had a left THA on 07/30/19 and did well with the surgery. Per the son, the patient was nauseated and had some vomiting post-op. She continued to have this, but was discharged on 08/01/19. The patient went home and continued to have vomiting and was unable to hold anything down. Then on the evening of 08/02/19 she became confused and was seeing ants on her walls. Her son got her comfortable in the chair and she fell asleep. On the morning of 08/03/19 he found the patient in the floor so he called EMS. Complains of indigestion. Denies CP/SOB. Denies dizziness. Denies diarrhea. Denies abdominal pain.  Past Medical History/Comorbidities:   Brandy Vargas  has a past medical history of Arthritis, Atrial fibrillation (HCC), Breast cancer (HCC) (2015), COVID-19 vaccine series completed (04/08/2019), GERD (gastroesophageal reflux disease), Hypertension, and Overactive bladder. She also has no past medical history of Malignant hyperthermia due to anesthesia  or Pseudocholinesterase deficiency.  Brandy Vargas  has a past surgical history that includes hx appendectomy; hx tubal ligation (1976); pr breast surgery procedure unlisted (Right, 2015); hx knee arthroscopy; and hx breast lumpectomy (Right).  Social History/Living Environment:   Home Environment: Private residence  # Steps to Enter: 5  One/Two Story Residence: One story  Living Alone: Yes  Support Systems: Child(ren)  Patient Expects to be Discharged to:: Unknown  Current DME Used/Available at Home: Environmental consultant, rollator, [place], straight  Prior Level of Function/Work/Activity:  THA 07/30/19.  Independent prior to THA.     Number of Personal Factors/Comorbidities that affect the Plan of Care: 1-2: MODERATE COMPLEXITY   EXAMINATION:   Most Recent Physical Functioning:   Gross Assessment:                  Posture:     Balance:  Sitting: Intact  Standing: With support Bed Mobility:     Wheelchair Mobility:     Transfers:  Sit to Stand: Stand-by assistance  Stand to Sit: Stand-by  assistance  Duration: 30 Minutes  Gait:  Left Side Weight Bearing: As tolerated  Speed/Cadence: Pace decreased (<100 feet/min)  Step Length: Left shortened;Right shortened  Stance: Left decreased  Distance (ft): 150 Feet (ft)  Assistive Device: Walker, rolling  Ambulation - Level of Assistance: Stand-by assistance  Interventions: Safety awareness training      Body Structures Involved:  Joints  Muscles Body Functions Affected:  Movement Related Activities and Participation Affected:  Mobility   Number of elements that affect the Plan of Care: 4+: HIGH COMPLEXITY   CLINICAL PRESENTATION:   Presentation: Evolving clinical presentation with changing clinical characteristics: MODERATE COMPLEXITY   CLINICAL DECISION MAKING:   [employer] AM-PACT "6 Clicks"   Basic Mobility Inpatient Short Form  How much difficulty does the patient currently have... Unable A Lot A Little None   1.  Turning over in bed (including adjusting bedclothes, sheets and  blankets)?   []  1   [x]  2   []  3   []  4   2.  Sitting down on and standing up from a chair with arms ( e.g., wheelchair, bedside commode, etc.)   []  1   [x]  2   []  3   []  4   3.  Moving from lying on back to sitting on the side of the bed?   []  1   [x]  2   []  3   []  4   How much help from another person does the patient currently need... Total A Lot A Little None   4.  Moving to and from a bed to a chair (including a wheelchair)?   []  1   [x]  2   []  3   []  4   5.  Need to walk in hospital room?   []  1   [x]  2   []  3   []  4   6.  Climbing 3-5 steps with a railing?   [x]  1   []  2   []  3   []  4    2007, Trustees of [address], under license to [place], [place]. All rights reserved      Score:  Initial: 11 Most Recent: X (Date: -- )    Interpretation of Tool:  Represents activities that are increasingly more difficult (i.e. Bed mobility, Transfers, Gait).    Medical Necessity:     Patient is expected to demonstrate progress in   strength, range of motion, balance, coordination, and functional technique   to   decrease assistance required with mobility.  .  Reason for Services/Other Comments:  Patient continues to require skilled intervention due to   Weakness and confusion limiting mobility.  .   Use of outcome tool(s) and clinical judgement create a POC that gives a: Questionable prediction of patient's progress: MODERATE COMPLEXITY            TREATMENT:   (In addition to Assessment/Re-Assessment sessions the following treatments were rendered)   Pre-treatment Symptoms/Complaints:  Patient agreeable.    Pain: Initial:   Pain Intensity 1: 0  Post Session:     Therapeutic Activity: (  30 Minutes ):  Therapeutic activities including  chair transfers, toilet transfers, standing balance for hygiene, and ambulation on level ground, exercises as belwo to improve mobility, strength, balance, and coordination.  Required moderate Safety awareness training to promote static and dynamic balance in sitting and standing and  promote coordination of bilateral, lower extremity(s).   Therapeutic Exercise: (0 Minutes):  Exercises per grid below to  improve strength.  Required minimal verbal cues to promote proper body alignment.     20 Date:  08/09/19 Date:  8/9   Date:  8/9 pm     Activity/Exercise Parameters Parameters Parameters   Ankle pumps 20 20 20    Quad sets 20 20 20    Gluteal squeeze 20 20 20    Hip abduction/adduction 20 20 20    Heel slides 20 AA 20 aa 20 b   Short arc quad 20 20   20    Long arc quad 20 20 20    Seated marches            Braces/Orthotics/Lines/Etc:   O2 Device: None (Room air)  Treatment/Session Assessment:    Response to Treatment:  Patient participated well  Interdisciplinary Collaboration:   Physical Therapy Assistant  Registered Nurse  After treatment position/precautions:   Up in chair  Bed/Chair-wheels locked  Call light within reach   Compliance with Program/Exercises: Will assess as treatment progresses  Recommendations/Intent for next treatment session:  Next visit will focus on advancements to more challenging activities and reduction in assistance provided.  Total Treatment Duration:  PT Patient Time In/Time Out  Time In: 1335  Time Out: 1405    Lindy C Zeager, PTA

## 2019-08-10 NOTE — Progress Notes (Signed)
 Problem: Mobility Impaired (Adult and Pediatric)  Goal: *Acute Goals and Plan of Care (Insert Text)  Outcome: Progressing Towards Goal  Note: STG:  (1.)Brandy Vargas will move from supine to sit and sit to supine  with MODERATE ASSIST within 5 treatment day(s).   (2.)Brandy Vargas will transfer from bed to chair and chair to bed with MODERATE ASSIST using the least restrictive device within 5 treatment day(s).  Met 08/06/19  (3.)Brandy Vargas will ambulate with MODERATE ASSIST for 10 feet with the least restrictive device within 5 treatment day(s).  Met 08/06/19     LTG:  (1.)Brandy Vargas will move from supine to sit and sit to supine  in bed with CONTACT GUARD ASSIST within 10 treatment day(s).    (2.)Brandy Vargas will transfer from bed to chair and chair to bed with CONTACT GUARD ASSIST using the least restrictive device within 10 treatment day(s).  Goal met  (3.)Brandy Vargas will ambulate with CONTACT GUARD ASSIST for 50 feet with the least restrictive device within 10 treatment day(s). Goal met  NEW GOALS:  (4.)Brandy Vargas will ambulate up/down 5 steps with B HRs and CGA within 5 treatment days.  (5.)Brandy Vargas will ambulate 200' with supervision within 5 treatment days.  (6.)Brandy Vargas will transfer between the bed and the chair with supervision within 5 treatment days.  ________________________________________________________________________________________________       PHYSICAL THERAPY: Daily Note and AM 08/10/2019  INPATIENT: PT Visit Days : 6  Payor: AARP MEDICARE COMPLETE / Plan: [facility] AARP MEDICARE COMPLETE / Product Type: Managed Care Medicare /       NAME/AGE/GENDER: Brandy Vargas is a 79 y.o. female   PRIMARY DIAGNOSIS: Acute hyponatremia [E87.1]  ABLA (acute blood loss anemia) [D62]  Acute metabolic encephalopathy [G93.41] Acute hyponatremia Acute hyponatremia       ICD-10: Treatment Diagnosis:    Generalized Muscle Weakness (M62.81)  Other lack of cordination (R27.8)  Difficulty in walking, Not elsewhere  classified (R26.2)   Precaution/Allergies:  Omeprazole magnesium      ASSESSMENT:     Brandy Vargas presents with above diagnoses.  Patient underwent L THA on 07/30/19.  She d/c'd home with assist from her son on 08/01/19.  At time of d/c, she was ambulatory with SBA and was able to negotiate steps with B rails.  Son reports patient did not do well once she returned home with reports of weakness and inability to walk.  She also developed significant confusion.      Patient up in chair and reports she got herself out of bed this am.  Patient really wanting to d/c home but reports son wants SNF.  Patient did better with exercises this am.  SBA for all transfers and mobility and able to perform toileting with assist only to pull up brief.  Ambulated increased distance with therapist and with improved gait sequence-equal step length and consistent movement of RW.  8/9 sitting in the recliner upon arrival.  Performs L TH exercises with guidance.  Sit>stand SBA stood few min.  Then walk 200 ft using RW with SBA, stated I am not out of breath, like I was yesterday.  Return to recliner with needs in reach.    This section established at most recent assessment   PROBLEM LIST (Impairments causing functional limitations):  Decreased Strength  Decreased ADL/Functional Activities  Decreased Transfer Abilities  Decreased Ambulation Ability/Technique  Decreased Balance  Increased Pain  Decreased Activity Tolerance  Decreased Flexibility/Joint Mobility  Edema/Girth  Decreased Knowledge  of Precautions  Decreased Independence with Home Exercise Program  Decreased Cognition   INTERVENTIONS PLANNED: (Benefits and precautions of physical therapy have been discussed with the patient.)  Balance Exercise  Bed Mobility  Family Education  Gait Training  Home Exercise Program (HEP)  Therapeutic Activites  Therapeutic Exercise/Strengthening  Transfer Training     TREATMENT PLAN: Frequency/Duration: daily for duration of hospital  stay  Rehabilitation Potential For Stated Goals: Good     REHAB RECOMMENDATIONS (at time of discharge pending progress):    Placement:  It is my opinion, based on this patient's performance to date, that Brandy Vargas may benefit from intensive therapy at a [facility] FACILITY after discharge due to the functional deficits listed above that are likely to improve with skilled rehabilitation and concerns that he/she may be unsafe to be unsupervised at home due to confusion .  Equipment:   None at this time              HISTORY:   History of Present Injury/Illness (Reason for Referral):  Brandy Vargas is a 79 year old CF with a PMH of PAF, HTN, GERD, and h/o breast CA who presented to the ER with 3-4 days of nausea and vomiting and 24 hours of waxing and waning confusion and hallucinating. She had a left THA on 07/30/19 and did well with the surgery. Per the son, the patient was nauseated and had some vomiting post-op. She continued to have this, but was discharged on 08/01/19. The patient went home and continued to have vomiting and was unable to hold anything down. Then on the evening of 08/02/19 she became confused and was seeing ants on her walls. Her son got her comfortable in the chair and she fell asleep. On the morning of 08/03/19 he found the patient in the floor so he called EMS. Complains of indigestion. Denies CP/SOB. Denies dizziness. Denies diarrhea. Denies abdominal pain.  Past Medical History/Comorbidities:   Brandy Vargas  has a past medical history of Arthritis, Atrial fibrillation (HCC), Breast cancer (HCC) (2015), COVID-19 vaccine series completed (04/08/2019), GERD (gastroesophageal reflux disease), Hypertension, and Overactive bladder. She also has no past medical history of Malignant hyperthermia due to anesthesia or Pseudocholinesterase deficiency.  Brandy Vargas  has a past surgical history that includes hx appendectomy; hx tubal ligation (1976); pr breast surgery procedure unlisted (Right, 2015);  hx knee arthroscopy; and hx breast lumpectomy (Right).  Social History/Living Environment:   Home Environment: Private residence  # Steps to Enter: 5  One/Two Story Residence: One story  Living Alone: Yes  Support Systems: Child(ren)  Patient Expects to be Discharged to:: Unknown  Current DME Used/Available at Home: Environmental consultant, rollator, [place], straight  Prior Level of Function/Work/Activity:  THA 07/30/19.  Independent prior to THA.     Number of Personal Factors/Comorbidities that affect the Plan of Care: 1-2: MODERATE COMPLEXITY   EXAMINATION:   Most Recent Physical Functioning:   Gross Assessment:                  Posture:     Balance:  Sitting: Intact  Standing: With support Bed Mobility:     Wheelchair Mobility:     Transfers:  Sit to Stand: Stand-by assistance  Stand to Sit: Stand-by assistance  Duration: 30 Minutes  Gait:  Left Side Weight Bearing: As tolerated  Speed/Cadence: Pace decreased (<100 feet/min)  Step Length: Left shortened;Right shortened  Stance: Left decreased  Distance (ft): 200 Feet (ft)  Assistive Device: Vannie,  rolling  Ambulation - Level of Assistance: Stand-by assistance  Interventions: Safety awareness training      Body Structures Involved:  Joints  Muscles Body Functions Affected:  Movement Related Activities and Participation Affected:  Mobility   Number of elements that affect the Plan of Care: 4+: HIGH COMPLEXITY   CLINICAL PRESENTATION:   Presentation: Evolving clinical presentation with changing clinical characteristics: MODERATE COMPLEXITY   CLINICAL DECISION MAKING:   [employer] AM-PACT "6 Clicks"   Basic Mobility Inpatient Short Form  How much difficulty does the patient currently have... Unable A Lot A Little None   1.  Turning over in bed (including adjusting bedclothes, sheets and blankets)?   []  1   [x]  2   []  3   []  4   2.  Sitting down on and standing up from a chair with arms ( e.g., wheelchair, bedside commode, etc.)   []  1   [x]  2   []  3   []  4   3.  Moving from  lying on back to sitting on the side of the bed?   []  1   [x]  2   []  3   []  4   How much help from another person does the patient currently need... Total A Lot A Little None   4.  Moving to and from a bed to a chair (including a wheelchair)?   []  1   [x]  2   []  3   []  4   5.  Need to walk in hospital room?   []  1   [x]  2   []  3   []  4   6.  Climbing 3-5 steps with a railing?   [x]  1   []  2   []  3   []  4    2007, Trustees of [address], under license to [place], [place]. All rights reserved      Score:  Initial: 11 Most Recent: X (Date: -- )    Interpretation of Tool:  Represents activities that are increasingly more difficult (i.e. Bed mobility, Transfers, Gait).    Medical Necessity:     Patient is expected to demonstrate progress in   strength, range of motion, balance, coordination, and functional technique   to   decrease assistance required with mobility.  .  Reason for Services/Other Comments:  Patient continues to require skilled intervention due to   Weakness and confusion limiting mobility.  .   Use of outcome tool(s) and clinical judgement create a POC that gives a: Questionable prediction of patient's progress: MODERATE COMPLEXITY            TREATMENT:   (In addition to Assessment/Re-Assessment sessions the following treatments were rendered)   Pre-treatment Symptoms/Complaints:  Patient agreeable.    Pain: Initial:   Pain Intensity 1: 0 (same after therapy)  Post Session:     Therapeutic Activity: (  30 Minutes ):  Therapeutic activities including  chair transfers, toilet transfers, standing balance for hygiene, and ambulation on level ground, exercises as belwo to improve mobility, strength, balance, and coordination.  Required moderate Safety awareness training to promote static and dynamic balance in sitting and standing and promote coordination of bilateral, lower extremity(s).   Therapeutic Exercise: (0 Minutes):  Exercises per grid below to improve strength.  Required minimal verbal cues to  promote proper body alignment.      Date:  08/09/19 Date:  8/9   Date:     Activity/Exercise Parameters Parameters Parameters   Ankle  pumps 20 20    Quad sets 20 20    Gluteal squeeze 20 20    Hip abduction/adduction 20 20    Heel slides 20 AA 20 aa    Short arc quad 20 20    Long arc quad 20 20    Seated marches            Braces/Orthotics/Lines/Etc:   O2 Device: None (Room air)  Treatment/Session Assessment:    Response to Treatment:  Patient participated well and moving well.   Interdisciplinary Collaboration:   Physical Therapy Assistant  Registered Nurse  After treatment position/precautions:   Up in chair  Bed/Chair-wheels locked  Call light within reach   Compliance with Program/Exercises: Will assess as treatment progresses  Recommendations/Intent for next treatment session:  Next visit will focus on advancements to more challenging activities and reduction in assistance provided.  Total Treatment Duration:  PT Patient Time In/Time Out  Time In: 0800  Time Out: 0830    Lindy C Zeager, PTA

## 2019-08-10 NOTE — Progress Notes (Signed)
Problem: Pressure Injury - Risk of  Goal: *Prevention of pressure injury  Description: Document Braden Scale and appropriate interventions in the flowsheet.  Outcome: Progressing Towards Goal  Note: Pressure Injury Interventions:  Sensory Interventions: Assess changes in LOC    Moisture Interventions: Check for incontinence Q2 hours and as needed, Absorbent underpads    Activity Interventions: PT/OT evaluation, Pressure redistribution bed/mattress(bed type), Increase time out of bed    Mobility Interventions: PT/OT evaluation, Pressure redistribution bed/mattress (bed type)    Nutrition Interventions: Offer support with meals,snacks and hydration    Friction and Shear Interventions: Apply protective barrier, creams and emollients, Foam dressings/transparent film/skin sealants, HOB 30 degrees or less, Minimize layers                Problem: Falls - Risk of  Goal: *Absence of Falls  Description: Document Schmid Fall Risk and appropriate interventions in the flowsheet.  Outcome: Progressing Towards Goal  Note: Fall Risk Interventions:  Mobility Interventions: OT consult for ADLs, Patient to call before getting OOB, PT Consult for mobility concerns, PT Consult for assist device competence, Strengthening exercises (ROM-active/passive), Utilize walker, cane, or other assistive device    Mentation Interventions: Bed/chair exit alarm    Medication Interventions: Assess postural VS orthostatic hypotension, Patient to call before getting OOB, Teach patient to arise slowly    Elimination Interventions: Call light in reach, Patient to call for help with toileting needs, Toileting schedule/hourly rounds    History of Falls Interventions: Door open when patient unattended, Room close to nurse's station         Problem: Delirium Treatment  Goal: *Level of consciousness restored to baseline  Outcome: Progressing Towards Goal  Goal: *Level of environmental perceptions restored to baseline  Outcome: Progressing Towards Goal  Goal:  *Sensory perception restored to baseline  Outcome: Progressing Towards Goal  Goal: *Emotional stability restored to baseline  Outcome: Progressing Towards Goal  Goal: *Functional assessment restored to baseline  Outcome: Progressing Towards Goal  Goal: *Absence of falls  Outcome: Progressing Towards Goal  Goal: *Will remain free of delirium, CAM Score negative  Outcome: Progressing Towards Goal  Goal: *Cognitive status will be restored to baseline  Outcome: Progressing Towards Goal  Goal: Interventions  Outcome: Progressing Towards Goal

## 2019-08-10 NOTE — Progress Notes (Signed)
Progress  Notes by Scarlett Presto, MD at 08/10/19 1151                Author: Scarlett Presto, MD  Service: Internal Medicine  Author Type: Physician       Filed: 08/10/19 1152  Date of Service: 08/10/19 1151  Status: Signed          Editor: Scarlett Presto, MD (Physician)                         [facility] Progress Note          Patient: Brandy Vargas  MRN: [id number]   SSN: [id number]          Date of Birth: 1940-12-13   Age: 79 y.o.   Sex: female         Admit Date: 08/03/2019     LOS: 7 days         Subjective:        Chief Complaint: Confusion   Reason for Admission: Acute hyponatremia      79 year old CF with a PMH of PAF, HTN, GERD, and h/o breast CA who presented to the ER with 3-4 days of nausea and vomiting and 24 hours of waxing and waning confusion  and hallucinating. She had a left THA on 07/30/19 and did well with the surgery. Per the son, the patient was nauseated and had some vomiting post-op. She continued to have this, but was discharged on 08/01/19. The patient went home and continued to have  vomiting and was unable to hold anything down. Then on the evening of 08/02/19 she became confused and was seeing ants on her walls. Her son got her comfortable in the chair and she fell asleep. On the morning of 08/03/19 he found the patient in the floor  so he called EMS. She was admitted for sodium 117 and anemia. She was transfused 1U PRBC. She became acutely confused and has persisted.      8/4 - She continues to be confused this AM. Not agitated, but hallucinating and not sure what she's saying. Thinks she is at the "[facility]". Reaching for her non-existent keys on her table. Says she slept well (didn't sleep much last night  per nursing). Denies CP/SOB. Denies N/V/D.      August 5: Patient is AOx3 today.  Eating her breakfast.  Son was present at bedside and as per son patient has started around 180 degrees.  Patient denies any nausea, vomiting, diarrhea, chest pain or shortness of  breath.      August 6: Patient is AOx3.  She ate her dinner and breakfast yesterday.  Patient son at bedside.  Denies any nausea, vomiting, chest pain or shortness of breath.  No bowel movement in last 1 week but no abdominal discomfort.      August 7: Patient is AOx3.  Denies any complaint.  Feeling much better.  Last bowel movement documented yesterday.  Denies any nausea, vomiting, chest pain or palpitations.      August 8: AOx3.  No complaints.  As per patient if she does not get rehab bed then she has a bed at home.  Denies any nausea, vomiting, chest pain or palpitations.      August 9: AOx3.  Waiting for rehab bed.  Denies any nausea, chest pain, palpitation or vomiting.      Review of systems negative except stated above.  Assessment:        Primary Diagnosis: Acute hyponatremia         Hospital Problems  as of 08/10/2019  Date Reviewed:  07/29/19                     Codes  Class  Noted - Resolved  POA              * (Principal) Acute hyponatremia  ICD-10-CM: E87.1   ICD-9-CM: 276.1    08/03/2019 - Present  Yes                        ABLA (acute blood loss anemia)  ICD-10-CM: D62   ICD-9-CM: 285.1    08/03/2019 - Present  Yes                        History of total hip arthroplasty, left  ICD-10-CM: K16.010   ICD-9-CM: V43.64    08/03/2019 - Present  Yes                        Leukocytosis  ICD-10-CM: D72.829   ICD-9-CM: 288.60    08/03/2019 - Present  Yes                        Pure hyperglyceridemia  ICD-10-CM: E78.1   ICD-9-CM: 272.1    04/28/2019 - Present  Yes                        Paroxysmal atrial fibrillation (HCC)  ICD-10-CM: I48.0   ICD-9-CM: 427.31    04/28/2019 - Present  Yes                        Essential hypertension  ICD-10-CM: I10   ICD-9-CM: 401.9    04/28/2019 - Present  Yes                        RESOLVED: Acute metabolic encephalopathy  ICD-10-CM: G93.41   ICD-9-CM: 348.31    08/03/2019 - 08/06/2019  Yes                        RESOLVED: Nausea and vomiting  ICD-10-CM: R11.2   ICD-9-CM: 787.01     08/03/2019 - 08/06/2019  Yes                            Active Medical Issues and Plan (MDM):        Principal Problem:     Acute hyponatremia (08/03/2019)   - Sodium 138 (07/14/19) --> 117 (08/03/19)   - Currently 117 --> 126 --> 131   - Likely due to vomiting and dehydration   - Stop normal saline   - UA with ketones, otherwise normal   - Urine sodium 17 + osm 236   - Serum osm 246   - TFTs unremarkable   - Triglycerides 80   - Trend sodium Q6H   - Strict I/O   ??   August 5: Sodium stable.  Patient is AOx3.  Discussed with the patient and patient's son to increase p.o. intake.  Will monitor.   Likely secondary to dehydration secondary to nausea vomiting.      Problem 6: Stable.  Will monitor.  August 8: Improved significantly.  Current sodium 134.  Likely secondary to dehydration.  Will monitor.   Encouraged to increase p.o. intake.      August 9: Will monitor.      Active Problems:     Atrial Fibrillation with RVR (08/04/2019)   - The patient flipped into Afib RVR - now back in NSR   - Off Cardizem gtt   - Continue Lopressor 50mg  BID   - Continue Eliquis 2.5 BID   - Give Lopressor IV 5mg  PRN if BP permits            August 6: Discussed with patient and patient's son.  Patient creatinine is less than 1.5 and weight is more than 60 kg.  Patient was taking Eliquis 5 mg twice daily at home.  Risk and benefit discussed and family wants patient to take Eliquis 5 mg twice  daily.  Does not have any stent in her body so we will stop aspirin.  Family in agreement.   Next  ABLA (acute blood loss anemia) (08/03/2019)   - Likely due to left THA   - Hgb 7.4 at discharge --> admit 6.6 --> 1U PRBC --> 7.9 --> 7.4   - Continue Eliquis due to flipping in/out of Afib   - Transfuse Hgb <7.0   ??   August 5: As patient received blood transfusion during hospitalization so we will check iron studies, B12 and folate level tomorrow morning.      August 6: Will monitor.        Acute metabolic encephalopathy (08/03/2019)   - Worse overnight   -  Pleasantly confused   - Continue Seroquel 100mg  QHS   - Likely due to hyponatremia + delirium   - UA unremarkable   - TFTs unremarkable   ??   August 5: Resolved.  Will change Seroquel to as needed.        Nausea and vomiting (08/03/2019)   - Resolved   - Unsure etiology --> has been happening since post-op per son   - No abdominal pain   - Start Zofran PRN   - Start Phenergan PRN   ??   August 5: Resolved.        Leukocytosis (08/03/2019)   - I suspect this is reactive   - Resolved   - Decrease normal saline   - UA unremarkable   August 5: Resolved.        Essential hypertension (04/28/2019)   - Stable   - Continue Lopressor   - Continue ARB   ??   August 6: given hypotension, will decrease the dose of ARB into half.        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     Paroxysmal atrial fibrillation (HCC) (04/28/2019)   - Currently NSR   - Continue Lopressor   - Continue low dose Eliquis      Otherwise all chronic medical issues appear stable with no changes.      Diet: ADULT ORAL NUTRITION SUPPLEMENT Breakfast; Clear Liquid   ADULT ORAL NUTRITION SUPPLEMENT Lunch; Standard High Calorie/High Protein   ADULT ORAL NUTRITION SUPPLEMENT Dinner; Frozen Supplement   ADULT DIET Regular   VTE ppx: Eliquis      Again, patient is AOx3.   As per patient she is thinking about home health versus rehab.  She wants to discuss it with  her son.  I have informed her about physical therapy recommendations.  Patient has already reached maximal optimization of her inpatient  stay.  Waiting for placement.  Patient's son will be here shortly person and she will asked the nurse to page me if she or her son has any questions.           Objective:        Visit Vitals      BP  (!) 111/44 (BP 1 Location: Left upper arm, BP Patient Position: Sitting)     Pulse  66     Temp  98.4 ??F (36.9 ??C)     Resp  16     Ht   (1.6 m)     Wt  84.7 kg (186 lb 12.8 oz)     SpO2  98%         BMI  33.09 kg/m??         Oxygen Therapy   O2 Sat (%): 98 % (08/10/19 1113)   Pulse via Oximetry: 72 beats per minute (08/05/19 1600)   O2 Device: None (Room air) (08/10/19 1113)   Skin Assessment: Clean, dry, & intact (08/04/19 1922)   Skin Protection for O2 Device: No (08/04/19 1922)   O2 Flow Rate (L/min): 0 l/min (08/05/19 0755)         Intake and Output:    No intake or output data in the 24 hours ending 08/10/19 1151         Physical Exam:    GENERAL: AOx3, no distress, appears stated age   EYE: conjunctivae/corneas clear. PERRL.   THROAT & NECK: supple, no JVD.   LUNG: clear to auscultation bilaterally   HEART: tachy, irregular rate and rhythm, S1S2, no murmur, no JVD   ABDOMEN: soft, non-tender, non-distended. Bowel sounds normal.    EXTREMITIES:  No edema, capillary refill.   SKIN: no rash or abnormalities   NEUROLOGIC: AOx3, moving all 4 extremities, speech normal.      Lab/Data Review:   Procedures done this admission:   * No surgery found *        All Micro Results           None                  SARS-CoV-2 Lab Results     "Novel Coronavirus" Test: No results found for: COV2NT     "Emergent Disease" Test: No results found for: EDPR   "SARS-COV-2" Test: No results found for: XGCOVT   "Precision Labs" Test: No results found for: RSLT   Rapid Test: No results found for: COVR                 Labs:  Results:                  BMP, Mg, Phos  Recent Labs         08/09/19   0404  08/08/19   0420  08/07/19   1305      NA  134*  133*  133*      K  4.1  4.7  4.3      CL  100  100  100      CO2  AGAP  6*  5*  5*      BUN  21  24*  21      CREA  0.58*  0.75  0.65      CA  8.0*  8.7  7.9*      GLU  112*  110*  101*                 CBC  Recent Labs         08/09/19   0404  08/08/19   0420      WBC  14.6*  12.4*      RBC  2.51*  2.57*      HGB  7.7*  7.7*      HCT  22.9*  23.7*      PLT  293  281                 LFT  No results for input(s): ALT, TBIL, AP, TP, ALB, GLOB, AGRAT in the last 72 hours.      No  lab exists for component: SGOT, GPT     Cardiac Testing  No results found for: BNPP, BNP, CPK, RCK1, RCK2, RCK3, RCK4, CKMB, CKNDX, CKND1, TROPT, TROIQ        Coagulation Tests  Lab Results      Component  Value  Date/Time        Prothrombin time  13.8  07/30/2019 08:40 AM        Prothrombin time  16.7 (H)  07/14/2019 11:55 AM        INR  1.0  07/30/2019 08:40 AM        INR  1.3  07/14/2019 11:55 AM        aPTT  39.2 (H)  07/30/2019 08:40 AM        aPTT  48.6 (H)  07/14/2019 11:55 AM              A1c  Lab Results      Component  Value  Date/Time        Hemoglobin A1c  5.5  07/14/2019 11:55 AM           Lipid Panel  Lab Results      Component  Value  Date/Time        Cholesterol, total  132  04/02/2019 04:07 PM        HDL Cholesterol  42  04/02/2019 04:07 PM        LDL, calculated  62  04/02/2019 04:07 PM        VLDL, calculated  28  04/02/2019 04:07 PM        Triglyceride  80  08/03/2019 02:03 PM              Thyroid Panel  Lab Results      Component  Value  Date/Time        TSH  1.100  08/03/2019 02:03 PM        TSH  1.010  04/02/2019 04:07 PM        T4, Free  1.0  08/03/2019 02:03 PM                   Most Recent UA  Lab Results      Component  Value  Date/Time        Color  YELLOW  08/03/2019 01:07 PM        Appearance  CLEAR  08/03/2019 01:07 PM        Specific gravity  1.008  08/03/2019 01:07 PM        pH (UA)  6.0  08/03/2019 01:07 PM  Protein  Negative  08/03/2019 01:07 PM        Glucose  Negative  08/03/2019 01:07 PM        Ketone  15 (A)  08/03/2019 01:07 PM        Bilirubin  Negative  08/03/2019 01:07 PM        Blood  Negative  08/03/2019 01:07 PM        Urobilinogen  0.2  08/03/2019 01:07 PM        Nitrites  Negative  08/03/2019 01:07 PM        Leukocyte Esterase  Negative  08/03/2019 01:07 PM                          Signed By:  Scarlett Presto, MD           August 10, 2019

## 2019-08-10 NOTE — Progress Notes (Signed)
CM spoke with patient at bedside, and with son, Lorin Picket, by phone. Bed offer accepted at [facility] and insurance precert has been initiated by facility. CM notified pt/son that bed is available tomorrow, if precert received. Son requests to speak with therapy re: patient's progress and current level of function to determine if STR or HH would be better option. PT aware and will place call to son. Pt/son aware of current no visitation policy at facility. COVID PCR ordered in anticipation of STR. Son will contact CM should they decide to return home with [facility] services. CM will follow.    Current discharge plan: STR at [facility], pending precert.

## 2019-08-10 NOTE — Progress Notes (Signed)
 Problem: Self Care Deficits Care Plan (Adult)  Goal: *Acute Goals and Plan of Care (Insert Text)  08/05/2019 1214 by Delores Grizzle B, OT  Outcome: Progressing Towards Goal  Note:   1. Patient will perform grooming with stand by assistance seated edge of bed. GOAL MET 08/06/2019 standing at sink 08/10/2019   2. Patient will perform Upper body dressing with stand by assistance seated edge of bed GOAL MET 08/10/2019  3. Patient will perform bathing with moderate assistance seated edge of bed or on shower chair.   4. Patient will perform toilet transfers with moderate assistance bed to [facility].GOAL MET 08/10/2019  5. Patient will participate in 30 + minutes of ADL/ therapeutic exercise/therapeutic activity with min rest breaks to increase activity tolerance for self care.  6. Patient will perform ADL functional mobility in room (bed to chair, bed to [facility]) with moderate assistance. GOAL MET 08/10/2019 up to bathroom for toileting with minimal assistance for clothing only.     Goals to be achieved in 7 days.     New goals 08/10/2019    2. Patient will perform Upper body dressing with supervision  3. Patient will perform lower body dressing with supervision adaptive equipment as needed.   4. Patient will perform upper and lower body bathing with minimal assistance seated on shower chair.   5. Patient will perform toilet transfers with supervision.   6. Patient will perform shower transfer with stand by assistance.   7. Patient will participate in 30 + minutes of ADL/ therapeutic exercise/therapeutic activity with min rest breaks to increase activity tolerance for self care.  8. Patient will perform ADL functional mobility in room with stand by assistance.     Goals to be achieved in 7 days.      OCCUPATIONAL THERAPY: Daily Note and AM 08/10/2019  INPATIENT: OT Visit Days: 5  Payor: AARP MEDICARE COMPLETE / Plan: [facility] AARP MEDICARE COMPLETE / Product Type: Managed Care Medicare /      NAME/AGE/GENDER: Brandy Vargas is a 79 y.o.  female   PRIMARY DIAGNOSIS:  Acute hyponatremia [E87.1]  ABLA (acute blood loss anemia) [D62]  Acute metabolic encephalopathy [G93.41] Acute hyponatremia Acute hyponatremia       ICD-10: Treatment Diagnosis:    Generalized Muscle Weakness (M62.81)  Other lack of cordination (R27.8)   Precautions/Allergies:     Omeprazole magnesium      ASSESSMENT:     [name] presents with with above diagnosis. Pt was discharged on Saturday after left THA. Pt was seen in ICU with PT present. Noted upon entry drainage from left hip dressing, RN notified and assisted pt to roll for dressing change. Pt assisted with upper body sponge off and pt washed her face and assisted with gown change. Pt up to edge of bed and worked on standing and taking side steps. Pt is noted to have significant generalized weakness, decreased activity tolerance and mobility. Pt also noted to have some confusion. Pt would benefit from OT while in the hospital to maximize safety and independence with self care and functional mobility. Pt would likely benefit from STR as she currently needs quick a bit of assistance.     08/06/2019  Pt was out of ICU and in a regular room. Pt seen with PT due to previous difficulty of mobility. Pt was oriented x 4 today and seems cognitively intact. Pt also moved much better leading the son to consider taking her home. OT will continue plan of care.  08/07/2019  Pt was seen in room with son present. Pt continue to make progress with mobility and self care.  Pt up in room with walker, to bathroom for grooming and self care seated at sink. Pt make to recliner and left with son present. Pt tolerated well will continue plan of care.     08/08/19 Pt was sitting in chair upon arrival. Pt completed functional mobility with rolling walker.  Pt completed toilet transfer with SBA. Pt completed toileting with supervision. Pt stood at sink to completed grooming. Pt is progressing towards goals. Continue POC.     08/10/2019 Pt seen in room  this morning but declined activity due to being tired. Pt was concerned about going to rehab as she would like to go home. Pt lives alone and would benefit from STR to maximize safety and independence with self care and functional mobility. Encouraged pt to take STR as an opportunity and not a negative thing. Pt agreed.  Returned to see pt in afternoon for toileting, donning and doffing pants, and standing at sink for grooming. Pt is making good progress. New goals set and continue OT.       This section established at most recent assessment   PROBLEM LIST (Impairments causing functional limitations):  Decreased Strength  Decreased ADL/Functional Activities  Decreased Transfer Abilities  Decreased Ambulation Ability/Technique  Decreased Balance  Increased Pain  Decreased Activity Tolerance  Increased Fatigue  Decreased Cognition   INTERVENTIONS PLANNED: (Benefits and precautions of occupational therapy have been discussed with the patient.)  Activities of daily living training  Adaptive equipment training  Balance training  Cognitive training  Therapeutic activity  Therapeutic exercise     TREATMENT PLAN: Frequency/Duration: Follow patient 3 times per week to address above goals.  Rehabilitation Potential For Stated Goals: Good     REHAB RECOMMENDATIONS (at time of discharge pending progress):    Placement:  It is my opinion, based on this patient's performance to date, that Ms. Canning may benefit from intensive therapy at a [facility] F[facility] after discharge due to the functional deficits listed above that are likely to improve with skilled rehabilitation and concerns that he/she may be unsafe to be unsupervised at home due to confusion and amount of assistance required .  Equipment:   None at this time              OCCUPATIONAL PROFILE AND HISTORY:   History of Present Injury/Illness (Reason for Referral):  See H&P  Past Medical History/Comorbidities:   [name]  has a past medical history of Arthritis,  Atrial fibrillation (HCC), Breast cancer (HCC) (2015), COVID-19 vaccine series completed (04/08/2019), GERD (gastroesophageal reflux disease), Hypertension, and Overactive bladder. She also has no past medical history of Malignant hyperthermia due to anesthesia or Pseudocholinesterase deficiency.  Ms. Rasco  has a past surgical history that includes hx appendectomy; hx tubal ligation (1976); pr breast surgery procedure unlisted (Right, 2015); hx knee arthroscopy; and hx breast lumpectomy (Right).  Social History/Living Environment:   Home Environment: Private residence  # Steps to Enter: 5  One/Two Story Residence: One story  Living Alone: Yes  Support Systems: Child(ren)  Patient Expects to be Discharged to:: Unknown  Current DME Used/Available at Home: Environmental consultant, rollator, Medical laboratory scientific officer, straight  Prior Level of Function/Work/Activity:  Independent with all activities of daily living to include driving.      Number of Personal Factors/Comorbidities that affect the Plan of Care: Brief history (0):  LOW COMPLEXITY   ASSESSMENT OF  OCCUPATIONAL PERFORMANCE::   Activities of Daily Living:   Basic ADLs (From Assessment) Complex ADLs (From Assessment)   Feeding: Setup  Oral Facial Hygiene/Grooming: Minimum assistance  Bathing: Maximum assistance  Upper Body Dressing: Minimum assistance  Lower Body Dressing: Total assistance  Toileting: Total assistance     Grooming/Bathing/Dressing Activities of Daily Living                             Bed/Mat Mobility  Sit to Stand: Stand-by assistance  Stand to Sit: Stand-by assistance     Most Recent Physical Functioning:   Gross Assessment:                  Posture:     Balance:  Sitting: Intact  Standing: With support Bed Mobility:     Wheelchair Mobility:     Transfers:  Sit to Stand: Stand-by assistance  Stand to Sit: Stand-by assistance  Duration: 30 Minutes            Patient Vitals for the past 6 hrs:   BP BP Patient Position SpO2 Pulse   08/10/19 1113 (!) 111/44 Sitting 98 % 66        Mental Status  Neurologic State: Alert  Orientation Level: Oriented X4  Cognition: Follows commands  Perception: Appears intact  Perseveration: No perseveration noted  Safety/Judgement: Fall prevention                          Physical Skills Involved:  Range of Motion  Balance  Activity Tolerance  Pain (acute) Cognitive Skills Affected (resulting in the inability to perform in a timely and safe manner):  Executive Function  Short Term Recall  Sustained Attention Psychosocial Skills Affected:  Environmental Adaptation   Number of elements that affect the Plan of Care: 5+:  HIGH COMPLEXITY   CLINICAL DECISION MAKING:   [employer] AM-PACT "6 Clicks"   Daily Activity Inpatient Short Form  How much help from another person does the patient currently need... Total A Lot A Little None   1.  Putting on and taking off regular lower body clothing?   [x]  1   []  2   []  3   []  4   2.  Bathing (including washing, rinsing, drying)?   []  1   [x]  2   []  3   []  4   3.  Toileting, which includes using toilet, bedpan or urinal?   [x]  1   []  2   []  3   []  4   4.  Putting on and taking off regular upper body clothing?   []  1   [x]  2   []  3   []  4   5.  Taking care of personal grooming such as brushing teeth?   []  1   []  2   [x]  3   []  4   6.  Eating meals?   []  1   []  2   []  3   [x]  4    2007, Trustees of [address], under license to [place], [place]. All rights reserved      Score:  Initial: 13 Most Recent: X (Date: -- )    Interpretation of Tool:  Represents activities that are increasingly more difficult (i.e. Bed mobility, Transfers, Gait).     Use of outcome tool(s) and clinical judgement create a POC that gives a: LOW COMPLEXITY  TREATMENT:   (In addition to Assessment/Re-Assessment sessions the following treatments were rendered)     Pre-treatment Symptoms/Complaints:    Pain: Initial:     3 Post Session:  3     Self Care: (25 min): Procedure(s) (per grid) utilized to improve and/or restore self-care/home  management as related to dressing, toileting, grooming and functional transfers. Required minimal verbal, manual and   cueing to facilitate activities of daily living skills and compensatory activities.        Braces/Orthotics/Lines/Etc:   O2 Device: None (Room air)  Treatment/Session Assessment:    Response to Treatment:  pt tolerated fair.   Interdisciplinary Collaboration:   Physical Therapist  Certified Occupational Therapy Assistant  Registered Nurse  After treatment position/precautions:   Up in chair  Bed/Chair-wheels locked  Call light within reach  RN notified   Compliance with Program/Exercises: Compliant all of the time, Will assess as treatment progresses.  Recommendations/Intent for next treatment session:  Next visit will focus on advancements to more challenging activities and reduction in assistance provided.  Total Treatment Duration:  OT Patient Time In/Time Out  Time In: 1245  Time Out: 1310     Reva KATHEE Daring, OT

## 2019-08-11 LAB — SARS-COV-2: SARS-CoV-2: NOT DETECTED

## 2019-08-11 LAB — SARS-COV-2, PCR: SARS-CoV-2 by PCR: NOT DETECTED

## 2019-08-11 MED ORDER — FERROUS SULFATE 325 MG (65 MG ELEMENTAL IRON) TAB
325 mg (65 mg iron) | Freq: Two times a day (BID) | ORAL | Status: DC
Start: 2019-08-11 — End: 2019-08-12
  Administered 2019-08-11 – 2019-08-12 (×2): via ORAL

## 2019-08-11 MED FILL — FAMOTIDINE 20 MG TAB: 20 mg | ORAL | Qty: 1

## 2019-08-11 MED FILL — ELIQUIS 5 MG TABLET: 5 mg | ORAL | Qty: 1

## 2019-08-11 MED FILL — SUCRALFATE 1 GRAM TAB: 1 gram | ORAL | Qty: 1

## 2019-08-11 MED FILL — METOPROLOL TARTRATE 50 MG TAB: 50 mg | ORAL | Qty: 1

## 2019-08-11 MED FILL — STOOL SOFTENER-STIMULANT LAXATIVE 8.6 MG-50 MG TABLET: ORAL | Qty: 2

## 2019-08-11 MED FILL — ATORVASTATIN 10 MG TAB: 10 mg | ORAL | Qty: 1

## 2019-08-11 MED FILL — FERROUS SULFATE 325 MG (65 MG ELEMENTAL IRON) TAB: 325 mg (65 mg iron) | ORAL | Qty: 1

## 2019-08-11 MED FILL — TROSPIUM 20 MG TAB: 20 mg | ORAL | Qty: 1

## 2019-08-11 MED FILL — VALSARTAN 160 MG TAB: 160 mg | ORAL | Qty: 1

## 2019-08-11 NOTE — Progress Notes (Signed)
CM spoke with [facility] liaison; insurance precert remains pending. Patient's son visited facility this AM and met with liaison. CM will follow for insurance approval and will assist with discharge.    Update 1605: CM received call from son for update on precert status. CM spoke with Edwin Cap at [facility]; precert remains pending. CM placed call to son [phone]; left VM notifying him of above. Patient also updated at bedside. CM will follow-up for update tomorrow.

## 2019-08-11 NOTE — Progress Notes (Signed)
 Comprehensive Nutrition Assessment    Type and Reason for Visit: Reassess  Best Practice Alert for Malnutrition Screening Tool: Recently Lost Weight Without Trying: No, If Yes, How Much Weight Loss: Unsure, Eating Poorly Due to Decreased Appetite: Yes    Nutrition Recommendations/Plan:   . Discontinue Ensure clear, Ensure Enlive and magic.     Malnutrition Assessment:  Malnutrition Status: At risk for malnutrition (specify) (N/V since 7/29)    Nutrition Assessment:   Nutrition History: 8/3: Pt confused and unable to provide reliable weight or diet hx. PO intake likely limited since 7/29 d/t N/V post-op.      Nutrition Background: H/O: PAF, HTN, GERD, breast CA , left THA 07/30/2019-had PONV, discharged 7/31. Presented with  3-4 days of nausea and vomiting and 24 hours of waxing and waning confusion and hallucinating., pt found in the floor at home. Admitted with hyponatremia, N/V, ABLA, and acute metabolic encephalopathy  Daily Update:  Pt speaking sensically today, does not appeared confused. Pt with lunch tray at bedside with 100% consumed except Ensure Enlive, which she says she will drink it before dinner arrives. She says she likes and drinks the Ensure clear and Ensure Elive but the magic cup is too sweet. She is now ordering her meals and eating 75-10% of all meals.  She has not required any more phenergan for nausea.   Nutrition Related Findings:   NFPE-no fat or muscle wasting Wound Type: Surgical incision (TKA 7/29)    Current Nutrition Therapies:  ADULT ORAL NUTRITION SUPPLEMENT Breakfast; Clear Liquid  ADULT ORAL NUTRITION SUPPLEMENT Lunch; Standard High Calorie/High Protein  ADULT ORAL NUTRITION SUPPLEMENT Dinner; Frozen Supplement  ADULT DIET Regular    Current Intake:   Average Meal Intake: 51-75% (1 recorded intake for past 6 days) Average Supplement Intake: 51-75% (Pt reported)      Anthropometric Measures:  Height: 5' 3 (160 cm)  Current Body Wt: 84.7 kg (186 lb 11.7 oz) (8/9), Weight source: Bed  scale  BMI: 33.1, Obese class 1 (BMI 30.0-34.9)  Admission Body Weight: 169 lb 15.6 oz (source not specified)  Ideal Body Weight (lbs) (Calculated): 115 lbs (52 kg),    Usual Body Wt: 77.6 kg (171 lb) (per EMR within past 7 months),           Edema: No data recorded   Estimated Daily Nutrient Needs:  Energy (kcal/day): 1243-1555 ( (12-15), Weight Used: Usual (77.7 kg))  Protein (g/day): 62-78 Weight Used: ( (20% of kcal))  Fluid (ml/day):   (1 ml/kcal)    Nutrition Diagnosis:   No nutrition diagnosis at this time   Nutrition Interventions:   Food and/or Nutrient Delivery: Continue current diet, Discontinue oral nutrition supplement   Reviewed with pt that ONS no longer needed since meal intake is 75-100% and she is no longer confused and able to order her meals.  Coordination of Nutrition Care: Continue to monitor while inpatient      Goals:   Previous Goal Met: Goal(s) achieved  Active Goal: Continued intake >75% of estimated needs    Nutrition Monitoring and Evaluation:      Food/Nutrient Intake Outcomes: Food and nutrient intake       Discharge Planning:    Continue current diet    Greig Hummer, RD, LD, CNSC [phone]

## 2019-08-11 NOTE — Progress Notes (Signed)
Progress  Notes by Scarlett Presto, MD at 08/11/19 1121                Author: Scarlett Presto, MD  Service: Internal Medicine  Author Type: Physician       Filed: 08/11/19 1124  Date of Service: 08/11/19 1121  Status: Signed          Editor: Scarlett Presto, MD (Physician)                         [employer] Hospitalist Progress Note          Patient: Brandy Vargas  MRN: [id number]   SSN: [id number]          Date of Birth: [birth year]   Age: 79 y.o.   Sex: female         Admit Date: 08/03/2019     LOS: 8 days         Subjective:        Chief Complaint: Confusion   Reason for Admission: Acute hyponatremia      79 year old CF with a PMH of PAF, HTN, GERD, and h/o breast CA who presented to the ER with 3-4 days of nausea and vomiting and 24 hours of waxing and waning confusion  and hallucinating. She had a left THA on 07/30/19 and did well with the surgery. Per the son, the patient was nauseated and had some vomiting post-op. She continued to have this, but was discharged on 08/01/19. The patient went home and continued to have  vomiting and was unable to hold anything down. Then on the evening of 08/02/19 she became confused and was seeing ants on her walls. Her son got her comfortable in the chair and she fell asleep. On the morning of 08/03/19 he found the patient in the floor  so he called EMS. She was admitted for sodium 117 and anemia. She was transfused 1U PRBC. She became acutely confused and has persisted.  Patient hyponatremia resolved with IV fluid and patient is able to maintain her sodium in mid 130s with p.o. intake.   Her acute metabolic encephalopathy has completely resolved.  patient was restarted back on Eliquis and hemoglobin is stable.  Patient has reached optimization of her inpatient stay and waiting for rehab bed.      August 10: AOx3.  Waiting for rehab bed.  Denies any nausea, chest pain, palpitation or vomiting.      Review of systems negative except stated above.        Assessment:        Primary  Diagnosis: Acute hyponatremia         Hospital Problems  as of 08/11/2019  Date Reviewed:  2019-07-28                     Codes  Class  Noted - Resolved  POA              * (Principal) Acute hyponatremia  ICD-10-CM: E87.1   ICD-9-CM: 276.1    08/03/2019 - Present  Yes                        ABLA (acute blood loss anemia)  ICD-10-CM: D62   ICD-9-CM: 285.1    08/03/2019 - Present  Yes                        History of  total hip arthroplasty, left  ICD-10-CM: Z85.885   ICD-9-CM: V43.64    08/03/2019 - Present  Yes                        Leukocytosis  ICD-10-CM: D72.829   ICD-9-CM: 288.60    08/03/2019 - Present  Yes                        Pure hyperglyceridemia  ICD-10-CM: E78.1   ICD-9-CM: 272.1    04/28/2019 - Present  Yes                        Paroxysmal atrial fibrillation (HCC)  ICD-10-CM: I48.0   ICD-9-CM: 427.31    04/28/2019 - Present  Yes                        Essential hypertension  ICD-10-CM: I10   ICD-9-CM: 401.9    04/28/2019 - Present  Yes                        RESOLVED: Acute metabolic encephalopathy  ICD-10-CM: G93.41   ICD-9-CM: 348.31    08/03/2019 - 08/06/2019  Yes                        RESOLVED: Nausea and vomiting  ICD-10-CM: R11.2   ICD-9-CM: 787.01    08/03/2019 - 08/06/2019  Yes                            Active Medical Issues and Plan (MDM):        Principal Problem:     Acute hyponatremia (08/03/2019)   Secondary to dehydration.  Stable around 134.  Labs as needed.  Waiting for rehab.      Active Problems:     Atrial Fibrillation with RVR (08/04/2019)   On Eliquis.  Rate controlled on metoprolol        Acute metabolic encephalopathy (08/03/2019)   Resolved        Nausea and vomiting (08/03/2019)   Resolved        Leukocytosis (08/03/2019)   Resolved        Essential hypertension (04/28/2019)   Stable on current regimen.  Blood pressure medications adjusted.        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      Otherwise all chronic medical issues  appear stable with no changes.      Diet: ADULT ORAL NUTRITION SUPPLEMENT Breakfast; Clear Liquid   ADULT ORAL NUTRITION SUPPLEMENT Lunch; Standard High Calorie/High Protein   ADULT ORAL NUTRITION SUPPLEMENT Dinner; Frozen Supplement   ADULT DIET Regular   VTE ppx: Eliquis      Again, patient is AOx3.   As per patient she is thinking about home health versus rehab.  She wants to discuss it with her son.  I have informed her about physical therapy recommendations.  Patient has already reached maximal optimization of her inpatient  stay.  Waiting for placement.  Patient's son will be here shortly person and she will asked the nurse to page me if she or her son has any questions.           Objective:  Visit Vitals      BP  (!) 111/43 (BP 1 Location: Left upper arm, BP Patient Position: Sitting)     Pulse  83     Temp  99.1 ??F (37.3 ??C)     Resp  16     Ht  5\' 3"  (1.6 m)     Wt  84.7 kg (186 lb 12.8 oz)     SpO2  96%        BMI  33.09 kg/m??         Oxygen Therapy   O2 Sat (%): 96 % (08/11/19 1112)   Pulse via Oximetry: 72 beats per minute (08/05/19 1600)   O2 Device: None (Room air) (08/11/19 1112)   Skin Assessment: Clean, dry, & intact (08/04/19 1922)   Skin Protection for O2 Device: No (08/04/19 1922)   O2 Flow Rate (L/min): 0 l/min (08/05/19 0755)         Intake and Output:    No intake or output data in the 24 hours ending 08/11/19 1121         Physical Exam:    GENERAL: AOx3, no distress, appears stated age   EYE: conjunctivae/corneas clear. PERRL.   THROAT & NECK: supple, no JVD.   LUNG: clear to auscultation bilaterally   HEART: tachy, irregular rate and rhythm, S1S2, no murmur, no JVD   ABDOMEN: soft, non-tender, non-distended. Bowel sounds normal.    EXTREMITIES:  No edema, capillary refill.   SKIN: no rash or abnormalities   NEUROLOGIC: AOx3, moving all 4 extremities, speech normal.      Lab/Data Review:   Procedures done this admission:   * No surgery found *        All Micro Results                Procedure  Component  Value  Units  Date/Time           SARS-COV-2, PCR 10/11/19  Collected: 08/10/19 1149            Order Status: Completed  Specimen: Nasopharyngeal  Updated: 08/10/19 2038                Specimen source  Nasopharyngeal              SARS-CoV-2  Not detected                  Comment:        The specimen is NEGATIVE for SARS-CoV-2, the novel coronavirus associated with COVID-19.         This test has been authorized by the FDA under an Emergency Use Authorization (EUA) for use by authorized laboratories.          Fact sheet for Healthcare Providers: 2039         Fact sheet for Patients: FlickSafe.gl         Methodology: RT-PCR                             SARS-CoV-2 Lab Results     "Novel Coronavirus" Test: No results found for: COV2NT     "Emergent Disease" Test: No results found for: EDPR   "SARS-COV-2" Test: No results found for: XGCOVT   "Precision Labs" Test: No results found for: RSLT   Rapid Test: No results found for: COVR                 Labs:  Results:                  BMP, Mg, Phos  Recent Labs         08/09/19   0404      NA  134*      K  4.1      CL  100      CO2  28      AGAP  6*      BUN  21      CREA  0.58*      CA  8.0*      GLU  112*                 CBC  Recent Labs         08/09/19   0404      WBC  14.6*      RBC  2.51*      HGB  7.7*      HCT  22.9*      PLT  293                 LFT  No results for input(s): ALT, TBIL, AP, TP, ALB, GLOB, AGRAT in the last 72 hours.      No lab exists for component: SGOT, GPT     Cardiac Testing  No results found for: BNPP, BNP, CPK, RCK1, RCK2, RCK3, RCK4, CKMB, CKNDX, CKND1, TROPT, TROIQ        Coagulation Tests  Lab Results      Component  Value  Date/Time        Prothrombin time  13.8  07/30/2019 08:40 AM        Prothrombin time  16.7 (H)  07/14/2019 11:55 AM        INR  1.0  07/30/2019 08:40 AM        INR  1.3  07/14/2019 11:55 AM        aPTT  39.2 (H)  07/30/2019 08:40 AM         aPTT  48.6 (H)  07/14/2019 11:55 AM              A1c  Lab Results      Component  Value  Date/Time        Hemoglobin A1c  5.5  07/14/2019 11:55 AM           Lipid Panel  Lab Results      Component  Value  Date/Time        Cholesterol, total  132  04/02/2019 04:07 PM        HDL Cholesterol  42  04/02/2019 04:07 PM        LDL, calculated  62  04/02/2019 04:07 PM        VLDL, calculated  28  04/02/2019 04:07 PM        Triglyceride  80  08/03/2019 02:03 PM           Thyroid Panel  Lab Results      Component  Value  Date/Time        TSH  1.100  08/03/2019 02:03 PM        TSH  1.010  04/02/2019 04:07 PM        T4, Free  1.0  08/03/2019 02:03 PM                Most Recent UA  Lab Results      Component  Value  Date/Time        Color  YELLOW  08/03/2019 01:07 PM        Appearance  CLEAR  08/03/2019 01:07 PM        Specific gravity  1.008  08/03/2019 01:07 PM        pH (UA)  6.0  08/03/2019 01:07 PM        Protein  Negative  08/03/2019 01:07 PM        Glucose  Negative  08/03/2019 01:07 PM        Ketone  15 (A)  08/03/2019 01:07 PM        Bilirubin  Negative  08/03/2019 01:07 PM        Blood  Negative  08/03/2019 01:07 PM        Urobilinogen  0.2  08/03/2019 01:07 PM        Nitrites  Negative  08/03/2019 01:07 PM        Leukocyte Esterase  Negative  08/03/2019 01:07 PM                          Signed By:  Scarlett PrestoVipin Elisabeth Strom, MD           August 11, 2019

## 2019-08-11 NOTE — Progress Notes (Signed)
Problem: Pressure Injury - Risk of  Goal: *Prevention of pressure injury  Description: Document Braden Scale and appropriate interventions in the flowsheet.  Outcome: Progressing Towards Goal  Note: Pressure Injury Interventions:  Sensory Interventions: Assess changes in LOC    Moisture Interventions: Absorbent underpads    Activity Interventions: Increase time out of bed    Mobility Interventions: Pressure redistribution bed/mattress (bed type)    Nutrition Interventions: Offer support with meals,snacks and hydration    Friction and Shear Interventions: Apply protective barrier, creams and emollients, Foam dressings/transparent film/skin sealants, HOB 30 degrees or less, Minimize layers                Problem: Falls - Risk of  Goal: *Absence of Falls  Description: Document Schmid Fall Risk and appropriate interventions in the flowsheet.  Outcome: Progressing Towards Goal  Note: Fall Risk Interventions:  Mobility Interventions: Bed/chair exit alarm    Mentation Interventions: Bed/chair exit alarm    Medication Interventions: Bed/chair exit alarm    Elimination Interventions: Call light in reach    History of Falls Interventions: Bed/chair exit alarm         Problem: Delirium Treatment  Goal: *Level of consciousness restored to baseline  Outcome: Progressing Towards Goal  Goal: *Level of environmental perceptions restored to baseline  Outcome: Progressing Towards Goal  Goal: *Sensory perception restored to baseline  Outcome: Progressing Towards Goal  Goal: *Emotional stability restored to baseline  Outcome: Progressing Towards Goal  Goal: *Functional assessment restored to baseline  Outcome: Progressing Towards Goal  Goal: *Absence of falls  Outcome: Progressing Towards Goal  Goal: *Will remain free of delirium, CAM Score negative  Outcome: Progressing Towards Goal  Goal: *Cognitive status will be restored to baseline  Outcome: Progressing Towards Goal  Goal: Interventions  Outcome: Progressing Towards Goal

## 2019-08-11 NOTE — Progress Notes (Signed)
Problem: Pressure Injury - Risk of  Goal: *Prevention of pressure injury  Description: Document Braden Scale and appropriate interventions in the flowsheet.  Outcome: Progressing Towards Goal  Note: Pressure Injury Interventions:  Sensory Interventions: Assess changes in LOC    Moisture Interventions: Apply protective barrier, creams and emollients, Absorbent underpads, Check for incontinence Q2 hours and as needed, Limit adult briefs    Activity Interventions: Pressure redistribution bed/mattress(bed type), Increase time out of bed, PT/OT evaluation    Mobility Interventions: PT/OT evaluation, Pressure redistribution bed/mattress (bed type)    Nutrition Interventions: Offer support with meals,snacks and hydration    Friction and Shear Interventions: Apply protective barrier, creams and emollients, Foam dressings/transparent film/skin sealants, HOB 30 degrees or less, Minimize layers                Problem: Falls - Risk of  Goal: *Absence of Falls  Description: Document Schmid Fall Risk and appropriate interventions in the flowsheet.  Outcome: Progressing Towards Goal  Note: Fall Risk Interventions:  Mobility Interventions: OT consult for ADLs, Patient to call before getting OOB, PT Consult for mobility concerns, PT Consult for assist device competence, Strengthening exercises (ROM-active/passive), Utilize walker, cane, or other assistive device    Mentation Interventions: Bed/chair exit alarm    Medication Interventions: Assess postural VS orthostatic hypotension, Patient to call before getting OOB, Teach patient to arise slowly    Elimination Interventions: Call light in reach, Patient to call for help with toileting needs, Toileting schedule/hourly rounds    History of Falls Interventions: Door open when patient unattended, Investigate reason for fall, Room close to nurse's station         Problem: Delirium Treatment  Goal: *Level of consciousness restored to baseline  Outcome: Resolved/Met  Goal: *Level of  environmental perceptions restored to baseline  Outcome: Resolved/Met  Goal: *Sensory perception restored to baseline  Outcome: Progressing Towards Goal  Goal: *Emotional stability restored to baseline  Outcome: Progressing Towards Goal  Goal: *Functional assessment restored to baseline  Outcome: Progressing Towards Goal  Goal: *Absence of falls  Outcome: Progressing Towards Goal  Goal: *Will remain free of delirium, CAM Score negative  Outcome: Progressing Towards Goal  Goal: *Cognitive status will be restored to baseline  Outcome: Resolved/Met  Goal: Interventions  Outcome: Progressing Towards Goal

## 2019-08-12 LAB — BASIC METABOLIC PANEL
Anion Gap: 5 mmol/L — ABNORMAL LOW (ref 7–16)
BUN: 21 MG/DL (ref 8–23)
CO2: 29 mmol/L (ref 21–32)
Calcium: 7.9 MG/DL — ABNORMAL LOW (ref 8.3–10.4)
Chloride: 103 mmol/L (ref 98–107)
Creatinine: 0.77 MG/DL (ref 0.6–1.0)
EGFR IF NonAfrican American: >60 mL/min/{1.73_m2} (ref >60)
GFR African American: >60 mL/min/{1.73_m2} (ref >60)
Glucose: 123 mg/dL — ABNORMAL HIGH (ref 65–100)
Potassium: 4 mmol/L (ref 3.5–5.1)
Sodium: 137 mmol/L (ref 136–145)

## 2019-08-12 LAB — CBC WITH AUTO DIFFERENTIAL
Basophils %: 0 % (ref 0.0–2.0)
Basophils Absolute: 0.1 10*3/uL (ref 0.0–0.2)
Eosinophils %: 2 % (ref 0.5–7.8)
Eosinophils Absolute: 0.3 10*3/uL (ref 0.0–0.8)
Granulocyte Absolute Count: 0.2 10*3/uL (ref 0.0–0.5)
Hematocrit: 24 % — ABNORMAL LOW (ref 35.8–46.3)
Hemoglobin: 7.7 g/dL — ABNORMAL LOW (ref 11.7–15.4)
Immature Granulocytes: 2 % (ref 0.0–5.0)
Lymphocytes %: 16 % (ref 13–44)
Lymphocytes Absolute: 2 10*3/uL (ref 0.5–4.6)
MCH: 30.2 PG (ref 26.1–32.9)
MCHC: 32.1 g/dL (ref 31.4–35.0)
MCV: 94.1 FL (ref 79.6–97.8)
MPV: 9.1 FL — ABNORMAL LOW (ref 9.4–12.3)
Monocytes %: 7 % (ref 4.0–12.0)
Monocytes Absolute: 0.9 10*3/uL (ref 0.1–1.3)
NRBC Absolute: 0 10*3/uL (ref 0.0–0.2)
Neutrophils %: 72 % (ref 43–78)
Neutrophils Absolute: 8.7 10*3/uL — ABNORMAL HIGH (ref 1.7–8.2)
Platelets: 320 10*3/uL (ref 150–450)
RBC: 2.55 M/uL — ABNORMAL LOW (ref 4.05–5.2)
RDW: 14.9 % — ABNORMAL HIGH (ref 11.9–14.6)
WBC: 12 10*3/uL — ABNORMAL HIGH (ref 4.3–11.1)

## 2019-08-12 LAB — METABOLIC PANEL, BASIC
Anion gap: 5 mmol/L — ABNORMAL LOW (ref 7–16)
BUN: 21 MG/DL (ref 8–23)
CO2: 29 mmol/L (ref 21–32)
Calcium: 7.9 MG/DL — ABNORMAL LOW (ref 8.3–10.4)
Chloride: 103 mmol/L (ref 98–107)
Creatinine: 0.77 MG/DL (ref 0.6–1.0)
GFR est AA: >60 mL/min/{1.73_m2} (ref >60)
GFR est non-AA: >60 mL/min/{1.73_m2} (ref >60)
Glucose: 123 mg/dL — ABNORMAL HIGH (ref 65–100)
Potassium: 4 mmol/L (ref 3.5–5.1)
Sodium: 137 mmol/L (ref 136–145)

## 2019-08-12 LAB — CBC WITH AUTOMATED DIFF
ABS. BASOPHILS: 0.1 10*3/uL (ref 0.0–0.2)
ABS. EOSINOPHILS: 0.3 10*3/uL (ref 0.0–0.8)
ABS. IMM. GRANS.: 0.2 10*3/uL (ref 0.0–0.5)
ABS. LYMPHOCYTES: 2 10*3/uL (ref 0.5–4.6)
ABS. MONOCYTES: 0.9 10*3/uL (ref 0.1–1.3)
ABS. NEUTROPHILS: 8.7 10*3/uL — ABNORMAL HIGH (ref 1.7–8.2)
ABSOLUTE NRBC: 0 10*3/uL (ref 0.0–0.2)
BASOPHILS: 0 % (ref 0.0–2.0)
EOSINOPHILS: 2 % (ref 0.5–7.8)
HCT: 24 % — ABNORMAL LOW (ref 35.8–46.3)
HGB: 7.7 g/dL — ABNORMAL LOW (ref 11.7–15.4)
IMMATURE GRANULOCYTES: 2 % (ref 0.0–5.0)
LYMPHOCYTES: 16 % (ref 13–44)
MCH: 30.2 PG (ref 26.1–32.9)
MCHC: 32.1 g/dL (ref 31.4–35.0)
MCV: 94.1 FL (ref 79.6–97.8)
MONOCYTES: 7 % (ref 4.0–12.0)
MPV: 9.1 FL — ABNORMAL LOW (ref 9.4–12.3)
NEUTROPHILS: 72 % (ref 43–78)
PLATELET: 320 10*3/uL (ref 150–450)
RBC: 2.55 M/uL — ABNORMAL LOW (ref 4.05–5.2)
RDW: 14.9 % — ABNORMAL HIGH (ref 11.9–14.6)
WBC: 12 10*3/uL — ABNORMAL HIGH (ref 4.3–11.1)

## 2019-08-12 MED ORDER — ASPIRIN 81 MG CHEWABLE TAB
81 mg | ORAL_TABLET | Freq: Every day | ORAL | 2 refills | Status: DC
Start: 2019-08-12 — End: 2019-10-27

## 2019-08-12 MED ORDER — FERROUS SULFATE 325 MG (65 MG ELEMENTAL IRON) TAB
325 mg (65 mg iron) | ORAL_TABLET | Freq: Every day | ORAL | 0 refills | Status: AC
Start: 2019-08-12 — End: 2019-09-11

## 2019-08-12 MED FILL — ELIQUIS 5 MG TABLET: 5 mg | ORAL | Qty: 1

## 2019-08-12 MED FILL — TROSPIUM 20 MG TAB: 20 mg | ORAL | Qty: 1

## 2019-08-12 MED FILL — FAMOTIDINE 20 MG TAB: 20 mg | ORAL | Qty: 1

## 2019-08-12 MED FILL — ATORVASTATIN 10 MG TAB: 10 mg | ORAL | Qty: 1

## 2019-08-12 MED FILL — STOOL SOFTENER-STIMULANT LAXATIVE 8.6 MG-50 MG TABLET: ORAL | Qty: 2

## 2019-08-12 MED FILL — METOPROLOL TARTRATE 50 MG TAB: 50 mg | ORAL | Qty: 1

## 2019-08-12 MED FILL — SUCRALFATE 1 GRAM TAB: 1 gram | ORAL | Qty: 1

## 2019-08-12 MED FILL — VALSARTAN 160 MG TAB: 160 mg | ORAL | Qty: 1

## 2019-08-12 MED FILL — FERROUS SULFATE 325 MG (65 MG ELEMENTAL IRON) TAB: 325 mg (65 mg iron) | ORAL | Qty: 1

## 2019-08-12 NOTE — Progress Notes (Signed)
Patient to be discharged to [facility] for STR today. Patient will go to room 328B; report to [phone]. Son will transport patient to facility in his personal car; facility aware. CM will remain available should new needs arise.    Care Management Interventions  PCP Verified by CM: Yes  Last Visit to PCP: 07/10/19  Mode of Transport at Discharge: Self (Son)  Transition of Care Consult (CM Consult): SNF  Partner SNF: No  Reason Why Partner SNF Not Chosen: Location  Discharge Durable Medical Equipment: No  Physical Therapy Consult: Yes  Occupational Therapy Consult: Yes  Current Support Network: Own Home  Confirm Follow Up Transport: Family  The Plan for Transition of Care is Related to the Following Treatment Goals : Return to previous level of function.  The Patient and/or Patient Representative was Provided with a Choice of Provider and Agrees with the Discharge Plan?: Yes  Freedom of Choice List was Provided with Basic Dialogue that Supports the Patient's Individualized Plan of Care/Goals, Treatment Preferences and Shares the Quality Data Associated with the Providers?: Yes  Discharge Location  Discharge Placement: Skilled nursing facility ([facility])

## 2019-08-12 NOTE — Progress Notes (Signed)
Physician Progress Note      PATIENT:               Brandy Vargas, Brandy Vargas  CSN #:                  [id number]  DOB:                       [birth year]  ADMIT DATE:       08/03/2019 7:07 AM  DISCH DATE:        08/12/2019 1:17 PM  RESPONDING  PROVIDER #:        Franklyn Lor MD          QUERY TEXT:    Pt admitted with hyponatremia.  Patient was s/p left [facility] on July 29.    According to documentation, "per son, the patient was nauseated and had some vomiting post-op"  Post discharge from hip surgery, patient had "vomiting and was unable to hold anything down".  On evening of August 1, "she became confused and was seeing ants on her walls. Her son got her comfortable in the chair and she fell asleep. On the morning of 08/03/19 he found the patient in the floor so he called EMS"  Pt noted to have sodium level of 117 on admission.  After study, was the hyponatremia:    The medical record reflects the following:  Risk Factors: advanced age, recent surgery, pain medication  Clinical Indicators: Documentation of nausea and vomiting post op.  Na level on admission 117 with improvement to 137  Treatment:  IVF.  Options provided:  -- Hyponatremia  as a postoperative complication  -- Hyponatremia in the post op period due to vomiting secondary to anesthesia, pain medication  -- Other - I will add my own diagnosis  -- Disagree - Not applicable / Not valid  -- Disagree - Clinically unable to determine / Unknown  -- Refer to Clinical Documentation Reviewer    PROVIDER RESPONSE TEXT:    Patient has hyponatremia in the post op period due to vomiting secondary to anesthesia, pain medication.    Query created by: Verlon Au on 08/25/2019 2:20 PM      Electronically signed by:  Franklyn Lor MD 08/27/2019 7:15 AM

## 2019-08-12 NOTE — Progress Notes (Signed)
Problem: Pressure Injury - Risk of  Goal: *Prevention of pressure injury  Description: Document Braden Scale and appropriate interventions in the flowsheet.  Outcome: Progressing Towards Goal  Note: Pressure Injury Interventions:  Sensory Interventions: Assess changes in LOC    Moisture Interventions: Absorbent underpads, Apply protective barrier, creams and emollients    Activity Interventions: Increase time out of bed    Mobility Interventions: Pressure redistribution bed/mattress (bed type)    Nutrition Interventions: Offer support with meals,snacks and hydration    Friction and Shear Interventions: Apply protective barrier, creams and emollients, Foam dressings/transparent film/skin sealants, HOB 30 degrees or less, Minimize layers                Problem: Delirium Treatment  Goal: *Sensory perception restored to baseline  Outcome: Progressing Towards Goal  Goal: *Emotional stability restored to baseline  Outcome: Progressing Towards Goal  Goal: *Functional assessment restored to baseline  Outcome: Progressing Towards Goal  Goal: *Absence of falls  Outcome: Progressing Towards Goal  Goal: *Will remain free of delirium, CAM Score negative  Outcome: Progressing Towards Goal  Goal: Interventions  Outcome: Progressing Towards Goal

## 2019-08-12 NOTE — Discharge Summary (Signed)
Discharge Summary by Gloriann Loan, MD at 08/12/19 1122                Author: Gloriann Loan, MD  Service: Internal Medicine  Author Type: Physician       Filed: 08/12/19 1124  Date of Service: 08/12/19 1122  Status: Signed          Editor: Gloriann Loan, MD (Physician)                      Hospitalist Discharge Summary     Admit Date:  08/03/2019  7:07 AM    Name:  Brandy Vargas    Age:  79 y.o.   Sex:  female   DOB:  01/23/1940    MRN:  [id number]    PCP:  Delana Meyer, MD      Problem List for this Hospitalization:      Hospital Problems  as of 08/12/2019  Date Reviewed:  08/04/2019                     Codes  Class  Noted - Resolved  POA              History of total hip arthroplasty, left  ICD-10-CM: I09.735   ICD-9-CM: V43.64    08/03/2019 - Present  Yes                        Leukocytosis  ICD-10-CM: D72.829   ICD-9-CM: 288.60    08/03/2019 - Present  Yes                        Pure hyperglyceridemia  ICD-10-CM: E78.1   ICD-9-CM: 272.1    04/28/2019 - Present  Yes                        Paroxysmal atrial fibrillation (HCC)  ICD-10-CM: I48.0   ICD-9-CM: 427.31    04/28/2019 - Present  Yes                        Essential hypertension  ICD-10-CM: I10   ICD-9-CM: 401.9    04/28/2019 - Present  Yes                        * (Principal) RESOLVED: Acute hyponatremia  ICD-10-CM: E87.1   ICD-9-CM: 276.1    08/03/2019 - 08/12/2019  Yes                        RESOLVED: Acute metabolic encephalopathy  ICD-10-CM: G93.41   ICD-9-CM: 348.31    08/03/2019 - 08/06/2019  Yes                        RESOLVED: ABLA (acute blood loss anemia)  ICD-10-CM: D62   ICD-9-CM: 285.1    08/03/2019 - 08/12/2019  Yes                        RESOLVED: Nausea and vomiting  ICD-10-CM: R11.2   ICD-9-CM: 787.01    08/03/2019 - 08/06/2019  Yes                       Did Patient have Sepsis (YES OR NO): no  Hospital Course:   79 year old CF with a PMH of PAF, HTN, GERD, and h/o breast CA who presented to the ER with 3-4 days of nausea  and vomiting and 24 hours of waxing and waning confusion and hallucinating.  She had a left THA on 07/30/19 and did well with the surgery. Per the son, the patient was nauseated and had some vomiting post-op. She continued to have this, but was discharged on 08/01/19. The patient went home and continued to have vomiting and was  unable to hold anything down. Then on the evening of 08/02/19 she became confused and was seeing ants on her walls. Her son got her comfortable in the chair and she fell asleep. On the morning of 08/03/19 he found the patient in the floor so he called EMS.  She was admitted for sodium 117 and anemia. She was transfused 1U PRBC. She became acutely confused and has persisted.  Patient hyponatremia resolved with IV fluid and patient is able to maintain her sodium in mid 130s with p.o. intake.  Her acute metabolic  encephalopathy has completely resolved.  patient was restarted back on Eliquis and hemoglobin is stable.  Patient has reached optimization of her inpatient stay and waiting for rehab bed.   She is feeling better and wants to go to rehab.         Disposition: Skilled Nursing Facility   Diet: ADULT DIET Regular   Code Status: Full Code      Follow Up Orders:   No orders of the defined types were placed in this encounter.           Follow-up Information               Follow up With  Specialties  Details  Why  Contact Info              [facility])  Skilled Nursing Facility, [facility]  Go today    [address]   [place] [address]   [phone]              Delana Meyer, MD  Family Medicine  Go to  when discharged from rehab  [address]   [address]   [place] [address]   [phone]                   Time spent in patient discharge and coordination 33 minutes.      Plan was discussed with pt.  All questions answered.  Patient was stable at time of discharge.  Instructions given to call a physician or return  if any concerns.       Discharge Info:      Current Discharge Medication List              START taking these medications          Details        ferrous sulfate 325 mg (65 mg iron) tablet  Take 1 Tablet by mouth Daily (before breakfast) for 30 days.   Qty: 30 Tablet, Refills:  0   Start date: 08/12/2019, End date:  09/11/2019                     CONTINUE these medications which have CHANGED          Details        aspirin 81 mg chewable tablet  Take 1 Tablet by  mouth daily. MAY DECREASE ASPIRIN DOSAGE TO ONCE DAILY ONCE RESTART ON ELIQUIS.   Qty: 30 Tablet, Refills:  2   Start date: 08/12/2019                     CONTINUE these medications which have NOT CHANGED          Details        telmisartan (MICARDIS) 80 mg tablet  Take 1 tablet by mouth once daily   Qty: 90 Tablet, Refills:  0   Start date: 08/10/2019               ondansetron hcl (ZOFRAN) 8 mg tablet  Take 1 Tablet by mouth every eight (8) hours as needed for Nausea or Vomiting. Indications: prevent nausea and vomiting after surgery   Qty: 30 Tablet, Refills:  0          Associated Diagnoses: Post-operative nausea and vomiting               Eliquis 5 mg tablet  Take 1 Tablet by mouth two (2) times a day. MAY DECREASE ASPIRIN DOSAGE TO ONCE DAILY  AND RESTART ELIQUIS ON Tuesday, AUGUST 3RD, IF NO WOUND DRAINAGE.   Qty: 1 Tablet, Refills:  0               solifenacin (VESICARE) 10 mg tablet  Take 1 tablet by mouth once daily   Qty: 30 Tablet, Refills:  0               calcium carbonate (OS-CAL) 500 mg calcium (1,250 mg) tablet  Take 1 Tablet by mouth daily.               cholecalciferol (VITAMIN D3) (2,000 UNITS /50 MCG) cap capsule  Take 2,000 Units by mouth daily.               metoprolol tartrate (LOPRESSOR) 50 mg tablet  TAKE 1 TABLET BY MOUTH TWICE DAILY   Qty: 180 Tablet, Refills:  3               simvastatin (ZOCOR) 20 mg tablet  TAKE 1 TABLET BY MOUTH ONCE DAILY   Qty: 90 Tab, Refills:  3               dilTIAZem IR (CARDIZEM) 30 mg tablet  Take 1 tablet po prn q 4h for afib.  Heart rate >100                     STOP taking these medications                  oxyCODONE IR (ROXICODONE) 5 mg immediate release tablet  Comments:    Reason for Stopping:                             Procedures done this admission:   * No surgery found *      Consults this admission:   None      Echocardiogram/EKG results:   No results found for this visit on 08/03/19.        EKG Results           None                  Diagnostic Imaging/Tests:    XR PELV AP ONLY      Result Date: 07/30/2019   EXAMINATION: XR  PELV AP ONLY 07/30/2019 12:36 PM ACCESSION NUMBER: [id number] INDICATION: Post op total hip COMPARISON: Left hip x-rays 04/22/2019 TECHNIQUE: A single AP supine view of the pelvis was obtained. FINDINGS: Immediate postoperative changes status  post left hip arthroplasty, including subcutaneous emphysema and soft tissue swelling. Unremarkable immediate postoperative appearance of the hardware. 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 Pubic symphysis degenerative  change.       Unremarkable immediate postoperative appearance of a left hip total arthroplasty. VOICE DICTATED BY: Dr. Caryl Comes      XR ABD ACUTE W 1 V CHEST      Result Date: 08/03/2019   EXAM: Acute abdominal series INDICATION: abd pain. COMPARISON: None available TECHNIQUE: A single view of the chest and 2 views of the abdomen were obtained. FINDINGS: The bowel gas pattern is nonobstructive. No intraperitoneal free air is evident. Moderate  retained fecal burden in the right colon. The lungs are clear and the pulmonary vasculature is normal. No pneumothorax or pleural effusion. The cardiomediastinal silhouette is within normal limits. Left hip arthroplasty is unremarkable.       1.  No acute radiographic abnormality of the chest or abdomen. 2.  Moderate retained fecal burden in the right colon.      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  Standard views with 3-D tomosynthesis Spot CC and MLO views The focal asymmetry likely effaces on compression magnification CC, MLO, and ML views a total symphysis cc and MLO compression magnification views were then  obtained where there is normal superimposed fibroglandular tissue at the area of asymmetry. No concerning mass or calcifications.       Benign left mammogram Recommend screening mammography in one year. A reminder letter will be scheduled.  This was reported to the patient at the time of interpretation by the techologist. BI-RADS Assessment Category 2: Benign finding            All Micro Results               Procedure  Component  Value  Units  Date/Time           SARS-COV-2, PCR [[id number]]  Collected: 08/10/19 1149            Order Status: Completed  Specimen: Nasopharyngeal  Updated: 08/10/19 2038                Specimen source  Nasopharyngeal              SARS-CoV-2  Not detected                  Comment:        The specimen is NEGATIVE for SARS-CoV-2, the novel coronavirus associated with COVID-19.         This test has been authorized by the FDA under an Emergency Use Authorization (EUA) for use by authorized laboratories.          Fact sheet for Healthcare Providers: FlickSafe.gl         Fact sheet for Patients: [place].at         Methodology: RT-PCR  Labs:  Results:                  BMP, Mg, Phos  Recent Labs         08/12/19   0419      NA  137      K  4.0      CL  103      CO2  29      AGAP  5*      BUN  21      CREA  0.77      CA  7.9*      GLU  123*                 CBC  Recent Labs         08/12/19   0419      WBC  12.0*      RBC  2.55*      HGB  7.7*       HCT  24.0*      PLT  320      GRANS  72      LYMPH  16      EOS  2      MONOS  7      BASOS  0      IG  2      ANEU  8.7*      ABL  2.0      ABE  0.3      ABM  0.9      ABB  0.1      AIG  0.2                 LFT  No results for input(s): ALT, TBIL, AP, TP, ALB, GLOB, AGRAT in the last 72 hours.      No lab exists for component: SGOT, GPT     Cardiac Testing  No results found for: BNPP, BNP, CPK, RCK1, RCK2, RCK3, RCK4, CKMB, CKNDX, CKND1, TROPT, TROIQ        Coagulation Tests  Lab Results      Component  Value  Date/Time        Prothrombin time  13.8  07/30/2019 08:40 AM        Prothrombin time  16.7 (H)  07/14/2019 11:55 AM        INR  1.0  07/30/2019 08:40 AM        INR  1.3  07/14/2019 11:55 AM        aPTT  39.2 (H)  07/30/2019 08:40 AM        aPTT  48.6 (H)  07/14/2019 11:55 AM              A1c  Lab Results      Component  Value  Date/Time        Hemoglobin A1c  5.5  07/14/2019 11:55 AM           Lipid Panel  Lab Results      Component  Value  Date/Time        Cholesterol, total  132  04/02/2019 04:07 PM        HDL Cholesterol  42  04/02/2019 04:07 PM        LDL, calculated  62  04/02/2019 04:07 PM        VLDL, calculated  28  04/02/2019 04:07 PM        Triglyceride  80  08/03/2019 02:03 PM  Thyroid Panel  Lab Results      Component  Value  Date/Time        TSH  1.100  08/03/2019 02:03 PM        TSH  1.010  04/02/2019 04:07 PM        T4, Free  1.0  08/03/2019 02:03 PM                Most Recent UA  Lab Results      Component  Value  Date/Time        Color  YELLOW  08/03/2019 01:07 PM        Appearance  CLEAR  08/03/2019 01:07 PM        Specific gravity  1.008  08/03/2019 01:07 PM        pH (UA)  6.0  08/03/2019 01:07 PM        Protein  Negative  08/03/2019 01:07 PM        Glucose  Negative  08/03/2019 01:07 PM        Ketone  15 (A)  08/03/2019 01:07 PM        Bilirubin  Negative  08/03/2019 01:07 PM        Blood  Negative  08/03/2019 01:07 PM        Urobilinogen  0.2  08/03/2019 01:07 PM        Nitrites   Negative  08/03/2019 01:07 PM        Leukocyte Esterase  Negative  08/03/2019 01:07 PM                    All Labs from Last 24 Hrs:     Recent Results (from the past 24 hour(s))     METABOLIC PANEL, BASIC          Collection Time: 08/12/19  4:19 AM         Result  Value  Ref Range            Sodium  137  136 - 145 mmol/L       Potassium  4.0  3.5 - 5.1 mmol/L       Chloride  103  98 - 107 mmol/L       CO2  29  21 - 32 mmol/L       Anion gap  5 (L)  7 - 16 mmol/L       Glucose  123 (H)  65 - 100 mg/dL       BUN  21  8 - 23 MG/DL       Creatinine  6.96  0.6 - 1.0 MG/DL       GFR est AA  >29  >60 ml/min/1.54m2       GFR est non-AA  >60  >60 ml/min/1.83m2       Calcium  7.9 (L)  8.3 - 10.4 MG/DL       CBC WITH AUTOMATED DIFF          Collection Time: 08/12/19  4:19 AM         Result  Value  Ref Range            WBC  12.0 (H)  4.3 - 11.1 K/uL       RBC  2.55 (L)  4.05 - 5.2 M/uL       HGB  7.7 (L)  11.7 - 15.4 g/dL       HCT  52.8 (L)  41.3 - 46.3 %  MCV  94.1  79.6 - 97.8 FL       MCH  30.2  26.1 - 32.9 PG       MCHC  32.1  31.4 - 35.0 g/dL       RDW  16.1 (H)  09.6 - 14.6 %       PLATELET  320  150 - 450 K/uL       MPV  9.1 (L)  9.4 - 12.3 FL       ABSOLUTE NRBC  0.00  0.0 - 0.2 K/uL       DF  AUTOMATED          NEUTROPHILS  72  43 - 78 %       LYMPHOCYTES  16  13 - 44 %       MONOCYTES  7  4.0 - 12.0 %       EOSINOPHILS  2  0.5 - 7.8 %       BASOPHILS  0  0.0 - 2.0 %       IMMATURE GRANULOCYTES  2  0.0 - 5.0 %       ABS. NEUTROPHILS  8.7 (H)  1.7 - 8.2 K/UL       ABS. LYMPHOCYTES  2.0  0.5 - 4.6 K/UL       ABS. MONOCYTES  0.9  0.1 - 1.3 K/UL       ABS. EOSINOPHILS  0.3  0.0 - 0.8 K/UL       ABS. BASOPHILS  0.1  0.0 - 0.2 K/UL            ABS. IMM. GRANS.  0.2  0.0 - 0.5 K/UL           Current Med List in Hospital:      Current Facility-Administered Medications          Medication  Dose  Route  Frequency           ?  ferrous sulfate tablet 325 mg   1 Tablet  Oral  BID WITH MEALS     ?  valsartan (DIOVAN) tablet  160 mg   160 mg  Oral  DAILY     ?  apixaban (ELIQUIS) tablet 5 mg   5 mg  Oral  Q12H     ?  lip protectant (BLISTEX) ointment 1 Each   1 Each  Topical  PRN     ?  famotidine (PEPCID) tablet 20 mg   20 mg  Oral  DAILY     ?  senna-docusate (PERICOLACE) 8.6-50 mg per tablet 2 Tablet   2 Tablet  Oral  BID     ?  QUEtiapine (SEROquel) tablet 50 mg   50 mg  Oral  QHS PRN     ?  metoprolol tartrate (LOPRESSOR) tablet 50 mg   50 mg  Oral  BID     ?  atorvastatin (LIPITOR) tablet 10 mg   10 mg  Oral  QHS     ?  trospium (SANCTURA) tablet 20 mg   20 mg  Oral  DAILY     ?  sodium chloride (NS) flush 5-40 mL   5-40 mL  IntraVENous  Q8H     ?  sodium chloride (NS) flush 5-40 mL   5-40 mL  IntraVENous  PRN     ?  acetaminophen (TYLENOL) tablet 650 mg   650 mg  Oral  Q4H PRN     ?  naloxone (  NARCAN) injection 0.4 mg   0.4 mg  IntraVENous  PRN     ?  promethazine (PHENERGAN) with saline injection 12.5 mg   12.5 mg  IntraVENous  Q6H PRN     ?  0.9% sodium chloride infusion 250 mL   250 mL  IntraVENous  PRN     ?  calcium carbonate (TUMS) chewable tablet 200 mg [elemental]   200 mg  Oral  TID PRN           ?  sucralfate (CARAFATE) tablet 1 g   1 g  Oral  AC&HS             Allergies        Allergen  Reactions         ?  Omeprazole Magnesium  Itching          Immunization History        Administered  Date(s) Administered         ?  Covid-19, MODERNA, Mrna, Lnp-s, Pf, 12mcg/0.5mL  03/11/2019         ?  TB Skin Test (PPD) Intradermal  08/03/2019           Recent Vital Data:   Patient Vitals for the past 24 hrs:            Temp  Pulse  Resp  BP  SpO2            08/12/19 0656  98.4 ??F (36.9 ??C)  72  16  (!) 117/52  97 %            08/12/19 0327  98.7 ??F (37.1 ??C)  76  16  (!) 118/58  96 %     08/11/19 2353  97.6 ??F (36.4 ??C)  78  16  108/60  97 %     08/11/19 1935  98.9 ??F (37.2 ??C)  80  16  (!) 136/50  99 %            08/11/19 1535  98.4 ??F (36.9 ??C)  75  16  (!) 113/43  98 %        Oxygen Therapy   O2 Sat (%): 97 % (08/12/19 0656)    Pulse via Oximetry: 72 beats per minute (08/05/19 1600)   O2 Device: None (Room air) (08/12/19 0820)   Skin Assessment: Clean, dry, & intact (08/04/19 1922)   Skin Protection for O2 Device: No (08/04/19 1922)   O2 Flow Rate (L/min): 0 l/min (08/05/19 0755)      Estimated body mass index is 33.05 kg/m?? as calculated from the following:     Height as of this encounter: 5\' 3"  (1.6 m).     Weight as of this encounter: 84.6 kg (186 lb 9.6 oz).   No intake or output data in the 24 hours ending 08/12/19 1122        Physical Exam:   General:    Well nourished.  No overt distress   Head:  Normocephalic, atraumatic   Eyes:  Sclerae appear normal.  Pupils equally round.     HENT:  Nares appear normal, no drainage.  Moist mucous membranes   Neck:  No restricted ROM.  Trachea midline   CV:   RRR.  No m/r/g.  No JVD   Lungs:   Appears even, unlabored   Abdomen:   S/nt/nondistended.     Extremities: Warm and dry.  No cyanosis or clubbing.  No edema.  Skin:     No rashes.  Normal turgor.  Normal coloration   Neuro:  Cranial nerves II-XII grossly intact.     Psych:  Normal mood and affect.         Signed:   Gloriann Loan, MD      Part of this note may have been written by using a voice dictation software.  The note has been proof read but may still contain some grammatical/other typographical errors.

## 2019-08-12 NOTE — Progress Notes (Signed)
 Problem: Mobility Impaired (Adult and Pediatric)  Goal: *Acute Goals and Plan of Care (Insert Text)  Outcome: Progressing Towards Goal  Note: STG:  (1.)Brandy Vargas will move from supine to sit and sit to supine  with MODERATE ASSIST within 5 treatment day(s).   (2.)Brandy Vargas will transfer from bed to chair and chair to bed with MODERATE ASSIST using the least restrictive device within 5 treatment day(s).  Met 08/06/19  (3.)Brandy Vargas will ambulate with MODERATE ASSIST for 10 feet with the least restrictive device within 5 treatment day(s).  Met 08/06/19     LTG:  (1.)Brandy Vargas will move from supine to sit and sit to supine  in bed with CONTACT GUARD ASSIST within 10 treatment day(s).    (2.)Brandy Vargas will transfer from bed to chair and chair to bed with CONTACT GUARD ASSIST using the least restrictive device within 10 treatment day(s).  Goal met  (3.)Brandy Vargas will ambulate with CONTACT GUARD ASSIST for 50 feet with the least restrictive device within 10 treatment day(s). Goal met  NEW GOALS:  (4.)Brandy Vargas will ambulate up/down 5 steps with B HRs and CGA within 5 treatment days. 2 steps with B HRs and SBA-08/12/19  (5.)Brandy Vargas will ambulate 200' with supervision within 5 treatment days.  (6.)Brandy Vargas will transfer between the bed and the chair with supervision within 5 treatment days.  Met 08/12/19  ________________________________________________________________________________________________       PHYSICAL THERAPY: Daily Note, Re-evaluation and AM 08/12/2019  INPATIENT: PT Visit Days : 1  Payor: AARP MEDICARE COMPLETE / Plan: [facility] AARP MEDICARE COMPLETE / Product Type: Managed Care Medicare /       NAME/AGE/GENDER: Brandy Vargas is a 79 y.o. female   PRIMARY DIAGNOSIS: Acute hyponatremia [E87.1]  ABLA (acute blood loss anemia) [D62]  Acute metabolic encephalopathy [G93.41] Acute hyponatremia Acute hyponatremia       ICD-10: Treatment Diagnosis:    Generalized Muscle Weakness (M62.81)  Other lack  of cordination (R27.8)  Difficulty in walking, Not elsewhere classified (R26.2)   Precaution/Allergies:  Omeprazole magnesium      ASSESSMENT:     Brandy Vargas presents with above diagnoses.  Patient underwent L THA on 07/30/19.  She d/c'd home with assist from her son on 08/01/19.  At time of d/c, she was ambulatory with SBA and was able to negotiate steps with B rails.  Son reports patient did not do well once she returned home with reports of weakness and inability to walk.  She also developed significant confusion.      Patient up in chair and depressed about son wanting her to go to SNF.  Patient reports she cried last night because of it.  Upset about having been in hospital so long.  Patient has made great progress.  Transfers and gait with RW with supervision only.  Able to negotiate steps with B rails and SBA.  Progressed to standing exercises today.  Patient certainly able to d/c home with [facility] but SW reports son still waiting on SNF approval.      This section established at most recent assessment   PROBLEM LIST (Impairments causing functional limitations):  Decreased Strength  Decreased ADL/Functional Activities  Decreased Transfer Abilities  Decreased Ambulation Ability/Technique  Decreased Balance  Increased Pain  Decreased Activity Tolerance  Decreased Flexibility/Joint Mobility  Edema/Girth  Decreased Knowledge of Precautions  Decreased Independence with Home Exercise Program  Decreased Cognition   INTERVENTIONS PLANNED: (Benefits and precautions of physical therapy have been discussed  with the patient.)  Balance Exercise  Bed Mobility  Family Education  Gait Training  Home Exercise Program (HEP)  Therapeutic Activites  Therapeutic Exercise/Strengthening  Transfer Training     TREATMENT PLAN: Frequency/Duration: daily for duration of hospital stay  Rehabilitation Potential For Stated Goals: Good     REHAB RECOMMENDATIONS (at time of discharge pending progress):    Placement:  It is my opinion, based on  this patient's performance to date, that Brandy Vargas may benefit from HOME HEALTH THERAPY after discharge due to the functional deficits listed above that are likely to improve with skilled rehabilitation because he/she has multiple medical issues that affect his/her functional mobility in the community.  Equipment:   None at this time              HISTORY:   History of Present Injury/Illness (Reason for Referral):  Brandy Vargas is a 79 year old Brandy Vargas with a PMH of PAF, HTN, GERD, and h/o breast CA who presented to the ER with 3-4 days of nausea and vomiting and 24 hours of waxing and waning confusion and hallucinating. She had a left THA on 07/30/19 and did well with the surgery. Per the son, the patient was nauseated and had some vomiting post-op. She continued to have this, but was discharged on 08/01/19. The patient went home and continued to have vomiting and was unable to hold anything down. Then on the evening of 08/02/19 she became confused and was seeing ants on her walls. Her son got her comfortable in the chair and she fell asleep. On the morning of 08/03/19 he found the patient in the floor so he called EMS. Complains of indigestion. Denies CP/SOB. Denies dizziness. Denies diarrhea. Denies abdominal pain.  Past Medical History/Comorbidities:   Brandy Vargas  has a past medical history of Arthritis, Atrial fibrillation (HCC), Breast cancer (HCC) (2015), COVID-19 vaccine series completed (04/08/2019), GERD (gastroesophageal reflux disease), Hypertension, and Overactive bladder. She also has no past medical history of Malignant hyperthermia due to anesthesia or Pseudocholinesterase deficiency.  Brandy Vargas  has a past surgical history that includes hx appendectomy; hx tubal ligation (1976); pr breast surgery procedure unlisted (Right, 2015); hx knee arthroscopy; and hx breast lumpectomy (Right).  Social History/Living Environment:   Home Environment: Private residence  # Steps to Enter: 5  One/Two Story Residence:  One story  Living Alone: Yes  Support Systems: Child(ren)  Patient Expects to be Discharged to:: Unknown  Current DME Used/Available at Home: Environmental consultant, rollator, [place], straight  Prior Level of Function/Work/Activity:  THA 07/30/19.  Independent prior to THA.     Number of Personal Factors/Comorbidities that affect the Plan of Care: 1-2: MODERATE COMPLEXITY   EXAMINATION:   Most Recent Physical Functioning:   Gross Assessment:  AROM: Generally decreased, functional  Strength: Generally decreased, functional  Coordination: Within functional limits               Posture:  Posture (WDL): Exceptions to WDL  Posture Assessment: Forward head, Rounded shoulders  Balance:  Sitting: Intact  Standing: With support Bed Mobility:     Wheelchair Mobility:     Transfers:  Sit to Stand: Supervision  Stand to Sit: Supervision  Bed to Chair: Supervision  Gait:  Left Side Weight Bearing: As tolerated  Base of Support: Center of gravity altered  Speed/Cadence: Pace decreased (<100 feet/min)  Step Length: Left shortened;Right shortened  Stance: Left decreased  Gait Abnormalities: Antalgic  Distance (ft): 90 Feet (ft)  Assistive  Device: Walker, rolling  Ambulation - Level of Assistance: Supervision  Number of Stairs Trained: 2 (x 2)  Stairs - Level of Assistance: Stand-by assistance  Rail Use: Both  Interventions: Safety awareness training;Verbal cues      Body Structures Involved:  Joints  Muscles Body Functions Affected:  Movement Related Activities and Participation Affected:  Mobility   Number of elements that affect the Plan of Care: 4+: HIGH COMPLEXITY   CLINICAL PRESENTATION:   Presentation: Evolving clinical presentation with changing clinical characteristics: MODERATE COMPLEXITY   CLINICAL DECISION MAKING:   [employer] AM-PACT "6 Clicks"   Basic Mobility Inpatient Short Form  How much difficulty does the patient currently have... Unable A Lot A Little None   1.  Turning over in bed (including adjusting bedclothes, sheets and  blankets)?   []  1   []  2   [x]  3   []  4   2.  Sitting down on and standing up from a chair with arms ( e.g., wheelchair, bedside commode, etc.)   []  1   []  2   [x]  3   []  4   3.  Moving from lying on back to sitting on the side of the bed?   []  1   []  2   [x]  3   []  4   How much help from another person does the patient currently need... Total A Lot A Little None   4.  Moving to and from a bed to a chair (including a wheelchair)?   []  1   []  2   []  3   [x]  4   5.  Need to walk in hospital room?   []  1   []  2   []  3   [x]  4   6.  Climbing 3-5 steps with a railing?   []  1   []  2   [x]  3   []  4    2007, Trustees of [address], under license to [place], [place]. All rights reserved      Score:  Initial: 11 Most Recent: 20 (Date: 08/12/19 )    Interpretation of Tool:  Represents activities that are increasingly more difficult (i.e. Bed mobility, Transfers, Gait).    Medical Necessity:     Patient is expected to demonstrate progress in   strength, range of motion, balance, coordination, and functional technique   to   decrease assistance required with mobility.  .  Reason for Services/Other Comments:  Patient continues to require skilled intervention due to   Weakness and confusion limiting mobility.  .   Use of outcome tool(s) and clinical judgement create a POC that gives a: Questionable prediction of patient's progress: MODERATE COMPLEXITY            TREATMENT:   (In addition to Assessment/Re-Assessment sessions the following treatments were rendered)   Pre-treatment Symptoms/Complaints:  Patient agreeable.    Pain: Initial:   Pain Intensity 1: 3  Post Session:  3/10     Therapeutic Activity: (   40 minutes):  Therapeutic activities including  chair transfers, ambulation on level ground, stairs, and exercises as below to improve mobility, strength, balance, and coordination.  Required moderate Safety awareness training;Verbal cues to promote static and dynamic balance in sitting and standing and promote coordination  of bilateral, lower extremity(s).     Therapeutic Exercise: ( ):  Exercises per grid below to improve strength.  Required minimal verbal cues to promote proper body alignment.      Date:  08/12/19 Date:     Date:     Activity/Exercise Parameters Parameters Parameters   Ankle pumps 20     Quad sets 20     Gluteal squeeze      Hip abduction/adduction      Heel slides      Short arc quad      Long arc quad      Seated marches      Toe raises-standing 12 B     Heel raises-standing 12 B     Marching-standing 12 B     Hip abduction/adduction-standing 12 B           Braces/Orthotics/Lines/Etc:   O2 Device: None (Room air)  Treatment/Session Assessment:    Response to Treatment:  Patient participated well and moving well.  Could d/c home if son willing.  Interdisciplinary Collaboration:   Physical Therapist  Registered Nurse  Social Worker  After treatment position/precautions:   Up in chair  Bed/Chair-wheels locked  Call light within reach   Compliance with Program/Exercises: Will assess as treatment progresses  Recommendations/Intent for next treatment session:  Next visit will focus on advancements to more challenging activities and reduction in assistance provided.  Total Treatment Duration:  PT Patient Time In/Time Out  Time In: 0950  Time Out: 1020    Dwayne JINNY Fuelling, PT

## 2019-08-12 NOTE — Progress Notes (Signed)
Report called to [facility].

## 2019-08-12 NOTE — Progress Notes (Signed)
 Problem: Self Care Deficits Care Plan (Adult)  Goal: *Acute Goals and Plan of Care (Insert Text)  08/05/2019 1214 by Delores Grizzle B, OT  Outcome: Progressing Towards Goal  Note:   1. Patient will perform grooming with stand by assistance seated edge of bed. GOAL MET 08/06/2019 standing at sink 08/10/2019   2. Patient will perform Upper body dressing with stand by assistance seated edge of bed GOAL MET 08/10/2019  3. Patient will perform bathing with moderate assistance seated edge of bed or on shower chair.   4. Patient will perform toilet transfers with moderate assistance bed to [facility].GOAL MET 08/10/2019  5. Patient will participate in 30 + minutes of ADL/ therapeutic exercise/therapeutic activity with min rest breaks to increase activity tolerance for self care.  6. Patient will perform ADL functional mobility in room (bed to chair, bed to [facility]) with moderate assistance. GOAL MET 08/10/2019 up to bathroom for toileting with minimal assistance for clothing only.     Goals to be achieved in 7 days.     New goals 08/10/2019    2. Patient will perform Upper body dressing with supervision  3. Patient will perform lower body dressing with supervision adaptive equipment as needed.   4. Patient will perform upper and lower body bathing with minimal assistance seated on shower chair.   5. Patient will perform toilet transfers with supervision.   6. Patient will perform shower transfer with stand by assistance.   7. Patient will participate in 30 + minutes of ADL/ therapeutic exercise/therapeutic activity with min rest breaks to increase activity tolerance for self care.  8. Patient will perform ADL functional mobility in room with stand by assistance.     Goals to be achieved in 7 days.      OCCUPATIONAL THERAPY: Daily Note and AM 08/12/2019  INPATIENT: OT Visit Days: 6  Payor: AARP MEDICARE COMPLETE / Plan: [facility] AARP MEDICARE COMPLETE / Product Type: Managed Care Medicare /      NAME/AGE/GENDER: Brandy Vargas is a 79 y.o.  female   PRIMARY DIAGNOSIS:  Acute hyponatremia [E87.1]  ABLA (acute blood loss anemia) [D62]  Acute metabolic encephalopathy [G93.41] Acute hyponatremia Acute hyponatremia       ICD-10: Treatment Diagnosis:    Generalized Muscle Weakness (M62.81)  Other lack of cordination (R27.8)   Precautions/Allergies:     Omeprazole magnesium      ASSESSMENT:     Brandy Vargas presents with with above diagnosis. Pt was discharged on Saturday after left THA. Pt was seen in ICU with PT present. Noted upon entry drainage from left hip dressing, RN notified and assisted pt to roll for dressing change. Pt assisted with upper body sponge off and pt washed her face and assisted with gown change. Pt up to edge of bed and worked on standing and taking side steps. Pt is noted to have significant generalized weakness, decreased activity tolerance and mobility. Pt also noted to have some confusion. Pt would benefit from OT while in the hospital to maximize safety and independence with self care and functional mobility. Pt would likely benefit from STR as she currently needs quick a bit of assistance.     08/06/2019  Pt was out of ICU and in a regular room. Pt seen with PT due to previous difficulty of mobility. Pt was oriented x 4 today and seems cognitively intact. Pt also moved much better leading the son to consider taking her home. OT will continue plan of care.  08/07/2019  Pt was seen in room with son present. Pt continue to make progress with mobility and self care.  Pt up in room with walker, to bathroom for grooming and self care seated at sink. Pt make to recliner and left with son present. Pt tolerated well will continue plan of care.     08/08/19 Pt was sitting in chair upon arrival. Pt completed functional mobility with rolling walker.  Pt completed toilet transfer with SBA. Pt completed toileting with supervision. Pt stood at sink to completed grooming. Pt is progressing towards goals. Continue POC.     08/10/2019 Pt seen in room  this morning but declined activity due to being tired. Pt was concerned about going to rehab as she would like to go home. Pt lives alone and would benefit from STR to maximize safety and independence with self care and functional mobility. Encouraged pt to take STR as an opportunity and not a negative thing. Pt agreed.  Returned to see pt in afternoon for toileting, donning and doffing pants, and standing at sink for grooming. Pt is making good progress. New goals set and continue OT.     08/12/19 Pt was sitting up in chair. Pt completed the exercises below on B UE's with green thera band. Continue POC.       This section established at most recent assessment   PROBLEM LIST (Impairments causing functional limitations):  Decreased Strength  Decreased ADL/Functional Activities  Decreased Transfer Abilities  Decreased Ambulation Ability/Technique  Decreased Balance  Increased Pain  Decreased Activity Tolerance  Increased Fatigue  Decreased Cognition   INTERVENTIONS PLANNED: (Benefits and precautions of occupational therapy have been discussed with the patient.)  Activities of daily living training  Adaptive equipment training  Balance training  Cognitive training  Therapeutic activity  Therapeutic exercise     TREATMENT PLAN: Frequency/Duration: Follow patient 3 times per week to address above goals.  Rehabilitation Potential For Stated Goals: Good     REHAB RECOMMENDATIONS (at time of discharge pending progress):    Placement:  It is my opinion, based on this patient's performance to date, that Brandy Vargas may benefit from intensive therapy at a [facility] FACILITY after discharge due to the functional deficits listed above that are likely to improve with skilled rehabilitation and concerns that he/she may be unsafe to be unsupervised at home due to confusion and amount of assistance required .  Equipment:   None at this time              OCCUPATIONAL PROFILE AND HISTORY:   History of Present Injury/Illness  (Reason for Referral):  See H&P  Past Medical History/Comorbidities:   Brandy Vargas  has a past medical history of Arthritis, Atrial fibrillation (HCC), Breast cancer (HCC) (2015), COVID-19 vaccine series completed (04/08/2019), GERD (gastroesophageal reflux disease), Hypertension, and Overactive bladder. She also has no past medical history of Malignant hyperthermia due to anesthesia or Pseudocholinesterase deficiency.  Brandy Vargas  has a past surgical history that includes hx appendectomy; hx tubal ligation (1976); pr breast surgery procedure unlisted (Right, 2015); hx knee arthroscopy; and hx breast lumpectomy (Right).  Social History/Living Environment:   Home Environment: Private residence  # Steps to Enter: 5  One/Two Story Residence: One story  Living Alone: Yes  Support Systems: Child(ren)  Patient Expects to be Discharged to:: Unknown  Current DME Used/Available at Home: Environmental consultant, rollator, Medical laboratory scientific officer, straight  Prior Level of Function/Work/Activity:  Independent with all activities of daily living to include driving.  Number of Personal Factors/Comorbidities that affect the Plan of Care: Brief history (0):  LOW COMPLEXITY   ASSESSMENT OF OCCUPATIONAL PERFORMANCE::   Activities of Daily Living:   Basic ADLs (From Assessment) Complex ADLs (From Assessment)   Feeding: Setup  Oral Facial Hygiene/Grooming: Minimum assistance  Bathing: Maximum assistance  Upper Body Dressing: Minimum assistance  Lower Body Dressing: Total assistance  Toileting: Total assistance     Grooming/Bathing/Dressing Activities of Daily Living                             Bed/Mat Mobility  Sit to Stand: Supervision  Stand to Sit: Supervision  Bed to Chair: Supervision     Most Recent Physical Functioning:   Gross Assessment:                  Posture:  Posture (WDL): Exceptions to WDL  Posture Assessment: Forward head, Rounded shoulders  Balance:  Sitting: Intact  Standing: With support Bed Mobility:     Wheelchair Mobility:     Transfers:  Sit  to Stand: Supervision  Stand to Sit: Supervision  Bed to Chair: Supervision            Patient Vitals for the past 6 hrs:   BP BP Patient Position SpO2 Pulse   08/12/19 0656 (!) 117/52 Sitting 97 % 72       Mental Status  Neurologic State: Alert  Orientation Level: Oriented X4  Cognition: Follows commands  Perception: Appears intact  Perseveration: No perseveration noted  Safety/Judgement: Fall prevention                          Physical Skills Involved:  Range of Motion  Balance  Activity Tolerance  Pain (acute) Cognitive Skills Affected (resulting in the inability to perform in a timely and safe manner):  Executive Function  Short Term Recall  Sustained Attention Psychosocial Skills Affected:  Environmental Adaptation   Number of elements that affect the Plan of Care: 5+:  HIGH COMPLEXITY   CLINICAL DECISION MAKING:   [employer] AM-PACT "6 Clicks"   Daily Activity Inpatient Short Form  How much help from another person does the patient currently need... Total A Lot A Little None   1.  Putting on and taking off regular lower body clothing?   [x]  1   []  2   []  3   []  4   2.  Bathing (including washing, rinsing, drying)?   []  1   [x]  2   []  3   []  4   3.  Toileting, which includes using toilet, bedpan or urinal?   [x]  1   []  2   []  3   []  4   4.  Putting on and taking off regular upper body clothing?   []  1   [x]  2   []  3   []  4   5.  Taking care of personal grooming such as brushing teeth?   []  1   []  2   [x]  3   []  4   6.  Eating meals?   []  1   []  2   []  3   [x]  4    2007, Trustees of [address], under license to [place], [place]. All rights reserved      Score:  Initial: 13 Most Recent: X (Date: -- )    Interpretation of Tool:  Represents activities that are  increasingly more difficult (i.e. Bed mobility, Transfers, Gait).     Use of outcome tool(s) and clinical judgement create a POC that gives a: LOW COMPLEXITY         TREATMENT:   (In addition to Assessment/Re-Assessment sessions the following  treatments were rendered)     Pre-treatment Symptoms/Complaints:    Pain: Initial:   Pain Intensity 1: 0 3 Post Session:  3     Therapeutic Exercise: (  10):  Exercises per grid below to improve mobility and strength.  Required min verbal cues to promote proper body posture and promote proper body mechanics.  Progressed range and repetitions as indicated.    B UE's with green thea band  Date:  08/12/19 Date:   Date:     Activity/Exercise Parameters Parameters Parameters   Shoulder flex/ex 10 reps      Elbow flex/ex 10 reps      Shoulder hor abd/add 10 reps      Punches  10 reps                              Braces/Orthotics/Lines/Etc:   O2 Device: None (Room air)  Treatment/Session Assessment:    Response to Treatment:  pt tolerated fair.   Interdisciplinary Collaboration:   Physical Therapist  Certified Occupational Therapy Assistant  Registered Nurse  After treatment position/precautions:   Up in chair  Bed/Chair-wheels locked  Call light within reach  RN notified   Compliance with Program/Exercises: Compliant all of the time, Will assess as treatment progresses.  Recommendations/Intent for next treatment session:  Next visit will focus on advancements to more challenging activities and reduction in assistance provided.  Total Treatment Duration:  OT Patient Time In/Time Out  Time In: 1050  Time Out: [address] [place]

## 2019-08-26 ENCOUNTER — Ambulatory Visit: Attending: Surgical | Primary: Family Medicine

## 2019-08-26 ENCOUNTER — Ambulatory Visit: Admit: 2019-08-26 | Discharge: 2019-08-26 | Payer: MEDICARE | Attending: Surgical | Primary: Family Medicine

## 2019-08-26 ENCOUNTER — Encounter: Admit: 2019-08-26 | Discharge: 2019-08-26 | Payer: MEDICARE | Primary: Family Medicine

## 2019-08-26 DIAGNOSIS — Z96642 Presence of left artificial hip joint: Secondary | ICD-10-CM

## 2019-08-26 NOTE — Progress Notes (Signed)
Name: Brandy Vargas  Date of Birth: [birth year]  Gender: female  MRN: [id number]      Current Outpatient Medications:   ???  aspirin 81 mg chewable tablet, Take 1 Tablet by mouth daily. MAY DECREASE ASPIRIN DOSAGE TO ONCE DAILY ONCE RESTART ON ELIQUIS., Disp: 30 Tablet, Rfl: 2  ???  ferrous sulfate 325 mg (65 mg iron) tablet, Take 1 Tablet by mouth Daily (before breakfast) for 30 days., Disp: 30 Tablet, Rfl: 0  ???  telmisartan (MICARDIS) 80 mg tablet, Take 1 tablet by mouth once daily, Disp: 90 Tablet, Rfl: 0  ???  ondansetron hcl (ZOFRAN) 8 mg tablet, Take 1 Tablet by mouth every eight (8) hours as needed for Nausea or Vomiting. Indications: prevent nausea and vomiting after surgery, Disp: 30 Tablet, Rfl: 0  ???  Eliquis 5 mg tablet, Take 1 Tablet by mouth two (2) times a day. MAY DECREASE ASPIRIN DOSAGE TO ONCE DAILY  AND RESTART ELIQUIS ON Tuesday, AUGUST 3RD, IF NO WOUND DRAINAGE., Disp: 1 Tablet, Rfl: 0  ???  solifenacin (VESICARE) 10 mg tablet, Take 1 tablet by mouth once daily, Disp: 30 Tablet, Rfl: 0  ???  calcium carbonate (OS-CAL) 500 mg calcium (1,250 mg) tablet, Take 1 Tablet by mouth daily., Disp: , Rfl:   ???  cholecalciferol (VITAMIN D3) (2,000 UNITS /50 MCG) cap capsule, Take 2,000 Units by mouth daily., Disp: , Rfl:   ???  metoprolol tartrate (LOPRESSOR) 50 mg tablet, TAKE 1 TABLET BY MOUTH TWICE DAILY, Disp: 180 Tablet, Rfl: 3  ???  simvastatin (ZOCOR) 20 mg tablet, TAKE 1 TABLET BY MOUTH ONCE DAILY (Patient taking differently: Take 20 mg by mouth nightly.), Disp: 90 Tab, Rfl: 3  ???  dilTIAZem IR (CARDIZEM) 30 mg tablet, Take 1 tablet po prn q 4h for afib. Heart rate >100, Disp: , Rfl:   Allergies   Allergen Reactions   ??? Omeprazole Magnesium Itching       Post-op left THA    HPI: The patient returns now 4 weeks post left total hip arthroplasty.  Their pain is diminishing and the wound healing.  Patient was readmitted shortly after discharge for hyponatremia and acute metabolic encephalopathy.  Once her sodium level was  stabilized with fluid therapy and her AMS resolved she was discharged to [facility] for rehab before returning home last Friday.  She reports moderate swelling in bilateral lower legs since being discharged from the hospital.  Denies chest pain, shortness of breath, nausea, or vomiting.  She takes Eliquis due to history of a-ifb  No other new complaints.    PE: Patient is alert and oriented, no acute distress.  Gait with mild trendelenburg.  Uses a walker for balance.  Semination of left hip reveals well-healing surgical incision present.  There is no periwound erythema, induration, or drainage present.  There is 2+ edema noted throughout bilateral lower extremities.  Calves are soft with minimal tenderness.  There is no palpable cords present.  Negative Homan's. Light touch sensation and motor function intact throughout bilateral lower extremities.    Radiographs:  AP pelvis and lateral views of the left hip were taken in the office today and reveal good bone, prosthetic appearance.  The hip is properly located.    Radiographic Diagnosis:  Normal post-operative left total hip.    Impression: 4 weeks status post left total hip arthroplasty    Recommendations:    Reviewed x-ray findings with patient.  Message was left with the patient's PCP, Dr. Hollice Espy  at [facility] to address lower extremity edema and to arrange close follow up.  Patient is to increase their weight bearing status as tolerated.  A cane can be used in transition.  They are to follow the routine dislocation precautions.  They are to wean from their pain medications as tolerated.  I will see them back for their 6 month follow up and radiographs.

## 2019-08-28 ENCOUNTER — Ambulatory Visit: Attending: Family Medicine | Primary: Family Medicine

## 2019-08-28 ENCOUNTER — Encounter: Attending: Orthopaedic Surgery | Primary: Family Medicine

## 2019-08-28 ENCOUNTER — Ambulatory Visit: Admit: 2019-08-28 | Discharge: 2019-08-28 | Payer: MEDICARE | Attending: Family Medicine | Primary: Family Medicine

## 2019-08-28 DIAGNOSIS — R600 Localized edema: Secondary | ICD-10-CM

## 2019-08-28 LAB — AMB POC COMPLETE CBC,AUTOMATED ENTER
ABS. GRANS (POC): 4.7 10*3/uL (ref 2.0–7.8)
ABS. LYMPHS (POC): 1.6 10*3/uL (ref 0.6–4.1)
GRANULOCYTES (POC): 67.1 % (ref 37.0–92.0)
Granulocytes %, POC: 67.1 % (ref 37.0–92.0)
Granulocytes Abs: 4.7 10*3/uL (ref 2.0–7.8)
HCT (POC): 32.7 % — AB (ref 37.0–51.0)
HGB (POC): 10.3 g/dL — AB (ref 12.0–18.0)
Hematocrit, POC: 32.7 % — AB (ref 37.0–51.0)
Hemoglobin, POC: 10.3 g/dL — AB (ref 12.0–18.0)
LYMPHOCYTES (POC): 23.2 % (ref 10.0–58.5)
Lymphocyte %: 23.2 % (ref 10.0–58.5)
Lymphs Abs: 1.6 10*3/uL (ref 0.6–4.1)
MCH (POC): 28.7 pg (ref 26.0–32.0)
MCH: 28.7 pg (ref 26.0–32.0)
MCHC (POC): 31.5 g/dL (ref 31.0–36.0)
MCHC: 31.5 g/dL (ref 31.0–36.0)
MCV (POC): 91 fL (ref 80.0–97.0)
MCV: 91 fL (ref 80.0–97.0)
MID% POC: 9.7 % (ref 0.1–24.0)
MPV (POC): 7.9 fL (ref 0.0–49.9)
MPV POC: 7.9 fL (ref 0.0–49.9)
Mid # (POC): 0.7 10*3/uL (ref 0.0–1.8)
Mid Cells %, POC: 9.7 % (ref 0.1–24.0)
Mid Cells Absoulute POC: 0.7 10*3/uL (ref 0.0–1.8)
PLATELET (POC): 341 10*3/uL (ref 140–440)
Platelet Count, POC: 341 10*3/uL (ref 140–440)
RBC (POC): 3.59 10*6/uL — AB (ref 4.20–6.30)
RBC, POC: 3.59 10*6/uL — AB (ref 4.20–6.30)
RDW (POC): 16.2 % — AB (ref 11.5–14.5)
RDW, POC: 16.2 % — AB (ref 11.5–14.5)
WBC (POC): 7 10*3/uL (ref 4.1–10.9)
WBC, POC: 7 10*3/uL (ref 4.1–10.9)

## 2019-08-28 MED ORDER — POTASSIUM CHLORIDE SR 10 MEQ TAB, PARTICLES/CRYSTALS
10 mEq | ORAL_TABLET | Freq: Every day | ORAL | 1 refills | Status: DC
Start: 2019-08-28 — End: 2019-11-02

## 2019-08-28 MED ORDER — FUROSEMIDE 40 MG TAB
40 mg | ORAL_TABLET | Freq: Every day | ORAL | 1 refills | Status: DC
Start: 2019-08-28 — End: 2019-11-02

## 2019-08-28 NOTE — Progress Notes (Signed)
SUBJECTIVE:   Brandy Vargas is a 79 y.o. female who has a past medical history significant for hypertension, paroxysmal atrial fibrillation, high cholesterol and degenerative arthritis.  Since of last seen the patient she is undergone a left total hip replacement.  Postoperatively she became confused and had a syncopal episode.  She was transported to the emergency department via EMS and diagnosed with metabolic encephalopathy, hyponatremia with a sodium of 117 and anemia presumed to be blood loss related with a hemoglobin of 6.6.  Patient was transfused and resuscitated with IV fluids.  She was discharged from the hospital to rehab.  She has been discharged from rehab for 1 week.  She reports no weakness, chest pain, shortness of breath.  Her main complaint today relates to lower extremity edema that started while she was in the rehab facility.  She states that she was on Lasix 20 mg a day but it did not seem to be helping any.  She reports no orthopnea or PND.  She has been taking her iron supplementation as prescribed.  She was discharged home on no diuretic.  Her current medications are listed in the EMR and reviewed today.    HPI  See above    Past Medical History, Past Surgical History, Family history, Social History, and Medications were all reviewed with the patient today and updated as necessary.       Current Outpatient Medications   Medication Sig Dispense Refill   ??? aspirin 81 mg chewable tablet Take 1 Tablet by mouth daily. MAY DECREASE ASPIRIN DOSAGE TO ONCE DAILY ONCE RESTART ON ELIQUIS. 30 Tablet 2   ??? ferrous sulfate 325 mg (65 mg iron) tablet Take 1 Tablet by mouth Daily (before breakfast) for 30 days. 30 Tablet 0   ??? telmisartan (MICARDIS) 80 mg tablet Take 1 tablet by mouth once daily 90 Tablet 0   ??? ondansetron hcl (ZOFRAN) 8 mg tablet Take 1 Tablet by mouth every eight (8) hours as needed for Nausea or Vomiting. Indications: prevent nausea and vomiting after surgery 30 Tablet 0   ??? Eliquis 5  mg tablet Take 1 Tablet by mouth two (2) times a day. MAY DECREASE ASPIRIN DOSAGE TO ONCE DAILY  AND RESTART ELIQUIS ON Tuesday, AUGUST 3RD, IF NO WOUND DRAINAGE. 1 Tablet 0   ??? solifenacin (VESICARE) 10 mg tablet Take 1 tablet by mouth once daily 30 Tablet 0   ??? calcium carbonate (OS-CAL) 500 mg calcium (1,250 mg) tablet Take 1 Tablet by mouth daily.     ??? cholecalciferol (VITAMIN D3) (2,000 UNITS /50 MCG) cap capsule Take 2,000 Units by mouth daily.     ??? metoprolol tartrate (LOPRESSOR) 50 mg tablet TAKE 1 TABLET BY MOUTH TWICE DAILY 180 Tablet 3   ??? simvastatin (ZOCOR) 20 mg tablet TAKE 1 TABLET BY MOUTH ONCE DAILY (Patient taking differently: Take 20 mg by mouth nightly.) 90 Tab 3   ??? dilTIAZem IR (CARDIZEM) 30 mg tablet Take 1 tablet po prn q 4h for afib. Heart rate >100       Allergies   Allergen Reactions   ??? Omeprazole Magnesium Itching     Patient Active Problem List   Diagnosis Code   ??? 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 Vitamin D deficiency E55.9   ??? Paroxysmal atrial fibrillation (HCC) I48.0   ??? Essential hypertension I10   ??? 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  Leukocytosis D72.829     Past Medical History:   Diagnosis Date   ??? Arthritis    ??? Atrial fibrillation (HCC)    ??? Breast cancer (HCC) 2015    right    ??? COVID-19 vaccine series completed 04/08/2019    Moderna   ??? GERD (gastroesophageal reflux disease)    ??? Hypertension    ??? Overactive bladder      Past Surgical History:   Procedure Laterality Date   ??? HX APPENDECTOMY     ??? HX BREAST LUMPECTOMY Right     2015   ??? HX KNEE ARTHROSCOPY     ??? HX TUBAL LIGATION  1976   ??? PR BREAST SURGERY PROCEDURE UNLISTED Right 2015    cancer     Family History   Problem Relation Age of Onset   ??? Hypertension Mother    ??? High Cholesterol Mother    ??? Heart Attack Father    ??? Diabetes Sister    ??? Diabetes Paternal Grandmother    ??? Breast Cancer Maternal Aunt      Social History     Tobacco Use   ???  Smoking status: Never Smoker   ??? Smokeless tobacco: Never Used   Substance Use Topics   ??? Alcohol use: Never         Review of Systems  See above    OBJECTIVE:  Visit Vitals  BP 124/60   Ht 5\' 3"  (1.6 m)   Wt 183 lb (83 kg)   BMI 32.42 kg/m??        Physical Exam  Constitutional:       Appearance: She is well-developed.   HENT:      Head: Normocephalic and atraumatic.   Eyes:      Pupils: Pupils are equal, round, and reactive to light.   Neck:      Thyroid: No thyromegaly.      Vascular: No JVD.   Cardiovascular:      Rate and Rhythm: Normal rate and regular rhythm.      Heart sounds: Normal heart sounds. No murmur heard.   No friction rub. No gallop.    Pulmonary:      Effort: Pulmonary effort is normal. No respiratory distress.      Breath sounds: No wheezing or rales.   Abdominal:      Palpations: Abdomen is soft.      Tenderness: There is no abdominal tenderness. There is no guarding or rebound.   Musculoskeletal:         General: Normal range of motion.      Cervical back: Normal range of motion and neck supple.      Comments: 1+ pitting edema up to the mid shins bilaterally as noted.   Skin:     Findings: No rash.   Neurological:      Mental Status: She is alert and oriented to person, place, and time.         Medical problems and test results were reviewed with the patient today.         ASSESSMENT and PLAN    1.  Lower extremity edema.  There is been a 12 pound weight gain since March 2021.  I believe this is a combination of intraoperative and postoperative fluid replacement and dependent edema.  I will check renal function and hepatic function today.  Repeat CBC shows a hemoglobin up from a discharge level of 7.7 to the current level of 10.3.  We  will start Lasix 40 mg a day with potassium 10 mEq a day.  Encourage elevation of lower extremities.  Reevaluate in 3 to 4 weeks.  Checking sodium today.  Discharge sodium was 137.  If sodium is not at an acceptable level we will discontinue the Lasix.    2.   Hyponatremia.  As above rechecking sodium.  If sodium is low we will stop the Lasix.  Monitor closely.    3.  Anemia.  Felt to be iron deficient/blood loss related.  Responding to oral iron.  Hemoglobin was 13.3 in March preoperatively.  Recheck in 3 to 4 weeks.  Continue iron supplementation.    4.  Degenerative arthritis.  Status post left hip replacement.    5.  Hypertension.  Blood pressure is 124/60.  Monitor with the addition of Lasix.  Anticipate Lasix being temporary.    6.  Paroxysmal atrial fibrillation.  Continue anticoagulation.    7.  High cholesterol.  Continue statin therapy.  Rechecking a scheduled follow-up.    Elements of this note have been dictated using speech recognition software. As a result, errors of speech recognition may have occurred.

## 2019-08-28 NOTE — Progress Notes (Signed)
Brandy Vargas is a 79 y.o. female had labs drawn today for labs per provider at [address] [place] [address].  The patient was drawn at  925am. In the   left arm and the patient tolerated the procedure fine with no issues.       Shary Key

## 2019-08-29 LAB — COMPREHENSIVE METABOLIC PANEL
ALT: 10 IU/L (ref 0–32)
AST: 13 IU/L (ref 0–40)
Albumin/Globulin Ratio: 1.6 NA (ref 1.2–2.2)
Albumin: 3.7 g/dL (ref 3.7–4.7)
Alkaline Phosphatase: 116 IU/L (ref 48–121)
BUN: 17 mg/dL (ref 8–27)
Bun/Cre Ratio: 20 NA (ref 12–28)
CO2: 27 mmol/L (ref 20–29)
Calcium: 9 mg/dL (ref 8.7–10.3)
Chloride: 97 mmol/L (ref 96–106)
Creatinine: 0.84 mg/dL (ref 0.57–1.00)
EGFR IF NonAfrican American: 66 mL/min/{1.73_m2} (ref >59)
GFR African American: 76 mL/min/{1.73_m2} (ref >59)
Globulin, Total: 2.3 g/dL (ref 1.5–4.5)
Glucose: 96 mg/dL (ref 65–99)
Potassium: 4.6 mmol/L (ref 3.5–5.2)
Sodium: 136 mmol/L (ref 134–144)
Total Bilirubin: 0.5 mg/dL (ref 0.0–1.2)
Total Protein: 6 g/dL (ref 6.0–8.5)

## 2019-08-29 LAB — TSH 3RD GENERATION
TSH: 1.11 u[IU]/mL (ref 0.450–4.500)
TSH: 1.11 u[IU]/mL (ref 0.450–4.500)

## 2019-08-29 LAB — METABOLIC PANEL, COMPREHENSIVE
A-G Ratio: 1.6 (ref 1.2–2.2)
ALT (SGPT): 10 IU/L (ref 0–32)
AST (SGOT): 13 IU/L (ref 0–40)
Albumin: 3.7 g/dL (ref 3.7–4.7)
Alk. phosphatase: 116 IU/L (ref 48–121)
BUN/Creatinine ratio: 20 (ref 12–28)
BUN: 17 mg/dL (ref 8–27)
Bilirubin, total: 0.5 mg/dL (ref 0.0–1.2)
CO2: 27 mmol/L (ref 20–29)
Calcium: 9 mg/dL (ref 8.7–10.3)
Chloride: 97 mmol/L (ref 96–106)
Creatinine: 0.84 mg/dL (ref 0.57–1.00)
GFR est AA: 76 mL/min/{1.73_m2} (ref >59)
GFR est non-AA: 66 mL/min/{1.73_m2} (ref >59)
GLOBULIN, TOTAL: 2.3 g/dL (ref 1.5–4.5)
Glucose: 96 mg/dL (ref 65–99)
Potassium: 4.6 mmol/L (ref 3.5–5.2)
Protein, total: 6 g/dL (ref 6.0–8.5)
Sodium: 136 mmol/L (ref 134–144)

## 2019-09-01 MED ORDER — SIMVASTATIN 20 MG TAB
20 mg | ORAL_TABLET | ORAL | 0 refills | Status: DC
Start: 2019-09-01 — End: 2019-10-02

## 2019-09-15 MED ORDER — SOLIFENACIN 10 MG TAB
10 mg | ORAL_TABLET | ORAL | 0 refills | Status: DC
Start: 2019-09-15 — End: 2019-10-16

## 2019-09-21 ENCOUNTER — Ambulatory Visit: Attending: Cardiovascular Disease | Primary: Family Medicine

## 2019-09-21 ENCOUNTER — Ambulatory Visit
Admit: 2019-09-21 | Discharge: 2019-09-21 | Payer: MEDICARE | Attending: Cardiovascular Disease | Primary: Family Medicine

## 2019-09-21 DIAGNOSIS — I499 Cardiac arrhythmia, unspecified: Secondary | ICD-10-CM

## 2019-09-21 NOTE — Progress Notes (Signed)
[place]  [address], [address]  [place] [address]  PHONE: [phone]    SUBJECTIVE: Brandy Vargas (1940-10-11) is a 79 y.o. female seen for a follow up visit regarding the following:        ICD-10-CM ICD-9-CM   1. Irregular heart rate  I49.9 427.9   2. Paroxysmal atrial fibrillation (HCC)  I48.0 427.31   3. Dyslipidemia  E78.5 272.4     Patient had left hip replacement, and readmission for hyponatremia. Doing well since, finished rehab. No chest pain. No palpitations. Patient denies syncope. No dyspnea. States they are taking meds. Maintains a normal level of activity for them without symptoms. No dizziness or lightheadedness. All above conditions stable.    Sees PCP regularly, last lipid panel 04/2019, LDL and TC wnl. Only taken diltiazem 30 mg for HR >100 twice in past year. Taking Lasix 40 mg with potassium supplement for LE swelling.     Past Medical History, Past Surgical History, Family history, Social History, and Medications were all reviewed with the patient today and updated as necessary.    Outpatient Medications Marked as Taking for the 09/21/19 encounter (Office Visit) with Ralston Venus, Orland Penman, MD   Medication Sig Dispense Refill   ??? ferrous sulfate (Iron) 325 mg (65 mg iron) tablet Take  by mouth Daily (before breakfast).     ??? solifenacin (VESICARE) 10 mg tablet Take 1 tablet by mouth once daily 30 Tablet 0   ??? simvastatin (ZOCOR) 20 mg tablet Take 1 tablet by mouth once daily 30 Tablet 0   ??? furosemide (Lasix) 40 mg tablet Take 1 Tablet by mouth daily. 30 Tablet 1   ??? potassium chloride (KLOR-CON) 10 mEq tablet Take 1 Tablet by mouth daily. 30 Tablet 1   ??? aspirin 81 mg chewable tablet Take 1 Tablet by mouth daily. MAY DECREASE ASPIRIN DOSAGE TO ONCE DAILY ONCE RESTART ON ELIQUIS. 30 Tablet 2   ??? telmisartan (MICARDIS) 80 mg tablet Take 1 tablet by mouth once daily 90 Tablet 0   ??? ondansetron hcl (ZOFRAN) 8 mg tablet Take 1 Tablet by mouth every eight (8) hours as needed  for Nausea or Vomiting. Indications: prevent nausea and vomiting after surgery 30 Tablet 0   ??? Eliquis 5 mg tablet Take 1 Tablet by mouth two (2) times a day. MAY DECREASE ASPIRIN DOSAGE TO ONCE DAILY  AND RESTART ELIQUIS ON Tuesday, AUGUST 3RD, IF NO WOUND DRAINAGE. 1 Tablet 0   ??? calcium carbonate (OS-CAL) 500 mg calcium (1,250 mg) tablet Take 1 Tablet by mouth daily.     ??? cholecalciferol (VITAMIN D3) (2,000 UNITS /50 MCG) cap capsule Take 2,000 Units by mouth daily.     ??? metoprolol tartrate (LOPRESSOR) 50 mg tablet TAKE 1 TABLET BY MOUTH TWICE DAILY 180 Tablet 3   ??? dilTIAZem IR (CARDIZEM) 30 mg tablet Take 1 tablet po prn q 4h for afib. Heart rate >100       Allergies   Allergen Reactions   ??? Omeprazole Magnesium Itching     Past Medical History:   Diagnosis Date   ??? Arthritis    ??? Atrial fibrillation ([place])    ??? Breast cancer ([facility]) 2015    right    ??? COVID-19 vaccine series completed 04/08/2019    Moderna   ??? GERD (gastroesophageal reflux disease)    ??? Hypertension    ??? Overactive bladder      Past Surgical History:   Procedure Laterality  Date   ??? HX APPENDECTOMY     ??? HX BREAST LUMPECTOMY Right     2015   ??? HX KNEE ARTHROSCOPY     ??? HX TUBAL LIGATION  1976   ??? PR BREAST SURGERY PROCEDURE UNLISTED Right 2015    cancer     Family History   Problem Relation Age of Onset   ??? Hypertension Mother    ??? High Cholesterol Mother    ??? Heart Attack Father    ??? Diabetes Sister    ??? Diabetes Paternal Grandmother    ??? Breast Cancer Maternal Aunt       Social History     Tobacco Use   ??? Smoking status: Never Smoker   ??? Smokeless tobacco: Never Used   Substance Use Topics   ??? Alcohol use: Never     @JBFAM @    Current Outpatient Medications:   ???  ferrous sulfate (Iron) 325 mg (65 mg iron) tablet, Take  by mouth Daily (before breakfast)., Disp: , Rfl:   ???  solifenacin (VESICARE) 10 mg tablet, Take 1 tablet by mouth once daily, Disp: 30 Tablet, Rfl: 0  ???  simvastatin (ZOCOR) 20 mg tablet, Take 1 tablet by mouth once daily, Disp:  30 Tablet, Rfl: 0  ???  furosemide (Lasix) 40 mg tablet, Take 1 Tablet by mouth daily., Disp: 30 Tablet, Rfl: 1  ???  potassium chloride (KLOR-CON) 10 mEq tablet, Take 1 Tablet by mouth daily., Disp: 30 Tablet, Rfl: 1  ???  aspirin 81 mg chewable tablet, Take 1 Tablet by mouth daily. MAY DECREASE ASPIRIN DOSAGE TO ONCE DAILY ONCE RESTART ON ELIQUIS., Disp: 30 Tablet, Rfl: 2  ???  telmisartan (MICARDIS) 80 mg tablet, Take 1 tablet by mouth once daily, Disp: 90 Tablet, Rfl: 0  ???  ondansetron hcl (ZOFRAN) 8 mg tablet, Take 1 Tablet by mouth every eight (8) hours as needed for Nausea or Vomiting. Indications: prevent nausea and vomiting after surgery, Disp: 30 Tablet, Rfl: 0  ???  Eliquis 5 mg tablet, Take 1 Tablet by mouth two (2) times a day. MAY DECREASE ASPIRIN DOSAGE TO ONCE DAILY  AND RESTART ELIQUIS ON Tuesday, AUGUST 3RD, IF NO WOUND DRAINAGE., Disp: 1 Tablet, Rfl: 0  ???  calcium carbonate (OS-CAL) 500 mg calcium (1,250 mg) tablet, Take 1 Tablet by mouth daily., Disp: , Rfl:   ???  cholecalciferol (VITAMIN D3) (2,000 UNITS /50 MCG) cap capsule, Take 2,000 Units by mouth daily., Disp: , Rfl:   ???  metoprolol tartrate (LOPRESSOR) 50 mg tablet, TAKE 1 TABLET BY MOUTH TWICE DAILY, Disp: 180 Tablet, Rfl: 3  ???  dilTIAZem IR (CARDIZEM) 30 mg tablet, Take 1 tablet po prn q 4h for afib. Heart rate >100, Disp: , Rfl:         ROS:  Review of Systems - General ROS: negative for - chills, fatigue or fever  Hematological and Lymphatic ROS: negative for - bleeding problems, bruising or jaundice  Respiratory ROS: no cough, shortness of breath, or wheezing  Cardiovascular ROS: no chest pain or dyspnea on exertion  Gastrointestinal ROS: no abdominal pain, change in bowel habits, or black or bloody stools  Neurological ROS: no TIA or stroke symptoms  All other systems negative.      Wt Readings from Last 3 Encounters:   09/21/19 177 lb 9.6 oz (80.6 kg)   08/28/19 183 lb (83 kg)   08/12/19 186 lb 9.6 oz (84.6 kg)     Temp Readings from  Last 3  Encounters:   08/12/19 98.3 ??F (36.8 ??C)   08/01/19 97.6 ??F (36.4 ??C)     BP Readings from Last 3 Encounters:   09/21/19 126/60   08/28/19 124/60   08/12/19 (!) 112/52     Pulse Readings from Last 3 Encounters:   09/21/19 (!) 58   08/12/19 70   08/01/19 79           PHYSICAL EXAM:  Visit Vitals  BP 126/60   Pulse (!) 58   Ht 5' 3"  (1.6 m)   Wt 177 lb 9.6 oz (80.6 kg)   BMI 31.46 kg/m??       Physical Examination: General appearance - alert, well appearing, and in no distress  Mental status - alert, oriented to person, place, and time  Eyes - pupils equal and reactive, extraocular eye movements intact  Neck/lymph - supple, no significant adenopathy  Chest/lungs - clear to auscultation, no wheezes, rales or rhonchi, symmetric air entry  Heart/CV - normal rate, regular rhythm, normal S1, S2, no murmurs, rubs, clicks or gallops  Abdomen/GI - soft, nontender, nondistended, no masses or organomegaly  Musculoskeletal - no joint tenderness, deformity or swelling  Extremities - peripheral pulses normal, pedal edema 1+ b/l, no clubbing or cyanosis  Skin - normal coloration and turgor, no rashes, no suspicious skin lesions noted                      Medications reviewed and questions answered    Recent Results (from the past 672 hour(s))   METABOLIC PANEL, COMPREHENSIVE    Collection Time: 08/28/19  9:19 AM   Result Value Ref Range    Glucose 96 65 - 99 mg/dL    BUN 17 8 - 27 mg/dL    Creatinine 0.84 0.57 - 1.00 mg/dL    GFR est non-AA 66 >59 mL/min/1.73    GFR est AA 76 >59 mL/min/1.73    BUN/Creatinine ratio 20 12 - 28    Sodium 136 134 - 144 mmol/L    Potassium 4.6 3.5 - 5.2 mmol/L    Chloride 97 96 - 106 mmol/L    CO2 27 20 - 29 mmol/L    Calcium 9.0 8.7 - 10.3 mg/dL    Protein, total 6.0 6.0 - 8.5 g/dL    Albumin 3.7 3.7 - 4.7 g/dL    GLOBULIN, TOTAL 2.3 1.5 - 4.5 g/dL    A-G Ratio 1.6 1.2 - 2.2    Bilirubin, total 0.5 0.0 - 1.2 mg/dL    Alk. phosphatase 116 48 - 121 IU/L    AST (SGOT) 13 0 - 40 IU/L    ALT (SGPT) 10 0 -  32 IU/L   TSH 3RD GENERATION    Collection Time: 08/28/19  9:19 AM   Result Value Ref Range    TSH 1.110 0.450 - 4.500 uIU/mL   AMB POC COMPLETE CBC,AUTOMATED ENTER    Collection Time: 08/28/19  9:21 AM   Result Value Ref Range    WBC (POC) 7.0 4.1 - 10.9 10^3/ul    ABS. LYMPHS (POC) 1.6 0.6 - 4.1 10^3/ul    Mid # (POC) 0.7 0.0 - 1.8 10^3/ul    ABS. GRANS (POC) 4.7 2.0 - 7.8 10^3/ul    LYMPHOCYTES (POC) 23.2 10.0 - 58.5 %    MID% POC 9.7 0.1 - 24.0 %    GRANULOCYTES (POC) 67.1 37.0 - 92.0 %    RBC (POC) 3.59 (A) 4.20 - 6.30  10^6/ul    HGB (POC) 10.3 (A) 12.0 - 18.0 g/dL    HCT (POC) 32.7 (A) 37.0 - 51.0 %    MCV (POC) 91.0 80.0 - 97.0 fL    MCH (POC) 28.7 26.0 - 32.0 pg    MCHC (POC) 31.5 31.0 - 36.0 g/dL    RDW (POC) 16.2 (A) 11.5 - 14.5 %    PLATELET (POC) 341 140 - 440 10^3/ul    MPV (POC) 7.9 0.0 - 49.9 fL     Lab Results   Component Value Date/Time    Cholesterol, total 132 04/02/2019 04:07 PM    HDL Cholesterol 42 04/02/2019 04:07 PM    LDL, calculated 62 04/02/2019 04:07 PM    VLDL, calculated 28 04/02/2019 04:07 PM    Triglyceride 80 08/03/2019 02:03 PM         ASSESSMENT and PLAN        1. Irregular heart rate  The current medical regimen is effective;  continue present plan and Eliquis 5 mg, metoprolol 50 mg, diltiazem 30 mg for HR >100.    2. Paroxysmal atrial fibrillation (HCC)  The current medical regimen is effective;  continue present plan and Eliquis 5 mg, metoprolol 50 mg, diltiazem 30 mg for HR >100.    3. Dyslipidemia  The current medical regimen is effective;  continue present plan and simvastatin 20 mg.  Patient is okay to be off aspirin 81 current guidelines there is no role for aspirin in the management of A. fib      Orders Placed This Encounter   ??? ferrous sulfate (Iron) 325 mg (65 mg iron) tablet     Sig: Take  by mouth Daily (before breakfast).       Pt is instructed to follow all appropriate cardiac risk factor recommendations and to be compliant with meds and testing. Instructed to  follow up appropriately and seek urgent medical care if acute or unstable issues arise. Results of some tests may be viewed thru [facility] but this does not substitute for follow up with MD. If follow up is not scheduled pt is instructed to call for follow up      Follow-up and Dispositions    ?? Return in about 6 months (around 03/20/2020).               Jens Som, MD  09/21/2019  9:13 AM

## 2019-09-21 NOTE — Progress Notes (Signed)
 [facility]  [address], [address]  [place], [place] [address]  PHONE: [phone]    SUBJECTIVE: Brandy Vargas (10-Jan-1940) is a 79 y.o. female seen for a follow up visit regarding the following:        ICD-10-CM ICD-9-CM   1. Irregular heart rate  I49.9 427.9   2. Paroxysmal atrial fibrillation (HCC)  I48.0 427.31   3. Dyslipidemia  E78.5 272.4     Patient had left hip replacement, and readmission for hyponatremia. Doing well since, finished rehab. No chest pain. No palpitations. Patient denies syncope. No dyspnea. States they are taking meds. Maintains a normal level of activity for them without symptoms. No dizziness or lightheadedness. All above conditions stable.    Sees PCP regularly, last lipid panel 04/2019, LDL and TC wnl. Only taken diltiazem  30 mg for HR >100 twice in past year. Taking Lasix  40 mg with potassium supplement for LE swelling.     Past Medical History, Past Surgical History, Family history, Social History, and Medications were all reviewed with the patient today and updated as necessary.    Outpatient Medications Marked as Taking for the 09/21/19 encounter (Office Visit) with Bittrick, Thom HERO, MD   Medication Sig Dispense Refill   . ferrous sulfate (Iron) 325 mg (65 mg iron) tablet Take  by mouth Daily (before breakfast).     . solifenacin  (VESICARE ) 10 mg tablet Take 1 tablet by mouth once daily 30 Tablet 0   . simvastatin  (ZOCOR ) 20 mg tablet Take 1 tablet by mouth once daily 30 Tablet 0   . furosemide  (Lasix ) 40 mg tablet Take 1 Tablet by mouth daily. 30 Tablet 1   . potassium chloride  (KLOR-CON ) 10 mEq tablet Take 1 Tablet by mouth daily. 30 Tablet 1   . aspirin 81 mg chewable tablet Take 1 Tablet by mouth daily. MAY DECREASE ASPIRIN DOSAGE TO ONCE DAILY ONCE RESTART ON ELIQUIS . 30 Tablet 2   . telmisartan  (MICARDIS ) 80 mg tablet Take 1 tablet by mouth once daily 90 Tablet 0   . ondansetron hcl (ZOFRAN) 8 mg tablet Take 1 Tablet by mouth every eight (8) hours as needed  for Nausea or Vomiting. Indications: prevent nausea and vomiting after surgery 30 Tablet 0   . Eliquis  5 mg tablet Take 1 Tablet by mouth two (2) times a day. MAY DECREASE ASPIRIN DOSAGE TO ONCE DAILY  AND RESTART ELIQUIS  ON Tuesday, AUGUST 3RD, IF NO WOUND DRAINAGE. 1 Tablet 0   . calcium  carbonate (OS-CAL) 500 mg calcium  (1,250 mg) tablet Take 1 Tablet by mouth daily.     . cholecalciferol (VITAMIN D3) (2,000 UNITS /50 MCG) cap capsule Take 2,000 Units by mouth daily.     . metoprolol  tartrate (LOPRESSOR ) 50 mg tablet TAKE 1 TABLET BY MOUTH TWICE DAILY 180 Tablet 3   . dilTIAZem  IR (CARDIZEM ) 30 mg tablet Take 1 tablet po prn q 4h for afib. Heart rate >100       Allergies   Allergen Reactions   . Omeprazole Magnesium Itching     Past Medical History:   Diagnosis Date   . Arthritis    . Atrial fibrillation (HCC)    . Breast cancer (HCC) 2015    right    . COVID-19 vaccine series completed 04/08/2019    Moderna   . GERD (gastroesophageal reflux disease)    . Hypertension    . Overactive bladder      Past Surgical History:   Procedure Laterality  Date   . HX APPENDECTOMY     . HX BREAST LUMPECTOMY Right     2015   . HX KNEE ARTHROSCOPY     . HX TUBAL LIGATION  1976   . PR BREAST SURGERY PROCEDURE UNLISTED Right 2015    cancer     Family History   Problem Relation Age of Onset   . Hypertension Mother    . High Cholesterol Mother    . Heart Attack Father    . Diabetes Sister    . Diabetes Paternal Grandmother    . Breast Cancer Maternal Aunt       Social History     Tobacco Use   . Smoking status: Never Smoker   . Smokeless tobacco: Never Used   Substance Use Topics   . Alcohol use: Never     @JBFAM @    Current Outpatient Medications:   .  ferrous sulfate (Iron) 325 mg (65 mg iron) tablet, Take  by mouth Daily (before breakfast)., Disp: , Rfl:   .  solifenacin  (VESICARE ) 10 mg tablet, Take 1 tablet by mouth once daily, Disp: 30 Tablet, Rfl: 0  .  simvastatin  (ZOCOR ) 20 mg tablet, Take 1 tablet by mouth once daily, Disp:  30 Tablet, Rfl: 0  .  furosemide  (Lasix ) 40 mg tablet, Take 1 Tablet by mouth daily., Disp: 30 Tablet, Rfl: 1  .  potassium chloride  (KLOR-CON ) 10 mEq tablet, Take 1 Tablet by mouth daily., Disp: 30 Tablet, Rfl: 1  .  aspirin 81 mg chewable tablet, Take 1 Tablet by mouth daily. MAY DECREASE ASPIRIN DOSAGE TO ONCE DAILY ONCE RESTART ON ELIQUIS ., Disp: 30 Tablet, Rfl: 2  .  telmisartan  (MICARDIS ) 80 mg tablet, Take 1 tablet by mouth once daily, Disp: 90 Tablet, Rfl: 0  .  ondansetron hcl (ZOFRAN) 8 mg tablet, Take 1 Tablet by mouth every eight (8) hours as needed for Nausea or Vomiting. Indications: prevent nausea and vomiting after surgery, Disp: 30 Tablet, Rfl: 0  .  Eliquis  5 mg tablet, Take 1 Tablet by mouth two (2) times a day. MAY DECREASE ASPIRIN DOSAGE TO ONCE DAILY  AND RESTART ELIQUIS  ON Tuesday, AUGUST 3RD, IF NO WOUND DRAINAGE., Disp: 1 Tablet, Rfl: 0  .  calcium  carbonate (OS-CAL) 500 mg calcium  (1,250 mg) tablet, Take 1 Tablet by mouth daily., Disp: , Rfl:   .  cholecalciferol (VITAMIN D3) (2,000 UNITS /50 MCG) cap capsule, Take 2,000 Units by mouth daily., Disp: , Rfl:   .  metoprolol  tartrate (LOPRESSOR ) 50 mg tablet, TAKE 1 TABLET BY MOUTH TWICE DAILY, Disp: 180 Tablet, Rfl: 3  .  dilTIAZem  IR (CARDIZEM ) 30 mg tablet, Take 1 tablet po prn q 4h for afib. Heart rate >100, Disp: , Rfl:         ROS:  Review of Systems - General ROS: negative for - chills, fatigue or fever  Hematological and Lymphatic ROS: negative for - bleeding problems, bruising or jaundice  Respiratory ROS: no cough, shortness of breath, or wheezing  Cardiovascular ROS: no chest pain or dyspnea on exertion  Gastrointestinal ROS: no abdominal pain, change in bowel habits, or black or bloody stools  Neurological ROS: no TIA or stroke symptoms  All other systems negative.      Wt Readings from Last 3 Encounters:   09/21/19 177 lb 9.6 oz (80.6 kg)   08/28/19 183 lb (83 kg)   08/12/19 186 lb 9.6 oz (84.6 kg)     Temp Readings from  Last 3  Encounters:   08/12/19 98.3 F (36.8 C)   08/01/19 97.6 F (36.4 C)     BP Readings from Last 3 Encounters:   09/21/19 126/60   08/28/19 124/60   08/12/19 (!) 112/52     Pulse Readings from Last 3 Encounters:   09/21/19 (!) 58   08/12/19 70   08/01/19 79           PHYSICAL EXAM:  Visit Vitals  BP 126/60   Pulse (!) 58   Ht 5' 3 (1.6 m)   Wt 177 lb 9.6 oz (80.6 kg)   BMI 31.46 kg/m       Physical Examination: General appearance - alert, well appearing, and in no distress  Mental status - alert, oriented to person, place, and time  Eyes - pupils equal and reactive, extraocular eye movements intact  Neck/lymph - supple, no significant adenopathy  Chest/lungs - clear to auscultation, no wheezes, rales or rhonchi, symmetric air entry  Heart/CV - normal rate, regular rhythm, normal S1, S2, no murmurs, rubs, clicks or gallops  Abdomen/GI - soft, nontender, nondistended, no masses or organomegaly  Musculoskeletal - no joint tenderness, deformity or swelling  Extremities - peripheral pulses normal, pedal edema 1+ b/l, no clubbing or cyanosis  Skin - normal coloration and turgor, no rashes, no suspicious skin lesions noted                      Medications reviewed and questions answered    Recent Results (from the past 672 hour(s))   METABOLIC PANEL, COMPREHENSIVE    Collection Time: 08/28/19  9:19 AM   Result Value Ref Range    Glucose 96 65 - 99 mg/dL    BUN 17 8 - 27 mg/dL    Creatinine 9.15 9.42 - 1.00 mg/dL    GFR est non-AA 66 >40 mL/min/1.73    GFR est AA 76 >59 mL/min/1.73    BUN/Creatinine ratio 20 12 - 28    Sodium 136 134 - 144 mmol/L    Potassium 4.6 3.5 - 5.2 mmol/L    Chloride 97 96 - 106 mmol/L    CO2 27 20 - 29 mmol/L    Calcium  9.0 8.7 - 10.3 mg/dL    Protein, total 6.0 6.0 - 8.5 g/dL    Albumin 3.7 3.7 - 4.7 g/dL    GLOBULIN, TOTAL 2.3 1.5 - 4.5 g/dL    A-G Ratio 1.6 1.2 - 2.2    Bilirubin, total 0.5 0.0 - 1.2 mg/dL    Alk. phosphatase 116 48 - 121 IU/L    AST (SGOT) 13 0 - 40 IU/L    ALT (SGPT) 10 0 -  32 IU/L   TSH 3RD GENERATION    Collection Time: 08/28/19  9:19 AM   Result Value Ref Range    TSH 1.110 0.450 - 4.500 uIU/mL   AMB POC COMPLETE CBC,AUTOMATED ENTER    Collection Time: 08/28/19  9:21 AM   Result Value Ref Range    WBC (POC) 7.0 4.1 - 10.9 10^3/ul    ABS. LYMPHS (POC) 1.6 0.6 - 4.1 10^3/ul    Mid # (POC) 0.7 0.0 - 1.8 10^3/ul    ABS. GRANS (POC) 4.7 2.0 - 7.8 10^3/ul    LYMPHOCYTES (POC) 23.2 10.0 - 58.5 %    MID% POC 9.7 0.1 - 24.0 %    GRANULOCYTES (POC) 67.1 37.0 - 92.0 %    RBC (POC) 3.59 (A) 4.20 - 6.30  10^6/ul    HGB (POC) 10.3 (A) 12.0 - 18.0 g/dL    HCT (POC) 67.2 (A) 62.9 - 51.0 %    MCV (POC) 91.0 80.0 - 97.0 fL    MCH (POC) 28.7 26.0 - 32.0 pg    MCHC (POC) 31.5 31.0 - 36.0 g/dL    RDW (POC) 83.7 (A) 88.4 - 14.5 %    PLATELET (POC) 341 140 - 440 10^3/ul    MPV (POC) 7.9 0.0 - 49.9 fL     Lab Results   Component Value Date/Time    Cholesterol, total 132 04/02/2019 04:07 PM    HDL Cholesterol 42 04/02/2019 04:07 PM    LDL, calculated 62 04/02/2019 04:07 PM    VLDL, calculated 28 04/02/2019 04:07 PM    Triglyceride 80 08/03/2019 02:03 PM         ASSESSMENT and PLAN        1. Irregular heart rate  The current medical regimen is effective;  continue present plan and Eliquis  5 mg, metoprolol  50 mg, diltiazem  30 mg for HR >100.    2. Paroxysmal atrial fibrillation (HCC)  The current medical regimen is effective;  continue present plan and Eliquis  5 mg, metoprolol  50 mg, diltiazem  30 mg for HR >100.    3. Dyslipidemia  The current medical regimen is effective;  continue present plan and simvastatin  20 mg.  Patient is okay to be off aspirin 81 current guidelines there is no role for aspirin in the management of A. fib      Orders Placed This Encounter   . ferrous sulfate (Iron) 325 mg (65 mg iron) tablet     Sig: Take  by mouth Daily (before breakfast).       Pt is instructed to follow all appropriate cardiac risk factor recommendations and to be compliant with meds and testing. Instructed to  follow up appropriately and seek urgent medical care if acute or unstable issues arise. Results of some tests may be viewed thru MyChart but this does not substitute for follow up with MD. If follow up is not scheduled pt is instructed to call for follow up                Chiquita Clayman  09/21/2019  9:13 AM

## 2019-09-25 ENCOUNTER — Telehealth: Attending: Family Medicine | Primary: Family Medicine

## 2019-09-25 ENCOUNTER — Telehealth: Admit: 2019-09-25 | Discharge: 2019-09-25 | Payer: MEDICARE | Attending: Family Medicine | Primary: Family Medicine

## 2019-09-25 DIAGNOSIS — E871 Hypo-osmolality and hyponatremia: Secondary | ICD-10-CM

## 2019-09-25 MED ORDER — FERROUS SULFATE 325 MG (65 MG ELEMENTAL IRON) TAB
325 mg (65 mg iron) | ORAL_TABLET | Freq: Every day | ORAL | 3 refills | Status: DC
Start: 2019-09-25 — End: 2020-03-08

## 2019-09-25 NOTE — Progress Notes (Signed)
SUBJECTIVE:   Brandy Vargas is a 79 y.o. female who has a past medical history significant for hypertension, paroxysmal atrial fibrillation, high cholesterol and degenerative arthritis.  Patient presents today for virtual visit via telephone encounter.  At previous visit the patient was seen in hospital follow-up from an admission for a syncopal spell felt secondary to hyponatremia.  Please refer to prior notes for details.  Patient had lab work at that visit revealing normalization of sodium at 136.  She was experiencing worsening lower extremity edema.  Lasix and potassium were started.  She has tolerated this well.  She saw her cardiologist 3 days ago and her weight has been down roughly 6 pounds since starting the Lasix and potassium.  She is feeling much better.  In addition she was noted to have a postoperative anemia.  Please refer to prior notes for details.  She has been taking iron supplementation and states that she is feeling much better since doing so.    Patient's vital signs were not performed as encounter was performed virtually.  Location of virtual visit was patient's home.      HPI  See above    Past Medical History, Past Surgical History, Family history, Social History, and Medications were all reviewed with the patient today and updated as necessary.       Current Outpatient Medications   Medication Sig Dispense Refill   ??? ferrous sulfate (Iron) 325 mg (65 mg iron) tablet Take  by mouth Daily (before breakfast).     ??? solifenacin (VESICARE) 10 mg tablet Take 1 tablet by mouth once daily 30 Tablet 0   ??? simvastatin (ZOCOR) 20 mg tablet Take 1 tablet by mouth once daily 30 Tablet 0   ??? furosemide (Lasix) 40 mg tablet Take 1 Tablet by mouth daily. 30 Tablet 1   ??? potassium chloride (KLOR-CON) 10 mEq tablet Take 1 Tablet by mouth daily. 30 Tablet 1   ??? aspirin 81 mg chewable tablet Take 1 Tablet by mouth daily. MAY DECREASE ASPIRIN DOSAGE TO ONCE DAILY ONCE RESTART ON ELIQUIS. 30 Tablet 2   ???  telmisartan (MICARDIS) 80 mg tablet Take 1 tablet by mouth once daily 90 Tablet 0   ??? Eliquis 5 mg tablet Take 1 Tablet by mouth two (2) times a day. MAY DECREASE ASPIRIN DOSAGE TO ONCE DAILY  AND RESTART ELIQUIS ON Tuesday, AUGUST 3RD, IF NO WOUND DRAINAGE. 1 Tablet 0   ??? calcium carbonate (OS-CAL) 500 mg calcium (1,250 mg) tablet Take 1 Tablet by mouth daily.     ??? cholecalciferol (VITAMIN D3) (2,000 UNITS /50 MCG) cap capsule Take 2,000 Units by mouth daily.     ??? metoprolol tartrate (LOPRESSOR) 50 mg tablet TAKE 1 TABLET BY MOUTH TWICE DAILY 180 Tablet 3   ??? dilTIAZem IR (CARDIZEM) 30 mg tablet Take 1 tablet po prn q 4h for afib. Heart rate >100       Allergies   Allergen Reactions   ??? Omeprazole Magnesium Itching     Patient Active Problem List   Diagnosis Code   ??? 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 Vitamin D deficiency E55.9   ??? Paroxysmal atrial fibrillation (HCC) I48.0   ??? Essential hypertension I10   ??? 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 Leukocytosis D72.829     Past Medical History:   Diagnosis Date   ??? Arthritis    ???  Atrial fibrillation (HCC)    ??? Breast cancer (HCC) 2015    right    ??? COVID-19 vaccine series completed 04/08/2019    Moderna   ??? GERD (gastroesophageal reflux disease)    ??? Hypertension    ??? Overactive bladder      Past Surgical History:   Procedure Laterality Date   ??? HX APPENDECTOMY     ??? HX BREAST LUMPECTOMY Right     2015   ??? HX KNEE ARTHROSCOPY     ??? HX TUBAL LIGATION  1976   ??? PR BREAST SURGERY PROCEDURE UNLISTED Right 2015    cancer     Family History   Problem Relation Age of Onset   ??? Hypertension Mother    ??? High Cholesterol Mother    ??? Heart Attack Father    ??? Diabetes Sister    ??? Diabetes Paternal Grandmother    ??? Breast Cancer Maternal Aunt      Social History     Tobacco Use   ??? Smoking status: Never Smoker   ??? Smokeless tobacco: Never Used   Substance Use Topics   ??? Alcohol use: Never         Review of  Systems  See above    OBJECTIVE:  There were no vitals taken for this visit.     Physical Exam    Medical problems and test results were reviewed with the patient today.         ASSESSMENT and PLAN    1.  Hyponatremia.  Appears to have resolved.  We will recheck a metabolic panel next week.  Most recent sodium was 136.    2.  Lower extremity edema.  Continue Lasix and potassium until patient can be seen in the office for recheck physically as well as a blood pressure check.  Rechecking electrolytes including potassium.    3.  Postoperative anemia.  Recheck CBC.  Last hemoglobin was 10.3.  Continue iron supplementation.    4.  Hypertension.  Patient reports blood pressure readings at home have been excellent.  Recheck at follow-up.          Consent:  This patient and/or their healthcare decision maker is aware that this patient-initiated Telehealth encounter is a billable service, with coverage as determined by their insurance carrier. Patient is aware that they may receive a bill and has provided verbal consent to proceed: Yes    I was in the office while conducting this encounter.      This virtual visit was conducted telephone encounter only. -  I affirm this is a Patient Initiated Episode with an Established Patient who has not had a related appointment within my department in the past 7 days or scheduled within the next 24 hours.  Note: this encounter is not billable if this call serves to triage the patient into an appointment for the relevant concern.      Total Time: minutes: 21-30 minutes.    Elements of this note have been dictated using speech recognition software. As a result, errors of speech recognition may have occurred.

## 2019-09-29 ENCOUNTER — Other Ambulatory Visit: Admit: 2019-09-29 | Discharge: 2019-09-29 | Payer: MEDICARE | Primary: Family Medicine

## 2019-09-29 DIAGNOSIS — D649 Anemia, unspecified: Secondary | ICD-10-CM

## 2019-09-29 LAB — AMB POC COMPLETE CBC,AUTOMATED ENTER
ABS. GRANS (POC): 4.7 10*3/uL (ref 2.0–7.8)
ABS. LYMPHS (POC): 2.6 10*3/uL (ref 0.6–4.1)
GRANULOCYTES (POC): 59.3 % (ref 37.0–92.0)
Granulocytes %, POC: 59.3 % (ref 37.0–92.0)
Granulocytes Abs: 4.7 10*3/uL (ref 2.0–7.8)
HCT (POC): 39.7 % (ref 37.0–51.0)
HGB (POC): 12.7 g/dL (ref 12.0–18.0)
Hematocrit, POC: 39.7 % (ref 37.0–51.0)
Hemoglobin, POC: 12.7 g/dL (ref 12.0–18.0)
LYMPHOCYTES (POC): 33.1 % (ref 10.0–58.5)
Lymphocyte %: 33.1 % (ref 10.0–58.5)
Lymphs Abs: 2.6 10*3/uL (ref 0.6–4.1)
MCH (POC): 27.7 pg (ref 26.0–32.0)
MCH: 27.7 pg (ref 26.0–32.0)
MCHC (POC): 32 g/dL (ref 31.0–36.0)
MCHC: 32 g/dL (ref 31.0–36.0)
MCV (POC): 86.5 fL (ref 80.0–97.0)
MCV: 86.5 fL (ref 80.0–97.0)
MID% POC: 7.6 % (ref 0.1–24.0)
MPV (POC): 8.1 fL (ref 0.0–49.9)
MPV POC: 8.1 fL (ref 0.0–49.9)
Mid # (POC): 0.6 10*3/uL (ref 0.0–1.8)
Mid Cells %, POC: 7.6 % (ref 0.1–24.0)
Mid Cells Absoulute POC: 0.6 10*3/uL (ref 0.0–1.8)
PLATELET (POC): 237 10*3/uL (ref 140–440)
Platelet Count, POC: 237 10*3/uL (ref 140–440)
RBC (POC): 4.59 10*6/uL (ref 4.20–6.30)
RBC, POC: 4.59 10*6/uL (ref 4.20–6.30)
RDW (POC): 14.5 % (ref 11.5–14.5)
RDW, POC: 14.5 % (ref 11.5–14.5)
WBC (POC): 7.9 10*3/uL (ref 4.1–10.9)
WBC, POC: 7.9 10*3/uL (ref 4.1–10.9)

## 2019-09-29 NOTE — Progress Notes (Signed)
Brandy Vargas is a 79 y.o. female had labs drawn today for labs per provider at [address] [place] [address].  The patient was drawn at  835am. In the   left arm and the patient tolerated the procedure fine with no issues.       Shary Key

## 2019-09-30 LAB — COMPREHENSIVE METABOLIC PANEL
ALT: 14 IU/L (ref 0–32)
AST: 16 IU/L (ref 0–40)
Albumin/Globulin Ratio: 1.7 NA (ref 1.2–2.2)
Albumin: 3.9 g/dL (ref 3.7–4.7)
Alkaline Phosphatase: 115 IU/L (ref 44–121)
BUN: 21 mg/dL (ref 8–27)
Bun/Cre Ratio: 25 NA (ref 12–28)
CO2: 26 mmol/L (ref 20–29)
Calcium: 9.2 mg/dL (ref 8.7–10.3)
Chloride: 100 mmol/L (ref 96–106)
Creatinine: 0.83 mg/dL (ref 0.57–1.00)
EGFR IF NonAfrican American: 67 mL/min/{1.73_m2} (ref >59)
GFR African American: 77 mL/min/{1.73_m2} (ref >59)
Globulin, Total: 2.3 g/dL (ref 1.5–4.5)
Glucose: 94 mg/dL (ref 65–99)
Potassium: 4.3 mmol/L (ref 3.5–5.2)
Sodium: 139 mmol/L (ref 134–144)
Total Bilirubin: 0.3 mg/dL (ref 0.0–1.2)
Total Protein: 6.2 g/dL (ref 6.0–8.5)

## 2019-09-30 LAB — LIPID PANEL
Cholesterol, Total: 143 mg/dL (ref 100–199)
Cholesterol, total: 143 mg/dL (ref 100–199)
HDL Cholesterol: 44 mg/dL (ref >39)
HDL: 44 mg/dL (ref >39)
LDL Calculated: 78 mg/dL (ref 0–99)
LDL, calculated: 78 mg/dL (ref 0–99)
Triglyceride: 118 mg/dL (ref 0–149)
Triglycerides: 118 mg/dL (ref 0–149)
VLDL, calculated: 21 mg/dL (ref 5–40)
VLDL: 21 mg/dL (ref 5–40)

## 2019-09-30 LAB — VITAMIN D 25 HYDROXY: Vit D, 25-Hydroxy: 38.3 ng/mL (ref 30.0–100.0)

## 2019-09-30 LAB — METABOLIC PANEL, COMPREHENSIVE
A-G Ratio: 1.7 (ref 1.2–2.2)
ALT (SGPT): 14 IU/L (ref 0–32)
AST (SGOT): 16 IU/L (ref 0–40)
Albumin: 3.9 g/dL (ref 3.7–4.7)
Alk. phosphatase: 115 IU/L (ref 44–121)
BUN/Creatinine ratio: 25 (ref 12–28)
BUN: 21 mg/dL (ref 8–27)
Bilirubin, total: 0.3 mg/dL (ref 0.0–1.2)
CO2: 26 mmol/L (ref 20–29)
Calcium: 9.2 mg/dL (ref 8.7–10.3)
Chloride: 100 mmol/L (ref 96–106)
Creatinine: 0.83 mg/dL (ref 0.57–1.00)
GFR est AA: 77 mL/min/{1.73_m2} (ref >59)
GFR est non-AA: 67 mL/min/{1.73_m2} (ref >59)
GLOBULIN, TOTAL: 2.3 g/dL (ref 1.5–4.5)
Glucose: 94 mg/dL (ref 65–99)
Potassium: 4.3 mmol/L (ref 3.5–5.2)
Protein, total: 6.2 g/dL (ref 6.0–8.5)
Sodium: 139 mmol/L (ref 134–144)

## 2019-09-30 LAB — VITAMIN D, 25 HYDROXY: VITAMIN D, 25-HYDROXY: 38.3 ng/mL (ref 30.0–100.0)

## 2019-10-02 MED ORDER — SIMVASTATIN 20 MG TAB
20 mg | ORAL_TABLET | ORAL | 0 refills | Status: DC
Start: 2019-10-02 — End: 2019-11-02

## 2019-10-16 MED ORDER — SOLIFENACIN 10 MG TAB
10 mg | ORAL_TABLET | ORAL | 0 refills | Status: DC
Start: 2019-10-16 — End: 2019-11-20

## 2019-10-27 ENCOUNTER — Ambulatory Visit: Attending: Family Medicine | Primary: Family Medicine

## 2019-10-27 ENCOUNTER — Ambulatory Visit: Admit: 2019-10-27 | Discharge: 2019-10-27 | Payer: MEDICARE | Attending: Family Medicine | Primary: Family Medicine

## 2019-10-27 DIAGNOSIS — D500 Iron deficiency anemia secondary to blood loss (chronic): Secondary | ICD-10-CM

## 2019-10-27 NOTE — Progress Notes (Signed)
SUBJECTIVE:   Brandy Vargas is a 79 y.o. female who has a past medical history significant for hypertension, postoperative blood loss anemia status post left hip replacement, hyponatremia, lower extremity edema, paroxysmal atrial fibrillation and high cholesterol.  Current medicines are listed in the EMR and reviewed today.  Review of systems reveals no complaints of chest pain, shortness of breath, orthopnea or PND.  GI and GU review of systems is unremarkable.    HPI  See above    Past Medical History, Past Surgical History, Family history, Social History, and Medications were all reviewed with the patient today and updated as necessary.       Current Outpatient Medications   Medication Sig Dispense Refill   ??? solifenacin (VESICARE) 10 mg tablet Take 1 tablet by mouth once daily 30 Tablet 0   ??? simvastatin (ZOCOR) 20 mg tablet Take 1 tablet by mouth once daily 30 Tablet 0   ??? ferrous sulfate (Iron) 325 mg (65 mg iron) tablet Take 1 Tablet by mouth Daily (before breakfast). 30 Tablet 3   ??? furosemide (Lasix) 40 mg tablet Take 1 Tablet by mouth daily. 30 Tablet 1   ??? potassium chloride (KLOR-CON) 10 mEq tablet Take 1 Tablet by mouth daily. 30 Tablet 1   ??? telmisartan (MICARDIS) 80 mg tablet Take 1 tablet by mouth once daily 90 Tablet 0   ??? Eliquis 5 mg tablet Take 1 Tablet by mouth two (2) times a day. MAY DECREASE ASPIRIN DOSAGE TO ONCE DAILY  AND RESTART ELIQUIS ON Tuesday, AUGUST 3RD, IF NO WOUND DRAINAGE. 1 Tablet 0   ??? calcium carbonate (OS-CAL) 500 mg calcium (1,250 mg) tablet Take 1 Tablet by mouth daily.     ??? cholecalciferol (VITAMIN D3) (2,000 UNITS /50 MCG) cap capsule Take 2,000 Units by mouth daily.     ??? metoprolol tartrate (LOPRESSOR) 50 mg tablet TAKE 1 TABLET BY MOUTH TWICE DAILY 180 Tablet 3   ??? dilTIAZem IR (CARDIZEM) 30 mg tablet Take 1 tablet po prn q 4h for afib. Heart rate >100       Allergies   Allergen Reactions   ??? Omeprazole Magnesium Itching     Patient Active Problem List   Diagnosis  Code   ??? 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 Vitamin D deficiency E55.9   ??? Paroxysmal atrial fibrillation (HCC) I48.0   ??? Essential hypertension I10   ??? 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 Leukocytosis D72.829     Past Medical History:   Diagnosis Date   ??? Arthritis    ??? Atrial fibrillation (HCC)    ??? Breast cancer (HCC) 2015    right    ??? COVID-19 vaccine series completed 04/08/2019    Moderna   ??? GERD (gastroesophageal reflux disease)    ??? Hypertension    ??? Overactive bladder      Past Surgical History:   Procedure Laterality Date   ??? HX APPENDECTOMY     ??? HX BREAST LUMPECTOMY Right     2015   ??? HX KNEE ARTHROSCOPY     ??? HX TUBAL LIGATION  1976   ??? PR BREAST SURGERY PROCEDURE UNLISTED Right 2015    cancer     Family History   Problem Relation Age of Onset   ??? Hypertension Mother    ??? High Cholesterol Mother    ??? Heart Attack Father    ???  Diabetes Sister    ??? Diabetes Paternal Grandmother    ??? Breast Cancer Maternal Aunt      Social History     Tobacco Use   ??? Smoking status: Never Smoker   ??? Smokeless tobacco: Never Used   Substance Use Topics   ??? Alcohol use: Never         Review of Systems  See above    OBJECTIVE:  Visit Vitals  BP 112/62   Ht 5\' 3"  (1.6 m)   Wt 174 lb 12.8 oz (79.3 kg)   BMI 30.96 kg/m??        Physical Exam  Constitutional:       Appearance: She is well-developed.   HENT:      Head: Normocephalic and atraumatic.   Eyes:      Pupils: Pupils are equal, round, and reactive to light.   Neck:      Thyroid: No thyromegaly.      Vascular: No JVD.   Cardiovascular:      Rate and Rhythm: Normal rate and regular rhythm.      Heart sounds: Normal heart sounds. No murmur heard.   No friction rub. No gallop.    Pulmonary:      Effort: Pulmonary effort is normal. No respiratory distress.      Breath sounds: No wheezing or rales.   Abdominal:      Palpations: Abdomen is soft.      Tenderness: There is no abdominal tenderness. There is no  guarding or rebound.   Musculoskeletal:         General: Normal range of motion.      Cervical back: Normal range of motion and neck supple.   Skin:     Findings: No rash.   Neurological:      Mental Status: She is alert and oriented to person, place, and time.         Medical problems and test results were reviewed with the patient today.         ASSESSMENT and PLAN    1.  History of blood loss anemia.  Most recent hemoglobin has normalized to 12.7.  She may discontinue her iron supplementation.  Reevaluate at follow-up in 6 months.  Sooner if clinically warranted.    2.  Hypertension.  Blood pressure is 112/62.  Most recent renal function and electrolytes were normal.  Continue current therapy.    3.  Hyponatremia.  Most recent repeat sodium was 139.  Continue to monitor clinically.    4.  Lower extremity edema.  No complaints.  Continue current therapy.    5.  Paroxysmal atrial fibrillation.  Continue anticoagulation.  No syncope or presyncope is reported.    6.  High cholesterol.  Continue statin therapy.  Rechecking a scheduled follow-up.    Elements of this note have been dictated using speech recognition software. As a result, errors of speech recognition may have occurred.

## 2019-11-02 MED ORDER — SIMVASTATIN 20 MG TAB
20 mg | ORAL_TABLET | ORAL | 5 refills | Status: DC
Start: 2019-11-02 — End: 2020-05-08

## 2019-11-02 MED ORDER — POTASSIUM CHLORIDE SR 10 MEQ TAB, PARTICLES/CRYSTALS
10 mEq | ORAL_TABLET | ORAL | 5 refills | Status: AC
Start: 2019-11-02 — End: ?

## 2019-11-02 MED ORDER — FUROSEMIDE 40 MG TAB
40 mg | ORAL_TABLET | ORAL | 5 refills | Status: AC
Start: 2019-11-02 — End: ?

## 2019-11-20 MED ORDER — SOLIFENACIN 10 MG TAB
10 mg | ORAL_TABLET | ORAL | 0 refills | Status: DC
Start: 2019-11-20 — End: 2019-12-23

## 2019-11-30 MED ORDER — TELMISARTAN 80 MG TAB
80 mg | ORAL_TABLET | ORAL | 0 refills | Status: DC
Start: 2019-11-30 — End: 2020-03-01

## 2019-12-23 MED ORDER — SOLIFENACIN 10 MG TAB
10 mg | ORAL_TABLET | ORAL | 5 refills | Status: AC
Start: 2019-12-23 — End: ?

## 2020-01-27 ENCOUNTER — Encounter: Attending: Orthopaedic Surgery | Primary: Family Medicine

## 2020-02-24 ENCOUNTER — Ambulatory Visit: Attending: Orthopaedic Surgery | Primary: Family Medicine

## 2020-02-24 ENCOUNTER — Encounter: Admit: 2020-02-24 | Discharge: 2020-02-24 | Payer: MEDICARE | Primary: Family Medicine

## 2020-02-24 ENCOUNTER — Encounter

## 2020-02-24 ENCOUNTER — Ambulatory Visit: Admit: 2020-02-24 | Discharge: 2020-02-24 | Payer: MEDICARE | Attending: Orthopaedic Surgery | Primary: Family Medicine

## 2020-02-24 DIAGNOSIS — Z96642 Presence of left artificial hip joint: Secondary | ICD-10-CM

## 2020-02-24 DIAGNOSIS — Z96653 Presence of artificial knee joint, bilateral: Secondary | ICD-10-CM

## 2020-02-24 MED ORDER — METHYLPREDNISOLONE 40 MG/ML SUSP FOR INJECTION
40 mg/mL | Freq: Once | INTRAMUSCULAR | Status: AC
Start: 2020-02-24 — End: 2020-02-24
  Administered 2020-02-24: 15:00:00 via INTRA_ARTICULAR

## 2020-02-24 NOTE — Progress Notes (Signed)
Name: Brandy Vargas  Date of Birth: [birth year]  Gender: female  MRN: [id number]    CC:   Chief Complaint   Patient presents with   ??? Hip Pain     6 month PO Left THA        HPI:   The pain has been present for months and is becoming worse both her knees  She is 6 months post left hip arthroplasty and this is done well.  It hurts now with sleep at night.  The pain is located over the knee.  It does hurt to walk and gets worse with increased distances.   The pain does not radiate down the leg.  Numbness and tingling are not noted.   Treatment so far has been anti-inflammatory medications   and cortisone injections with Dr. Margy Clarks in the past.    As per HPI.  Pertinent positives and negatives are addressed with the patient, particularly those related to musculoskeletal concerns.  Non-orthopaedic concerns were referred back to the primary care physician.      PMFSH:    Current Outpatient Medications:   ???  solifenacin (VESICARE) 10 mg tablet, Take 1 tablet by mouth once daily, Disp: 30 Tablet, Rfl: 5  ???  telmisartan (MICARDIS) 80 mg tablet, Take 1 tablet by mouth once daily, Disp: 90 Tablet, Rfl: 0  ???  simvastatin (ZOCOR) 20 mg tablet, Take 1 tablet by mouth once daily, Disp: 30 Tablet, Rfl: 5  ???  furosemide (LASIX) 40 mg tablet, Take 1 tablet by mouth once daily, Disp: 30 Tablet, Rfl: 5  ???  potassium chloride (KLOR-CON) 10 mEq tablet, Take 1 tablet by mouth once daily, Disp: 30 Tablet, Rfl: 5  ???  ferrous sulfate (Iron) 325 mg (65 mg iron) tablet, Take 1 Tablet by mouth Daily (before breakfast)., Disp: 30 Tablet, Rfl: 3  ???  Eliquis 5 mg tablet, Take 1 Tablet by mouth two (2) times a day. MAY DECREASE ASPIRIN DOSAGE TO ONCE DAILY  AND RESTART ELIQUIS ON Tuesday, AUGUST 3RD, IF NO WOUND DRAINAGE., Disp: 1 Tablet, Rfl: 0  ???  calcium carbonate (OS-CAL) 500 mg calcium (1,250 mg) tablet, Take 1 Tablet by mouth daily., Disp: , Rfl:   ???  cholecalciferol (VITAMIN D3) (2,000 UNITS /50 MCG) cap capsule, Take 2,000 Units by mouth  daily., Disp: , Rfl:   ???  metoprolol tartrate (LOPRESSOR) 50 mg tablet, TAKE 1 TABLET BY MOUTH TWICE DAILY, Disp: 180 Tablet, Rfl: 3  ???  dilTIAZem IR (CARDIZEM) 30 mg tablet, Take 1 tablet po prn q 4h for afib. Heart rate >100, Disp: , Rfl:   Allergies   Allergen Reactions   ??? Omeprazole Magnesium Itching     Past Medical History:   Diagnosis Date   ??? Arthritis    ??? Atrial fibrillation (HCC)    ??? Breast cancer (HCC) 2015    right    ??? COVID-19 vaccine series completed 04/08/2019    Moderna   ??? GERD (gastroesophageal reflux disease)    ??? Hypertension    ??? Overactive bladder       Past Surgical History:   Procedure Laterality Date   ??? HX APPENDECTOMY     ??? HX BREAST LUMPECTOMY Right     2015   ??? HX KNEE ARTHROSCOPY     ??? HX TUBAL LIGATION  1976   ??? PR BREAST SURGERY PROCEDURE UNLISTED Right 2015    cancer     Family History   Problem Relation Age  of Onset   ??? Hypertension Mother    ??? High Cholesterol Mother    ??? Heart Attack Father    ??? Diabetes Sister    ??? Diabetes Paternal Grandmother    ??? Breast Cancer Maternal Aunt      Social History     Socioeconomic History   ??? Marital status: WIDOWED     Spouse name: Not on file   ??? Number of children: Not on file   ??? Years of education: Not on file   ??? Highest education level: Not on file   Occupational History   ??? Not on file   Tobacco Use   ??? Smoking status: Never Smoker   ??? Smokeless tobacco: Never Used   Vaping Use   ??? Vaping Use: Never used   Substance and Sexual Activity   ??? Alcohol use: Never   ??? Drug use: Never   ??? Sexual activity: Not on file   Other Topics Concern   ??? Not on file   Social History Narrative   ??? Not on file     Social Determinants of Health     Financial Resource Strain:    ??? Difficulty of Paying Living Expenses: Not on file   Food Insecurity:    ??? Worried About Running Out of Food in the Last Year: Not on file   ??? Ran Out of Food in the Last Year: Not on file   Transportation Needs:    ??? Lack of Transportation (Medical): Not on file   ??? Lack of  Transportation (Non-Medical): Not on file   Physical Activity:    ??? Days of Exercise per Week: Not on file   ??? Minutes of Exercise per Session: Not on file   Stress:    ??? Feeling of Stress : Not on file   Social Connections:    ??? Frequency of Communication with Friends and Family: Not on file   ??? Frequency of Social Gatherings with Friends and Family: Not on file   ??? Attends Religious Services: Not on file   ??? Active Member of Clubs or Organizations: Not on file   ??? Attends Banker Meetings: Not on file   ??? Marital Status: Not on file   Intimate Partner Violence:    ??? Fear of Current or Ex-Partner: Not on file   ??? Emotionally Abused: Not on file   ??? Physically Abused: Not on file   ??? Sexually Abused: Not on file   Housing Stability:    ??? Unable to Pay for Housing in the Last Year: Not on file   ??? Number of Places Lived in the Last Year: Not on file   ??? Unstable Housing in the Last Year: Not on file       PHYSICAL EXAMINATION:   The patient is alert and oriented, in no acute distress.  There were no vitals taken for this visit.      The lower extremities are as described below.  Circulation is normal with palpable pedal pulses bilaterally and no edema.  There is no lymph adenopathy in the popliteal or malleolar region.  Skin is without scarring about the knee bilaterally.  There is no stasis disease distally bilaterally.  Sensation is intact to light touch bilaterally.  The gait is noted to be with a slight trendelenburg and antalgia.  Range of motion is 0 to 90 degrees of flexion with 15 degrees internal and 25 degrees external rotation and some groin pain with  this.  Range of motion of the knee is 0-100 degrees on the right and 0-100 degrees on the left.  There is 15mm. of anterior/posterior translation and 58mm of medial/lateral instability bilaterally.  There is 5 degrees of varus alignment on the right and 5 degrees of varus alignment on the left.  Limb lengths are equal.  Straight leg test is  negative.  Quadriceps strength is good.  Their judgment and insight are normal.  They are oriented to time, place and person.  Their memory is good and the mood and affect appropriate.    XRAYS:   AP pelvis and lateral view of left hip are reviewed.  There is good bone prosthetic interface with  no suggestion of a progressive lucency.  X-ray impression:  Stable post-operative left total hip.         4 views of bilateral  knee are reviewed.  There is dramatic joint space loss, eburnated bone or osteophyte formation.  There is joint space narrowing present.  X-ray impression: She has bilateral  knee degenerative joint disease.     IMPRESSION:      ICD-10-CM ICD-9-CM   1. Status post total hip replacement, left  Z96.642 V43.64   2. Status post bilateral knee replacements  Z96.653 V43.65       RECOMMENDATION:           Reviewed x-ray findings with the patient.     {The concerns with functional limitations are increasing and response is variable with conservative measures.  bilateral knee replacement surgery was discussed today with the patient.  Its required hospitalization and inherent risks have been discussed.  We will additionally try injection therapies as we process management of this progressive and chronic condition.    TKA - Today we also discussed a knee replacement surgery.  They are aware of the 1% risk of infection.  They were also informed of the possibility of stroke, heart attack and blood clot; any of which could result in further hospitalization or death.  and Procedure Note: After alcohol prep, I injected 40mg  of Depo-Medrol and 12mL of 0.5% Marcaine into the bilateral knee.  Mindray ultrasound unit with a variable frequency (6.0-15.0 MHz) linear transducer was used to visualize the retropatellar fat pad, patella, patellar tendon, tibia, and to ensure proper intra-articular placement of medication. The injection image was stored in the patient's permanent chart.  The injection was without event and I  will follow them as scheduled.     Approximately 40 minutes was spent reviewing the medical record, imaging, performing history and physical examination, discussing the diagnosis and treatment plan with the patient, and completing documentation for this visit.  HP is a consideration.  Hopefully the injections will last her for a while and she will just return in 6 months for follow-up of her hip.

## 2020-03-01 MED ORDER — TELMISARTAN 80 MG TAB
80 mg | ORAL_TABLET | ORAL | 0 refills | Status: AC
Start: 2020-03-01 — End: ?

## 2020-03-06 IMAGING — MG DIGITAL DIAGNOSTIC BILATERAL MAMMOGRAM WITH TOMO AND CAD
9 series · 9 of 25 positions shown · non-contrast
Comparison: Previous exam(s).

CLINICAL DATA: 78-year-old female presenting for routine annual
surveillance status post right breast lumpectomy in 8633.

EXAM:
DIGITAL DIAGNOSTIC BILATERAL MAMMOGRAM WITH CAD AND TOMO

[R CC]
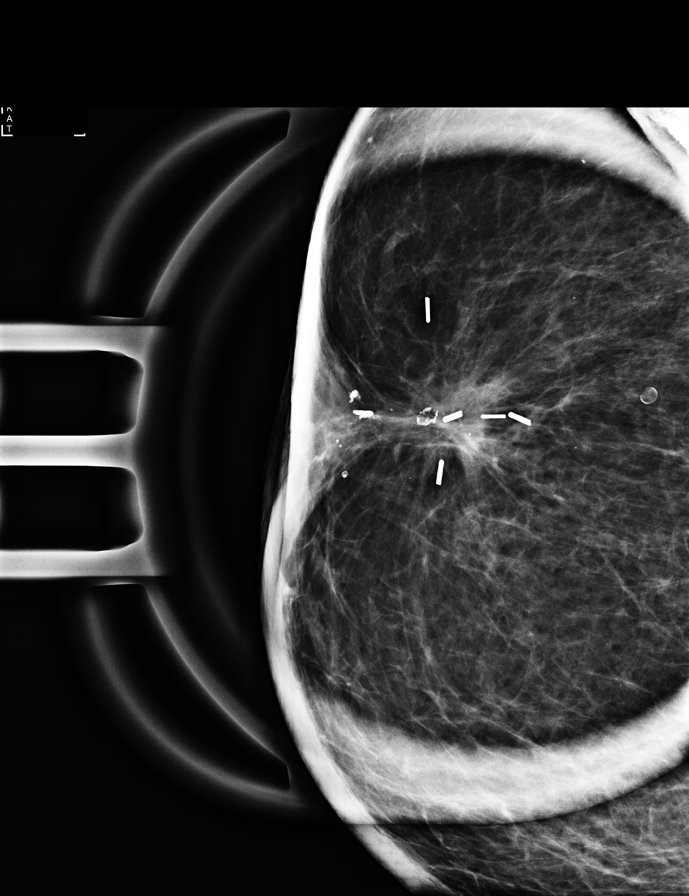

[L MLO synth-2D]
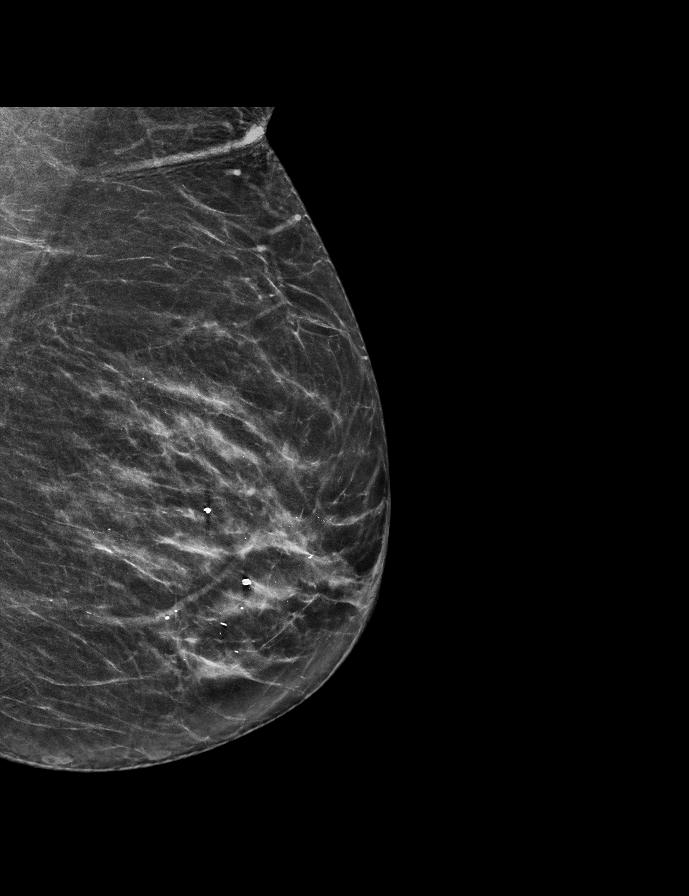

[R MLO synth-2D]
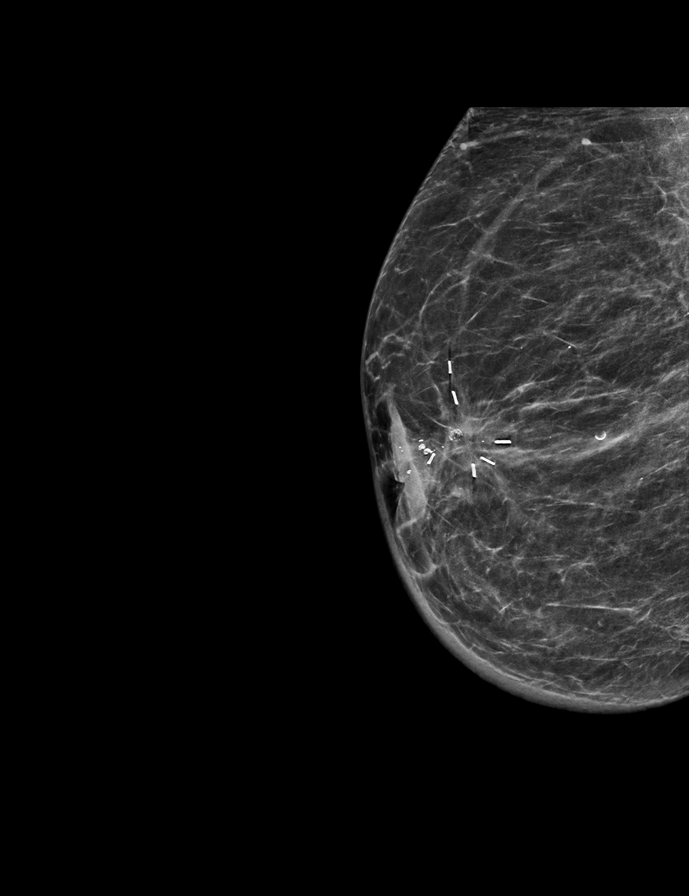

[R CC synth-2D]
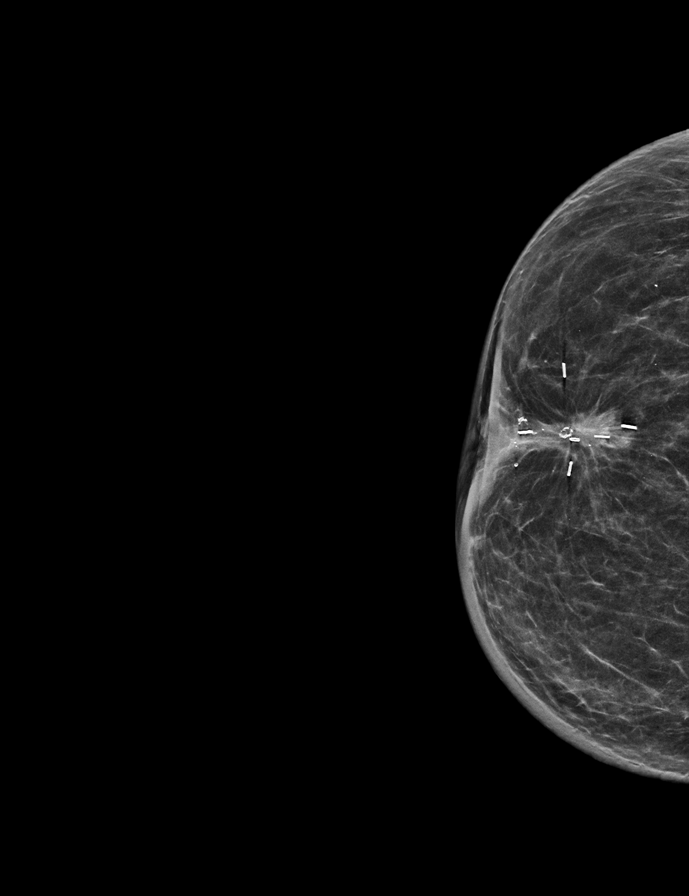

[L CC synth-2D]
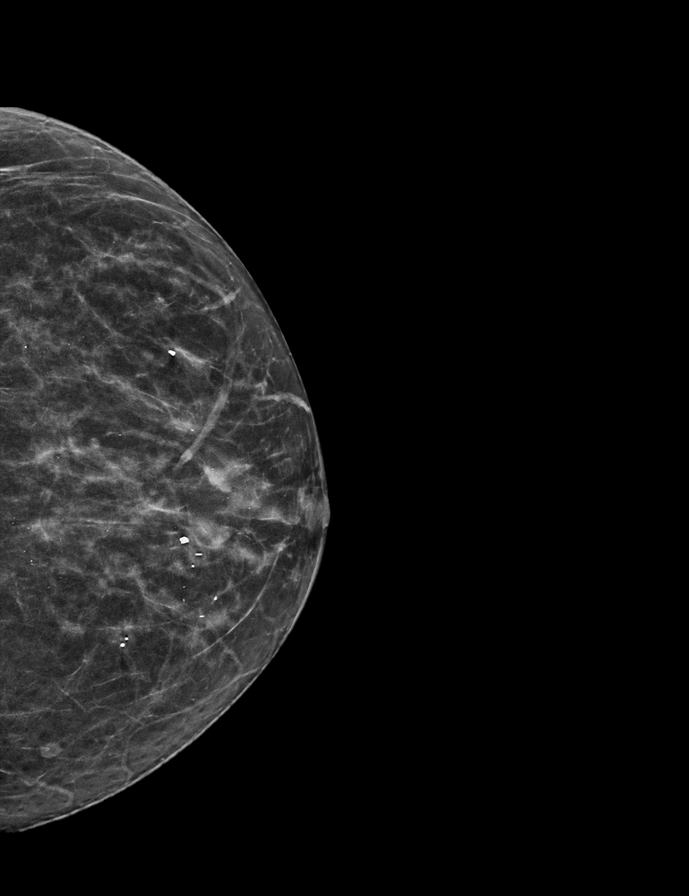

[R MLO tomo · tomo slice 32/63.0]
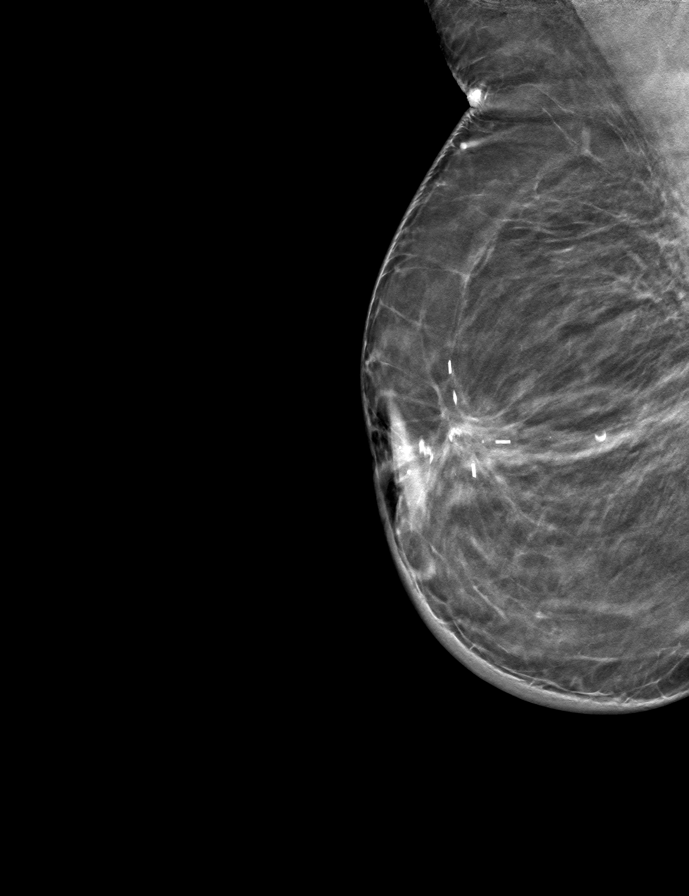

[L MLO tomo · tomo slice 31/60.0]
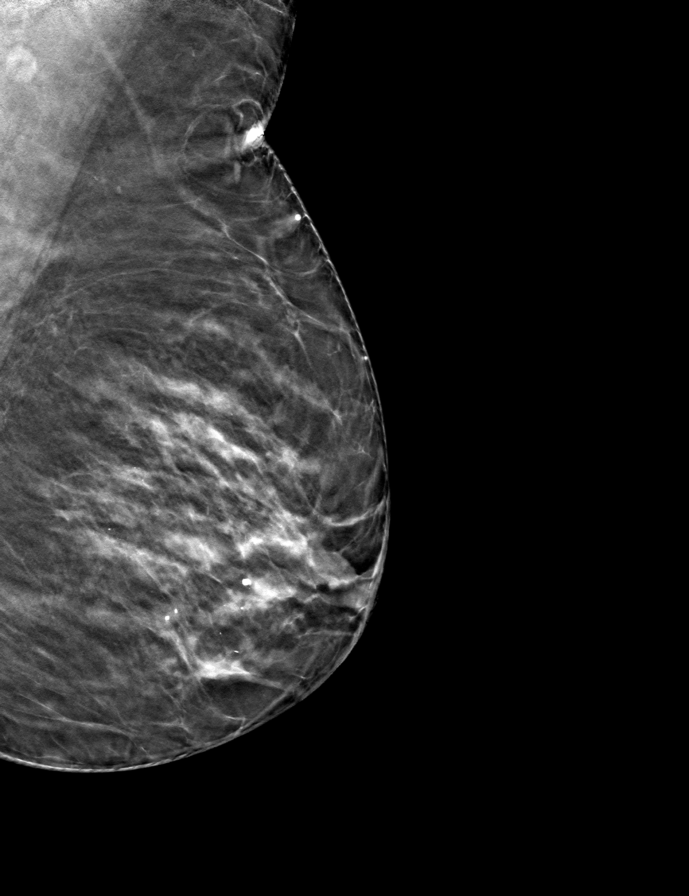

[R CC tomo · tomo slice 28/55.0]
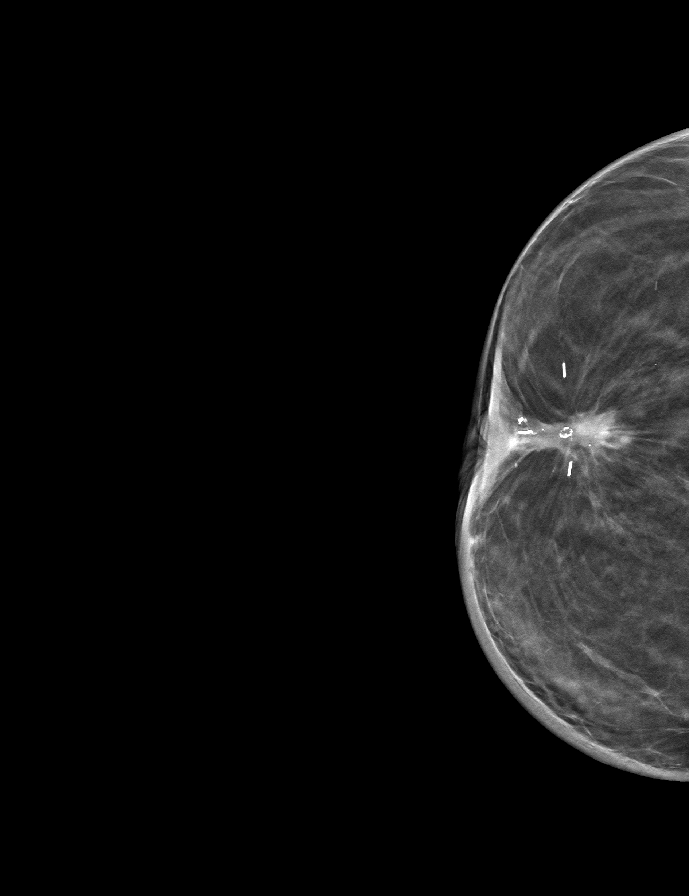

[L CC tomo · tomo slice 27/52.0]
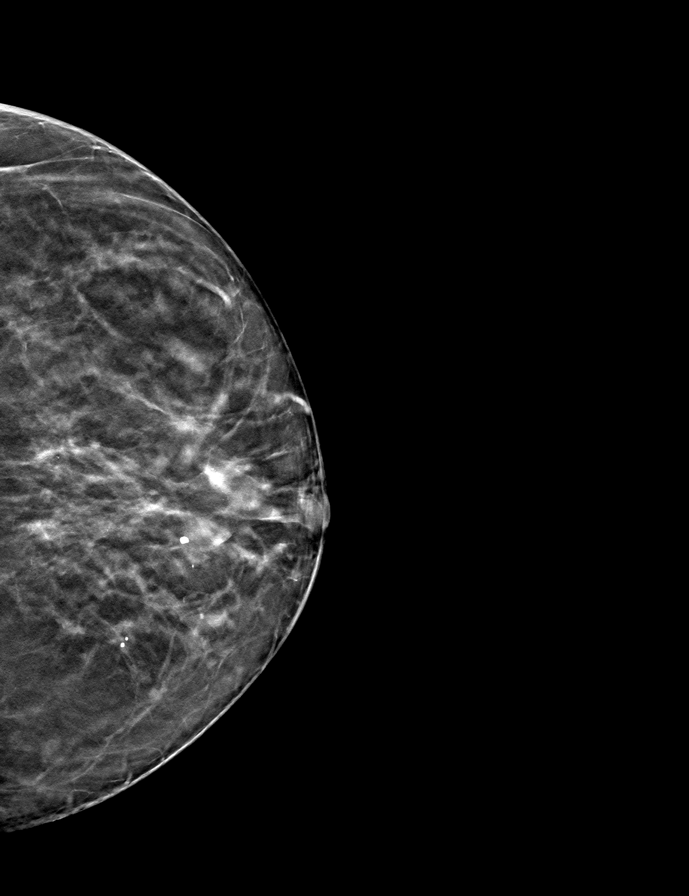

[9 of 25 positions shown; findings below may reference images not displayed]

ACR Breast Density Category c: The breast tissue is heterogeneously
dense, which may obscure small masses.
FINDINGS: The right breast lumpectomy site is stable. No suspicious
calcifications, masses or areas of distortion are seen in the
bilateral breasts.

Mammographic images were processed with CAD.
IMPRESSION: Stable right breast lumpectomy site. No mammographic evidence of
malignancy in the bilateral breasts.

RECOMMENDATION:
Screening mammogram in one year.(Code:[id number])

I have discussed the findings and recommendations with the patient.
Results were also provided in writing at the conclusion of the
visit. If applicable, a reminder letter will be sent to the patient
regarding the next appointment.

BI-RADS CATEGORY  2: Benign.

## 2020-03-08 ENCOUNTER — Ambulatory Visit: Attending: Family Medicine | Primary: Family Medicine

## 2020-03-08 ENCOUNTER — Ambulatory Visit: Admit: 2020-03-08 | Discharge: 2020-03-08 | Payer: MEDICARE | Attending: Family Medicine | Primary: Family Medicine

## 2020-03-08 DIAGNOSIS — Z0000 Encounter for general adult medical examination without abnormal findings: Secondary | ICD-10-CM

## 2020-03-08 LAB — AMB POC COMPLETE CBC,AUTOMATED ENTER
ABS. GRANS (POC): 7.4 10*3/uL (ref 2.0–7.8)
ABS. LYMPHS (POC): 2.8 10*3/uL (ref 0.6–4.1)
GRANULOCYTES (POC): 67.1 % (ref 37.0–92.0)
Granulocytes %, POC: 67.1 % (ref 37.0–92.0)
Granulocytes Abs: 7.4 10*3/uL (ref 2.0–7.8)
HCT (POC): 43.9 % (ref 37.0–51.0)
HGB (POC): 14.4 g/dL (ref 12.0–18.0)
Hematocrit, POC: 43.9 % (ref 37.0–51.0)
Hemoglobin, POC: 14.4 g/dL (ref 12.0–18.0)
LYMPHOCYTES (POC): 25 % (ref 10.0–58.5)
Lymphocyte %: 25 % (ref 10.0–58.5)
Lymphs Abs: 2.8 10*3/uL (ref 0.6–4.1)
MCH (POC): 29.5 pg (ref 26.0–32.0)
MCH: 29.5 pg (ref 26.0–32.0)
MCHC (POC): 32.8 g/dL (ref 31.0–36.0)
MCHC: 32.8 g/dL (ref 31.0–36.0)
MCV (POC): 90 fL (ref 80.0–97.0)
MCV: 90 fL (ref 80.0–97.0)
MID% POC: 7.9 % (ref 0.1–24.0)
MPV (POC): 8 fL (ref 0.0–49.9)
MPV POC: 8 fL (ref 0.0–49.9)
Mid # (POC): 0.9 10*3/uL (ref 0.0–1.8)
Mid Cells %, POC: 7.9 % (ref 0.1–24.0)
Mid Cells Absoulute POC: 0.9 10*3/uL (ref 0.0–1.8)
PLATELET (POC): 189 10*3/uL (ref 140–440)
Platelet Count, POC: 189 10*3/uL (ref 140–440)
RBC (POC): 4.88 10*6/uL (ref 4.20–6.30)
RBC, POC: 4.88 10*6/uL (ref 4.20–6.30)
RDW (POC): 13.8 % (ref 11.5–14.5)
RDW, POC: 13.8 % (ref 11.5–14.5)
WBC (POC): 10.9 10*3/uL (ref 4.1–10.9)
WBC, POC: 10.9 10*3/uL (ref 4.1–10.9)

## 2020-03-08 NOTE — Progress Notes (Signed)
This is the Subsequent Medicare Annual Wellness Exam, performed 12 months or more after the Initial AWV or the last Subsequent AWV    I have reviewed the patient's medical history in detail and updated the computerized patient record.       Assessment/Plan   Education and counseling provided:  Are appropriate based on today's review and evaluation  End-of-Life planning (with patient's consent)    1. Medicare annual wellness visit, subsequent     Patient is here for Medicare wellness exam.  Anticipatory guidance discussed including the importance of sunscreen use, helmet use and seatbelt use.  No depressive symptoms are reported.  Risk for falls is minimal.  Cognitive functioning appears to be intact.  Pneumococcal vaccines including Prevnar 13 and Pneumovax 23 are up-to-date having been done in [facility] according to the patient.  Flu vaccine and COVID vaccine are up-to-date.  Advanced care planning discussed with the patient and documented in a separate note.    Depression Risk Factor Screening     3 most recent PHQ Screens 03/08/2020   Little interest or pleasure in doing things Not at all   Feeling down, depressed, irritable, or hopeless Not at all   Total Score PHQ 2 0       Alcohol & Drug Abuse Risk Screen   Do you average more than 1 drink per night or more than 7 drinks a week?: (P) No  On any one occasion in the past three months have you had more than 3 drinks containing alcohol?: (P) No            Functional Ability and Level of Safety   Hearing:  Hearing: (P) Patient reports hearing is good      Activities of Daily Living:  The home contains: (P) grab bars  Functional ADLs: (P) Patient does total self care     Ambulation:  Patient ambulates: (P) with mild difficulty  Walking is difficult due to: (P) additional comments below  Additional comments about walking difficulties: (P) stiff kneees  Walking aids: (P) cane     Fall Risk:  Fall Risk Assessment, last 12 mths 03/08/2020   Able to walk?  Yes   Fall in past 12 months? 0   Do you feel unsteady? 0   Are you worried about falling 0   Is TUG test greater than 12 seconds? -   Is the gait abnormal? -   Number of falls in past 12 months -   Fall with injury? -     Abuse Screen:  Do you ever feel afraid of your partner?: (P) No  Are you in a relationship with someone who physically or mentally threatens you?: (P) No  Is it safe for you to go home?: (P) Yes        Cognitive Screening   Has your family/caregiver stated any concerns about your memory?: (P) No     Cognitive Screening: Formal cognitive screening does not appear to be clinically indicated    Health Maintenance Due     Health Maintenance Due   Topic Date Due   ??? DTaP/Tdap/Td series (1 - Tdap) Never done   ??? Shingrix Vaccine Age 70> (1 of 2) Never done   ??? Bone Densitometry (Dexa) Screening  Never done   ??? Pneumococcal 65+ years (1 of 1 - PPSV23) Never done   ??? COVID-19 Vaccine (2 - Moderna 3-dose series) 04/08/2019       Patient Care Team   Patient  Care Team:  Delana Meyer, MD as PCP - General (Family Medicine)  Delana Meyer, MD as PCP - [facility] Empaneled Provider  Bittrick, Karie Kirks, MD (Cardiology)    History     Patient Active Problem List   Diagnosis Code   ??? 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 Vitamin D deficiency E55.9   ??? Paroxysmal atrial fibrillation (HCC) I48.0   ??? Essential hypertension I10   ??? 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 Leukocytosis D72.829     Past Medical History:   Diagnosis Date   ??? Arthritis    ??? Atrial fibrillation (HCC)    ??? Breast cancer (HCC) 2015    right    ??? COVID-19 vaccine series completed 04/08/2019    Moderna   ??? GERD (gastroesophageal reflux disease)    ??? Hypertension    ??? Overactive bladder       Past Surgical History:   Procedure Laterality Date   ??? HX APPENDECTOMY     ??? HX BREAST LUMPECTOMY Right     2015   ??? HX KNEE ARTHROSCOPY     ??? HX TUBAL LIGATION  1976   ??? PR BREAST SURGERY  PROCEDURE UNLISTED Right 2015    cancer     Current Outpatient Medications   Medication Sig Dispense Refill   ??? telmisartan (MICARDIS) 80 mg tablet Take 1 tablet by mouth once daily 90 Tablet 0   ??? solifenacin (VESICARE) 10 mg tablet Take 1 tablet by mouth once daily 30 Tablet 5   ??? simvastatin (ZOCOR) 20 mg tablet Take 1 tablet by mouth once daily 30 Tablet 5   ??? furosemide (LASIX) 40 mg tablet Take 1 tablet by mouth once daily 30 Tablet 5   ??? potassium chloride (KLOR-CON) 10 mEq tablet Take 1 tablet by mouth once daily 30 Tablet 5   ??? Eliquis 5 mg tablet Take 1 Tablet by mouth two (2) times a day. MAY DECREASE ASPIRIN DOSAGE TO ONCE DAILY  AND RESTART ELIQUIS ON Tuesday, AUGUST 3RD, IF NO WOUND DRAINAGE. 1 Tablet 0   ??? calcium carbonate (OS-CAL) 500 mg calcium (1,250 mg) tablet Take 1 Tablet by mouth daily.     ??? cholecalciferol (VITAMIN D3) (2,000 UNITS /50 MCG) cap capsule Take 2,000 Units by mouth daily.     ??? metoprolol tartrate (LOPRESSOR) 50 mg tablet TAKE 1 TABLET BY MOUTH TWICE DAILY 180 Tablet 3   ??? dilTIAZem IR (CARDIZEM) 30 mg tablet Take 1 tablet po prn q 4h for afib. Heart rate >100     ??? ferrous sulfate (Iron) 325 mg (65 mg iron) tablet Take 1 Tablet by mouth Daily (before breakfast). 30 Tablet 3     Allergies   Allergen Reactions   ??? Omeprazole Magnesium Itching       Family History   Problem Relation Age of Onset   ??? Hypertension Mother    ??? High Cholesterol Mother    ??? Heart Attack Father    ??? Diabetes Sister    ??? Diabetes Paternal Grandmother    ??? Breast Cancer Maternal Aunt      Social History     Tobacco Use   ??? Smoking status: Never Smoker   ??? Smokeless tobacco: Never Used   Substance Use Topics   ??? Alcohol use: Never         Delana Meyer, MD

## 2020-03-08 NOTE — Progress Notes (Signed)
SUBJECTIVE:   Brandy Vargas is a 80 y.o. female who is here for subsequent Medicare wellness exam.  Patient has a past medical history significant for hypertension, high cholesterol, paroxysmal atrial fibrillation, lower extremity edema, degenerative arthritis status post hip replacement, postoperative blood loss anemia and overactive bladder.  Review of systems reveals no complaints of chest pain, shortness of breath, orthopnea or PND.  GI and GU review of systems is unremarkable.  Current medicines are listed in the EMR and reviewed today.    HPI  See above    Past Medical History, Past Surgical History, Family history, Social History, and Medications were all reviewed with the patient today and updated as necessary.       Current Outpatient Medications   Medication Sig Dispense Refill   ??? telmisartan (MICARDIS) 80 mg tablet Take 1 tablet by mouth once daily 90 Tablet 0   ??? solifenacin (VESICARE) 10 mg tablet Take 1 tablet by mouth once daily 30 Tablet 5   ??? simvastatin (ZOCOR) 20 mg tablet Take 1 tablet by mouth once daily 30 Tablet 5   ??? furosemide (LASIX) 40 mg tablet Take 1 tablet by mouth once daily 30 Tablet 5   ??? potassium chloride (KLOR-CON) 10 mEq tablet Take 1 tablet by mouth once daily 30 Tablet 5   ??? Eliquis 5 mg tablet Take 1 Tablet by mouth two (2) times a day. MAY DECREASE ASPIRIN DOSAGE TO ONCE DAILY  AND RESTART ELIQUIS ON Tuesday, AUGUST 3RD, IF NO WOUND DRAINAGE. 1 Tablet 0   ??? calcium carbonate (OS-CAL) 500 mg calcium (1,250 mg) tablet Take 1 Tablet by mouth daily.     ??? cholecalciferol (VITAMIN D3) (2,000 UNITS /50 MCG) cap capsule Take 2,000 Units by mouth daily.     ??? metoprolol tartrate (LOPRESSOR) 50 mg tablet TAKE 1 TABLET BY MOUTH TWICE DAILY 180 Tablet 3   ??? dilTIAZem IR (CARDIZEM) 30 mg tablet Take 1 tablet po prn q 4h for afib. Heart rate >100       Allergies   Allergen Reactions   ??? Omeprazole Magnesium Itching     Patient Active Problem List   Diagnosis Code   ??? 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 Vitamin D deficiency E55.9   ??? Paroxysmal atrial fibrillation (HCC) I48.0   ??? Essential hypertension I10   ??? 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 History of total hip arthroplasty, left Z61.096   ??? Leukocytosis D72.829     Past Medical History:   Diagnosis Date   ??? Arthritis    ??? Atrial fibrillation (HCC)    ??? Breast cancer (HCC) 2015    right    ??? COVID-19 vaccine series completed 04/08/2019    Moderna   ??? GERD (gastroesophageal reflux disease)    ??? Hypertension    ??? Overactive bladder      Past Surgical History:   Procedure Laterality Date   ??? HX APPENDECTOMY     ??? HX BREAST LUMPECTOMY Right     2015   ??? HX KNEE ARTHROSCOPY     ??? HX TUBAL LIGATION  1976   ??? PR BREAST SURGERY PROCEDURE UNLISTED Right 2015    cancer     Family History   Problem Relation Age of Onset   ??? Hypertension Mother    ??? High Cholesterol Mother    ??? Heart Attack Father    ??? Diabetes Sister    ??? Diabetes Paternal  Grandmother    ??? Breast Cancer Maternal Aunt      Social History     Tobacco Use   ??? Smoking status: Never Smoker   ??? Smokeless tobacco: Never Used   Substance Use Topics   ??? Alcohol use: Never         Review of Systems  See above    OBJECTIVE:  Visit Vitals  BP 118/66   Wt 183 lb 9.6 oz (83.3 kg)   BMI 32.52 kg/m??        Physical Exam  Constitutional:       Appearance: She is well-developed.   HENT:      Head: Normocephalic and atraumatic.      Right Ear: External ear normal.      Left Ear: External ear normal.      Nose: Nose normal.      Mouth/Throat:      Pharynx: No oropharyngeal exudate.   Eyes:      General: No scleral icterus.        Right eye: No discharge.         Left eye: No discharge.      Pupils: Pupils are equal, round, and reactive to light.   Neck:      Thyroid: No thyromegaly.      Vascular: No JVD.      Trachea: No tracheal deviation.   Cardiovascular:      Rate and Rhythm: Normal rate and regular rhythm.      Heart sounds: Normal heart sounds. No murmur heard.  No  friction rub. No gallop.    Pulmonary:      Effort: Pulmonary effort is normal. No respiratory distress.      Breath sounds: Normal breath sounds. No wheezing or rales.   Chest:      Chest wall: No tenderness.   Abdominal:      General: Bowel sounds are normal. There is no distension.      Palpations: Abdomen is soft. There is no mass.      Tenderness: There is no abdominal tenderness. There is no guarding or rebound.   Musculoskeletal:         General: No tenderness. Normal range of motion.      Cervical back: Normal range of motion and neck supple.   Lymphadenopathy:      Cervical: No cervical adenopathy.   Skin:     General: Skin is warm and dry.      Findings: No erythema or rash.   Neurological:      Mental Status: She is alert and oriented to person, place, and time.      Cranial Nerves: No cranial nerve deficit.      Motor: No abnormal muscle tone.      Coordination: Coordination normal.      Deep Tendon Reflexes: Reflexes are normal and symmetric. Reflexes normal.   Psychiatric:         Behavior: Behavior normal.         Thought Content: Thought content normal.         Judgment: Judgment normal.         Medical problems and test results were reviewed with the patient today.         ASSESSMENT and PLAN    1.  Hypertension.  Blood pressure is 118/66.  Check metabolic panel, TSH and CBC.    2.  High cholesterol.  Check lipid panel.    3.  Paroxysmal atrial fibrillation.  No syncope or presyncope reported.    4.  Lower extremity edema.  Patient without complaint today.    Elements of this note have been dictated using speech recognition software. As a result, errors of speech recognition may have occurred.

## 2020-03-08 NOTE — Progress Notes (Signed)
Brandy Vargas is a 80 y.o. female had labs drawn today for labs per provider at [address] [place] [address].  The patient was drawn at  1015am. In the   LEFT arm and the patient tolerated the procedure fine with no issues.       Shary Key

## 2020-03-08 NOTE — Addendum Note (Signed)
Addendum Note  by Shary Key at 03/08/20 0900                Author: Shary Key  Service: --  Author Type: Technician       Filed: 03/08/20 1019  Encounter Date: 03/08/2020  Status: Signed          Editor: Shary Key (Technician)          Addended by: Shary Key on: 03/08/2020 10:19 AM    Modules accepted: Orders

## 2020-03-09 LAB — LIPID PANEL
Cholesterol, Total: 172 mg/dL (ref 100–199)
Cholesterol, total: 172 mg/dL (ref 100–199)
HDL Cholesterol: 53 mg/dL (ref >39)
HDL: 53 mg/dL (ref >39)
LDL Calculated: 100 mg/dL — ABNORMAL HIGH (ref 0–99)
LDL, calculated: 100 mg/dL — ABNORMAL HIGH (ref 0–99)
Triglyceride: 107 mg/dL (ref 0–149)
Triglycerides: 107 mg/dL (ref 0–149)
VLDL, calculated: 19 mg/dL (ref 5–40)
VLDL: 19 mg/dL (ref 5–40)

## 2020-03-09 LAB — COMPREHENSIVE METABOLIC PANEL
ALT: 19 IU/L (ref 0–32)
AST: 23 IU/L (ref 0–40)
Albumin/Globulin Ratio: 1.9 NA (ref 1.2–2.2)
Albumin: 4.4 g/dL (ref 3.6–4.6)
Alkaline Phosphatase: 105 IU/L (ref 44–121)
BUN: 25 mg/dL (ref 8–27)
Bun/Cre Ratio: 30 NA — ABNORMAL HIGH (ref 12–28)
CO2: 26 mmol/L (ref 20–29)
Calcium: 9.5 mg/dL (ref 8.7–10.3)
Chloride: 101 mmol/L (ref 96–106)
Creatinine: 0.84 mg/dL (ref 0.57–1.00)
Est, Glomerular Filtration Rate: 70 mL/min/{1.73_m2} (ref >59)
Globulin, Total: 2.3 g/dL (ref 1.5–4.5)
Glucose: 95 mg/dL (ref 65–99)
Potassium: 4.7 mmol/L (ref 3.5–5.2)
Sodium: 139 mmol/L (ref 134–144)
Total Bilirubin: 0.4 mg/dL (ref 0.0–1.2)
Total Protein: 6.7 g/dL (ref 6.0–8.5)

## 2020-03-09 LAB — TSH 3RD GENERATION
TSH: 1.14 u[IU]/mL (ref 0.450–4.500)
TSH: 1.14 u[IU]/mL (ref 0.450–4.500)

## 2020-03-09 LAB — METABOLIC PANEL, COMPREHENSIVE
A-G Ratio: 1.9 (ref 1.2–2.2)
ALT (SGPT): 19 IU/L (ref 0–32)
AST (SGOT): 23 IU/L (ref 0–40)
Albumin: 4.4 g/dL (ref 3.6–4.6)
Alk. phosphatase: 105 IU/L (ref 44–121)
BUN/Creatinine ratio: 30 — ABNORMAL HIGH (ref 12–28)
BUN: 25 mg/dL (ref 8–27)
Bilirubin, total: 0.4 mg/dL (ref 0.0–1.2)
CO2: 26 mmol/L (ref 20–29)
Calcium: 9.5 mg/dL (ref 8.7–10.3)
Chloride: 101 mmol/L (ref 96–106)
Creatinine: 0.84 mg/dL (ref 0.57–1.00)
GLOBULIN, TOTAL: 2.3 g/dL (ref 1.5–4.5)
Glucose: 95 mg/dL (ref 65–99)
Potassium: 4.7 mmol/L (ref 3.5–5.2)
Protein, total: 6.7 g/dL (ref 6.0–8.5)
Sodium: 139 mmol/L (ref 134–144)
eGFR: 70 mL/min/{1.73_m2} (ref >59)

## 2020-03-28 ENCOUNTER — Ambulatory Visit: Attending: Cardiovascular Disease | Primary: Family Medicine

## 2020-03-28 ENCOUNTER — Ambulatory Visit
Admit: 2020-03-28 | Discharge: 2020-03-28 | Payer: MEDICARE | Attending: Cardiovascular Disease | Primary: Family Medicine

## 2020-03-28 DIAGNOSIS — I499 Cardiac arrhythmia, unspecified: Secondary | ICD-10-CM

## 2020-03-28 NOTE — Progress Notes (Signed)
[place], [place]  [address], [address]  [place] [address]  PHONE: [phone]    SUBJECTIVE: Brandy Vargas (01-Jun-1940) is a 80 y.o. female seen for a follow up visit regarding the following:        ICD-10-CM ICD-9-CM   1. Irregular heart rate  I49.9 427.9   2. Paroxysmal atrial fibrillation (HCC)  I48.0 427.31   3. Dyslipidemia  E78.5 272.4     Patient had left hip replacement, and readmission for hyponatremia. Doing well since, finished rehab. No chest pain. No palpitations. Patient denies syncope. No dyspnea. States they are taking meds. Maintains a normal level of activity for them without symptoms. No dizziness or lightheadedness. All above conditions stable.    Sees PCP regularly, last lipid panel 04/2019, LDL and TC wnl. Only taken diltiazem 30 mg for HR >100 twice in past year. Taking Lasix 40 mg with potassium supplement for LE swelling.     Doing well with no complaints.  Past Medical History, Past Surgical History, Family history, Social History, and Medications were all reviewed with the patient today and updated as necessary.    Outpatient Medications Marked as Taking for the 03/28/20 encounter (Office Visit) with Kaige Whistler, Orland Penman, MD   Medication Sig Dispense Refill   ??? telmisartan (MICARDIS) 80 mg tablet Take 1 tablet by mouth once daily 90 Tablet 0   ??? solifenacin (VESICARE) 10 mg tablet Take 1 tablet by mouth once daily 30 Tablet 5   ??? simvastatin (ZOCOR) 20 mg tablet Take 1 tablet by mouth once daily 30 Tablet 5   ??? furosemide (LASIX) 40 mg tablet Take 1 tablet by mouth once daily 30 Tablet 5   ??? potassium chloride (KLOR-CON) 10 mEq tablet Take 1 tablet by mouth once daily 30 Tablet 5   ??? Eliquis 5 mg tablet Take 1 Tablet by mouth two (2) times a day. MAY DECREASE ASPIRIN DOSAGE TO ONCE DAILY  AND RESTART ELIQUIS ON Tuesday, AUGUST 3RD, IF NO WOUND DRAINAGE. 1 Tablet 0   ??? calcium carbonate (OS-CAL) 500 mg calcium (1,250 mg) tablet Take 1 Tablet by mouth daily.     ???  cholecalciferol (VITAMIN D3) (2,000 UNITS /50 MCG) cap capsule Take 2,000 Units by mouth daily.     ??? metoprolol tartrate (LOPRESSOR) 50 mg tablet TAKE 1 TABLET BY MOUTH TWICE DAILY 180 Tablet 3   ??? dilTIAZem IR (CARDIZEM) 30 mg tablet Take 1 tablet po prn q 4h for afib. Heart rate >100       Allergies   Allergen Reactions   ??? Omeprazole Magnesium Itching     Past Medical History:   Diagnosis Date   ??? Arthritis    ??? Atrial fibrillation ([facility])    ??? Breast cancer ([facility]) 2015    right    ??? COVID-19 vaccine series completed 04/08/2019    Moderna   ??? GERD (gastroesophageal reflux disease)    ??? Hypertension    ??? Overactive bladder      Past Surgical History:   Procedure Laterality Date   ??? HX APPENDECTOMY     ??? HX BREAST LUMPECTOMY Right     2015   ??? HX KNEE ARTHROSCOPY     ??? HX TUBAL LIGATION  1976   ??? PR BREAST SURGERY PROCEDURE UNLISTED Right 2015    cancer     Family History   Problem Relation Age of Onset   ??? Hypertension Mother    ??? High Cholesterol Mother    ???  Heart Attack Father    ??? Diabetes Sister    ??? Diabetes Paternal Grandmother    ??? Breast Cancer Maternal Aunt       Social History     Tobacco Use   ??? Smoking status: Never Smoker   ??? Smokeless tobacco: Never Used   Substance Use Topics   ??? Alcohol use: Never     @JBFAM @    Current Outpatient Medications:   ???  telmisartan (MICARDIS) 80 mg tablet, Take 1 tablet by mouth once daily, Disp: 90 Tablet, Rfl: 0  ???  solifenacin (VESICARE) 10 mg tablet, Take 1 tablet by mouth once daily, Disp: 30 Tablet, Rfl: 5  ???  simvastatin (ZOCOR) 20 mg tablet, Take 1 tablet by mouth once daily, Disp: 30 Tablet, Rfl: 5  ???  furosemide (LASIX) 40 mg tablet, Take 1 tablet by mouth once daily, Disp: 30 Tablet, Rfl: 5  ???  potassium chloride (KLOR-CON) 10 mEq tablet, Take 1 tablet by mouth once daily, Disp: 30 Tablet, Rfl: 5  ???  Eliquis 5 mg tablet, Take 1 Tablet by mouth two (2) times a day. MAY DECREASE ASPIRIN DOSAGE TO ONCE DAILY  AND RESTART ELIQUIS ON Tuesday, AUGUST 3RD, IF NO WOUND  DRAINAGE., Disp: 1 Tablet, Rfl: 0  ???  calcium carbonate (OS-CAL) 500 mg calcium (1,250 mg) tablet, Take 1 Tablet by mouth daily., Disp: , Rfl:   ???  cholecalciferol (VITAMIN D3) (2,000 UNITS /50 MCG) cap capsule, Take 2,000 Units by mouth daily., Disp: , Rfl:   ???  metoprolol tartrate (LOPRESSOR) 50 mg tablet, TAKE 1 TABLET BY MOUTH TWICE DAILY, Disp: 180 Tablet, Rfl: 3  ???  dilTIAZem IR (CARDIZEM) 30 mg tablet, Take 1 tablet po prn q 4h for afib. Heart rate >100, Disp: , Rfl:         ROS:  Review of Systems - General ROS: negative for - chills, fatigue or fever  Hematological and Lymphatic ROS: negative for - bleeding problems, bruising or jaundice  Respiratory ROS: no cough, shortness of breath, or wheezing  Cardiovascular ROS: no chest pain or dyspnea on exertion  Gastrointestinal ROS: no abdominal pain, change in bowel habits, or black or bloody stools  Neurological ROS: no TIA or stroke symptoms  All other systems negative.      Wt Readings from Last 3 Encounters:   03/28/20 185 lb (83.9 kg)   03/08/20 183 lb 9.6 oz (83.3 kg)   10/27/19 174 lb 12.8 oz (79.3 kg)     Temp Readings from Last 3 Encounters:   08/12/19 98.3 ??F (36.8 ??C)   08/01/19 97.6 ??F (36.4 ??C)     BP Readings from Last 3 Encounters:   03/28/20 (!) 134/58   03/08/20 118/66   10/27/19 112/62     Pulse Readings from Last 3 Encounters:   03/28/20 60   09/21/19 (!) 58   08/12/19 70           PHYSICAL EXAM:  Visit Vitals  BP (!) 134/58   Pulse 60   Ht 5' 3"  (1.6 m)   Wt 185 lb (83.9 kg)   BMI 32.77 kg/m??       Physical Examination: General appearance - alert, well appearing, and in no distress  Mental status - alert, oriented to person, place, and time  Eyes - pupils equal and reactive, extraocular eye movements intact  Neck/lymph - supple, no significant adenopathy  Chest/lungs - clear to auscultation, no wheezes, rales or rhonchi, symmetric air entry  Heart/CV - normal rate, regular rhythm, normal S1, S2, no murmurs, rubs, clicks or gallops  Abdomen/GI  - soft, nontender, nondistended, no masses or organomegaly  Musculoskeletal - no joint tenderness, deformity or swelling  Extremities - peripheral pulses normal, pedal edema 1+ b/l, no clubbing or cyanosis  Skin - normal coloration and turgor, no rashes, no suspicious skin lesions noted                      Medications reviewed and questions answered    Recent Results (from the past 672 hour(s))   METABOLIC PANEL, COMPREHENSIVE    Collection Time: 03/08/20  1:52 AM   Result Value Ref Range    Glucose 95 65 - 99 mg/dL    BUN 25 8 - 27 mg/dL    Creatinine 0.84 0.57 - 1.00 mg/dL    eGFR 70 >59 mL/min/1.73    BUN/Creatinine ratio 30 (H) 12 - 28    Sodium 139 134 - 144 mmol/L    Potassium 4.7 3.5 - 5.2 mmol/L    Chloride 101 96 - 106 mmol/L    CO2 26 20 - 29 mmol/L    Calcium 9.5 8.7 - 10.3 mg/dL    Protein, total 6.7 6.0 - 8.5 g/dL    Albumin 4.4 3.6 - 4.6 g/dL    GLOBULIN, TOTAL 2.3 1.5 - 4.5 g/dL    A-G Ratio 1.9 1.2 - 2.2    Bilirubin, total 0.4 0.0 - 1.2 mg/dL    Alk. phosphatase 105 44 - 121 IU/L    AST (SGOT) 23 0 - 40 IU/L    ALT (SGPT) 19 0 - 32 IU/L   LIPID PANEL    Collection Time: 03/08/20  1:52 AM   Result Value Ref Range    Cholesterol, total 172 100 - 199 mg/dL    Triglyceride 107 0 - 149 mg/dL    HDL Cholesterol 53 >39 mg/dL    VLDL, calculated 19 5 - 40 mg/dL    LDL, calculated 100 (H) 0 - 99 mg/dL   TSH 3RD GENERATION    Collection Time: 03/08/20  1:52 AM   Result Value Ref Range    TSH 1.140 0.450 - 4.500 uIU/mL   AMB POC COMPLETE CBC,AUTOMATED ENTER    Collection Time: 03/08/20 10:15 AM   Result Value Ref Range    WBC (POC) 10.9 4.1 - 10.9 10^3/ul    ABS. LYMPHS (POC) 2.8 0.6 - 4.1 10^3/ul    Mid # (POC) 0.9 0.0 - 1.8 10^3/ul    ABS. GRANS (POC) 7.4 2.0 - 7.8 10^3/ul    LYMPHOCYTES (POC) 25.0 10.0 - 58.5 %    MID% POC 7.9 0.1 - 24.0 %    GRANULOCYTES (POC) 67.1 37.0 - 92.0 %    RBC (POC) 4.88 4.20 - 6.30 10^6/ul    HGB (POC) 14.4 12.0 - 18.0 g/dL    HCT (POC) 43.9 37.0 - 51.0 %    MCV (POC) 90.0 80.0  - 97.0 fL    MCH (POC) 29.5 26.0 - 32.0 pg    MCHC (POC) 32.8 31.0 - 36.0 g/dL    RDW (POC) 13.8 11.5 - 14.5 %    PLATELET (POC) 189 140 - 440 10^3/ul    MPV (POC) 8.0 0.0 - 49.9 fL     Lab Results   Component Value Date/Time    Cholesterol, total 172 03/08/2020 01:52 AM    HDL Cholesterol 53 03/08/2020 01:52 AM    LDL,  calculated 100 (H) 03/08/2020 01:52 AM    VLDL, calculated 19 03/08/2020 01:52 AM    Triglyceride 107 03/08/2020 01:52 AM         ASSESSMENT and PLAN        1. Irregular heart rate  The current medical regimen is effective;  continue present plan and Eliquis 5 mg, metoprolol 50 mg, diltiazem 30 mg for HR >100.    2. Paroxysmal atrial fibrillation (HCC)  The current medical regimen is effective;  continue present plan and Eliquis 5 mg, metoprolol 50 mg, diltiazem 30 mg for HR >100.    3. Dyslipidemia  The current medical regimen is effective;  continue present plan and simvastatin 20 mg.  Patient is okay to be off aspirin 81 current guidelines there is no role for aspirin in the management of A. fib      No orders of the defined types were placed in this encounter.      Pt is instructed to follow all appropriate cardiac risk factor recommendations and to be compliant with meds and testing. Instructed to follow up appropriately and seek urgent medical care if acute or unstable issues arise. Results of some tests may be viewed thru [facility] but this does not substitute for follow up with MD. If follow up is not scheduled pt is instructed to call for follow up      Follow-up and Dispositions    ?? Return in about 6 months (around 09/28/2020).               Jens Som, MD  03/28/2020  9:13 AM

## 2020-04-19 ENCOUNTER — Telehealth: Attending: Family Medicine | Primary: Family Medicine

## 2020-04-19 ENCOUNTER — Telehealth: Admit: 2020-04-19 | Discharge: 2020-04-19 | Payer: MEDICARE | Attending: Family Medicine | Primary: Family Medicine

## 2020-04-19 DIAGNOSIS — E7800 Pure hypercholesterolemia, unspecified: Secondary | ICD-10-CM

## 2020-04-19 NOTE — Progress Notes (Signed)
SUBJECTIVE:   Brandy Vargas is a 80 y.o. female who has a past medical history significant for hypertension, high cholesterol, paroxysmal atrial fibrillation, lower extremity edema, degenerative arthritis status post hip replacement, postoperative blood loss anemia and overactive bladder.  Review of systems reveals no complaints of chest pain, shortness of breath, orthopnea or PND.  GI and GU review of systems is unremarkable.  Current medicines are listed in the EMR and reviewed today.  Patient presents today for virtual visit via telephone encounter.  Patient does report that she has been experiencing some neck pain for couple of days.  No trauma reported.  No radicular complaints into her upper extremities.  She describes her neck pain as a "stiffness".    Patient's vital signs were not performed as encounter was performed virtually.  Location of virtual visit was patient's home.      HPI  See above    Past Medical History, Past Surgical History, Family history, Social History, and Medications were all reviewed with the patient today and updated as necessary.       Current Outpatient Medications   Medication Sig Dispense Refill   ??? telmisartan (MICARDIS) 80 mg tablet Take 1 tablet by mouth once daily 90 Tablet 0   ??? solifenacin (VESICARE) 10 mg tablet Take 1 tablet by mouth once daily 30 Tablet 5   ??? simvastatin (ZOCOR) 20 mg tablet Take 1 tablet by mouth once daily 30 Tablet 5   ??? furosemide (LASIX) 40 mg tablet Take 1 tablet by mouth once daily 30 Tablet 5   ??? potassium chloride (KLOR-CON) 10 mEq tablet Take 1 tablet by mouth once daily 30 Tablet 5   ??? Eliquis 5 mg tablet Take 1 Tablet by mouth two (2) times a day. MAY DECREASE ASPIRIN DOSAGE TO ONCE DAILY  AND RESTART ELIQUIS ON Tuesday, AUGUST 3RD, IF NO WOUND DRAINAGE. 1 Tablet 0   ??? calcium carbonate (OS-CAL) 500 mg calcium (1,250 mg) tablet Take 1 Tablet by mouth daily.     ??? cholecalciferol (VITAMIN D3) (2,000 UNITS /50 MCG) cap capsule Take 2,000 Units  by mouth daily.     ??? metoprolol tartrate (LOPRESSOR) 50 mg tablet TAKE 1 TABLET BY MOUTH TWICE DAILY 180 Tablet 3   ??? dilTIAZem IR (CARDIZEM) 30 mg tablet Take 1 tablet po prn q 4h for afib. Heart rate >100       Allergies   Allergen Reactions   ??? Omeprazole Magnesium Itching     Patient Active Problem List   Diagnosis Code   ??? 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 Vitamin D deficiency E55.9   ??? Paroxysmal atrial fibrillation (HCC) I48.0   ??? Essential hypertension I10   ??? 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 Leukocytosis D72.829     Past Medical History:   Diagnosis Date   ??? Arthritis    ??? Atrial fibrillation (HCC)    ??? Breast cancer (HCC) 2015    right    ??? COVID-19 vaccine series completed 04/08/2019    Moderna   ??? GERD (gastroesophageal reflux disease)    ??? Hypertension    ??? Overactive bladder      Past Surgical History:   Procedure Laterality Date   ??? HX APPENDECTOMY     ??? HX BREAST LUMPECTOMY Right     2015   ??? HX KNEE ARTHROSCOPY     ??? HX  TUBAL LIGATION  1976   ??? PR BREAST SURGERY PROCEDURE UNLISTED Right 2015    cancer     Family History   Problem Relation Age of Onset   ??? Hypertension Mother    ??? High Cholesterol Mother    ??? Heart Attack Father    ??? Diabetes Sister    ??? Diabetes Paternal Grandmother    ??? Breast Cancer Maternal Aunt      Social History     Tobacco Use   ??? Smoking status: Never Smoker   ??? Smokeless tobacco: Never Used   Substance Use Topics   ??? Alcohol use: Never         Review of Systems  See above    OBJECTIVE:  There were no vitals taken for this visit.     Physical Exam    Medical problems and test results were reviewed with the patient today.         ASSESSMENT and PLAN    1.  High cholesterol.  LDL 100.  Liver enzymes remain normal.  Continue statin therapy.    2.  Paroxysmal atrial fibrillation.  Continue follow-up with cardiology.  No syncope or presyncope reported.    3.  Hypertension.  Renal function and  electrolytes are normal.  Last BP was 118/66.    4.  Lower extremity edema.  No complaints.  Continue Lasix and potassium supplementation.  Sodium and potassium are normal.    5.  Neck pain.  Treat with moist heat, gentle stretching and Voltaren gel as needed.  Return if symptoms persist or worsen.          Consent:  This patient and/or their healthcare decision maker is aware that this patient-initiated Telehealth encounter is a billable service, with coverage as determined by their insurance carrier. Patient is aware that they may receive a bill and has provided verbal consent to proceed: Yes    I was in the office while conducting this encounter.      This virtual visit was conducted telephone encounter only. -  I affirm this is a Patient Initiated Episode with an Established Patient who has not had a related appointment within my department in the past 7 days or scheduled within the next 24 hours.  Note: this encounter is not billable if this call serves to triage the patient into an appointment for the relevant concern.      Total Time: minutes: 21-30 minutes.    Elements of this note have been dictated using speech recognition software. As a result, errors of speech recognition may have occurred.

## 2020-05-08 MED ORDER — SIMVASTATIN 20 MG TAB
20 mg | ORAL_TABLET | ORAL | 0 refills | Status: AC
Start: 2020-05-08 — End: ?

## 2020-05-16 ENCOUNTER — Encounter

## 2020-05-16 NOTE — Telephone Encounter (Signed)
Patient requesting referral to podiatrist for help trimming toenails      [facility] podiatry

## 2020-05-31 MED ORDER — POTASSIUM CHLORIDE CRYS ER 10 MEQ PO TBCR
10 MEQ | ORAL_TABLET | ORAL | 5 refills | Status: DC
Start: 2020-05-31 — End: 2020-10-05

## 2020-05-31 MED ORDER — FUROSEMIDE 40 MG PO TABS
40 MG | ORAL_TABLET | ORAL | 5 refills | Status: DC
Start: 2020-05-31 — End: 2020-10-05

## 2020-05-31 MED ORDER — SIMVASTATIN 20 MG PO TABS
20 MG | ORAL_TABLET | Freq: Every evening | ORAL | 5 refills | Status: DC
Start: 2020-05-31 — End: 2020-10-03

## 2020-06-08 ENCOUNTER — Encounter

## 2020-06-08 MED ORDER — TELMISARTAN 80 MG PO TABS
80 MG | ORAL_TABLET | Freq: Every day | ORAL | 5 refills | Status: DC
Start: 2020-06-08 — End: 2020-12-07

## 2020-06-08 NOTE — Telephone Encounter (Signed)
Please Advise

## 2020-07-05 MED ORDER — SOLIFENACIN SUCCINATE 10 MG PO TABS
10 MG | ORAL_TABLET | ORAL | 2 refills | Status: DC
Start: 2020-07-05 — End: 2020-10-07

## 2020-07-12 MED ORDER — METOPROLOL TARTRATE 50 MG PO TABS
50 MG | ORAL_TABLET | ORAL | 3 refills | Status: AC
Start: 2020-07-12 — End: 2021-07-20

## 2020-08-31 ENCOUNTER — Encounter: Payer: MEDICARE | Attending: Orthopaedic Surgery | Primary: Family Medicine

## 2020-08-31 ENCOUNTER — Encounter: Attending: Orthopaedic Surgery | Primary: Family Medicine

## 2020-09-12 MED ORDER — ELIQUIS 5 MG PO TABS
5 MG | ORAL_TABLET | Freq: Two times a day (BID) | ORAL | 3 refills | Status: AC
Start: 2020-09-12 — End: 2021-09-25

## 2020-09-13 NOTE — Telephone Encounter (Signed)
Received call from [place] at [facility] with [employer].    Subjective: Caller states "Very weak"     Current Symptoms: Generalized weakness  Blood pressure has been running a little low 137/54 HR below 60, light headed  Takes Losartan for HTN    Onset: Yesterday morning    Pain Severity: 0/10    Temperature: Patient is reported to not have a fever. Also denies chills and sweats     What has been tried: Staying in recliner, drinking fluids    History related to the current reason for call: Problem list reviewed and no current problems related to this reason for call    Recommended disposition: See in Office Today    Care advice provided, patient verbalizes understanding; denies any other questions or concerns; instructed to call back for any new or worsening symptoms.    Patient/Caller agrees with recommended disposition; Clinical research associate provided warm transfer to [place] at [employer] for appointment scheduling     Attention Provider:  Thank you for allowing me to participate in the care of your patient.  The patient was connected to triage in response to information provided to the ECC/PSC.  Please do not respond through this encounter as the response is not directed to a shared pool.      Reason for Disposition   MODERATE weakness (i.e., interferes with work, school, normal activities) and cause unknown  (Exceptions: Weakness with acute minor illness, or weakness from poor fluid intake.)    Protocols used: Weakness (Generalized) and Fatigue-ADULT-OH

## 2020-09-14 ENCOUNTER — Ambulatory Visit: Admit: 2020-09-14 | Discharge: 2020-09-14 | Payer: MEDICARE | Attending: Family Medicine | Primary: Family Medicine

## 2020-09-14 DIAGNOSIS — R5383 Other fatigue: Secondary | ICD-10-CM

## 2020-09-14 MED ORDER — HANDICAP PLACARD
0 refills | Status: AC
Start: 2020-09-14 — End: ?

## 2020-09-14 MED ORDER — TRIAMCINOLONE ACETONIDE 40 MG/ML IJ SUSP
40 MG/ML | Freq: Once | INTRAMUSCULAR | Status: AC
Start: 2020-09-14 — End: 2020-09-14
  Administered 2020-09-14: 14:00:00 40 mg via INTRAMUSCULAR

## 2020-09-14 NOTE — Addendum Note (Signed)
Addended by: Shary Key on: 09/14/2020 10:24 AM     Modules accepted: Orders

## 2020-09-14 NOTE — Progress Notes (Signed)
SUBJECTIVE:   Brandy Vargas is a 80 y.o. female who has a past medical history significant for hypertension, high cholesterol, paroxysmal atrial fibrillation, lower extremity edema, degenerative arthritis status post hip replacement, postoperative blood loss anemia and overactive bladder.  Patient presents today reporting that on Sunday morning she had abrupt onset of dizziness with changes in position.  She felt lightheaded.  She has not fallen.  She is felt weak and fatigued as well.  She reports no palpitations, chest pain, shortness of breath, orthopnea or PND.  Reports no changes in bowels or bladder.  Has had no significant upper respiratory symptoms.  Has had no visual loss or disturbance or slurred speech.    HPI  See above    Past Medical History, Past Surgical History, Family history, Social History, and Medications were all reviewed with the patient today and updated as necessary.       Current Outpatient Medications   Medication Sig Dispense Refill    ELIQUIS 5 MG TABS tablet Take 1 tablet by mouth 2 times daily 180 tablet 3    metoprolol tartrate (LOPRESSOR) 50 MG tablet Take 1 tablet by mouth twice daily 180 tablet 3    solifenacin (VESICARE) 10 MG tablet Take 1 tablet by mouth once daily 30 tablet 2    telmisartan (MICARDIS) 80 MG tablet Take 1 tablet by mouth daily 30 tablet 5    potassium chloride (KLOR-CON M) 10 MEQ extended release tablet Take 1 tablet by mouth once daily 30 tablet 5    furosemide (LASIX) 40 MG tablet Take 1 tablet by mouth once daily 30 tablet 5    simvastatin (ZOCOR) 20 MG tablet Take 1 tablet by mouth nightly 30 tablet 5    calcium carbonate (OYSTER SHELL CALCIUM 500 MG) 1250 (500 Ca) MG tablet Take 1 tablet by mouth daily      Cholecalciferol 50 MCG (2000 UT) CAPS Take 2,000 Units by mouth daily      dilTIAZem (CARDIZEM) 30 MG tablet Take 1 tablet po prn q 4h for afib. Heart rate >100       No current facility-administered medications for this visit.     Allergies   Allergen  Reactions    Omeprazole Magnesium Itching     Patient Active Problem List   Diagnosis    Arthritis of left hip    Pure hyperglyceridemia    History of total hip arthroplasty, left    Paroxysmal atrial fibrillation (HCC)    Osteoarthritis    Vitamin D deficiency    Essential hypertension     Past Medical History:   Diagnosis Date    Arthritis     Atrial fibrillation (HCC)     Breast cancer (HCC) 2015    right     COVID-19 vaccine series completed 04/08/2019    Moderna    GERD (gastroesophageal reflux disease)     Hypertension     Overactive bladder      Past Surgical History:   Procedure Laterality Date    APPENDECTOMY      BREAST LUMPECTOMY Right     20 15    BREAST SURGERY Right 2015    cancer    KNEE ARTHROSCOPY      TUBAL LIGATION  1976     Family History   Problem Relation Age of Onset    Breast Cancer Maternal Aunt     Diabetes Paternal Grandmother     Diabetes Sister     Heart Attack Father  Hypertension Mother     High Cholesterol Mother      Social History     Tobacco Use    Smoking status: Never    Smokeless tobacco: Never   Substance Use Topics    Alcohol use: Never         Review of Systems  See above    OBJECTIVE:  BP 124/60    Ht 5\' 3"  (1.6 m)    Wt 194 lb 3.2 oz (88.1 kg)    BMI 34.40 kg/m??      Physical Exam  Constitutional:       General: She is not in acute distress.     Appearance: Normal appearance. She is not ill-appearing.   HENT:      Head: Normocephalic and atraumatic.      Ears:      Comments: TMs are dull bilaterally with clear effusions.  Cardiovascular:      Rate and Rhythm: Normal rate and regular rhythm.      Heart sounds: Normal heart sounds. No murmur heard.  Pulmonary:      Effort: Pulmonary effort is normal.      Breath sounds: Normal breath sounds. No wheezing or rhonchi.   Musculoskeletal:         General: Normal range of motion.      Cervical back: Normal range of motion and neck supple.      Right lower leg: No edema.      Left lower leg: No edema.      Comments: No significant  peripheral edema is noted.   Skin:     General: Skin is warm.      Findings: No rash.   Neurological:      Mental Status: She is alert and oriented to person, place, and time.      Comments: Neurological exam is nonfocal.  Fundi benign.   Psychiatric:         Mood and Affect: Mood normal.         Behavior: Behavior normal.         Thought Content: Thought content normal.         Judgment: Judgment normal.       Medical problems and test results were reviewed with the patient today.         ASSESSMENT and PLAN    1.  Fatigue.  Etiology unclear.  Does have a history in the past of hyponatremia.  Checking electrolytes, CBC and TSH.  Temporarily withhold Lasix.  Monitor blood pressure outside the office and reevaluate after labs are available.    2.  Dizziness.  Possible orthostasis versus eustachian tube dysfunction.  As above.  In addition treat with over-the-counter plain Mucinex, Nasacort and 1 cc of Kenalog IM.  If symptoms worsen she is to return or go to the ER.    3.  Hypertension.  BP 124/60.  Monitor blood pressure as we are withholding temporarily her Lasix.    4.  Paroxysmal atrial fibrillation.  Currently in sinus rhythm.  No syncope reported.    5.  Lower extremity edema.  No evidence of edema today.  Monitor for worsening symptoms as we are withholding Lasix.    6.  High cholesterol.  Patient is due for labs.  Check lipid panel.    7.  Bilateral eustachian tube dysfunction.  As above.    8.  Allergic rhinitis.  As above.  1 cc of Kenalog IM.  Plain Mucinex and Nasacort.  Elements of this note have been dictated using speech recognition software. As a result, errors of speech recognition may have occurred.

## 2020-09-14 NOTE — Addendum Note (Signed)
Addended by: Iline Oven on: 09/14/2020 10:29 AM     Modules accepted: Orders

## 2020-09-15 LAB — COMPREHENSIVE METABOLIC PANEL
ALT: 20 U/L (ref 12–65)
AST: 15 U/L (ref 15–37)
Albumin/Globulin Ratio: 1.3 (ref 1.2–3.5)
Albumin: 3.7 g/dL (ref 3.2–4.6)
Alk Phosphatase: 110 U/L (ref 50–136)
Anion Gap: 4 mmol/L (ref 4–13)
BUN: 30 MG/DL — ABNORMAL HIGH (ref 8–23)
CO2: 31 mmol/L (ref 21–32)
Calcium: 9 MG/DL (ref 8.3–10.4)
Chloride: 104 mmol/L (ref 101–110)
Creatinine: 0.9 MG/DL (ref 0.6–1.0)
GFR African American: >60 mL/min/{1.73_m2} (ref >60)
GFR Non-African American: >60 mL/min/{1.73_m2} (ref >60)
Globulin: 2.9 g/dL (ref 2.3–3.5)
Glucose: 90 mg/dL (ref 65–100)
Potassium: 4.7 mmol/L (ref 3.5–5.1)
Sodium: 139 mmol/L (ref 136–145)
Total Bilirubin: 0.4 MG/DL (ref 0.2–1.1)
Total Protein: 6.6 g/dL (ref 6.3–8.2)

## 2020-09-15 LAB — TSH: TSH, 3RD GENERATION: 1.3 u[IU]/mL (ref 0.358–3.740)

## 2020-09-15 LAB — LIPID PANEL
Chol/HDL Ratio: 3.3
Cholesterol, Total: 137 MG/DL (ref <200)
HDL: 41 MG/DL (ref 40–60)
LDL Calculated: 68.2 MG/DL (ref <100)
Triglycerides: 139 MG/DL (ref 35–150)
VLDL Cholesterol Calculated: 27.8 MG/DL — ABNORMAL HIGH (ref 6.0–23.0)

## 2020-09-16 LAB — AMB POC KTY COMPLETE CBC
Granulocytes %, POC: 70.5 %
Granulocytes Abs: 2.9 10*3/uL
Hematocrit, POC: 42.4 %
Hemoglobin, POC: 13.9 G/DL
Lymphocyte %: 23.6 %
Lymphs Abs: 2 10*3/uL
MCH: 29.9 pg — AB (ref 40–?)
MCHC: 32.8
MCV: 91.1
MPV POC: 9 fL
Monocyte %: 5.9 %
Monocyte Absolute, POC: 0.5 10*3/uL
Platelet Count, POC: 187 10*3/uL
RBC, POC: 4.65 M/uL
RDW, POC: 13.7 %
WBC, POC: 8.4 10*3/uL

## 2020-09-22 ENCOUNTER — Ambulatory Visit: Admit: 2020-09-22 | Discharge: 2020-09-22 | Payer: MEDICARE | Attending: Family Medicine | Primary: Family Medicine

## 2020-09-22 DIAGNOSIS — I10 Essential (primary) hypertension: Secondary | ICD-10-CM

## 2020-09-22 NOTE — Progress Notes (Signed)
SUBJECTIVE:   Brandy Vargas is a 80 y.o. female who has a past medical history significant for hypertension, high cholesterol, paroxysmal atrial fibrillation, lower extremity edema, degenerative arthritis status post hip replacement, postoperative blood loss anemia and overactive bladder.  Patient presents today reporting that earlier this morning she felt poorly and checked her blood pressure.  It was 160/80.  She states that she felt some pressure in her chest for a few minutes which is subsequently subsided.  She had a slight headache as well.  She reports no current chest pain.  She reports no associated nausea or shortness of breath.  There was no radiation of this chest pressure.  She reports no rapid heart rate.  She reports no visual loss or disturbance, slurred speech or muscle weakness.  She reports compliance with her medicines which are listed in the EMR and reviewed today.  She denies any excessive stress.  She has been taking some Mucinex for some cold symptoms.  When questioned she reports that she has not had any type of a decongestant.    HPI  See above    Past Medical History, Past Surgical History, Family history, Social History, and Medications were all reviewed with the patient today and updated as necessary.       Current Outpatient Medications   Medication Sig Dispense Refill    Handicap Placard MISC by Does not apply route 1 each 0    ELIQUIS 5 MG TABS tablet Take 1 tablet by mouth 2 times daily 180 tablet 3    metoprolol tartrate (LOPRESSOR) 50 MG tablet Take 1 tablet by mouth twice daily 180 tablet 3    solifenacin (VESICARE) 10 MG tablet Take 1 tablet by mouth once daily 30 tablet 2    telmisartan (MICARDIS) 80 MG tablet Take 1 tablet by mouth daily 30 tablet 5    potassium chloride (KLOR-CON M) 10 MEQ extended release tablet Take 1 tablet by mouth once daily 30 tablet 5    furosemide (LASIX) 40 MG tablet Take 1 tablet by mouth once daily 30 tablet 5    simvastatin (ZOCOR) 20 MG tablet  Take 1 tablet by mouth nightly 30 tablet 5    calcium carbonate (OYSTER SHELL CALCIUM 500 MG) 1250 (500 Ca) MG tablet Take 1 tablet by mouth daily      Cholecalciferol 50 MCG (2000 UT) CAPS Take 2,000 Units by mouth daily      dilTIAZem (CARDIZEM) 30 MG tablet Take 1 tablet po prn q 4h for afib. Heart rate >100       No current facility-administered medications for this visit.     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     COVID-19 vaccine series completed 04/08/2019    Moderna    GERD (gastroesophageal reflux disease)     Hypertension     Overactive bladder      Past Surgical History:   Procedure Laterality Date    APPENDECTOMY  BREAST LUMPECTOMY Right     2015    BREAST SURGERY Right 2015    cancer    KNEE ARTHROSCOPY      TUBAL LIGATION  1976     Family History   Problem Relation Age of Onset    Breast Cancer Maternal Aunt     Diabetes Paternal Grandmother     Diabetes Sister     Heart Attack Father     Hypertension Mother     High Cholesterol Mother      Social History     Tobacco Use    Smoking status: Never    Smokeless tobacco: Never   Substance Use Topics    Alcohol use: Never         Review of Systems  See above    OBJECTIVE:  BP (!) 142/78    Ht 5\' 3"  (1.6 m)    Wt 194 lb 3.2 oz (88.1 kg)    BMI 34.40 kg/m??      Physical Exam  Constitutional:       General: She is not in acute distress.     Appearance: Normal appearance. She is not ill-appearing.   HENT:      Head: Normocephalic and atraumatic.   Cardiovascular:      Rate and Rhythm: Normal rate and regular rhythm.      Heart sounds: Normal heart sounds. No murmur  heard.  Pulmonary:      Effort: Pulmonary effort is normal.      Breath sounds: Normal breath sounds. No wheezing or rhonchi.   Musculoskeletal:         General: Normal range of motion.      Cervical back: Normal range of motion and neck supple.      Right lower leg: No edema.      Left lower leg: No edema.   Skin:     General: Skin is warm.      Findings: No rash.   Neurological:      Mental Status: She is alert and oriented to person, place, and time.   Psychiatric:         Mood and Affect: Mood normal.         Behavior: Behavior normal.         Thought Content: Thought content normal.         Judgment: Judgment normal.       Medical problems and test results were reviewed with the patient today.     Twelve-lead EKG reveals sinus bradycardia with a ventricular rate of approximately 50.  There are no acute ischemic changes.  Axis appears to be normal.    ASSESSMENT and PLAN    1.  Hypertension.  BP 142/78.  Patient instructed to continue to monitor blood pressure and if it starting to rise consistently to let me know.  I am hesitant to make adjustments in her blood pressure medication today because historically has been controlled and I do not want to bottom her out.  Discontinue cold medicine.  She has an appointment next week with her cardiologist.    2.  Atypical chest pain.  Unclear as to whether or not this is cardiac or potentially anxiety/stress related.  EKG unremarkable.  Instructed the patient if she develops any recurrent symptoms to go straight to the emergency room.  Fortunately she has an appointment early next week with her cardiologist which she will keep.    3.  A. fib.  Currently in sinus  rhythm.  Heart rates low but stable.  No syncope or presyncope reported.  Elements of this note have been dictated using speech recognition software. As a result, errors of speech recognition may have occurred.

## 2020-09-22 NOTE — Progress Notes (Signed)
 Formatting of this note is different from the original.  SUBJECTIVE:   Brandy Vargas is a 80 y.o. female who has a past medical history significant for hypertension, high cholesterol, paroxysmal atrial fibrillation, lower extremity edema, degenerative arthritis status post hip replacement, postoperative blood loss anemia and overactive bladder.  Patient presents today reporting that earlier this morning she felt poorly and checked her blood pressure.  It was 160/80.  She states that she felt some pressure in her chest for a few minutes which is subsequently subsided.  She had a slight headache as well.  She reports no current chest pain.  She reports no associated nausea or shortness of breath.  There was no radiation of this chest pressure.  She reports no rapid heart rate.  She reports no visual loss or disturbance, slurred speech or muscle weakness.  She reports compliance with her medicines which are listed in the EMR and reviewed today.  She denies any excessive stress.  She has been taking some Mucinex for some cold symptoms.  When questioned she reports that she has not had any type of a decongestant.    HPI  See above    Past Medical History, Past Surgical History, Family history, Social History, and Medications were all reviewed with the patient today and updated as necessary.     Current Outpatient Medications   Medication Sig Dispense Refill    Handicap Placard MISC by Does not apply route 1 each 0    ELIQUIS 5 MG TABS tablet Take 1 tablet by mouth 2 times daily 180 tablet 3    metoprolol tartrate (LOPRESSOR) 50 MG tablet Take 1 tablet by mouth twice daily 180 tablet 3    solifenacin (VESICARE) 10 MG tablet Take 1 tablet by mouth once daily 30 tablet 2    telmisartan (MICARDIS) 80 MG tablet Take 1 tablet by mouth daily 30 tablet 5    potassium chloride (KLOR-CON M) 10 MEQ extended release tablet Take 1 tablet by mouth once daily 30 tablet 5    furosemide (LASIX) 40 MG tablet Take 1 tablet by mouth once  daily 30 tablet 5    simvastatin (ZOCOR) 20 MG tablet Take 1 tablet by mouth nightly 30 tablet 5    calcium carbonate (OYSTER SHELL CALCIUM 500 MG) 1250 (500 Ca) MG tablet Take 1 tablet by mouth daily      Cholecalciferol 50 MCG (2000 UT) CAPS Take 2,000 Units by mouth daily      dilTIAZem (CARDIZEM) 30 MG tablet Take 1 tablet po prn q 4h for afib. Heart rate >100       No current facility-administered medications for this visit.     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     COVID-19 vaccine series completed 04/08/2019    Moderna    GERD (gastroesophageal reflux disease)     Hypertension     Overactive bladder      Past Surgical History:  Procedure Laterality Date    APPENDECTOMY      BREAST LUMPECTOMY Right     2015    BREAST SURGERY Right 2015    cancer    KNEE ARTHROSCOPY      TUBAL LIGATION  1976     Family History   Problem Relation Age of Onset    Breast Cancer Maternal Aunt     Diabetes Paternal Grandmother     Diabetes Sister     Heart Attack Father     Hypertension Mother     High Cholesterol Mother      Social History     Tobacco Use    Smoking status: Never    Smokeless tobacco: Never   Substance Use Topics    Alcohol use: Never     Review of Systems  See above    OBJECTIVE:  BP (!) 142/78   Ht 5\' 3"  (1.6 m)   Wt 194 lb 3.2 oz (88.1 kg)   BMI 34.40 kg/m      Physical Exam  Constitutional:       General: She is not in acute distress.     Appearance: Normal appearance. She is not ill-appearing.   HENT:      Head: Normocephalic and atraumatic.   Cardiovascular:      Rate and Rhythm: Normal rate and regular rhythm.       Heart sounds: Normal heart sounds. No murmur heard.  Pulmonary:      Effort: Pulmonary effort is normal.      Breath sounds: Normal breath sounds. No wheezing or rhonchi.   Musculoskeletal:         General: Normal range of motion.      Cervical back: Normal range of motion and neck supple.      Right lower leg: No edema.      Left lower leg: No edema.   Skin:     General: Skin is warm.      Findings: No rash.   Neurological:      Mental Status: She is alert and oriented to person, place, and time.   Psychiatric:         Mood and Affect: Mood normal.         Behavior: Behavior normal.         Thought Content: Thought content normal.         Judgment: Judgment normal.     Medical problems and test results were reviewed with the patient today.     Twelve-lead EKG reveals sinus bradycardia with a ventricular rate of approximately 50.  There are no acute ischemic changes.  Axis appears to be normal.    ASSESSMENT and PLAN    1.  Hypertension.  BP 142/78.  Patient instructed to continue to monitor blood pressure and if it starting to rise consistently to let me know.  I am hesitant to make adjustments in her blood pressure medication today because historically has been controlled and I do not want to bottom her out.  Discontinue cold medicine.  She has an appointment next week with her cardiologist.    2.  Atypical chest pain.  Unclear as to whether or not this is cardiac or potentially anxiety/stress related.  EKG unremarkable.  Instructed the patient if she develops any recurrent symptoms to go straight to the emergency room.  Fortunately she has an appointment early next week with her cardiologist which she will keep.    3.  A. fib.  Currently in sinus rhythm.  Heart rates low but stable.  No syncope or presyncope reported.  Elements of this note have been dictated using speech recognition software. As a result, errors of speech recognition may have occurred.    Electronically signed by Leory Plowman, Incoming  [facility] Source Conversion at 10/20/2020  3:09 PM EDT

## 2020-09-29 ENCOUNTER — Ambulatory Visit
Admit: 2020-09-29 | Discharge: 2020-09-29 | Payer: MEDICARE | Attending: Cardiovascular Disease | Primary: Family Medicine

## 2020-09-29 ENCOUNTER — Encounter: Attending: Cardiovascular Disease | Primary: Family Medicine

## 2020-09-29 DIAGNOSIS — I480 Paroxysmal atrial fibrillation: Secondary | ICD-10-CM

## 2020-09-29 NOTE — Progress Notes (Signed)
[facility]  [address], [address]  [place] [address]  PHONE: [phone]    SUBJECTIVE: Brandy Vargas (10/08/1940) is a 80 y.o. female seen for a follow up visit regarding the following:   Specialty Problems          Cardiology Problems    Essential hypertension        Paroxysmal atrial fibrillation ([place])        Pure hyperglyceridemia         Pt doing well. No chest pain. No palpitations. Patient denies syncope. No dyspnea. States they are taking meds. Maintains a normal level of activity for them without symptoms. No dizziness or lightheadedness. All above conditions stable.  Had 1 episode approximately a week ago of feeling some fullness in her upper chest that lasted a few minutes.  I suspect it was a short run of atrial fibrillation she has had no episodes since    Past Medical History, Past Surgical History, Family history, Social History, and Medications were all reviewed with the patient today and updated as necessary.    Allergies   Allergen Reactions    Omeprazole Magnesium Itching     Past Medical History:   Diagnosis Date    Arthritis     Atrial fibrillation ([place])     Breast cancer ([place]) 2015    right     COVID-19 vaccine series completed 04/08/2019    Moderna    GERD (gastroesophageal reflux disease)     Hypertension     Overactive bladder      Past Surgical History:   Procedure Laterality Date    APPENDECTOMY      BREAST LUMPECTOMY Right     2015    BREAST SURGERY Right 2015    cancer    KNEE ARTHROSCOPY      TUBAL LIGATION  1976     Family History   Problem Relation Age of Onset    Breast Cancer Maternal Aunt     Diabetes Paternal Grandmother     Diabetes Sister     Heart Attack Father     Hypertension Mother     High Cholesterol Mother       Social History     Tobacco Use    Smoking status: Never    Smokeless tobacco: Never   Substance Use Topics    Alcohol use: Never       Current Outpatient Medications:     Handicap Placard MISC, by Does not apply route, Disp: 1 each,  Rfl: 0    ELIQUIS 5 MG TABS tablet, Take 1 tablet by mouth 2 times daily, Disp: 180 tablet, Rfl: 3    metoprolol tartrate (LOPRESSOR) 50 MG tablet, Take 1 tablet by mouth twice daily, Disp: 180 tablet, Rfl: 3    solifenacin (VESICARE) 10 MG tablet, Take 1 tablet by mouth once daily, Disp: 30 tablet, Rfl: 2    telmisartan (MICARDIS) 80 MG tablet, Take 1 tablet by mouth daily, Disp: 30 tablet, Rfl: 5    simvastatin (ZOCOR) 20 MG tablet, Take 1 tablet by mouth nightly, Disp: 30 tablet, Rfl: 5    calcium carbonate (OYSTER SHELL CALCIUM 500 MG) 1250 (500 Ca) MG tablet, Take 1 tablet by mouth daily, Disp: , Rfl:     Cholecalciferol 50 MCG (2000 UT) CAPS, Take 2,000 Units by mouth daily, Disp: , Rfl:     dilTIAZem (CARDIZEM) 30 MG tablet, Take 1 tablet po prn q 4h for afib.  Heart rate >100, Disp: , Rfl:     potassium chloride (KLOR-CON M) 10 MEQ extended release tablet, Take 1 tablet by mouth once daily (Patient not taking: Reported on 09/29/2020), Disp: 30 tablet, Rfl: 5    furosemide (LASIX) 40 MG tablet, Take 1 tablet by mouth once daily (Patient not taking: Reported on 09/29/2020), Disp: 30 tablet, Rfl: 5    ROS:  Review of Systems - General ROS: negative for - chills, fatigue or fever  Hematological and Lymphatic ROS: negative for - bleeding problems, bruising or jaundice  Respiratory ROS: no cough, shortness of breath, or wheezing  Cardiovascular ROS: no chest pain or dyspnea on exertion  Gastrointestinal ROS: no abdominal pain, change in bowel habits, or black or bloody stools  Neurological ROS: no TIA or stroke symptoms  All other systems negative.    Wt Readings from Last 3 Encounters:   09/29/20 193 lb (87.5 kg)   09/22/20 194 lb 3.2 oz (88.1 kg)   09/14/20 194 lb 3.2 oz (88.1 kg)     Temp Readings from Last 3 Encounters:   No data found for Temp     BP Readings from Last 3 Encounters:   09/29/20 (!) 142/66   09/22/20 (!) 142/78   09/14/20 124/60     Pulse Readings from Last 3 Encounters:   09/29/20 56   09/22/20  52   03/28/20 60       PHYSICAL EXAM:  BP (!) 142/66    Pulse 56    Ht 5' 3"  (1.6 m)    Wt 193 lb (87.5 kg)    BMI 34.19 kg/m??   Physical Examination: General appearance - alert, well appearing, and in no distress  Mental status - alert, oriented to person, place, and time  Eyes - pupils equal and reactive, extraocular eye movements intact  Neck/lymph - supple, no significant adenopathy  Chest/lungs - clear to auscultation, no wheezes, rales or rhonchi, symmetric air entry  Heart/CV - normal rate, regular rhythm, normal S1, S2, no murmurs, rubs, clicks or gallops  Abdomen/GI - soft, nontender, nondistended, no masses or organomegaly  Musculoskeletal - no joint tenderness, deformity or swelling  Extremities - peripheral pulses normal, no pedal edema, no clubbing or cyanosis  Skin - normal coloration and turgor, no rashes, no suspicious skin lesions noted    EKG: normal EKG, normal sinus rhythm.         Medications reviewed and questions answered    Recent Results (from the past 672 hour(s))   AMB POC KTY COMPLETE CBC    Collection Time: 09/14/20 11:00 AM   Result Value Ref Range    WBC, POC 8.4 K/uL    Lymphocyte % 23.6 %    Monocyte % 5.9 %    Granulocytes %, POC 70.5 %    Lymphs Abs 2.0 K/uL    Monocyte Absolute, POC 0.5 K/uL    Granulocytes Abs 2.9 K/uL    RBC, POC 4.65 M/uL    Hemoglobin, POC 13.9 G/DL    Hematocrit, POC 42.4 %    MCV 91.1     MCH 29.9 (A) 40 pg    MCHC 32.8     RDW, POC 13.7 %    Platelet Count, POC 187 K/UL    MPV POC 9.0 fL   TSH    Collection Time: 09/14/20  1:04 PM   Result Value Ref Range    TSH, 3RD GENERATION 1.300 0.358 - 3.740 uIU/mL   Lipid Panel  Collection Time: 09/14/20  1:04 PM   Result Value Ref Range    Cholesterol, Total 137 <200 MG/DL    Triglycerides 139 35 - 150 MG/DL    HDL 41 40 - 60 MG/DL    LDL Calculated 68.2 <100 MG/DL    VLDL Cholesterol Calculated 27.8 (H) 6.0 - 23.0 MG/DL    Chol/HDL Ratio 3.3     Comprehensive Metabolic Panel    Collection Time: 09/14/20  1:04 PM    Result Value Ref Range    Sodium 139 136 - 145 mmol/L    Potassium 4.7 3.5 - 5.1 mmol/L    Chloride 104 101 - 110 mmol/L    CO2 31 21 - 32 mmol/L    Anion Gap 4 4 - 13 mmol/L    Glucose 90 65 - 100 mg/dL    BUN 30 (H) 8 - 23 MG/DL    Creatinine 0.90 0.6 - 1.0 MG/DL    GFR African American >60 >60 ml/min/1.23m    GFR Non-African American >60 >60 ml/min/1.749m   Calcium 9.0 8.3 - 10.4 MG/DL    Total Bilirubin 0.4 0.2 - 1.1 MG/DL    ALT 20 12 - 65 U/L    AST 15 15 - 37 U/L    Alk Phosphatase 110 50 - 136 U/L    Total Protein 6.6 6.3 - 8.2 g/dL    Albumin 3.7 3.2 - 4.6 g/dL    Globulin 2.9 2.3 - 3.5 g/dL    Albumin/Globulin Ratio 1.3 1.2 - 3.5       Lab Results   Component Value Date/Time    CHOL 137 09/14/2020 01:04 PM    HDL 41 09/14/2020 01:04 PM    VLDL 19 03/08/2020 01:52 AM       ASSESSMENT and PLAN  Problem List Items Addressed This Visit          Circulatory    Paroxysmal atrial fibrillation (HCC) - Primary    Essential hypertension     Other Visit Diagnoses       Mixed hyperlipidemia               Paroxysmal A. fib  On appropriate rate control and anticoagulation continue current meds    Hypertension acceptable control continue current meds    Dyslipidemia on appropriate statin therapy continue    Continue meds as below    Current Outpatient Medications:     Handicap Placard MISC, by Does not apply route, Disp: 1 each, Rfl: 0    ELIQUIS 5 MG TABS tablet, Take 1 tablet by mouth 2 times daily, Disp: 180 tablet, Rfl: 3    metoprolol tartrate (LOPRESSOR) 50 MG tablet, Take 1 tablet by mouth twice daily, Disp: 180 tablet, Rfl: 3    solifenacin (VESICARE) 10 MG tablet, Take 1 tablet by mouth once daily, Disp: 30 tablet, Rfl: 2    telmisartan (MICARDIS) 80 MG tablet, Take 1 tablet by mouth daily, Disp: 30 tablet, Rfl: 5    simvastatin (ZOCOR) 20 MG tablet, Take 1 tablet by mouth nightly, Disp: 30 tablet, Rfl: 5    calcium carbonate (OYSTER SHELL CALCIUM 500 MG) 1250 (500 Ca) MG tablet, Take 1 tablet by mouth  daily, Disp: , Rfl:     Cholecalciferol 50 MCG (2000 UT) CAPS, Take 2,000 Units by mouth daily, Disp: , Rfl:     dilTIAZem (CARDIZEM) 30 MG tablet, Take 1 tablet po prn q 4h for afib. Heart rate >100, Disp: , Rfl:  potassium chloride (KLOR-CON M) 10 MEQ extended release tablet, Take 1 tablet by mouth once daily (Patient not taking: Reported on 09/29/2020), Disp: 30 tablet, Rfl: 5    furosemide (LASIX) 40 MG tablet, Take 1 tablet by mouth once daily (Patient not taking: Reported on 09/29/2020), Disp: 30 tablet, Rfl: [place], MD  09/29/2020  11:02 AM    Pt is instructed to follow all appropriate cardiac risk factor recommendations and to be compliant with meds and testing. Instructed to follow up appropriately and seek urgent medical care if acute or unstable issues arise. Results of some tests may be viewed thru [place] but this does not substitute for follow up with MD. If follow up is not scheduled pt is instructed to call for follow up

## 2020-10-03 MED ORDER — SIMVASTATIN 20 MG PO TABS
20 MG | ORAL_TABLET | Freq: Every evening | ORAL | 1 refills | Status: DC
Start: 2020-10-03 — End: 2021-07-26

## 2020-10-05 ENCOUNTER — Ambulatory Visit: Admit: 2020-10-05 | Discharge: 2020-10-05 | Payer: MEDICARE | Attending: Family Medicine | Primary: Family Medicine

## 2020-10-05 DIAGNOSIS — R0789 Other chest pain: Secondary | ICD-10-CM

## 2020-10-05 NOTE — Progress Notes (Signed)
SUBJECTIVE:   Brandy Vargas is a 80 y.o. female who has a past medical history significant for hypertension, high cholesterol, paroxysmal atrial fibrillation, lower extremity edema, degenerative arthritis status post hip replacement, postoperative blood loss anemia and overactive bladder.  Devious visit the patient had reported an episode of chest tightness and feeling poorly.  Prior to that visit she had complained of some fatigue and dizziness.  EKG and physical exam at last visit was unremarkable.  She has been to her cardiologist and reports that it was felt that she had a short run of A. fib.  She is completely back to normal and has no issues or complaints.    Regarding her fatigue and dizziness.  A metabolic panel, TSH and CBC were unremarkable.  Her dizziness is improved with the treatment prescribed at the time including Kenalog shot, Mucinex and Nasacort.  Please refer to prior notes for details.    HPI  See above    Past Medical History, Past Surgical History, Family history, Social History, and Medications were all reviewed with the patient today and updated as necessary.       Current Outpatient Medications   Medication Sig Dispense Refill    simvastatin (ZOCOR) 20 MG tablet Take 1 tablet by mouth nightly 90 tablet 1    Handicap Placard MISC by Does not apply route 1 each 0    ELIQUIS 5 MG TABS tablet Take 1 tablet by mouth 2 times daily 180 tablet 3    metoprolol tartrate (LOPRESSOR) 50 MG tablet Take 1 tablet by mouth twice daily 180 tablet 3    solifenacin (VESICARE) 10 MG tablet Take 1 tablet by mouth once daily 30 tablet 2    telmisartan (MICARDIS) 80 MG tablet Take 1 tablet by mouth daily 30 tablet 5    calcium carbonate (OYSTER SHELL CALCIUM 500 MG) 1250 (500 Ca) MG tablet Take 1 tablet by mouth daily      Cholecalciferol 50 MCG (2000 UT) CAPS Take 2,000 Units by mouth daily      dilTIAZem (CARDIZEM) 30 MG tablet Take 1 tablet po prn q 4h for afib. Heart rate >100       No current  facility-administered medications for this visit.     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     COVID-19 vaccine series completed 04/08/2019    Moderna    GERD (gastroesophageal reflux disease)     Hypertension     Overactive bladder      Past Surgical History:   Procedure Laterality Date    APPENDECTOMY      BREAST LUMPECTOMY Right     2015    BREAST SURGERY Right 2015    cancer    KNEE ARTHROSCOPY      TUBAL LIGATION  1976     Family History   Problem Relation Age of Onset    Breast Cancer Maternal Aunt     Diabetes Paternal Grandmother  Diabetes Sister     Heart Attack Father     Hypertension Mother     High Cholesterol Mother      Social History     Tobacco Use    Smoking status: Never    Smokeless tobacco: Never   Substance Use Topics    Alcohol use: Never         Review of Systems  See above    OBJECTIVE:  BP 136/70    Pulse 55    Ht 5\' 3"  (1.6 m)    Wt 194 lb 9.6 oz (88.3 kg)    SpO2 97%    BMI 34.47 kg/m??      Physical Exam  Constitutional:       General: She is not in acute distress.     Appearance: Normal appearance. She is not ill-appearing.   HENT:      Head: Normocephalic and atraumatic.   Cardiovascular:      Rate and Rhythm: Normal rate and regular rhythm.      Heart sounds: Normal heart sounds. No murmur heard.  Pulmonary:      Effort: Pulmonary effort is normal.      Breath sounds: Normal breath sounds. No wheezing or rhonchi.   Musculoskeletal:         General: Normal range of motion.      Cervical back: Normal range of motion and neck supple.      Right lower leg: No edema.      Left lower leg: No edema.    Skin:     General: Skin is warm.      Findings: No rash.   Neurological:      Mental Status: She is alert and oriented to person, place, and time.   Psychiatric:         Mood and Affect: Mood normal.         Behavior: Behavior normal.         Thought Content: Thought content normal.         Judgment: Judgment normal.       Medical problems and test results were reviewed with the patient today.         ASSESSMENT and PLAN    1.  Atypical chest pain.  Resolved.  Has seen cardiology.  Felt to be short run of A. fib.  If recurs she will let me or her cardiologist know.    2.  Hypertension.  BP 136/70.  Renal function and electrolytes are normal.    3.  Atrial fibrillation.  No syncope or presyncope reported.    4.  Fatigue.  Slowly improving.  Metabolic panel, TSH and CBC unremarkable.    5.  Dizziness.  Felt to be eustachian tube dysfunction.  Resolved with Nasacort, Mucinex and Kenalog shot.    6.  High cholesterol.  LDL 68.  Previously 100.  Continue statin.  Liver enzymes remain normal.    Elements of this note have been dictated using speech recognition software. As a result, errors of speech recognition may have occurred.

## 2020-10-07 MED ORDER — SOLIFENACIN SUCCINATE 10 MG PO TABS
10 MG | ORAL_TABLET | ORAL | 5 refills | Status: DC
Start: 2020-10-07 — End: 2021-05-25

## 2020-12-07 MED ORDER — TELMISARTAN 80 MG PO TABS
80 MG | ORAL_TABLET | ORAL | 5 refills | Status: AC
Start: 2020-12-07 — End: 2021-06-13

## 2021-03-15 ENCOUNTER — Ambulatory Visit: Admit: 2021-03-15 | Discharge: 2021-03-15 | Payer: MEDICARE | Attending: Family Medicine | Primary: Family Medicine

## 2021-03-15 DIAGNOSIS — Z0000 Encounter for general adult medical examination without abnormal findings: Secondary | ICD-10-CM

## 2021-03-15 LAB — LIPID PANEL
Chol/HDL Ratio: 3.5
Cholesterol, Total: 175 MG/DL (ref <200)
HDL: 50 MG/DL (ref 40–60)
LDL Calculated: 101.8 MG/DL — ABNORMAL HIGH (ref <100)
Triglycerides: 116 MG/DL (ref 35–150)
VLDL Cholesterol Calculated: 23.2 MG/DL — ABNORMAL HIGH (ref 6.0–23.0)

## 2021-03-15 LAB — COMPREHENSIVE METABOLIC PANEL
ALT: 15 U/L (ref 12–65)
AST: 16 U/L (ref 15–37)
Albumin/Globulin Ratio: 1.3 (ref 0.4–1.6)
Albumin: 4 g/dL (ref 3.2–4.6)
Alk Phosphatase: 91 U/L (ref 50–136)
Anion Gap: 2 mmol/L (ref 2–11)
BUN: 21 MG/DL (ref 8–23)
CO2: 32 mmol/L (ref 21–32)
Calcium: 9.6 MG/DL (ref 8.3–10.4)
Chloride: 102 mmol/L (ref 101–110)
Creatinine: 1 MG/DL (ref 0.6–1.0)
Est, Glom Filt Rate: 56 mL/min/{1.73_m2} — ABNORMAL LOW (ref >60)
Globulin: 3 g/dL (ref 2.8–4.5)
Glucose: 101 mg/dL — ABNORMAL HIGH (ref 65–100)
Potassium: 4.4 mmol/L (ref 3.5–5.1)
Sodium: 136 mmol/L (ref 133–143)
Total Bilirubin: 0.7 MG/DL (ref 0.2–1.1)
Total Protein: 7 g/dL (ref 6.3–8.2)

## 2021-03-15 LAB — CBC WITH AUTO DIFFERENTIAL
Absolute Eos #: 0.2 10*3/uL (ref 0.0–0.8)
Absolute Immature Granulocyte: 0 10*3/uL (ref 0.0–0.5)
Absolute Lymph #: 1.8 10*3/uL (ref 0.5–4.6)
Absolute Mono #: 0.7 10*3/uL (ref 0.1–1.3)
Basophils Absolute: 0.1 10*3/uL (ref 0.0–0.2)
Basophils: 1 % (ref 0.0–2.0)
Eosinophils %: 3 % (ref 0.5–7.8)
Hematocrit: 45.6 % (ref 35.8–46.3)
Hemoglobin: 14.6 g/dL (ref 11.7–15.4)
Immature Granulocytes: 0 % (ref 0.0–5.0)
Lymphocytes: 20 % (ref 13–44)
MCH: 28.7 PG (ref 26.1–32.9)
MCHC: 32 g/dL (ref 31.4–35.0)
MCV: 89.6 FL (ref 82–102)
MPV: 10.8 FL (ref 9.4–12.3)
Monocytes: 8 % (ref 4.0–12.0)
Platelets: 189 10*3/uL (ref 150–450)
RBC: 5.09 M/uL (ref 4.05–5.2)
RDW: 13 % (ref 11.9–14.6)
Seg Neutrophils: 68 % (ref 43–78)
Segs Absolute: 6 10*3/uL (ref 1.7–8.2)
WBC: 8.8 10*3/uL (ref 4.3–11.1)
nRBC: 0 10*3/uL (ref 0.0–0.2)

## 2021-03-15 LAB — TSH: TSH, 3RD GENERATION: 0.701 u[IU]/mL (ref 0.358–3.740)

## 2021-03-15 LAB — HEMOGLOBIN A1C
Hemoglobin A1C: 5.7 % — ABNORMAL HIGH (ref 4.8–5.6)
eAG: 117 mg/dL

## 2021-03-15 NOTE — Patient Instructions (Signed)
Learning About Being Active as an Older Adult  Why is being active important as you get older?     Being active is one of the best things you can do for your health. And it's never too late to start. Being active--or getting active, if you aren't already--has definite benefits. It can:  Give you more energy,  Keep your mind sharp.  Improve balance to reduce your risk of falls.  Help you manage chronic illness with fewer medicines.  No matter how old you are, how fit you are, or what health problems you have, there is a form of activity that will work for you. And the more physical activity you can do, the better your overall health will be.  What kinds of activity can help you stay healthy?  Being more active will make your daily activities easier. Physical activity includes planned exercise and things you do in daily life. There are four types of activity:  Aerobic.  Doing aerobic activity makes your heart and lungs strong.  Includes walking, dancing, and gardening.  Aim for at least 2?? hours spread throughout the week.  It improves your energy and can help you sleep better.  Muscle-strengthening.  This type of activity can help maintain muscle and strengthen bones.  Includes climbing stairs, using resistance bands, and lifting or carrying heavy loads.  Aim for at least twice a week.  It can help protect the knees and other joints.  Stretching.  Stretching gives you better range of motion in joints and muscles.  Includes upper arm stretches, calf stretches, and gentle yoga.  Aim for at least twice a week, preferably after your muscles are warmed up from other activities.  It can help you function better in daily life.  Balancing.  This helps you stay coordinated and have good posture.  Includes heel-to-toe walking, tai chi, and certain types of yoga.  Aim for at least 3 days a week.  It can reduce your risk of falling.  Even if you have a hard time meeting the recommendations, it's better to be more active  than less active. All activity done in each category counts toward your weekly total. You'd be surprised how daily things like carrying groceries, keeping up with grandchildren, and taking the stairs can add up.  What keeps you from being active?  If you've had a hard time being more active, you're not alone. Maybe you remember being able to do more. Or maybe you've never thought of yourself as being active. It's frustrating when you can't do the things you want. Being more active can help. What's holding you back?  Getting started.  Have a goal, but break it into easy tasks. Small steps build into big accomplishments.  Staying motivated.  If you feel like skipping your activity, remember your goal. Maybe you want to move better and stay independent. Every activity gets you one step closer.  Not feeling your best.  Start with 5 minutes of an activity you enjoy. Prove to yourself you can do it. As you get comfortable, increase your time.  You may not be where you want to be. But you're in the process of getting there. Everyone starts somewhere.  How can you find safe ways to stay active?  Talk with your doctor about any physical challenges you're facing. Make a plan with your doctor if you have a health problem or aren't sure how to get started with activity.  If you're already active, ask your doctor if  there is anything you should change to stay safe as your body and health change.  If you tend to feel dizzy after you take medicine, avoid activity at that time. Try being active before you take your medicine. This will reduce your risk of falls.  If you plan to be active at home, make sure to clear your space before you get started. Remove things like TV cords, coffee tables, and throw rugs. It's safest to have plenty of space to move freely.  The key to getting more active is to take it slow and steady. Try to improve only a little bit at a time. Pick just one area to improve on at first. And if an activity hurts,  stop and talk to your doctor.  Where can you learn more?  Go to https://www.bennett.info/ and enter P600 to learn more about "Learning About Being Active as an Older Adult."  Current as of: October 10, 2020??????????????????????????????Content Version: 13.5  ?? 2006-2022 Healthwise, Incorporated.   Care instructions adapted under license by [facility]. If you have questions about a medical condition or this instruction, always ask your healthcare professional. [place] any warranty or liability for your use of this information.           Advance Directives: Care Instructions  Overview  An advance directive is a legal way to state your wishes at the end of your life. It tells your family and your doctor what to do if you can't say what you want.  There are two main types of advance directives. You can change them any time your wishes change.  Living will.  This form tells your family and your doctor your wishes about life support and other treatment. The form is also called a declaration.  Medical power of attorney.  This form lets you name a person to make treatment decisions for you when you can't speak for yourself. This person is called a health care agent (health care proxy, health care surrogate). The form is also called a durable power of attorney for health care.  If you do not have an advance directive, decisions about your medical care may be made by a family member, or by a doctor or a judge who doesn't know you.  It may help to think of an advance directive as a gift to the people who care for you. If you have one, they won't have to make tough decisions by themselves.  For more information, including forms for your state, see the [place] website (RebankingSpace.hu).  Follow-up care is a key part of your treatment and safety. Be sure to make and go to all appointments, and call your doctor if you are having problems. It's also a good idea to know  your test results and keep a list of the medicines you take.  What should you include in an advance directive?  Many states have a unique advance directive form. (It may ask you to address specific issues.) Or you might use a universal form that's approved by many states.  If your form doesn't tell you what to address, it may be hard to know what to include in your advance directive. Use the questions below to help you get started.  Who do you want to make decisions about your medical care if you are not able to?  What life-support measures do you want if you have a serious illness that gets worse over time or can't be cured?  What are you most  afraid of that might happen? (Maybe you're afraid of having pain, losing your independence, or being kept alive by machines.)  Where would you prefer to die? (Your home? A hospital? A nursing home?)  Do you want to donate your organs when you die?  Do you want certain religious practices performed before you die?  When should you call for help?  Be sure to contact your doctor if you have any questions.  Where can you learn more?  Go to https://www.bennett.info/ and enter R264 to learn more about "Advance Directives: Care Instructions."  Current as of: June 16, 2020??????????????????????????????Content Version: 13.5  ?? 2006-2022 Healthwise, Incorporated.   Care instructions adapted under license by [facility]. If you have questions about a medical condition or this instruction, always ask your healthcare professional. [place] any warranty or liability for your use of this information.           Starting a Weight Loss Plan: Care Instructions  Overview     If you're thinking about losing weight, it can be hard to know where to start. Your doctor can help you set up a weight loss plan that best meets your needs. You may want to take a class on nutrition or exercise, or you could join a weight loss support group. If you have questions about how to make  changes to your eating or exercise habits, ask your doctor about seeing a registered dietitian or an exercise specialist.  It can be a big challenge to lose weight. But you don't have to make huge changes at once. Make small changes, and stick with them. When those changes become habit, add a few more changes.  If you don't think you're ready to make changes right now, try to pick a date in the future. Make an appointment to see your doctor to discuss whether the time is right for you to start a plan.  Follow-up care is a key part of your treatment and safety. Be sure to make and go to all appointments, and call your doctor if you are having problems. It's also a good idea to know your test results and keep a list of the medicines you take.  How can you care for yourself at home?  Set realistic goals. Many people expect to lose much more weight than is likely. A weight loss of 5% to 10% of your body weight may be enough to improve your health.  Get family and friends involved to provide support. Talk to them about why you are trying to lose weight, and ask them to help. They can help by participating in exercise and having meals with you, even if they may be eating something different.  Find what works best for you. If you do not have time or do not like to cook, a program that offers meal replacement bars or shakes may be better for you. Or if you like to prepare meals, finding a plan that includes daily menus and recipes may be best.  Ask your doctor about other health professionals who can help you achieve your weight loss goals.  A dietitian can help you make healthy changes in your diet.  An exercise specialist or personal trainer can help you develop a safe and effective exercise program.  A counselor or psychiatrist can help you cope with issues such as depression, anxiety, or family problems that can make it hard to focus on weight loss.  Consider joining a support group for people who are  trying to lose  weight. Your doctor can suggest groups in your area.  Where can you learn more?  Go to https://www.bennett.info/ and enter U357 to learn more about "Starting a Weight Loss Plan: Care Instructions."  Current as of: August 25, 2020??????????????????????????????Content Version: 13.5  ?? 2006-2022 Healthwise, Incorporated.   Care instructions adapted under license by [facility]. If you have questions about a medical condition or this instruction, always ask your healthcare professional. [place] any warranty or liability for your use of this information.           A Healthy Heart: Care Instructions  Your Care Instructions     Coronary artery disease, also called heart disease, occurs when a substance called plaque builds up in the vessels that supply oxygen-rich blood to your heart muscle. This can narrow the blood vessels and reduce blood flow. A heart attack happens when blood flow is completely blocked. A high-fat diet, smoking, and other factors increase the risk of heart disease.  Your doctor has found that you have a chance of having heart disease. You can do lots of things to keep your heart healthy. It may not be easy, but you can change your diet, exercise more, and quit smoking. These steps really work to lower your chance of heart disease.  Follow-up care is a key part of your treatment and safety. Be sure to make and go to all appointments, and call your doctor if you are having problems. It's also a good idea to know your test results and keep a list of the medicines you take.  How can you care for yourself at home?  Diet  ?? Use less salt when you cook and eat. This helps lower your blood pressure. Taste food before salting. Add only a little salt when you think you need it. With time, your taste buds will adjust to less salt.   ?? Eat fewer snack items, fast foods, canned soups, and other high-salt, high-fat, processed foods.   ?? Read food labels and try to avoid saturated and trans  fats. They increase your risk of heart disease by raising cholesterol levels.   ?? Limit the amount of solid fat-butter, margarine, and shortening-you eat. Use olive, peanut, or canola oil when you cook. Bake, broil, and steam foods instead of frying them.   ?? Eat a variety of fruit and vegetables every day. Dark green, deep orange, red, or yellow fruits and vegetables are especially good for you. Examples include spinach, carrots, peaches, and berries.   ?? Foods high in fiber can reduce your cholesterol and provide important vitamins and minerals. High-fiber foods include whole-grain cereals and breads, oatmeal, beans, brown rice, citrus fruits, and apples.   ?? Eat lean proteins. Heart-healthy proteins include seafood, lean meats and poultry, eggs, beans, peas, nuts, seeds, and soy products.   ?? Limit drinks and foods with added sugar. These include candy, desserts, and soda pop.   Lifestyle changes  ?? If your doctor recommends it, get more exercise. Walking is a good choice. Bit by bit, increase the amount you walk every day. Try for at least 30 minutes on most days of the week. You also may want to swim, bike, or do other activities.   ?? Do not smoke. If you need help quitting, talk to your doctor about stop-smoking programs and medicines. These can increase your chances of quitting for good. Quitting smoking may be the most important step you can take to protect your heart.  It is never too late to quit.   ?? Limit alcohol to 2 drinks a day for men and 1 drink a day for women. Too much alcohol can cause health problems.   ?? Manage other health problems such as diabetes, high blood pressure, and high cholesterol. If you think you may have a problem with alcohol or drug use, talk to your doctor.   Medicines  ?? Take your medicines exactly as prescribed. Call your doctor if you think you are having a problem with your medicine.   ?? If your doctor recommends aspirin, take the amount directed each day. Make sure you take  aspirin and not another kind of pain reliever, such as acetaminophen (Tylenol).   When should you call for help?   Call 911 if you have symptoms of a heart attack. These may include:  ?? Chest pain or pressure, or a strange feeling in the chest.   ?? Sweating.   ?? Shortness of breath.   ?? Pain, pressure, or a strange feeling in the back, neck, jaw, or upper belly or in one or both shoulders or arms.   ?? Lightheadedness or sudden weakness.   ?? A fast or irregular heartbeat.   After you call 911, the operator may tell you to chew 1 adult-strength or 2 to 4 low-dose aspirin. Wait for an ambulance. Do not try to drive yourself.  Watch closely for changes in your health, and be sure to contact your doctor if you have any problems.  Where can you learn more?  Go to https://www.bennett.info/ and enter F075 to learn more about "A Healthy Heart: Care Instructions."  Current as of: September 07, 2020??????????????????????????????Content Version: 13.5  ?? 2006-2022 Healthwise, Incorporated.   Care instructions adapted under license by [facility]. If you have questions about a medical condition or this instruction, always ask your healthcare professional. [place] any warranty or liability for your use of this information.      Personalized Preventive Plan for Brandy Vargas - 03/15/2021  Medicare offers a range of preventive health benefits. Some of the tests and screenings are paid in full while other may be subject to a deductible, co-insurance, and/or copay.    Some of these benefits include a comprehensive review of your medical history including lifestyle, illnesses that may run in your family, and various assessments and screenings as appropriate.    After reviewing your medical record and screening and assessments performed today your provider may have ordered immunizations, labs, imaging, and/or referrals for you.  A list of these orders (if applicable) as well as your Preventive Care list are  included within your After Visit Summary for your review.    Other Preventive Recommendations:    A preventive eye exam performed by an eye specialist is recommended every 1-2 years to screen for glaucoma; cataracts, macular degeneration, and other eye disorders.  A preventive dental visit is recommended every 6 months.  Try to get at least 150 minutes of exercise per week or 10,000 steps per day on a pedometer .  Order or download the FREE "Exercise & Physical Activity: Your Everyday Guide" from [employer]. Call (339)304-0317 or search [employer] online.  You need 1200-1500 mg of calcium and 1000-2000 IU of vitamin D per day. It is possible to meet your calcium requirement with diet alone, but a vitamin D supplement is usually necessary to meet this goal.  When exposed to the sun, use  a sunscreen that protects against both UVA and UVB radiation with an SPF of 30 or greater. Reapply every 2 to 3 hours or after sweating, drying off with a towel, or swimming.  Always wear a seat belt when traveling in a car. Always wear a helmet when riding a bicycle or motorcycle.

## 2021-03-15 NOTE — Progress Notes (Signed)
SUBJECTIVE:   Brandy Vargas is a 81 y.o. female who has a past medical history significant for hypertension, high cholesterol and paroxysmal atrial fibrillation.  Review of systems reveals no complaints of chest pain, shortness of breath, orthopnea or PND.  GI and GU review of systems is unremarkable.  Patient does report that for several weeks she has been experiencing left lateral shoulder pain.  No trauma reported.  The pain is worse when she lies on her left side.  In addition she reports she has a scaly lesion on her right anterior mid thigh that is getting bigger and itches.  She would like to see a dermatologist.  Its been present for at least 4 to 6 months.    HPI  See above    Past Medical History, Past Surgical History, Family history, Social History, and Medications were all reviewed with the patient today and updated as necessary.       Current Outpatient Medications   Medication Sig Dispense Refill    telmisartan (MICARDIS) 80 MG tablet Take 1 tablet by mouth once daily 30 tablet 5    solifenacin (VESICARE) 10 MG tablet Take 1 tablet by mouth once daily 30 tablet 5    simvastatin (ZOCOR) 20 MG tablet Take 1 tablet by mouth nightly 90 tablet 1    ELIQUIS 5 MG TABS tablet Take 1 tablet by mouth 2 times daily 180 tablet 3    metoprolol tartrate (LOPRESSOR) 50 MG tablet Take 1 tablet by mouth twice daily 180 tablet 3    calcium carbonate (OYSTER SHELL CALCIUM 500 MG) 1250 (500 Ca) MG tablet Take 1 tablet by mouth daily      Cholecalciferol 50 MCG (2000 UT) CAPS Take 2,000 Units by mouth daily      dilTIAZem (CARDIZEM) 30 MG tablet Take 1 tablet po prn q 4h for afib. Heart rate >100      Handicap Placard MISC by Does not apply route 1 each 0     No current facility-administered medications for this visit.     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     COVID-19 vaccine series completed 04/08/2019    Moderna    GERD (gastroesophageal reflux disease)     Hypertension     Overactive bladder      Past Surgical History:   Procedure Laterality Date    APPENDECTOMY      BREAST LUMPECTOMY Right     2015    BREAST SURGERY Right 2015    cancer    KNEE ARTHROSCOPY      TUBAL LIGATION  1976     Family History   Problem Relation Age of Onset    Breast Cancer Maternal Aunt     Diabetes Paternal Grandmother     Diabetes Sister     Heart Attack Father     Hypertension Mother     High Cholesterol Mother      Social History  Tobacco Use    Smoking status: Never    Smokeless tobacco: Never   Substance Use Topics    Alcohol use: Never         Review of Systems  See above    OBJECTIVE:  BP 126/78    Ht 5\' 3"  (1.6 m)    Wt 195 lb 12.8 oz (88.8 kg)    BMI 34.68 kg/m??      Physical Exam  Constitutional:       General: She is not in acute distress.     Appearance: Normal appearance. She is not ill-appearing.   HENT:      Head: Normocephalic and atraumatic.      Right Ear: Ear canal and external ear normal. There is no impacted cerumen.      Left Ear: Tympanic membrane, ear canal and external ear normal. There is no impacted cerumen.      Nose: Nose normal.      Mouth/Throat:      Mouth: Mucous membranes are moist.      Pharynx: Oropharynx is clear. No oropharyngeal exudate or posterior oropharyngeal erythema.   Eyes:      General: No scleral icterus.     Extraocular Movements: Extraocular movements intact.      Conjunctiva/sclera: Conjunctivae normal.      Pupils: Pupils are equal, round, and reactive to light.   Neck:      Vascular: No carotid bruit.   Cardiovascular:      Rate and Rhythm: Normal rate and regular rhythm.      Pulses: Normal pulses.       Heart sounds: Normal heart sounds. No murmur heard.  Pulmonary:      Effort: Pulmonary effort is normal. No respiratory distress.      Breath sounds: Normal breath sounds. No wheezing.   Abdominal:      General: Abdomen is flat. Bowel sounds are normal. There is no distension.      Palpations: Abdomen is soft. There is no mass.      Tenderness: There is no abdominal tenderness.      Hernia: No hernia is present.   Musculoskeletal:         General: No swelling, tenderness or deformity. Normal range of motion.      Cervical back: Normal range of motion and neck supple.      Right lower leg: No edema.      Left lower leg: No edema.      Comments: Examination of the patient's left shoulder reveals palpable discomfort in the some deltoid bursa.  There is full range of motion.   Lymphadenopathy:      Cervical: No cervical adenopathy.   Skin:     General: Skin is warm.      Findings: No rash.      Comments: There is a raised scaly lesion measuring approximately 0.25 cm on the right mid anterior thigh   Neurological:      General: No focal deficit present.      Mental Status: She is alert and oriented to person, place, and time.      Cranial Nerves: No cranial nerve deficit.      Motor: No weakness.      Deep Tendon Reflexes: Reflexes normal.   Psychiatric:         Mood and Affect: Mood normal.         Behavior: Behavior normal.         Thought Content: Thought content  normal.         Judgment: Judgment normal.       Medical problems and test results were reviewed with the patient today.         ASSESSMENT and PLAN    1.  Skin lesion.  Refer to dermatology at patient's request.  AK versus basal cell versus squamous cell    2.  Left shoulder pain.  Appears to be subdeltoid bursitis.  Treat with ice and Voltaren gel.  Return if symptoms persist or worsen.    3.  Hypertension.  Check metabolic panel, TSH and CBC.  BP 126/78.    4.  High cholesterol.  Check lipid panel.    5.  Paroxysmal atrial fibrillation.  No syncope or  presyncope reported.    6.  Health maintenance.  Prevnar 20 will be offered at follow-up.  Bone density will be ordered.    Elements of this note have been dictated using speech recognition software. As a result, errors of speech recognition may have occurred.

## 2021-03-15 NOTE — Progress Notes (Signed)
Medicare Annual Wellness Visit    Brandy Vargas is here for Medicare AWV    Assessment & Plan   Medicare annual wellness visit, subsequent      Recommendations for Preventive Services Due: see orders and patient instructions/AVS.  Recommended screening schedule for the next 5-10 years is provided to the patient in written form: see Patient Instructions/AVS.     Return for Medicare Annual Wellness Visit in 1 year.     Subjective   Hypertension, high cholesterol and paroxysmal atrial fibrillation    Patient's complete Health Risk Assessment and screening values have been reviewed and are found in Flowsheets. The following problems were reviewed today and where indicated follow up appointments were made and/or referrals ordered.    Positive Risk Factor Screenings with Interventions:                 Weight and Activity:  Physical Activity: Inactive    Days of Exercise per Week: 0 days    Minutes of Exercise per Session: 0 min     On average, how many days per week do you engage in moderate to strenuous exercise (like a brisk walk)?: 0 days  Have you lost any weight without trying in the past 3 months?: No  Body mass index: (!) 34.68      Inactivity Interventions:  Encourage proper diet and exercise  Obesity Interventions:  Encourage proper diet and exercise                               Objective   Vitals:    03/15/21 1107   BP: 126/78   Weight: 195 lb 12.8 oz (88.8 kg)   Height: 5\' 3"  (1.6 m)      Body mass index is 34.68 kg/m??.        General Appearance: alert and oriented to person, place and time, well developed and well- nourished, in no acute distress  Skin: warm and dry, no rash or erythema  Head: normocephalic and atraumatic  Eyes: pupils equal, round, and reactive to light, extraocular eye movements intact, conjunctivae normal  ENT: tympanic membrane, external ear and ear canal normal bilaterally, nose without deformity, nasal mucosa and turbinates normal without polyps  Neck: supple and non-tender without  mass, no thyromegaly or thyroid nodules, no cervical lymphadenopathy  Pulmonary/Chest: clear to auscultation bilaterally- no wheezes, rales or rhonchi, normal air movement, no respiratory distress  Cardiovascular: normal rate, regular rhythm, normal S1 and S2, no murmurs, rubs, clicks, or gallops, distal pulses intact, no carotid bruits  Abdomen: soft, non-tender, non-distended, normal bowel sounds, no masses or organomegaly  Extremities: no cyanosis, clubbing or edema  Musculoskeletal: normal range of motion, no joint swelling, deformity or tenderness  Neurologic: reflexes normal and symmetric, no cranial nerve deficit, gait, coordination and speech normal       Allergies   Allergen Reactions    Omeprazole Magnesium Itching     Prior to Visit Medications    Medication Sig Taking? Authorizing Provider   telmisartan (MICARDIS) 80 MG tablet Take 1 tablet by mouth once daily Yes , MD   solifenacin (VESICARE) 10 MG tablet Take 1 tablet by mouth once daily Yes Delana Meyer, MD   simvastatin (ZOCOR) 20 MG tablet Take 1 tablet by mouth nightly Yes Ngan Delana Meyer, APRN - CNP   ELIQUIS 5 MG TABS tablet Take 1 tablet by mouth 2 times daily Yes Eulas Post  Yolonda Kida, MD   metoprolol tartrate (LOPRESSOR) 50 MG tablet Take 1 tablet by mouth twice daily Yes Delana Meyer, MD   calcium carbonate (OYSTER SHELL CALCIUM 500 MG) 1250 (500 Ca) MG tablet Take 1 tablet by mouth daily Yes Ar Automatic Reconciliation   Cholecalciferol 50 MCG (2000 UT) CAPS Take 2,000 Units by mouth daily Yes Ar Automatic Reconciliation   dilTIAZem (CARDIZEM) 30 MG tablet Take 1 tablet po prn q 4h for afib. Heart rate >100 Yes Ar Automatic Reconciliation   Handicap Placard MISC by Does not apply route  Delana Meyer, MD       CareTeam (Including outside providers/suppliers regularly involved in providing care):   Patient Care Team:  Delana Meyer, MD as PCP - General  Delana Meyer, MD as PCP - Empaneled Provider     Reviewed and updated  this visit:  Tobacco   Allergies   Meds   Med Hx   Surg Hx   Soc Hx   Fam Hx             Delana Meyer, MD

## 2021-03-27 ENCOUNTER — Encounter: Payer: MEDICARE | Attending: Cardiovascular Disease | Primary: Family Medicine

## 2021-03-28 ENCOUNTER — Encounter
Admit: 2021-03-28 | Discharge: 2021-03-28 | Payer: MEDICARE | Attending: Cardiovascular Disease | Primary: Family Medicine

## 2021-03-28 DIAGNOSIS — I480 Paroxysmal atrial fibrillation: Secondary | ICD-10-CM

## 2021-03-28 NOTE — Progress Notes (Signed)
[place] [place]  [address], [address]  [place] [address]  PHONE: [phone]    SUBJECTIVE: Brandy Vargas ([birth year]) is a 81 y.o. female seen for a follow up visit regarding the following:   Specialty Problems          Cardiology Problems    Essential hypertension        Paroxysmal atrial fibrillation ([place])        Pure hyperglyceridemia         Patient is doing well with no acute complaints. Denies chest pain, SOB, palpitations. Doing well on all medications.     Past Medical History, Past Surgical History, Family history, Social History, and Medications were all reviewed with the patient today and updated as necessary.    Allergies   Allergen Reactions    Omeprazole Magnesium Itching     Past Medical History:   Diagnosis Date    Arthritis     Atrial fibrillation ([place])     Breast cancer ([place]) 2015    right     COVID-19 vaccine series completed 04/08/2019    Moderna    GERD (gastroesophageal reflux disease)     Hypertension     Overactive bladder      Past Surgical History:   Procedure Laterality Date    APPENDECTOMY      BREAST LUMPECTOMY Right     2015    BREAST SURGERY Right 2015    cancer    KNEE ARTHROSCOPY      TUBAL LIGATION  1976     Family History   Problem Relation Age of Onset    Breast Cancer Maternal Aunt     Diabetes Paternal Grandmother     Diabetes Sister     Heart Attack Father     Hypertension Mother     High Cholesterol Mother       Social History     Tobacco Use    Smoking status: Never    Smokeless tobacco: Never   Substance Use Topics    Alcohol use: Never       Current Outpatient Medications:     telmisartan (MICARDIS) 80 MG tablet, Take 1 tablet by mouth once daily, Disp: 30 tablet, Rfl: 5    solifenacin (VESICARE) 10 MG tablet, Take 1 tablet by mouth once daily, Disp: 30 tablet, Rfl: 5    simvastatin (ZOCOR) 20 MG tablet, Take 1 tablet by mouth nightly, Disp: 90 tablet, Rfl: 1    Handicap Placard MISC, by Does not apply route, Disp: 1 each, Rfl: 0    ELIQUIS 5 MG  TABS tablet, Take 1 tablet by mouth 2 times daily, Disp: 180 tablet, Rfl: 3    metoprolol tartrate (LOPRESSOR) 50 MG tablet, Take 1 tablet by mouth twice daily, Disp: 180 tablet, Rfl: 3    calcium carbonate (OYSTER SHELL CALCIUM 500 MG) 1250 (500 Ca) MG tablet, Take 1 tablet by mouth daily, Disp: , Rfl:     Cholecalciferol 50 MCG (2000 UT) CAPS, Take 2,000 Units by mouth daily, Disp: , Rfl:     dilTIAZem (CARDIZEM) 30 MG tablet, Take 1 tablet po prn q 4h for afib. Heart rate >100, Disp: , Rfl:     ROS:  Review of Systems - General ROS: negative for - chills, fatigue or fever  Hematological and Lymphatic ROS: negative for - bleeding problems, bruising or jaundice  Respiratory ROS: no cough, shortness of breath, or wheezing  Cardiovascular ROS: no chest pain  or dyspnea on exertion  Gastrointestinal ROS: no abdominal pain, change in bowel habits, or black or bloody stools  Neurological ROS: no TIA or stroke symptoms  All other systems negative.    Wt Readings from Last 3 Encounters:   03/15/21 195 lb 12.8 oz (88.8 kg)   10/05/20 194 lb 9.6 oz (88.3 kg)   09/29/20 193 lb (87.5 kg)     Temp Readings from Last 3 Encounters:   No data found for Temp     BP Readings from Last 3 Encounters:   03/15/21 126/78   10/05/20 136/70   09/29/20 (!) 142/66     Pulse Readings from Last 3 Encounters:   10/05/20 55   09/29/20 56   09/22/20 52       PHYSICAL EXAM:  There were no vitals taken for this visit.  Physical Examination: General appearance - alert, well appearing, and in no distress  Mental status - alert, oriented to person, place, and time  Eyes - pupils equal and reactive, extraocular eye movements intact  Neck/lymph - supple, no significant adenopathy  Chest/lungs - clear to auscultation, no wheezes, rales or rhonchi, symmetric air entry  Heart/CV - normal rate, regular rhythm, normal S1, S2, no murmurs, rubs, clicks or gallops  Abdomen/GI - soft, nontender, nondistended, no masses or organomegaly  Musculoskeletal - no joint  tenderness, deformity or swelling  Extremities - peripheral pulses normal, no pedal edema, no clubbing or cyanosis  Skin - normal coloration and turgor, no rashes, no suspicious skin lesions noted    EKG: normal EKG, normal sinus rhythm.         Medications reviewed and questions answered    Recent Results (from the past 672 hour(s))   TSH    Collection Time: 03/15/21 11:58 AM   Result Value Ref Range    TSH, 3RD GENERATION 0.701 0.358 - 3.740 uIU/mL   Lipid Panel    Collection Time: 03/15/21 11:58 AM   Result Value Ref Range    Cholesterol, Total 175 <200 MG/DL    Triglycerides 116 35 - 150 MG/DL    HDL 50 40 - 60 MG/DL    LDL Calculated 101.8 (H) <100 MG/DL    VLDL Cholesterol Calculated 23.2 (H) 6.0 - 23.0 MG/DL    Chol/HDL Ratio 3.5     Comprehensive Metabolic Panel    Collection Time: 03/15/21 11:58 AM   Result Value Ref Range    Sodium 136 133 - 143 mmol/L    Potassium 4.4 3.5 - 5.1 mmol/L    Chloride 102 101 - 110 mmol/L    CO2 32 21 - 32 mmol/L    Anion Gap 2 2 - 11 mmol/L    Glucose 101 (H) 65 - 100 mg/dL    BUN 21 8 - 23 MG/DL    Creatinine 1.00 0.6 - 1.0 MG/DL    Est, Glom Filt Rate 56 (L) >60 ml/min/1.65m    Calcium 9.6 8.3 - 10.4 MG/DL    Total Bilirubin 0.7 0.2 - 1.1 MG/DL    ALT 15 12 - 65 U/L    AST 16 15 - 37 U/L    Alk Phosphatase 91 50 - 136 U/L    Total Protein 7.0 6.3 - 8.2 g/dL    Albumin 4.0 3.2 - 4.6 g/dL    Globulin 3.0 2.8 - 4.5 g/dL    Albumin/Globulin Ratio 1.3 0.4 - 1.6     CBC with Auto Differential    Collection Time: 03/15/21 11:58  AM   Result Value Ref Range    WBC 8.8 4.3 - 11.1 K/uL    RBC 5.09 4.05 - 5.2 M/uL    Hemoglobin 14.6 11.7 - 15.4 g/dL    Hematocrit 45.6 35.8 - 46.3 %    MCV 89.6 82 - 102 FL    MCH 28.7 26.1 - 32.9 PG    MCHC 32.0 31.4 - 35.0 g/dL    RDW 13.0 11.9 - 14.6 %    Platelets 189 150 - 450 K/uL    MPV 10.8 9.4 - 12.3 FL    nRBC 0.00 0.0 - 0.2 K/uL    Differential Type AUTOMATED      Seg Neutrophils 68 43 - 78 %    Lymphocytes 20 13 - 44 %    Monocytes 8 4.0 -  12.0 %    Eosinophils % 3 0.5 - 7.8 %    Basophils 1 0.0 - 2.0 %    Immature Granulocytes 0 0.0 - 5.0 %    Segs Absolute 6.0 1.7 - 8.2 K/UL    Absolute Lymph # 1.8 0.5 - 4.6 K/UL    Absolute Mono # 0.7 0.1 - 1.3 K/UL    Absolute Eos # 0.2 0.0 - 0.8 K/UL    Basophils Absolute 0.1 0.0 - 0.2 K/UL    Absolute Immature Granulocyte 0.0 0.0 - 0.5 K/UL   Hemoglobin A1C    Collection Time: 03/15/21 11:58 AM   Result Value Ref Range    Hemoglobin A1C 5.7 (H) 4.8 - 5.6 %    eAG 117 mg/dL     Lab Results   Component Value Date/Time    CHOL 175 03/15/2021 11:58 AM    HDL 50 03/15/2021 11:58 AM    VLDL 19 03/08/2020 01:52 AM       ASSESSMENT and PLAN  Problem List Items Addressed This Visit    None     Paroxysmal A. fib  On appropriate rate control and anticoagulation continue current meds  Hgb 14.6    Hypertension acceptable control continue current meds 138/60.     Dyslipidemia on appropriate statin therapy continue  Simvastatin 20 mg   LDL 101.8 03/2021    TSH 0.701  A1C 5.7    Continue meds as below    Current Outpatient Medications:     telmisartan (MICARDIS) 80 MG tablet, Take 1 tablet by mouth once daily, Disp: 30 tablet, Rfl: 5    solifenacin (VESICARE) 10 MG tablet, Take 1 tablet by mouth once daily, Disp: 30 tablet, Rfl: 5    simvastatin (ZOCOR) 20 MG tablet, Take 1 tablet by mouth nightly, Disp: 90 tablet, Rfl: 1    Handicap Placard MISC, by Does not apply route, Disp: 1 each, Rfl: 0    ELIQUIS 5 MG TABS tablet, Take 1 tablet by mouth 2 times daily, Disp: 180 tablet, Rfl: 3    metoprolol tartrate (LOPRESSOR) 50 MG tablet, Take 1 tablet by mouth twice daily, Disp: 180 tablet, Rfl: 3    calcium carbonate (OYSTER SHELL CALCIUM 500 MG) 1250 (500 Ca) MG tablet, Take 1 tablet by mouth daily, Disp: , Rfl:     Cholecalciferol 50 MCG (2000 UT) CAPS, Take 2,000 Units by mouth daily, Disp: , Rfl:     dilTIAZem (CARDIZEM) 30 MG tablet, Take 1 tablet po prn q 4h for afib. Heart rate >100, Disp: , Rfl:     Darci Current  03/28/2021  11:20 AM    Pt is instructed to  follow all appropriate cardiac risk factor recommendations and to be compliant with meds and testing. Instructed to follow up appropriately and seek urgent medical care if acute or unstable issues arise. Results of some tests may be viewed thru [facility] but this does not substitute for follow up with MD. If follow up is not scheduled pt is instructed to call for follow up    I have personally seen and evaluated the patient and reviewed the students note and agree with the following assessment and plan and findings. I was present for and observed the key components of this note.  Any appropriate additions or editing of the information have been done by me.    Carlyn Reichert MD, [facility]  Cardiology

## 2021-04-05 ENCOUNTER — Inpatient Hospital Stay: Admit: 2021-04-05 | Payer: MEDICARE | Primary: Family Medicine

## 2021-04-05 DIAGNOSIS — Z780 Asymptomatic menopausal state: Secondary | ICD-10-CM

## 2021-05-03 ENCOUNTER — Telehealth: Admit: 2021-05-03 | Discharge: 2021-05-03 | Payer: MEDICARE | Attending: Family Medicine | Primary: Family Medicine

## 2021-05-03 DIAGNOSIS — E7800 Pure hypercholesterolemia, unspecified: Secondary | ICD-10-CM

## 2021-05-03 NOTE — Progress Notes (Signed)
SUBJECTIVE:   Brandy Vargas is a 81 y.o. female who presents today for virtual visit via telephone encounter.  Patient has a past medical history significant for hypertension, high cholesterol and paroxysmal atrial fibrillation.  Review of systems reveals no complaints of chest pain, shortness of breath, orthopnea or PND.  GI and GU review of systems is unremarkable.      Patient's vital signs were not performed as encounter was performed virtually.  Location of virtual visit was patient's home.      HPI  See above    Past Medical History, Past Surgical History, Family history, Social History, and Medications were all reviewed with the patient today and updated as necessary.       Current Outpatient Medications   Medication Sig Dispense Refill    telmisartan (MICARDIS) 80 MG tablet Take 1 tablet by mouth once daily 30 tablet 5    solifenacin (VESICARE) 10 MG tablet Take 1 tablet by mouth once daily 30 tablet 5    simvastatin (ZOCOR) 20 MG tablet Take 1 tablet by mouth nightly 90 tablet 1    ELIQUIS 5 MG TABS tablet Take 1 tablet by mouth 2 times daily 180 tablet 3    metoprolol tartrate (LOPRESSOR) 50 MG tablet Take 1 tablet by mouth twice daily 180 tablet 3    calcium carbonate (OYSTER SHELL CALCIUM 500 MG) 1250 (500 Ca) MG tablet Take 1 tablet by mouth daily      Cholecalciferol 50 MCG (2000 UT) CAPS Take 2,000 Units by mouth daily      dilTIAZem (CARDIZEM) 30 MG tablet Take 1 tablet po prn q 4h for afib. Heart rate >100      Handicap Placard MISC by Does not apply route 1 each 0     No current facility-administered medications for this visit.     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     COVID-19 vaccine series completed 04/08/2019    Moderna    GERD (gastroesophageal reflux disease)     Hypertension     Overactive bladder      Past Surgical History:   Procedure Laterality Date    APPENDECTOMY      BREAST LUMPECTOMY Right     2015    BREAST SURGERY Right 2015    cancer    KNEE ARTHROSCOPY      TUBAL LIGATION  1976     Family History   Problem Relation Age of Onset    Breast Cancer Maternal Aunt     Diabetes Paternal Grandmother     Diabetes Sister     Heart Attack Father     Hypertension Mother     High Cholesterol Mother      Social History     Tobacco Use    Smoking status: Never    Smokeless tobacco: Never   Substance Use Topics    Alcohol use: Never  Review of Systems  See above    OBJECTIVE:  There were no vitals taken for this visit.     Physical Exam    Medical problems and test results were reviewed with the patient today.         ASSESSMENT and PLAN    1.  High cholesterol.  LDL is 101.  Continue Zocor.  Liver enzymes remain normal.    2.  Atrial fibrillation.  No syncope or presyncope reported.  Continue anticoagulation.    3.  Hypertension.  Last BP check 138/60.  Continue current therapy and monitor outside the office.  Renal function normal.    4.  Menopause.  Recent bone density showed osteopenia.    5.  Osteopenia.  Discussed with the patient the importance of appropriate calcium and vitamin D supplementation.  Rescan in 2 years.    6.  Elevated blood sugar.  A1c is 5.7.  Continue dietary focus.          Consent:    Brandy Vargas was evaluated through a synchronous (real-time) audio encounter. Patient identification was verified at the start of the visit. She (or guardian if applicable) is aware that this is a billable service, which includes applicable co-pays. This visit was conducted with the patient's (and/or legal guardian's) verbal consent. She has not had a  related appointment within my department in the past 7 days or scheduled within the next 24 hours.   The patient was located at Home: [address]  [place] [address].  The provider was located at Facility (Appt Dept): [address]  [place],  [place] [address].    Note: not billable if this call serves to triage the patient into an appointment for the relevant concern    On this date 05/03/2021 I have spent 21 minutes reviewing previous notes, test results and face to face with the patient discussing the diagnosis and importance of compliance with the treatment plan as well as documenting on the day of the visit. Greater than 50% of this visit was spent counseling the patient about test results, prognosis, importance of compliance, education about disease process, benefits of medications, instructions for management of acute symptoms, and follow up plans.    --Brandy Meyer, MD on 05/03/2021 at 1:30 PM    An electronic signature was used to authenticate this note.

## 2021-05-25 MED ORDER — SOLIFENACIN SUCCINATE 10 MG PO TABS
10 MG | ORAL_TABLET | ORAL | 5 refills | Status: AC
Start: 2021-05-25 — End: 2021-12-15

## 2021-06-13 MED ORDER — TELMISARTAN 80 MG PO TABS
80 MG | ORAL_TABLET | ORAL | 5 refills | Status: AC
Start: 2021-06-13 — End: 2021-11-13

## 2021-07-20 MED ORDER — METOPROLOL TARTRATE 50 MG PO TABS
50 MG | ORAL_TABLET | ORAL | 3 refills | Status: AC
Start: 2021-07-20 — End: 2022-03-20

## 2021-07-26 MED ORDER — SIMVASTATIN 20 MG PO TABS
20 MG | ORAL_TABLET | Freq: Every evening | ORAL | 1 refills | Status: DC
Start: 2021-07-26 — End: 2021-11-13

## 2021-09-25 MED ORDER — ELIQUIS 5 MG PO TABS
5 MG | ORAL_TABLET | Freq: Two times a day (BID) | ORAL | 3 refills | Status: AC
Start: 2021-09-25 — End: ?

## 2021-09-25 NOTE — Telephone Encounter (Signed)
Requested Prescriptions     Pending Prescriptions Disp Refills    ELIQUIS 5 MG TABS tablet [Pharmacy Med Name: Eliquis 5 MG Oral Tablet] 180 tablet 3     Sig: Take 1 tablet by mouth twice daily

## 2021-09-27 NOTE — Telephone Encounter (Signed)
done

## 2021-10-04 ENCOUNTER — Ambulatory Visit
Admit: 2021-10-04 | Discharge: 2021-10-04 | Payer: MEDICARE | Attending: Cardiovascular Disease | Primary: Family Medicine

## 2021-10-04 DIAGNOSIS — I480 Paroxysmal atrial fibrillation: Secondary | ICD-10-CM

## 2021-10-04 NOTE — Progress Notes (Signed)
[facility]  [address], [address]  [place], [place] [address]  PHONE: [phone]    SUBJECTIVE: Brandy Vargas (November 21, 1940) is a 81 y.o. female seen for a follow up visit regarding the following:   Specialty Problems          Cardiology Problems    Essential hypertension        Paroxysmal atrial fibrillation (HCC)        Pure hyperglyceridemia         Occasional A-fib episodes she is rate control anticoagulation paradigm and overall doing acceptable.  She seems to have episodes several times a month that are at least moderately inconvenient and cause symptoms for several hours after they resolve these occur mostly in the morning.  She is going to keep a log of her episodes in the meantime we will offer appropriate evaluation in case we need to make electrophysiology referral    Pt exhibits symptoms of possible sleep apnea. Including  some or all of the following. Daytime fatigue somnolence and altered sleep with snoring.         Past Medical History, Past Surgical History, Family history, Social History, and Medications were all reviewed with the patient today and updated as necessary.    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     COVID-19 vaccine series completed 04/08/2019    Moderna    GERD (gastroesophageal reflux disease)     Hypertension     Overactive bladder      Past Surgical History:   Procedure Laterality Date    APPENDECTOMY      BREAST LUMPECTOMY Right     2015    BREAST SURGERY Right 2015    cancer    KNEE ARTHROSCOPY      TUBAL LIGATION  1976     Family History   Problem Relation Age of Onset    Breast Cancer Maternal Aunt     Diabetes Paternal Grandmother     Diabetes Sister     Heart Attack Father     Hypertension Mother     High Cholesterol Mother       Social History     Tobacco Use    Smoking status: Never    Smokeless tobacco:  Never   Substance Use Topics    Alcohol use: Never       Current Outpatient Medications:     ELIQUIS 5 MG TABS tablet, Take 1 tablet by mouth twice daily, Disp: 180 tablet, Rfl: 3    simvastatin (ZOCOR) 20 MG tablet, Take 1 tablet by mouth nightly, Disp: 90 tablet, Rfl: 1    metoprolol tartrate (LOPRESSOR) 50 MG tablet, Take 1 tablet by mouth twice daily, Disp: 180 tablet, Rfl: 3    telmisartan (MICARDIS) 80 MG tablet, Take 1 tablet by mouth once daily, Disp: 30 tablet, Rfl: 5    solifenacin (VESICARE) 10 MG tablet, Take 1 tablet by mouth once daily, Disp: 30 tablet, Rfl: 5    Handicap Placard MISC, by Does not apply route, Disp: 1 each, Rfl: 0    calcium carbonate (OYSTER SHELL CALCIUM 500 MG) 1250 (500 Ca) MG tablet, Take 1 tablet by mouth daily, Disp: , Rfl:     Cholecalciferol 50 MCG (2000 UT)  CAPS, Take 1 capsule by mouth daily, Disp: , Rfl:     dilTIAZem (CARDIZEM) 30 MG tablet, Take 1 tablet po prn q 4h for afib. Heart rate >100, Disp: , Rfl:     ROS:  Review of Systems - General ROS: negative for - chills, fatigue or fever  Hematological and Lymphatic ROS: negative for - bleeding problems, bruising or jaundice  Respiratory ROS: no cough, shortness of breath, or wheezing  Cardiovascular ROS: no chest pain or dyspnea on exertion  Gastrointestinal ROS: no abdominal pain, change in bowel habits, or black or bloody stools  Neurological ROS: no TIA or stroke symptoms  All other systems negative.    Wt Readings from Last 3 Encounters:   10/04/21 189 lb 8 oz (86 kg)   03/28/21 195 lb (88.5 kg)   03/15/21 195 lb 12.8 oz (88.8 kg)     Temp Readings from Last 3 Encounters:   No data found for Temp     BP Readings from Last 3 Encounters:   10/04/21 (!) 136/58   03/28/21 138/60   03/15/21 126/78     Pulse Readings from Last 3 Encounters:   10/04/21 62   03/28/21 52   10/05/20 55       PHYSICAL EXAM:  BP (!) 136/58   Pulse 62   Ht 5\' 3"  (1.6 m)   Wt 189 lb 8 oz (86 kg)   BMI 33.57 kg/m   Physical Examination: General  appearance - alert, well appearing, and in no distress  Mental status - alert, oriented to person, place, and time  Eyes - pupils equal and reactive, extraocular eye movements intact  Neck/lymph - supple, no significant adenopathy  Chest/lungs - clear to auscultation, no wheezes, rales or rhonchi, symmetric air entry  Heart/CV - normal rate, regular rhythm, normal S1, S2, no murmurs, rubs, clicks or gallops  Abdomen/GI - soft, nontender, nondistended, no masses or organomegaly  Musculoskeletal - no joint tenderness, deformity or swelling  Extremities - peripheral pulses normal, no pedal edema, no clubbing or cyanosis  Skin - normal coloration and turgor, no rashes, no suspicious skin lesions noted    EKG: normal EKG, normal sinus rhythm.         Medications reviewed and questions answered    No results found for this or any previous visit (from the past 672 hour(s)).  Lab Results   Component Value Date/Time    CHOL 175 03/15/2021 11:58 AM    HDL 50 03/15/2021 11:58 AM    VLDL 19 03/08/2020 01:52 AM       ASSESSMENT and PLAN  Problem List Items Addressed This Visit          Circulatory    Paroxysmal atrial fibrillation (HCC) - Primary    Relevant Orders    EKG 12 Lead (Completed)    Essential hypertension    Relevant Orders    EKG 12 Lead (Completed)       Other    Secondary hypercoagulable state (HCC)     Other Visit Diagnoses       Mixed hyperlipidemia               Paroxysmal A-fib  Sounds as though she may have enough episodes and symptomatic enough episodes that at least starting the work-up for possible ablation is reasonable we will get sleep study echo and lab work.  Her early morning episodes of A-fib are worrisome for undiagnosed OSA    Blood pressure  Well-controlled continue  meds    Hyperlipidemia  Continue meds    Continue meds as below    Current Outpatient Medications:     ELIQUIS 5 MG TABS tablet, Take 1 tablet by mouth twice daily, Disp: 180 tablet, Rfl: 3    simvastatin (ZOCOR) 20 MG tablet, Take 1  tablet by mouth nightly, Disp: 90 tablet, Rfl: 1    metoprolol tartrate (LOPRESSOR) 50 MG tablet, Take 1 tablet by mouth twice daily, Disp: 180 tablet, Rfl: 3    telmisartan (MICARDIS) 80 MG tablet, Take 1 tablet by mouth once daily, Disp: 30 tablet, Rfl: 5    solifenacin (VESICARE) 10 MG tablet, Take 1 tablet by mouth once daily, Disp: 30 tablet, Rfl: 5    Handicap Placard MISC, by Does not apply route, Disp: 1 each, Rfl: 0    calcium carbonate (OYSTER SHELL CALCIUM 500 MG) 1250 (500 Ca) MG tablet, Take 1 tablet by mouth daily, Disp: , Rfl:     Cholecalciferol 50 MCG (2000 UT) CAPS, Take 1 capsule by mouth daily, Disp: , Rfl:     dilTIAZem (CARDIZEM) 30 MG tablet, Take 1 tablet po prn q 4h for afib. Heart rate >100, Disp: , Rfl:     Jens Som, MD  10/04/2021  11:45 AM    Pt is instructed to follow all appropriate cardiac risk factor recommendations and to be compliant with meds and testing. Instructed to follow up appropriately and seek urgent medical care if acute or unstable issues arise. Results of some tests may be viewed thru [place] but this does not substitute for follow up with MD. If follow up is not scheduled pt is instructed to call for follow up.  Any non cardiac issues identified in this visit the patient is counseled to address these with their primary care physician or appropriate specialist.    Jens Som, MD

## 2021-11-03 ENCOUNTER — Ambulatory Visit: Admit: 2021-11-03 | Payer: MEDICARE | Primary: Family Medicine

## 2021-11-03 DIAGNOSIS — I480 Paroxysmal atrial fibrillation: Secondary | ICD-10-CM

## 2021-11-06 ENCOUNTER — Encounter: Admit: 2021-11-06 | Discharge: 2021-11-06 | Payer: MEDICARE | Primary: Family Medicine

## 2021-11-06 DIAGNOSIS — E7800 Pure hypercholesterolemia, unspecified: Secondary | ICD-10-CM

## 2021-11-06 LAB — ECHO (TTE) COMPLETE (PRN CONTRAST/BUBBLE/STRAIN/3D)
AV Area by Peak Velocity: 1.6 cm2
AV Area by VTI: 1.6 cm2
AV Mean Gradient: 12 mmHg
AV Mean Velocity: 1.6 m/s
AV Peak Gradient: 23 mmHg
AV Peak Velocity: 2.4 m/s
AV VTI: 55.6 cm
AV Velocity Ratio: 0.46
AVA/BSA Peak Velocity: 0.8 cm2/m2
AVA/BSA VTI: 0.8 cm2/m2
Ao Root Index: 1.75 cm/m2
Aortic Root: 3.3 cm
Body Surface Area: 1.95 m2
EF BP: 67 % (ref 55–100)
Est. RA Pressure: 8 mmHg
Fractional Shortening 2D: 49 % (ref 28–44)
IVSd: 1 cm — AB (ref 0.6–0.9)
LA Area 2C: 23.6 cm2
LA Area 4C: 21.6 cm2
LA Diameter: 4.7 cm
LA Major Axis: 5.4 cm
LA Minor Axis: 5.2 cm
LA Size Index: 2.49 cm/m2
LA Volume A-L A4C: 70 mL — AB (ref 22–52)
LA Volume A-L A4C: 87 mL — AB (ref 22–52)
LA Volume BP: 80 mL — AB (ref 22–52)
LA Volume Index A-L A2C: 46 mL/m2 — AB (ref 16–34)
LA Volume Index A-L A4C: 37 mL/m2 — AB (ref 16–34)
LA Volume Index BP: 42 ml/m2 — AB (ref 16–34)
LA/AO Root Ratio: 1.42
LV E' Lateral Velocity: 7 cm/s
LV E' Septal Velocity: 6 cm/s
LV EDV A2C: 110 mL
LV EDV A4C: 83 mL
LV EDV Index A2C: 58 mL/m2
LV EDV Index A4C: 44 mL/m2
LV ESV A2C: 40 mL
LV ESV A4C: 25 mL
LV ESV Index A2C: 21 mL/m2
LV ESV Index A4C: 13 mL/m2
LV Ejection Fraction A2C: 64 %
LV Ejection Fraction A4C: 70 %
LV Mass 2D Index: 87 g/m2 (ref 43–95)
LV Mass 2D: 164.5 g — AB (ref 67–162)
LV RWT Ratio: 0.43
LVIDd Index: 2.49 cm/m2
LVIDd: 4.7 cm (ref 3.9–5.3)
LVIDs Index: 1.27 cm/m2
LVIDs: 2.4 cm
LVOT Area: 3.1 cm2
LVOT Diameter: 2 cm
LVOT Mean Gradient: 3 mmHg
LVOT Peak Gradient: 5 mmHg
LVOT Peak Velocity: 1.1 m/s
LVOT SV: 94.8 ml
LVOT Stroke Volume Index: 50.2 mL/m2
LVOT VTI: 30.2 cm
LVOT:AV VTI Index: 0.54
LVPWd: 1 cm — AB (ref 0.6–0.9)
RV Basal Dimension: 3.2 cm
RV Mid Dimension: 3 cm
RVIDd: 3.9 cm
RVSP: 38 mmHg
TR Max Velocity: 2.73 m/s
TR Peak Gradient: 30 mmHg

## 2021-11-06 LAB — COMPREHENSIVE METABOLIC PANEL
ALT: 18 U/L (ref 12–65)
AST: 15 U/L (ref 15–37)
Albumin/Globulin Ratio: 1.4 (ref 0.4–1.6)
Albumin: 3.8 g/dL (ref 3.2–4.6)
Alk Phosphatase: 87 U/L (ref 50–136)
Anion Gap: 6 mmol/L (ref 2–11)
BUN: 20 MG/DL (ref 8–23)
CO2: 29 mmol/L (ref 21–32)
Calcium: 8.9 MG/DL (ref 8.3–10.4)
Chloride: 104 mmol/L (ref 101–110)
Creatinine: 1 MG/DL (ref 0.6–1.0)
Est, Glom Filt Rate: 56 mL/min/{1.73_m2} — ABNORMAL LOW (ref >60)
Globulin: 2.8 g/dL (ref 2.8–4.5)
Glucose: 91 mg/dL (ref 65–100)
Potassium: 4.4 mmol/L (ref 3.5–5.1)
Sodium: 139 mmol/L (ref 133–143)
Total Bilirubin: 0.5 MG/DL (ref 0.2–1.1)
Total Protein: 6.6 g/dL (ref 6.3–8.2)

## 2021-11-06 LAB — LIPID PANEL
Chol/HDL Ratio: 3.5
Cholesterol, Total: 171 MG/DL (ref <200)
HDL: 49 MG/DL (ref 40–60)
LDL Calculated: 101.8 MG/DL — ABNORMAL HIGH (ref <100)
Triglycerides: 101 MG/DL (ref 35–150)
VLDL Cholesterol Calculated: 20.2 MG/DL (ref 6.0–23.0)

## 2021-11-08 ENCOUNTER — Ambulatory Visit
Admit: 2021-11-08 | Discharge: 2021-11-08 | Payer: MEDICARE | Attending: Cardiovascular Disease | Primary: Family Medicine

## 2021-11-08 DIAGNOSIS — I480 Paroxysmal atrial fibrillation: Secondary | ICD-10-CM

## 2021-11-08 NOTE — Progress Notes (Signed)
[facility]  [address], [address]  [place] [address]  PHONE: [phone]    SUBJECTIVE: Brandy Vargas (1940-06-10) is a 81 y.o. female seen for a follow up visit regarding the following:   Specialty Problems          Cardiology Problems    Essential hypertension        Paroxysmal atrial fibrillation ([place])        Pure hyperglyceridemia         Occasional A-fib episodes she is rate control anticoagulation paradigm and overall doing acceptable.  She seems to have episodes several times a month that are at least moderately inconvenient and cause symptoms for several hours after they resolve these occur mostly in the morning.  She is going to keep a log of her episodes in the meantime we will offer appropriate evaluation in case we need to make electrophysiology referral    Pt exhibits symptoms of possible sleep apnea. Including  some or all of the following. Daytime fatigue somnolence and altered sleep with snoring.     11/08/21  Patient relates she has had no further A-fib episodes since our visit previously    Appears as though lab work orders are still active and pending  Echocardiogram shows ejection fraction 65% normal wall motion some aortic valve sclerosis mean gradient 12 mmHg peak gradient 23 mmHg minimal aortic stenosis present she does have elevated RVSP of approximately 40 mmHg consistent with mild pulmonary hypertension    Patient did struggle with medication compliance prior to our most recent visit but she has now had a renewed emphasis on taking her medications appropriately specifically her morning doses and has had no more A-fib events.  We will continue with medical therapy.  She politely declines the sleep study.  We will follow-up in 6 months          Past Medical History, Past Surgical History, Family history, Social History, and Medications were all reviewed with the patient today and updated as necessary.    Allergies   Allergen Reactions    Omeprazole Magnesium  Itching     Past Medical History:   Diagnosis Date    Arthritis     Atrial fibrillation ([place])     Breast cancer ([place]) 2015    right     COVID-19 vaccine series completed 04/08/2019    Moderna    GERD (gastroesophageal reflux disease)     Hypertension     Overactive bladder      Past Surgical History:   Procedure Laterality Date    APPENDECTOMY      BREAST LUMPECTOMY Right     2015    BREAST SURGERY Right 2015    cancer    KNEE ARTHROSCOPY      TUBAL LIGATION  1976     Family History   Problem Relation Age of Onset    Breast Cancer Maternal Aunt     Diabetes Paternal Grandmother     Diabetes Sister     Heart Attack Father     Hypertension Mother     High Cholesterol Mother       Social History     Tobacco Use    Smoking status: Never    Smokeless tobacco: Never   Substance Use Topics    Alcohol use: Never       Current Outpatient Medications:     ELIQUIS 5 MG TABS tablet, Take 1 tablet by mouth twice daily, Disp:  180 tablet, Rfl: 3    simvastatin (ZOCOR) 20 MG tablet, Take 1 tablet by mouth nightly, Disp: 90 tablet, Rfl: 1    metoprolol tartrate (LOPRESSOR) 50 MG tablet, Take 1 tablet by mouth twice daily, Disp: 180 tablet, Rfl: 3    telmisartan (MICARDIS) 80 MG tablet, Take 1 tablet by mouth once daily, Disp: 30 tablet, Rfl: 5    solifenacin (VESICARE) 10 MG tablet, Take 1 tablet by mouth once daily, Disp: 30 tablet, Rfl: 5    Handicap Placard MISC, by Does not apply route, Disp: 1 each, Rfl: 0    calcium carbonate (OYSTER SHELL CALCIUM 500 MG) 1250 (500 Ca) MG tablet, Take 1 tablet by mouth daily, Disp: , Rfl:     Cholecalciferol 50 MCG (2000 UT) CAPS, Take 1 capsule by mouth daily, Disp: , Rfl:     dilTIAZem (CARDIZEM) 30 MG tablet, Take 1 tablet po prn q 4h for afib. Heart rate >100, Disp: , Rfl:     ROS:  Review of Systems - General ROS: negative for - chills, fatigue or fever  Hematological and Lymphatic ROS: negative for - bleeding problems, bruising or jaundice  Respiratory ROS: no cough, shortness of breath,  or wheezing  Cardiovascular ROS: no chest pain or dyspnea on exertion  Gastrointestinal ROS: no abdominal pain, change in bowel habits, or black or bloody stools  Neurological ROS: no TIA or stroke symptoms  All other systems negative.    Wt Readings from Last 3 Encounters:   11/08/21 85.7 kg (189 lb)   11/03/21 85.7 kg (189 lb)   10/04/21 86 kg (189 lb 8 oz)     Temp Readings from Last 3 Encounters:   No data found for Temp     BP Readings from Last 3 Encounters:   11/08/21 120/68   11/03/21 (!) 135/58   10/04/21 (!) 136/58     Pulse Readings from Last 3 Encounters:   11/08/21 51   10/04/21 62   03/28/21 52       PHYSICAL EXAM:  BP 120/68   Pulse 51   Ht 1.6 m (5' 3")   Wt 85.7 kg (189 lb)   BMI 33.48 kg/m   Physical Examination: General appearance - alert, well appearing, and in no distress  Mental status - alert, oriented to person, place, and time  Eyes - pupils equal and reactive, extraocular eye movements intact  Neck/lymph - supple, no significant adenopathy  Chest/lungs - clear to auscultation, no wheezes, rales or rhonchi, symmetric air entry  Heart/CV - normal rate, regular rhythm, normal S1, S2, no murmurs, rubs, clicks or gallops  Abdomen/GI - soft, nontender, nondistended, no masses or organomegaly  Musculoskeletal - no joint tenderness, deformity or swelling  Extremities - peripheral pulses normal, no pedal edema, no clubbing or cyanosis  Skin - normal coloration and turgor, no rashes, no suspicious skin lesions noted    EKG: normal EKG, normal sinus rhythm.         Medications reviewed and questions answered    Recent Results (from the past 672 hour(s))   Echo (TTE) complete (PRN contrast/bubble/strain/3D)    Collection Time: 11/03/21  3:39 PM   Result Value Ref Range    LV EDV A2C 110 mL    LV EDV A4C 83 mL    LV ESV A2C 40 mL    LV ESV A4C 25 mL    IVSd 1.0 (A) 0.6 - 0.9 cm    LVIDd 4.7 3.9 - 5.3  cm    LVIDs 2.4 cm    LVOT Diameter 2.0 cm    LVOT Mean Gradient 3 mmHg    LVOT VTI 30.2 cm    LVOT  Peak Velocity 1.1 m/s    LVOT Peak Gradient 5 mmHg    LVPWd 1.0 (A) 0.6 - 0.9 cm    LV E' Lateral Velocity 7 cm/s    LV E' Septal Velocity 6 cm/s    LV Ejection Fraction A2C 64 %    LV Ejection Fraction A4C 70 %    EF BP 67 55 - 100 %    LVOT Area 3.1 cm2    LVOT SV 94.8 ml    LA Minor Axis 5.2 cm    LA Major Axis 5.4 cm    LA Area 2C 23.6 cm2    LA Area 4C 21.6 cm2    LA Volume 2C 87 (A) 22 - 52 mL    LA Volume 4C 70 (A) 22 - 52 mL    LA Volume BP 80 (A) 22 - 52 mL    LA Diameter 4.7 cm    AV Mean Gradient 12 mmHg    AV VTI 55.6 cm    AV Mean Velocity 1.6 m/s    AV Peak Velocity 2.4 m/s    AV Peak Gradient 23 mmHg    AV Area by VTI 1.6 cm2    AV Area by Peak Velocity 1.6 cm2    Aortic Root 3.3 cm    RVIDd 3.9 cm    TR Max Velocity 2.73 m/s    TR Peak Gradient 30 mmHg    Body Surface Area 1.95 m2    Fractional Shortening 2D 49 28 - 44 %    LV ESV Index A4C 13 mL/m2    LV EDV Index A4C 44 mL/m2    LV ESV Index A2C 21 mL/m2    LV EDV Index A2C 58 mL/m2    LVIDd Index 2.49 cm/m2    LVIDs Index 1.27 cm/m2    LV RWT Ratio 0.43     LV Mass 2D 164.5 (A) 67 - 162 g    LV Mass 2D Index 87.0 43 - 95 g/m2    LA Volume Index BP 42 (A) 16 - 34 ml/m2    LVOT Stroke Volume Index 50.2 mL/m2    LA Volume Index 2C 46 (A) 16 - 34 mL/m2    LA Volume Index 4C 37 (A) 16 - 34 mL/m2    LA Size Index 2.49 cm/m2    LA/AO Root Ratio 1.42     Ao Root Index 1.75 cm/m2    AV Velocity Ratio 0.46     LVOT:AV VTI Index 0.54     AVA/BSA VTI 0.8 cm2/m2    AVA/BSA Peak Velocity 0.8 cm2/m2    RV Basal Dimension 3.2 cm    RV Mid Dimension 3.0 cm    Est. RA Pressure 8 mmHg    RVSP 38 mmHg   Lipid Panel    Collection Time: 11/06/21  9:04 AM   Result Value Ref Range    Cholesterol, Total 171 <200 MG/DL    Triglycerides 101 35 - 150 MG/DL    HDL 49 40 - 60 MG/DL    LDL Calculated 101.8 (H) <100 MG/DL    VLDL Cholesterol Calculated 20.2 6.0 - 23.0 MG/DL    Chol/HDL Ratio 3.5     Comprehensive Metabolic Panel    Collection Time: 11/06/21  9:04 AM  Result  Value Ref Range    Sodium 139 133 - 143 mmol/L    Potassium 4.4 3.5 - 5.1 mmol/L    Chloride 104 101 - 110 mmol/L    CO2 29 21 - 32 mmol/L    Anion Gap 6 2 - 11 mmol/L    Glucose 91 65 - 100 mg/dL    BUN 20 8 - 23 MG/DL    Creatinine 1.00 0.6 - 1.0 MG/DL    Est, Glom Filt Rate 56 (L) >60 ml/min/1.72m    Calcium 8.9 8.3 - 10.4 MG/DL    Total Bilirubin 0.5 0.2 - 1.1 MG/DL    ALT 18 12 - 65 U/L    AST 15 15 - 37 U/L    Alk Phosphatase 87 50 - 136 U/L    Total Protein 6.6 6.3 - 8.2 g/dL    Albumin 3.8 3.2 - 4.6 g/dL    Globulin 2.8 2.8 - 4.5 g/dL    Albumin/Globulin Ratio 1.4 0.4 - 1.6       Lab Results   Component Value Date/Time    CHOL 171 11/06/2021 09:04 AM    HDL 49 11/06/2021 09:04 AM    VLDL 19 03/08/2020 01:52 AM       ASSESSMENT and PLAN  Problem List Items Addressed This Visit          Circulatory    Paroxysmal atrial fibrillation (HCC) - Primary    Essential hypertension       Genitourinary    Chronic renal disease, stage III ([place] [[505697]      Other    Secondary hypercoagulable state ([facility]     Other Visit Diagnoses       Mixed hyperlipidemia               Paroxysmal A-fib  Sounds as though she may have enough episodes and symptomatic enough episodes that at least starting the work-up for possible ablation is reasonable we will get sleep study echo and lab work.  Her early morning episodes of A-fib are worrisome for undiagnosed OSA    Blood pressure  Well-controlled continue meds    Hyperlipidemia  Continue meds    Continue meds as below    Current Outpatient Medications:     ELIQUIS 5 MG TABS tablet, Take 1 tablet by mouth twice daily, Disp: 180 tablet, Rfl: 3    simvastatin (ZOCOR) 20 MG tablet, Take 1 tablet by mouth nightly, Disp: 90 tablet, Rfl: 1    metoprolol tartrate (LOPRESSOR) 50 MG tablet, Take 1 tablet by mouth twice daily, Disp: 180 tablet, Rfl: 3    telmisartan (MICARDIS) 80 MG tablet, Take 1 tablet by mouth once daily, Disp: 30 tablet, Rfl: 5    solifenacin (VESICARE) 10 MG tablet, Take 1  tablet by mouth once daily, Disp: 30 tablet, Rfl: 5    Handicap Placard MISC, by Does not apply route, Disp: 1 each, Rfl: 0    calcium carbonate (OYSTER SHELL CALCIUM 500 MG) 1250 (500 Ca) MG tablet, Take 1 tablet by mouth daily, Disp: , Rfl:     Cholecalciferol 50 MCG (2000 UT) CAPS, Take 1 capsule by mouth daily, Disp: , Rfl:     dilTIAZem (CARDIZEM) 30 MG tablet, Take 1 tablet po prn q 4h for afib. Heart rate >100, Disp: , Rfl:     JJens Som MD  11/08/2021  9:54 AM    Pt is instructed to follow all appropriate cardiac risk factor recommendations and to  be compliant with meds and testing. Instructed to follow up appropriately and seek urgent medical care if acute or unstable issues arise. Results of some tests may be viewed thru [place] but this does not substitute for follow up with MD. If follow up is not scheduled pt is instructed to call for follow up.  Any non cardiac issues identified in this visit the patient is counseled to address these with their primary care physician or appropriate specialist.    Jens Som, MD

## 2021-11-13 ENCOUNTER — Ambulatory Visit: Admit: 2021-11-13 | Discharge: 2021-11-13 | Payer: MEDICARE | Attending: Family Medicine | Primary: Family Medicine

## 2021-11-13 DIAGNOSIS — I10 Essential (primary) hypertension: Secondary | ICD-10-CM

## 2021-11-13 MED ORDER — SIMVASTATIN 20 MG PO TABS
20 MG | ORAL_TABLET | Freq: Every evening | ORAL | 3 refills | Status: AC
Start: 2021-11-13 — End: 2022-10-25

## 2021-11-13 MED ORDER — TELMISARTAN 80 MG PO TABS
80 MG | ORAL_TABLET | Freq: Every day | ORAL | 3 refills | Status: DC
Start: 2021-11-13 — End: 2022-11-26

## 2021-11-13 NOTE — Progress Notes (Signed)
SUBJECTIVE:   Brandy Vargas is a 81 y.o. female who has a past medical history significant for hypertension, high cholesterol, overactive bladder and paroxysmal atrial fibrillation.  Review of systems reveals no complaints of chest pain, shortness of breath, orthopnea or PND.  GI and GU review of systems is unremarkable.  Current medications are listed in the EMR and reviewed today.      HPI  See above    Past Medical History, Past Surgical History, Family history, Social History, and Medications were all reviewed with the patient today and updated as necessary.       Current Outpatient Medications   Medication Sig Dispense Refill    ELIQUIS 5 MG TABS tablet Take 1 tablet by mouth twice daily 180 tablet 3    simvastatin (ZOCOR) 20 MG tablet Take 1 tablet by mouth nightly 90 tablet 1    metoprolol tartrate (LOPRESSOR) 50 MG tablet Take 1 tablet by mouth twice daily 180 tablet 3    telmisartan (MICARDIS) 80 MG tablet Take 1 tablet by mouth once daily 30 tablet 5    solifenacin (VESICARE) 10 MG tablet Take 1 tablet by mouth once daily 30 tablet 5    Handicap Placard MISC by Does not apply route 1 each 0    calcium carbonate (OYSTER SHELL CALCIUM 500 MG) 1250 (500 Ca) MG tablet Take 1 tablet by mouth daily      Cholecalciferol 50 MCG (2000 UT) CAPS Take 1 capsule by mouth daily      dilTIAZem (CARDIZEM) 30 MG tablet Take 1 tablet po prn q 4h for afib. Heart rate >100       No current facility-administered medications for this visit.     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     COVID-19 vaccine series completed 04/08/2019    Moderna    GERD (gastroesophageal reflux disease)     Hypertension     Overactive bladder      Past Surgical History:   Procedure Laterality Date    APPENDECTOMY      BREAST LUMPECTOMY Right     2015    BREAST SURGERY Right 2015    cancer    KNEE ARTHROSCOPY      TUBAL LIGATION  1976     Family History   Problem Relation Age of Onset    Breast Cancer Maternal Aunt     Diabetes Paternal Grandmother     Diabetes Sister     Heart Attack Father     Hypertension Mother     High Cholesterol Mother      Social History     Tobacco Use    Smoking status: Never    Smokeless tobacco: Never   Substance Use Topics    Alcohol use: Never         Review of Systems  See  above    OBJECTIVE:  BP 122/60   Pulse 60   Ht 1.6 m (5\' 3" )   Wt 85.5 kg (188 lb 6.4 oz)   SpO2 98%   BMI 33.37 kg/m      Physical Exam  Constitutional:       General: She is not in acute distress.     Appearance: Normal appearance. She is not ill-appearing.   HENT:      Head: Normocephalic and atraumatic.   Cardiovascular:      Rate and Rhythm: Normal rate and regular rhythm.      Heart sounds: Normal heart sounds. No murmur heard.  Pulmonary:      Effort: Pulmonary effort is normal.      Breath sounds: Normal breath sounds. No wheezing or rhonchi.   Musculoskeletal:         General: Normal range of motion.      Cervical back: Normal range of motion and neck supple.      Right lower leg: No edema.      Left lower leg: No edema.   Skin:     General: Skin is warm.      Findings: No rash.   Neurological:      Mental Status: She is alert and oriented to person, place, and time.   Psychiatric:         Mood and Affect: Mood normal.         Behavior: Behavior normal.         Thought Content: Thought content normal.         Judgment: Judgment normal.         Medical problems and test results were reviewed with the patient today.         ASSESSMENT and PLAN    1.  Hypertension.  BP  122/60.  Continue combination of Lopressor and Micardis.  Renal function and electrolytes are normal.    2.  High cholesterol.  LDL 101.  Continue statin.  Liver enzymes remain normal.    3.  Overactive bladder.  Some mild stress and urge incontinence also reported.  Vesicare is helping fairly well.  Some breakthrough symptoms but patient does not desire any further work-up or treatment.    4.  Paroxysmal atrial fibrillation.  Continue anticoagulation.  No syncope or presyncope reported.    Elements of this note have been dictated using speech recognition software. As a result, errors of speech recognition may have occurred.

## 2021-11-20 ENCOUNTER — Encounter: Payer: MEDICARE | Attending: Family Medicine | Primary: Family Medicine

## 2021-12-03 ENCOUNTER — Ambulatory Visit: Payer: MEDICARE | Primary: Family Medicine

## 2021-12-15 MED ORDER — SOLIFENACIN SUCCINATE 10 MG PO TABS
10 MG | ORAL_TABLET | ORAL | 5 refills | Status: DC
Start: 2021-12-15 — End: 2022-03-20

## 2022-03-20 ENCOUNTER — Encounter: Admit: 2022-03-20 | Discharge: 2022-03-20 | Payer: MEDICARE | Attending: Family Medicine | Primary: Family Medicine

## 2022-03-20 ENCOUNTER — Encounter

## 2022-03-20 DIAGNOSIS — Z0000 Encounter for general adult medical examination without abnormal findings: Secondary | ICD-10-CM

## 2022-03-20 MED ORDER — METOPROLOL TARTRATE 50 MG PO TABS
50 MG | ORAL_TABLET | Freq: Two times a day (BID) | ORAL | 3 refills | Status: DC
Start: 2022-03-20 — End: 2023-04-11

## 2022-03-20 MED ORDER — MIRABEGRON ER 50 MG PO TB24
50 MG | ORAL_TABLET | Freq: Every day | ORAL | 5 refills | Status: DC
Start: 2022-03-20 — End: 2022-12-05

## 2022-03-20 MED ORDER — SOLIFENACIN SUCCINATE 10 MG PO TABS
10 | ORAL_TABLET | Freq: Every day | ORAL | 5 refills | Status: DC
Start: 2022-03-20 — End: 2022-03-20

## 2022-03-20 NOTE — Patient Instructions (Signed)
Learning About Being Active as an Older Adult  Why is being active important as you get older?     Being active is one of the best things you can do for your health. And it's never too late to start. Being active--or getting active, if you aren't already--has definite benefits. It can:  Give you more energy,  Keep your mind sharp.  Improve balance to reduce your risk of falls.  Help you manage chronic illness with fewer medicines.  No matter how old you are, how fit you are, or what health problems you have, there is a form of activity that will work for you. And the more physical activity you can do, the better your overall health will be.  What kinds of activity can help you stay healthy?  Being more active will make your daily activities easier. Physical activity includes planned exercise and things you do in daily life. There are four types of activity:  Aerobic.  Doing aerobic activity makes your heart and lungs strong.  Includes walking, dancing, and gardening.  Aim for at least 2 hours spread throughout the week.  It improves your energy and can help you sleep better.  Muscle-strengthening.  This type of activity can help maintain muscle and strengthen bones.  Includes climbing stairs, using resistance bands, and lifting or carrying heavy loads.  Aim for at least twice a week.  It can help protect the knees and other joints.  Stretching.  Stretching gives you better range of motion in joints and muscles.  Includes upper arm stretches, calf stretches, and gentle yoga.  Aim for at least twice a week, preferably after your muscles are warmed up from other activities.  It can help you function better in daily life.  Balancing.  This helps you stay coordinated and have good posture.  Includes heel-to-toe walking, tai chi, and certain types of yoga.  Aim for at least 3 days a week.  It can reduce your risk of falling.  Even if you have a hard time meeting the recommendations, it's better to be more active  than less active. All activity done in each category counts toward your weekly total. You'd be surprised how daily things like carrying groceries, keeping up with grandchildren, and taking the stairs can add up.  What keeps you from being active?  If you've had a hard time being more active, you're not alone. Maybe you remember being able to do more. Or maybe you've never thought of yourself as being active. It's frustrating when you can't do the things you want. Being more active can help. What's holding you back?  Getting started.  Have a goal, but break it into easy tasks. Small steps build into big accomplishments.  Staying motivated.  If you feel like skipping your activity, remember your goal. Maybe you want to move better and stay independent. Every activity gets you one step closer.  Not feeling your best.  Start with 5 minutes of an activity you enjoy. Prove to yourself you can do it. As you get comfortable, increase your time.  You may not be where you want to be. But you're in the process of getting there. Everyone starts somewhere.  How can you find safe ways to stay active?  Talk with your doctor about any physical challenges you're facing. Make a plan with your doctor if you have a health problem or aren't sure how to get started with activity.  If you're already active, ask your doctor if  there is anything you should change to stay safe as your body and health change.  If you tend to feel dizzy after you take medicine, avoid activity at that time. Try being active before you take your medicine. This will reduce your risk of falls.  If you plan to be active at home, make sure to clear your space before you get started. Remove things like TV cords, coffee tables, and throw rugs. It's safest to have plenty of space to move freely.  The key to getting more active is to take it slow and steady. Try to improve only a little bit at a time. Pick just one area to improve on at first. And if an activity hurts,  stop and talk to your doctor.  Where can you learn more?  Go to https://www.bennett.info/ and enter P600 to learn more about "Learning About Being Active as an Older Adult."  Current as of: June 5, 2023Content Version: 14.0   2006-2024 [employer].   Care instructions adapted under license by [facility]. If you have questions about a medical condition or this instruction, always ask your healthcare professional. [place] any warranty or liability for your use of this information.           Starting a Weight Loss Plan: Care Instructions  Overview     It can be a challenge to lose weight. But your doctor can help you make a weight-loss plan that meets your needs.  You don't have to make a lot of big changes at once. A better idea might be to focus on small changes and stick with them. When those changes become habit, you can add a few more changes.  Some people find it helpful to take an exercise or nutrition class. If you have questions, ask your doctor about seeing a registered dietitian or an exercise specialist. You might also think about joining a weight-loss support group.  If you're not ready to make changes right now, try to pick a date in the future. Then make an appointment with your doctor to talk about when and how you'll get started with a plan.  Follow-up care is a key part of your treatment and safety. Be sure to make and go to all appointments, and call your doctor if you are having problems. It's also a good idea to know your test results and keep a list of the medicines you take.  How can you care for yourself at home?  Set realistic goals. Many people expect to lose much more weight than is likely. A weight loss of 5% to 10% of your body weight may be enough to improve your health.  Get family and friends involved to provide support. Talk to them about why you are trying to lose weight, and ask them to help. They can help by  participating in exercise and having meals with you, even if they may be eating something different.  Find what works best for you. If you do not have time or do not like to cook, a program that offers meal replacement bars or shakes may be better for you. Or if you like to prepare meals, finding a plan that includes daily menus and recipes may be best.  Ask your doctor about other health professionals who can help you achieve your weight loss goals.  A dietitian can help you make healthy changes in your diet.  An exercise specialist or personal trainer can help you develop a safe and  effective exercise program.  A counselor or psychiatrist can help you cope with issues such as depression, anxiety, or family problems that can make it hard to focus on weight loss.  Consider joining a support group for people who are trying to lose weight. Your doctor can suggest groups in your area.  Where can you learn more?  Go to https://www.bennett.info/ and enter U357 to learn more about "Starting a Weight Loss Plan: Care Instructions."  Current as of: September 20, 2023Content Version: 14.0   2006-2024 [employer], [employer].   Care instructions adapted under license by [facility]. If you have questions about a medical condition or this instruction, always ask your healthcare professional. [place] any warranty or liability for your use of this information.           A Healthy Heart: Care Instructions  Overview     Coronary artery disease, also called heart disease, occurs when a substance called plaque builds up in the vessels that supply oxygen-rich blood to your heart muscle. This can narrow the blood vessels and reduce blood flow. A heart attack happens when blood flow is completely blocked. A high-fat diet, smoking, and other factors increase the risk of heart disease.  Your doctor has found that you have a chance of having heart disease. A heart-healthy lifestyle  can help keep your heart healthy and prevent heart disease. This lifestyle includes eating healthy, being active, staying at a weight that's healthy for you, and not smoking or using tobacco. It also includes taking medicines as directed, managing other health conditions, and trying to get a healthy amount of sleep.  Follow-up care is a key part of your treatment and safety. Be sure to make and go to all appointments, and call your doctor if you are having problems. It's also a good idea to know your test results and keep a list of the medicines you take.  How can you care for yourself at home?  Diet   Use less salt when you cook and eat. This helps lower your blood pressure. Taste food before salting. Add only a little salt when you think you need it. With time, your taste buds will adjust to less salt.    Eat fewer snack items, fast foods, canned soups, and other high-salt, high-fat, processed foods.    Read food labels and try to avoid saturated and trans fats. They increase your risk of heart disease by raising cholesterol levels.    Limit the amount of solid fat--butter, margarine, and shortening--you eat. Use olive, peanut, or canola oil when you cook. Bake, broil, and steam foods instead of frying them.    Eat a variety of fruit and vegetables every day. Dark green, deep orange, red, or yellow fruits and vegetables are especially good for you. Examples include spinach, carrots, peaches, and berries.    Foods high in fiber can reduce your cholesterol and provide important vitamins and minerals. High-fiber foods include whole-grain cereals and breads, oatmeal, beans, brown rice, citrus fruits, and apples.    Eat lean proteins. Heart-healthy proteins include seafood, lean meats and poultry, eggs, beans, peas, nuts, seeds, and soy products.    Limit drinks and foods with added sugar. These include candy, desserts, and soda pop.   Heart-healthy lifestyle   If your doctor recommends it, get more exercise.  For many people, walking is a good choice. Or you may want to swim, bike, or do other activities. Bit by bit, increase the time you're active  every day. Try for at least 30 minutes on most days of the week.    Try to quit or cut back on using tobacco and other nicotine products. This includes smoking and vaping. If you need help quitting, talk to your doctor about stop-smoking programs and medicines. These can increase your chances of quitting for good. Quitting is one of the most important things you can do to protect your heart. It is never too late to quit. Try to avoid secondhand smoke too.    Stay at a weight that's healthy for you. Talk to your doctor if you need help losing weight.    Try to get 7 to 9 hours of sleep each night.    Limit alcohol to 2 drinks a day for men and 1 drink a day for women. Too much alcohol can cause health problems.    Manage other health problems such as diabetes, high blood pressure, and high cholesterol. If you think you may have a problem with alcohol or drug use, talk to your doctor.   Medicines   Take your medicines exactly as prescribed. Call your doctor if you think you are having a problem with your medicine.    If your doctor recommends aspirin, take the amount directed each day. Make sure you take aspirin and not another kind of pain reliever, such as acetaminophen (Tylenol).   When should you call for help?   Call 911 if you have symptoms of a heart attack. These may include:   Chest pain or pressure, or a strange feeling in the chest.    Sweating.    Shortness of breath.    Pain, pressure, or a strange feeling in the back, neck, jaw, or upper belly or in one or both shoulders or arms.    Lightheadedness or sudden weakness.    A fast or irregular heartbeat.   After you call 911, the operator may tell you to chew 1 adult-strength or 2 to 4 low-dose aspirin. Wait for an ambulance. Do not try to drive yourself.  Watch closely for changes in your health, and be  sure to contact your doctor if you have any problems.  Where can you learn more?  Go to https://www.bennett.info/ and enter F075 to learn more about "A Healthy Heart: Care Instructions."  Current as of: June 24, 2023Content Version: 14.0   2006-2024 Healthwise, Incorporated.   Care instructions adapted under license by [facility]. If you have questions about a medical condition or this instruction, always ask your healthcare professional. [place] any warranty or liability for your use of this information.      Personalized Preventive Plan for INDRA HINSON - 03/20/2022  Medicare offers a range of preventive health benefits. Some of the tests and screenings are paid in full while other may be subject to a deductible, co-insurance, and/or copay.    Some of these benefits include a comprehensive review of your medical history including lifestyle, illnesses that may run in your family, and various assessments and screenings as appropriate.    After reviewing your medical record and screening and assessments performed today your provider may have ordered immunizations, labs, imaging, and/or referrals for you.  A list of these orders (if applicable) as well as your Preventive Care list are included within your After Visit Summary for your review.    Other Preventive Recommendations:    A preventive eye exam performed by an eye specialist is recommended every 1-2 years to screen for  glaucoma; cataracts, macular degeneration, and other eye disorders.  A preventive dental visit is recommended every 6 months.  Try to get at least 150 minutes of exercise per week or 10,000 steps per day on a pedometer .  Order or download the FREE "Exercise & Physical Activity: Your Everyday Guide" from [employer]. Call [phone] or search [employer] online.  You need 1200-1500 mg of calcium and 1000-2000 IU of vitamin D per day. It is  possible to meet your calcium requirement with diet alone, but a vitamin D supplement is usually necessary to meet this goal.  When exposed to the sun, use a sunscreen that protects against both UVA and UVB radiation with an SPF of 30 or greater. Reapply every 2 to 3 hours or after sweating, drying off with a towel, or swimming.  Always wear a seat belt when traveling in a car. Always wear a helmet when riding a bicycle or motorcycle.

## 2022-03-20 NOTE — Progress Notes (Signed)
SUBJECTIVE:   Brandy Vargas is a 82 y.o. female who has a past medical history significant for hypertension, high cholesterol, overactive bladder and paroxysmal atrial fibrillation.  Review of systems reveals no complaints of chest pain, shortness of breath, orthopnea or PND.  Patient does report that she does not feel like her Brandy Vargas is working as well for her overactive bladder.  She has been on Myrbetriq in the past and would like to go back on it.  Patient reports also chronic bilateral knee pain.  No trauma reported.    Current medicines listed in the EMR and reviewed today.    HPI  See above    Past Medical History, Past Surgical History, Family history, Social History, and Medications were all reviewed with the patient today and updated as necessary.       Current Outpatient Medications   Medication Sig Dispense Refill    metoprolol tartrate (LOPRESSOR) 50 MG tablet Take 1 tablet by mouth 2 times daily 180 tablet 3    mirabegron (MYRBETRIQ) 50 MG TB24 Take 50 mg by mouth daily 30 tablet 5    simvastatin (ZOCOR) 20 MG tablet Take 1 tablet by mouth nightly 90 tablet 3    telmisartan (MICARDIS) 80 MG tablet Take 1 tablet by mouth daily 90 tablet 3    ELIQUIS 5 MG TABS tablet Take 1 tablet by mouth twice daily 180 tablet 3    Handicap Placard MISC by Does not apply route 1 each 0    calcium carbonate (OYSTER SHELL CALCIUM 500 MG) 1250 (500 Ca) MG tablet Take 1 tablet by mouth daily      Cholecalciferol 50 MCG (2000 UT) CAPS Take 1 capsule by mouth daily      dilTIAZem (CARDIZEM) 30 MG tablet Take 1 tablet po prn q 4h for afib. Heart rate >100       No current facility-administered medications for this visit.     Allergies   Allergen Reactions    Omeprazole Magnesium Itching     Patient Active Problem List   Diagnosis    Arthritis of left hip    Pure hyperglyceridemia    History of total hip arthroplasty, left    Paroxysmal atrial fibrillation (HCC)    Osteoarthritis    Vitamin D deficiency    Essential  hypertension    Secondary hypercoagulable state ([place])    Chronic renal disease, stage III ([place]) [id number]     Past Medical History:   Diagnosis Date    Arthritis     Atrial fibrillation ([place])     Breast cancer ([place]) 2015    right     COVID-19 vaccine series completed 04/08/2019    Moderna    GERD (gastroesophageal reflux disease)     Hypertension     Overactive bladder      Past Surgical History:   Procedure Laterality Date    APPENDECTOMY      BREAST LUMPECTOMY Right     2015    BREAST SURGERY Right 2015    cancer    KNEE ARTHROSCOPY      TUBAL LIGATION  1976     Family History   Problem Relation Age of Onset    Breast Cancer Maternal Aunt     Diabetes Paternal Grandmother     Diabetes Sister     Heart Attack Father     Hypertension Mother     High Cholesterol Mother      Social History  Tobacco Use    Smoking status: Never    Smokeless tobacco: Never   Substance Use Topics    Alcohol use: Never         Review of Systems  See above    OBJECTIVE:  BP 118/74   Pulse 51   Ht 1.6 m (5\' 3" )   Wt 86.1 kg (189 lb 12.8 oz)   SpO2 98%   BMI 33.62 kg/m      Physical Exam  Constitutional:       General: She is not in acute distress.     Appearance: Normal appearance. She is not ill-appearing.   HENT:      Head: Normocephalic and atraumatic.      Right Ear: Tympanic membrane, ear canal and external ear normal. There is no impacted cerumen.      Left Ear: Tympanic membrane, ear canal and external ear normal. There is no impacted cerumen.      Nose: Nose normal.      Mouth/Throat:      Mouth: Mucous membranes are moist.      Pharynx: Oropharynx is clear. No oropharyngeal exudate or posterior oropharyngeal erythema.   Eyes:      General: No scleral icterus.     Extraocular Movements: Extraocular movements intact.      Conjunctiva/sclera: Conjunctivae normal.      Pupils: Pupils are equal, round, and reactive to light.   Neck:      Vascular: No carotid bruit.   Cardiovascular:      Rate and Rhythm: Normal rate and regular  rhythm.      Pulses: Normal pulses.      Heart sounds: Normal heart sounds. No murmur heard.  Pulmonary:      Effort: Pulmonary effort is normal. No respiratory distress.      Breath sounds: Normal breath sounds. No wheezing.   Abdominal:      General: Abdomen is flat. Bowel sounds are normal. There is no distension.      Palpations: Abdomen is soft. There is no mass.      Tenderness: There is no abdominal tenderness.      Hernia: No hernia is present.   Musculoskeletal:         General: No swelling, tenderness or deformity. Normal range of motion.      Cervical back: Normal range of motion and neck supple.      Right lower leg: No edema.      Left lower leg: No edema.      Comments: Degenerative arthritic changes are noted in both knees bilaterally with palpation and manipulation.  No effusion.  Negative anterior and posterior drawer test.   Lymphadenopathy:      Cervical: No cervical adenopathy.   Skin:     General: Skin is warm.      Findings: No rash.   Neurological:      General: No focal deficit present.      Mental Status: She is alert and oriented to person, place, and time.      Cranial Nerves: No cranial nerve deficit.      Motor: No weakness.      Deep Tendon Reflexes: Reflexes normal.   Psychiatric:         Mood and Affect: Mood normal.         Behavior: Behavior normal.         Thought Content: Thought content normal.         Judgment: Judgment normal.  Medical problems and test results were reviewed with the patient today.         ASSESSMENT and PLAN    1.  Overactive bladder.  Patient will be switched to Myrbetriq.  If she has any issues with that she will let me know.  This point in the past and tolerated well.  Monitor renal function.    2.  Bilateral knee pain.  Degenerative arthritis.  Treat conservatively with rest and ice.  Does not desire any further workup or treatment at this time.    3.  Hypertension.  BP is 118/74.  Check metabolic panel, TSH and CBC.    4.  High cholesterol.  Check  lipid panel.    5.  Paroxysmal atrial fibrillation.  No syncope or presyncope is reported.    Elements of this note have been dictated using speech recognition software. As a result, errors of speech recognition may have occurred.

## 2022-03-20 NOTE — Progress Notes (Signed)
Medicare Annual Wellness Visit    Brandy Vargas is here for Medicare AWV (Subsequent)    Assessment & Plan   Medicare annual wellness visit, subsequent  Recommendations for Preventive Services Due: see orders and patient instructions/AVS.  Recommended screening schedule for the next 5-10 years is provided to the patient in written form: see Patient Instructions/AVS.     No follow-ups on file.     Subjective   who has a past medical history significant for hypertension, high cholesterol, overactive bladder and paroxysmal atrial fibrillation.       Patient's complete Health Risk Assessment and screening values have been reviewed and are found in Flowsheets. The following problems were reviewed today and where indicated follow up appointments were made and/or referrals ordered.    Positive Risk Factor Screenings with Interventions:                Activity, Diet, and Weight:  On average, how many days per week do you engage in moderate to strenuous exercise (like a brisk walk)?: 0 days  On average, how many minutes do you engage in exercise at this level?: 0 min    Do you eat balanced/healthy meals regularly?: Yes    Body mass index is 33.62 kg/m. (!) Abnormal      Inactivity Interventions:  Encouraged activity  Obesity Interventions:  Encourage proper diet and exercise                               Objective   Vitals:    03/20/22 1107   BP: 118/74   Pulse: 51   SpO2: 98%   Weight: 86.1 kg (189 lb 12.8 oz)   Height: 1.6 m (5\' 3" )      Body mass index is 33.62 kg/m.        General Appearance: alert and oriented to person, place and time, well developed and well- nourished, in no acute distress  Skin: warm and dry, no rash or erythema  Head: normocephalic and atraumatic  Eyes: pupils equal, round, and reactive to light, extraocular eye movements intact, conjunctivae normal  ENT: tympanic membrane, external ear and ear canal normal bilaterally, nose without deformity, nasal mucosa and turbinates normal without  polyps  Neck: supple and non-tender without mass, no thyromegaly or thyroid nodules, no cervical lymphadenopathy  Pulmonary/Chest: clear to auscultation bilaterally- no wheezes, rales or rhonchi, normal air movement, no respiratory distress  Cardiovascular: normal rate, regular rhythm, normal S1 and S2, no murmurs, rubs, clicks, or gallops, distal pulses intact, no carotid bruits  Abdomen: soft, non-tender, non-distended, normal bowel sounds, no masses or organomegaly  Extremities: no cyanosis, clubbing or edema  Musculoskeletal: normal range of motion, no joint swelling, deformity or tenderness  Neurologic: reflexes normal and symmetric, no cranial nerve deficit, gait, coordination and speech normal       Allergies   Allergen Reactions    Omeprazole Magnesium Itching     Prior to Visit Medications    Medication Sig Taking? Authorizing Provider   solifenacin (VESICARE) 10 MG tablet Take 1 tablet by mouth once daily Yes Elvis Coil, MD   simvastatin (ZOCOR) 20 MG tablet Take 1 tablet by mouth nightly Yes Elvis Coil, MD   telmisartan (MICARDIS) 80 MG tablet Take 1 tablet by mouth daily Yes Elvis Coil, MD   ELIQUIS 5 MG TABS tablet Take 1 tablet by mouth twice daily Yes Bittrick, Orland Penman, MD  metoprolol tartrate (LOPRESSOR) 50 MG tablet Take 1 tablet by mouth twice daily Yes Elvis Coil, MD   Handicap Placard MISC by Does not apply route Yes Elvis Coil, MD   calcium carbonate (OYSTER SHELL CALCIUM 500 MG) 1250 (500 Ca) MG tablet Take 1 tablet by mouth daily Yes Automatic Reconciliation, Ar   Cholecalciferol 50 MCG (2000 UT) CAPS Take 1 capsule by mouth daily Yes Automatic Reconciliation, Ar   dilTIAZem (CARDIZEM) 30 MG tablet Take 1 tablet po prn q 4h for afib. Heart rate >100 Yes Automatic Reconciliation, Ar       CareTeam (Including outside providers/suppliers regularly involved in providing care):   Patient Care Team:  Elvis Coil, MD as PCP - General  Lucia Gaskins Wenda Low, MD as PCP -  Empaneled Provider     Reviewed and updated this visit:  Tobacco  Allergies  Med Hx  Surg Hx  Soc Hx  Fam Hx

## 2022-03-21 LAB — COMPREHENSIVE METABOLIC PANEL
ALT: 21 U/L (ref 12–65)
AST: 17 U/L (ref 15–37)
Albumin/Globulin Ratio: 1.2 (ref 0.4–1.6)
Albumin: 3.7 g/dL (ref 3.2–4.6)
Alk Phosphatase: 103 U/L (ref 50–136)
Anion Gap: 3 mmol/L (ref 2–11)
BUN: 15 MG/DL (ref 8–23)
CO2: 31 mmol/L (ref 21–32)
Calcium: 9.6 MG/DL (ref 8.3–10.4)
Chloride: 103 mmol/L (ref 103–113)
Creatinine: 0.9 MG/DL (ref 0.6–1.0)
Est, Glom Filt Rate: >60 mL/min/{1.73_m2} (ref >60)
Globulin: 3.2 g/dL (ref 2.8–4.5)
Glucose: 101 mg/dL — ABNORMAL HIGH (ref 65–100)
Potassium: 4.8 mmol/L (ref 3.5–5.1)
Sodium: 137 mmol/L (ref 136–146)
Total Bilirubin: 0.5 MG/DL (ref 0.2–1.1)
Total Protein: 6.9 g/dL (ref 6.3–8.2)

## 2022-03-21 LAB — CBC WITH AUTO DIFFERENTIAL
Absolute Immature Granulocyte: 0.1 10*3/uL (ref 0.0–0.5)
Basophils %: 1 % (ref 0.0–2.0)
Basophils Absolute: 0.1 10*3/uL (ref 0.0–0.2)
Eosinophils %: 4 % (ref 0.5–7.8)
Eosinophils Absolute: 0.4 10*3/uL (ref 0.0–0.8)
Hematocrit: 44.6 % (ref 35.8–46.3)
Hemoglobin: 14.2 g/dL (ref 11.7–15.4)
Immature Granulocytes: 1 % (ref 0.0–5.0)
Lymphocytes %: 25 % (ref 13–44)
Lymphocytes Absolute: 2.6 10*3/uL (ref 0.5–4.6)
MCH: 28.3 PG (ref 26.1–32.9)
MCHC: 31.8 g/dL (ref 31.4–35.0)
MCV: 88.8 FL (ref 82–102)
MPV: 10.8 FL (ref 9.4–12.3)
Monocytes %: 8 % (ref 4.0–12.0)
Monocytes Absolute: 0.8 10*3/uL (ref 0.1–1.3)
Neutrophils %: 61 % (ref 43–78)
Neutrophils Absolute: 6.4 10*3/uL (ref 1.7–8.2)
Platelets: 218 10*3/uL (ref 150–450)
RBC: 5.02 M/uL (ref 4.05–5.2)
RDW: 13 % (ref 11.9–14.6)
WBC: 10.4 10*3/uL (ref 4.3–11.1)
nRBC: 0 10*3/uL (ref 0.0–0.2)

## 2022-03-21 LAB — LIPID PANEL
Chol/HDL Ratio: 3.5
Cholesterol, Total: 164 MG/DL (ref <200)
HDL: 47 MG/DL (ref 40–60)
LDL Calculated: 97.6 MG/DL (ref <100)
Triglycerides: 97 MG/DL (ref 35–150)
VLDL Cholesterol Calculated: 19.4 MG/DL (ref 6.0–23.0)

## 2022-03-21 LAB — TSH: TSH, 3RD GENERATION: 0.928 u[IU]/mL (ref 0.358–3.740)

## 2022-05-10 ENCOUNTER — Encounter: Payer: MEDICARE | Attending: Family Medicine | Primary: Family Medicine

## 2022-05-16 ENCOUNTER — Encounter
Admit: 2022-05-16 | Discharge: 2022-05-16 | Payer: MEDICARE | Attending: Cardiovascular Disease | Primary: Family Medicine

## 2022-05-16 DIAGNOSIS — I480 Paroxysmal atrial fibrillation: Secondary | ICD-10-CM

## 2022-05-16 NOTE — Progress Notes (Signed)
[facility]  [address], [address]  [place], [place] [address]  PHONE: [phone]    SUBJECTIVE: Brandy Vargas (12/08/1940) is a 82 y.o. female seen for a follow up visit regarding the following:   Specialty Problems          Cardiology Problems    Essential hypertension        Paroxysmal atrial fibrillation (HCC)        Pure hyperglyceridemia         Pt doing well. No chest pain. No palpitations. Patient denies syncope. No dyspnea. States they are taking meds. Maintains a normal level of activity for them without symptoms. No dizziness or lightheadedness. All above conditions stable.      Past Medical History, Past Surgical History, Family history, Social History, and Medications were all reviewed with the patient today and updated as necessary.    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     COVID-19 vaccine series completed 04/08/2019    Moderna    GERD (gastroesophageal reflux disease)     Hypertension     Overactive bladder      Past Surgical History:   Procedure Laterality Date    APPENDECTOMY      BREAST LUMPECTOMY Right     2015    BREAST SURGERY Right 2015    cancer    KNEE ARTHROSCOPY      TUBAL LIGATION  1976     Family History   Problem Relation Age of Onset    Breast Cancer Maternal Aunt     Diabetes Paternal Grandmother     Diabetes Sister     Heart Attack Father     Hypertension Mother     High Cholesterol Mother       Social History     Tobacco Use    Smoking status: Never    Smokeless tobacco: Never   Substance Use Topics    Alcohol use: Never       Current Outpatient Medications:     metoprolol tartrate (LOPRESSOR) 50 MG tablet, Take 1 tablet by mouth 2 times daily, Disp: 180 tablet, Rfl: 3    mirabegron (MYRBETRIQ) 50 MG TB24, Take 50 mg by mouth daily, Disp: 30 tablet, Rfl: 5    simvastatin (ZOCOR) 20 MG tablet, Take 1 tablet by  mouth nightly, Disp: 90 tablet, Rfl: 3    telmisartan (MICARDIS) 80 MG tablet, Take 1 tablet by mouth daily, Disp: 90 tablet, Rfl: 3    ELIQUIS 5 MG TABS tablet, Take 1 tablet by mouth twice daily, Disp: 180 tablet, Rfl: 3    Handicap Placard MISC, by Does not apply route, Disp: 1 each, Rfl: 0    calcium carbonate (OYSTER SHELL CALCIUM 500 MG) 1250 (500 Ca) MG tablet, Take 1 tablet by mouth daily, Disp: , Rfl:     Cholecalciferol 50 MCG (2000 UT) CAPS, Take 1 capsule by mouth daily, Disp: , Rfl:     dilTIAZem (CARDIZEM) 30 MG tablet, Take 1 tablet po prn q 4h for afib. Heart rate >100, Disp: , Rfl:     ROS:  Review of Systems - General ROS: negative for - chills, fatigue or fever  Hematological and Lymphatic ROS: negative for - bleeding problems, bruising or  jaundice  Respiratory ROS: no cough, shortness of breath, or wheezing  Cardiovascular ROS: no chest pain or dyspnea on exertion  Gastrointestinal ROS: no abdominal pain, change in bowel habits, or black or bloody stools  Neurological ROS: no TIA or stroke symptoms  All other systems negative.    Wt Readings from Last 3 Encounters:   03/20/22 86.1 kg (189 lb 12.8 oz)   11/13/21 85.5 kg (188 lb 6.4 oz)   11/08/21 85.7 kg (189 lb)     Temp Readings from Last 3 Encounters:   No data found for Temp     BP Readings from Last 3 Encounters:   03/20/22 118/74   11/13/21 122/60   11/08/21 120/68     Pulse Readings from Last 3 Encounters:   03/20/22 51   11/13/21 60   11/08/21 51       PHYSICAL EXAM:  There were no vitals taken for this visit.  Physical Examination: General appearance - alert, well appearing, and in no distress  Mental status - alert, oriented to person, place, and time  Eyes - pupils equal and reactive, extraocular eye movements intact  Neck/lymph - supple, no significant adenopathy  Chest/lungs - clear to auscultation, no wheezes, rales or rhonchi, symmetric air entry  Heart/CV - normal rate, regular rhythm, normal S1, S2, no murmurs, rubs, clicks or  gallops  Abdomen/GI - soft, nontender, nondistended, no masses or organomegaly  Musculoskeletal - no joint tenderness, deformity or swelling  Extremities - peripheral pulses normal, no pedal edema, no clubbing or cyanosis  Skin - normal coloration and turgor, no rashes, no suspicious skin lesions noted    EKG: .         Medications reviewed and questions answered    No results found for this or any previous visit (from the past 672 hour(s)).  Lab Results   Component Value Date/Time    CHOL 164 03/20/2022 12:08 PM    HDL 47 03/20/2022 12:08 PM    LDL 97.6 03/20/2022 12:08 PM    VLDL 19.4 03/20/2022 12:08 PM    VLDL 19 03/08/2020 01:52 AM       ASSESSMENT and PLAN  Problem List Items Addressed This Visit    None     HTN  BP today: BP: ()/()   Current regimen effective - continue    Pure hyperglyceridemia  Lipid Panel 03/2022  Total cholesterol 164, HDL 47, LDL 97, triglycerides 97  Current regimen effective - continue    Paroxysmal Afib  Rate controlled and on eliquis for OAC    Continue meds as below    Current Outpatient Medications:     metoprolol tartrate (LOPRESSOR) 50 MG tablet, Take 1 tablet by mouth 2 times daily, Disp: 180 tablet, Rfl: 3    mirabegron (MYRBETRIQ) 50 MG TB24, Take 50 mg by mouth daily, Disp: 30 tablet, Rfl: 5    simvastatin (ZOCOR) 20 MG tablet, Take 1 tablet by mouth nightly, Disp: 90 tablet, Rfl: 3    telmisartan (MICARDIS) 80 MG tablet, Take 1 tablet by mouth daily, Disp: 90 tablet, Rfl: 3    ELIQUIS 5 MG TABS tablet, Take 1 tablet by mouth twice daily, Disp: 180 tablet, Rfl: 3    Handicap Placard MISC, by Does not apply route, Disp: 1 each, Rfl: 0    calcium carbonate (OYSTER SHELL CALCIUM 500 MG) 1250 (500 Ca) MG tablet, Take 1 tablet by mouth daily, Disp: , Rfl:     Cholecalciferol 50 MCG (2000 UT)  CAPS, Take 1 capsule by mouth daily, Disp: , Rfl:     dilTIAZem (CARDIZEM) 30 MG tablet, Take 1 tablet po prn q 4h for afib. Heart rate >100, Disp: , Rfl:     Pasty Arch  05/16/2022  9:57  AM    Appropriate evaluation of guideline directed medical therapy for the patient's conditions have been reviewed.  If appropriate, adjustment of therapy for underlying cardiac issues has been offered.  If patient wishes for adjustment of therapy which would include intensification of pharmacologic therapy for any above conditions this will be sent to appropriate pharmacy.  If patient politely declines any further changes they will be encouraged to continue with current treatment and appropriate risk factor modification and healthy lifestyle.      Pt is instructed to follow all appropriate cardiac risk factor recommendations and to be compliant with meds and testing. Instructed to follow up appropriately and seek urgent medical care if acute or unstable issues arise. Results of some tests may be viewed thru MyChart but this does not substitute for follow up with MD. If follow up is not scheduled pt is instructed to call for follow up. Any non cardiac issues identified in this visit the patient is counseled to address these with their primary care physician or appropriate specialist.    I have personally seen and evaluated the patient and reviewed the students note and agree with the following assessment and plan and findings. I was present for and observed the key components of this note.  Any appropriate additions or editing of the information have been done by me.    Elisabeth Most MD, [facility]  Cardiology

## 2022-06-14 ENCOUNTER — Encounter: Admit: 2022-06-14 | Discharge: 2022-06-14 | Payer: MEDICARE | Attending: Family Medicine | Primary: Family Medicine

## 2022-06-14 DIAGNOSIS — E7800 Pure hypercholesterolemia, unspecified: Secondary | ICD-10-CM

## 2022-06-14 NOTE — Progress Notes (Signed)
SUBJECTIVE:   Brandy Vargas is a 82 y.o. female ho has a past medical history significant for hypertension, high cholesterol, overactive bladder and paroxysmal atrial fibrillation.  Patient presents today reporting that she has been experiencing fatigue and generalized malaise for "several weeks if not longer".  Patient has difficulty being more specific in the description of her symptoms.  She reports no chest pain, shortness of breath, palpitations, orthopnea or PND.  Her current medicines are listed in the EMR and reviewed today.  She reports no syncope or presyncope.  No fevers or night sweats.  No urinary symptoms.    HPI  See above    Past Medical History, Past Surgical History, Family history, Social History, and Medications were all reviewed with the patient today and updated as necessary.       Current Outpatient Medications   Medication Sig Dispense Refill    metoprolol tartrate (LOPRESSOR) 50 MG tablet Take 1 tablet by mouth 2 times daily 180 tablet 3    mirabegron (MYRBETRIQ) 50 MG TB24 Take 50 mg by mouth daily 30 tablet 5    simvastatin (ZOCOR) 20 MG tablet Take 1 tablet by mouth nightly 90 tablet 3    telmisartan (MICARDIS) 80 MG tablet Take 1 tablet by mouth daily 90 tablet 3    ELIQUIS 5 MG TABS tablet Take 1 tablet by mouth twice daily 180 tablet 3    Handicap Placard MISC by Does not apply route 1 each 0    calcium carbonate (OYSTER SHELL CALCIUM 500 MG) 1250 (500 Ca) MG tablet Take 1 tablet by mouth daily      Cholecalciferol 50 MCG (2000 UT) CAPS Take 1 capsule by mouth daily      dilTIAZem (CARDIZEM) 30 MG tablet Take 1 tablet by mouth as needed       No current facility-administered medications for this visit.     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     COVID-19 vaccine series completed 04/08/2019    Moderna    GERD (gastroesophageal reflux disease)     Hypertension     Overactive bladder      Past Surgical History:   Procedure Laterality Date    APPENDECTOMY      BREAST LUMPECTOMY Right     2015    BREAST SURGERY Right 2015    cancer    KNEE ARTHROSCOPY      TUBAL LIGATION  1976     Family History   Problem Relation Age of Onset    Breast Cancer Maternal Aunt     Diabetes Paternal Grandmother     Diabetes Sister     Heart Attack Father     Hypertension Mother     High Cholesterol Mother      Social History     Tobacco Use    Smoking status:  Never    Smokeless tobacco: Never   Substance Use Topics    Alcohol use: Never         Review of Systems  See above    OBJECTIVE:  BP 124/76   Pulse 60   Ht 1.6 m (5\' 3" )   Wt 86.7 kg (191 lb 3.2 oz)   SpO2 98%   BMI 33.87 kg/m      Physical Exam  Constitutional:       General: She is not in acute distress.     Appearance: Normal appearance. She is not ill-appearing.   HENT:      Head: Normocephalic and atraumatic.   Cardiovascular:      Rate and Rhythm: Normal rate and regular rhythm.      Heart sounds: Normal heart sounds. No murmur heard.  Pulmonary:      Effort: Pulmonary effort is normal.      Breath sounds: Normal breath sounds. No wheezing or rhonchi.   Musculoskeletal:         General: Normal range of motion.      Cervical back: Normal range of motion and neck supple.      Right lower leg: No edema.      Left lower leg: No edema.   Skin:     General: Skin is warm.      Findings: No rash.   Neurological:      Mental Status: She is alert and oriented to person, place, and time.   Psychiatric:         Mood and Affect: Mood normal.         Behavior: Behavior normal.          Thought Content: Thought content normal.         Judgment: Judgment normal.         Medical problems and test results were reviewed with the patient today.         ASSESSMENT and PLAN    1.  High cholesterol.  LDL is 97.  Currently on statin therapy.  Liver enzymes remain normal.    2.  Hypertension.  BP 124/76.  Renal function and electrolytes are normal.    3.  Fatigue.  Etiology is unclear but statin therapy may be contributing.  When I questioned the patient she acknowledges that several years ago previous physician had placed her on a different statin than her Zocor that she is currently on with resulting in similar symptoms.  We will hold her statin for a week and if her symptoms improve then we will give her a drug holiday for 2 to 3 months.  If they do not she is to let me know and we will consider potentially holding her Myrbetriq.    4.  Paroxysmal atrial fibrillation.  Patient is on a combination of Eliquis and Lopressor.  Her cardiologist has given her Cardizem to take as needed for rapid heart rate.  She has not used the Cardizem she states in several weeks.    5.  Overactive bladder.  Improved with the Myrbetriq.  Continue for now.    Elements of this note have been dictated using speech recognition software. As a result, errors of speech recognition may have occurred.

## 2022-09-13 MED ORDER — ELIQUIS 5 MG PO TABS
5 | ORAL_TABLET | Freq: Two times a day (BID) | ORAL | 0 refills | Status: DC
Start: 2022-09-13 — End: 2022-12-31

## 2022-09-13 NOTE — Telephone Encounter (Signed)
Requested Prescriptions     Pending Prescriptions Disp Refills    ELIQUIS 5 MG TABS tablet [Pharmacy Med Name: Eliquis 5 MG Oral Tablet] 180 tablet 0     Sig: Take 1 tablet by mouth twice daily           Verified rx. Last OV 05/16/22. Erx to pharm on file.

## 2022-10-17 ENCOUNTER — Encounter

## 2022-10-18 ENCOUNTER — Encounter: Admit: 2022-10-18 | Discharge: 2022-10-18 | Payer: MEDICARE | Primary: Family Medicine

## 2022-10-18 DIAGNOSIS — E7800 Pure hypercholesterolemia, unspecified: Secondary | ICD-10-CM

## 2022-10-18 LAB — COMPREHENSIVE METABOLIC PANEL
ALT: 13 U/L (ref 8–45)
AST: 19 U/L (ref 15–37)
Albumin/Globulin Ratio: 1.2 (ref 1.0–1.9)
Albumin: 3.7 g/dL (ref 3.2–4.6)
Alk Phosphatase: 97 U/L (ref 35–104)
Anion Gap: 9 mmol/L (ref 9–18)
BUN: 17 mg/dL (ref 8–23)
CO2: 28 mmol/L (ref 20–28)
Calcium: 9.5 mg/dL (ref 8.8–10.2)
Chloride: 101 mmol/L (ref 98–107)
Creatinine: 0.81 mg/dL (ref 0.60–1.10)
Est, Glom Filt Rate: 72 mL/min/{1.73_m2} (ref >60)
Globulin: 3 g/dL (ref 2.3–3.5)
Glucose: 108 mg/dL — ABNORMAL HIGH (ref 70–99)
Potassium: 4.6 mmol/L (ref 3.5–5.1)
Sodium: 138 mmol/L (ref 136–145)
Total Bilirubin: 0.5 mg/dL (ref 0.0–1.2)
Total Protein: 6.7 g/dL (ref 6.3–8.2)

## 2022-10-18 LAB — LIPID PANEL
Chol/HDL Ratio: 4.7 (ref 0.0–5.0)
Cholesterol, Total: 202 mg/dL — ABNORMAL HIGH (ref 0–200)
HDL: 43 mg/dL (ref 40–60)
LDL Cholesterol: 132 mg/dL — ABNORMAL HIGH (ref 0–100)
Triglycerides: 135 mg/dL (ref 0–150)
VLDL Cholesterol Calculated: 27 mg/dL — ABNORMAL HIGH (ref 6–23)

## 2022-10-25 ENCOUNTER — Encounter: Admit: 2022-10-25 | Discharge: 2022-10-25 | Payer: MEDICARE | Attending: Family Medicine | Primary: Family Medicine

## 2022-10-25 DIAGNOSIS — R5383 Other fatigue: Secondary | ICD-10-CM

## 2022-10-25 NOTE — Progress Notes (Signed)
"  Have you been to the ER, urgent care clinic since your last visit?  Hospitalized since your last visit?"    NO    "Have you seen or consulted any other health care providers outside our system since your last visit?"    NO

## 2022-10-25 NOTE — Progress Notes (Signed)
SUBJECTIVE:   Brandy Vargas is a 82 y.o. female  ho has a past medical history significant for hypertension, high cholesterol, overactive bladder and paroxysmal atrial fibrillation.  At previous visit the patient reported some unusual fatigue and low energy.  Please refer to prior notes for details.  Decision was made to withhold her statin.  This has made a difference in how she has felt.  She reports no active chest pain, shortness of breath, orthopnea or PND.  GI and GU review of systems is unremarkable.    HPI  See above    Past Medical History, Past Surgical History, Family history, Social History, and Medications were all reviewed with the patient today and updated as necessary.       Current Outpatient Medications   Medication Sig Dispense Refill    ELIQUIS 5 MG TABS tablet Take 1 tablet by mouth twice daily 180 tablet 0    metoprolol tartrate (LOPRESSOR) 50 MG tablet Take 1 tablet by mouth 2 times daily 180 tablet 3    mirabegron (MYRBETRIQ) 50 MG TB24 Take 50 mg by mouth daily 30 tablet 5    telmisartan (MICARDIS) 80 MG tablet Take 1 tablet by mouth daily 90 tablet 3    Handicap Placard MISC by Does not apply route 1 each 0    calcium carbonate (OYSTER SHELL CALCIUM 500 MG) 1250 (500 Ca) MG tablet Take 1 tablet by mouth daily      Cholecalciferol 50 MCG (2000 UT) CAPS Take 1 capsule by mouth daily      dilTIAZem (CARDIZEM) 30 MG tablet Take 1 tablet by mouth as needed       No current facility-administered medications for this visit.     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     COVID-19 vaccine series completed 04/08/2019    Moderna    GERD (gastroesophageal reflux disease)     Hypertension     Overactive bladder      Past Surgical History:   Procedure Laterality Date    APPENDECTOMY      BREAST LUMPECTOMY Right     2015    BREAST SURGERY Right 2015    cancer    KNEE ARTHROSCOPY      TUBAL LIGATION  1976     Family History   Problem Relation Age of Onset    Breast Cancer Maternal Aunt     Diabetes Paternal Grandmother     Diabetes Sister     Heart Attack Father     Hypertension Mother     High Cholesterol Mother      Social History     Tobacco Use    Smoking status: Never    Smokeless tobacco: Never   Substance Use Topics    Alcohol use: Never         Review of Systems  See above    OBJECTIVE:  BP 124/80 (Site: Left Upper Arm, Position: Sitting, Cuff Size: Large Adult)   Pulse 58   Resp 18   Ht 1.6 m (5\' 3" )   Wt 86.3 kg (190 lb 4.8 oz)   SpO2 96%   BMI 33.71 kg/m      Physical Exam  Constitutional:       General: She is not in acute distress.     Appearance: Normal appearance. She is not ill-appearing.   HENT:      Head: Normocephalic and atraumatic.   Cardiovascular:      Rate and Rhythm: Normal rate and regular rhythm.      Heart sounds: Normal heart sounds. No murmur heard.  Pulmonary:      Effort: Pulmonary effort is normal.      Breath sounds: Normal breath sounds. No wheezing or rhonchi.   Musculoskeletal:         General: Normal range of motion.      Cervical back: Normal range of motion and neck supple.      Right lower leg: No edema.      Left lower leg: No edema.   Skin:     General: Skin is warm.      Findings: No rash.   Neurological:      Mental Status: She is alert and oriented to person, place, and time.   Psychiatric:         Mood and Affect: Mood normal.         Behavior: Behavior normal.         Thought Content: Thought content normal.         Judgment: Judgment normal.         Medical problems and test  results were reviewed with the patient today.         ASSESSMENT and PLAN    1.  Fatigue.  Much improved with holding statin.  Will continue to do so for another 3 to 4 months.  If we decide to's restart statin will look for trying her on either Pravachol or Crestor.    2.  High cholesterol.  LDL 132.  While on statin previously 97.  Continue to withhold statin and focus on dietary management.    3.  Paroxysmal atrial fibrillation.  No syncope or presyncope reported.  Continue current therapy.    4.  Hypertension.  BP 124/80.  Doing well no complaints.  Renal function and electrolytes are normal.    5.  Overactive bladder.  Continue current therapy.  No complaints.    6.  Health maintenance.  Flu vaccine offered and given today.  Encouraged RSV vaccine.    Elements of this note have been dictated using speech recognition software. As a result, errors of speech recognition may have occurred.

## 2022-11-13 ENCOUNTER — Ambulatory Visit
Admit: 2022-11-13 | Discharge: 2022-11-13 | Payer: MEDICARE | Attending: Cardiovascular Disease | Primary: Family Medicine

## 2022-11-13 DIAGNOSIS — I480 Paroxysmal atrial fibrillation: Secondary | ICD-10-CM

## 2022-11-13 MED ORDER — DILTIAZEM HCL 30 MG PO TABS
30 | ORAL_TABLET | ORAL | 3 refills | 90.00000 days | Status: DC | PRN
Start: 2022-11-13 — End: 2023-12-04

## 2022-11-13 MED ORDER — DILTIAZEM HCL 30 MG PO TABS
30 | ORAL_TABLET | ORAL | 3 refills | Status: DC | PRN
Start: 2022-11-13 — End: 2022-11-13

## 2022-11-13 NOTE — Telephone Encounter (Signed)
 Please call [facility], they have questions about the RX for Diltiazem,Please call [facility] at [phone]

## 2022-11-13 NOTE — Progress Notes (Signed)
 [facility]  [address], [address]  [place], [place] [address]  PHONE: [phone]    SUBJECTIVE: Brandy Vargas  [name] (April 12, 1940) is a 82 y.o. female seen for a follow up visit regarding the following:   Specialty Problems          Cardiolog

## 2022-11-22 ENCOUNTER — Ambulatory Visit: Admit: 2022-11-22 | Primary: Family Medicine

## 2022-11-22 DIAGNOSIS — I480 Paroxysmal atrial fibrillation: Principal | ICD-10-CM

## 2022-11-26 MED ORDER — TELMISARTAN 80 MG PO TABS
80 MG | ORAL_TABLET | Freq: Every day | ORAL | 0 refills | Status: DC
Start: 2022-11-26 — End: 2023-02-27

## 2022-11-26 NOTE — Telephone Encounter (Signed)
 Please adv   Refill request     NOV 03/28/23    telmisartan (MICARDIS) 80 MG tablet qty 90; 0 refills    Preferred pharm: [id number] - [place], [place] - [address] - P [phone] - F [phone]

## 2022-11-28 NOTE — Telephone Encounter (Signed)
 Patient states the [facility]) is not working as well as it once was. She wants to know if it can be increased or is there something else she can try? She said she had taken the vesicare previously but she wasn't sure why it was changed. Does she need an a

## 2022-12-05 ENCOUNTER — Encounter: Admit: 2022-12-05 | Payer: MEDICARE | Admitting: Cardiovascular Disease | Primary: Family Medicine

## 2022-12-05 VITALS — BP 140/78 | HR 70 | Ht 63.0 in | Wt 192.0 lb

## 2022-12-05 DIAGNOSIS — I480 Paroxysmal atrial fibrillation: Secondary | ICD-10-CM

## 2022-12-05 MED ORDER — SOLIFENACIN SUCCINATE 10 MG PO TABS
10 MG | ORAL_TABLET | Freq: Every day | ORAL | 5 refills | Status: DC
Start: 2022-12-05 — End: 2023-05-09

## 2022-12-05 MED ORDER — ROSUVASTATIN CALCIUM 20 MG PO TABS
20 MG | ORAL_TABLET | Freq: Every day | ORAL | 1 refills | Status: DC
Start: 2022-12-05 — End: 2023-05-29

## 2022-12-05 NOTE — Progress Notes (Signed)
 [facility]  [address], [address]  [place], [place] [address]  PHONE: [phone]    SUBJECTIVE: Brandy Vargas  [name] (April 12, 1940) is a 82 y.o. female seen for a follow up visit regarding the following:   Specialty Problems          Cardiolog

## 2022-12-05 NOTE — Telephone Encounter (Signed)
 ok

## 2022-12-31 MED ORDER — ELIQUIS 5 MG PO TABS
5 | ORAL_TABLET | Freq: Two times a day (BID) | ORAL | 3 refills | Status: DC
Start: 2022-12-31 — End: 2023-12-04

## 2022-12-31 NOTE — Telephone Encounter (Signed)
 Requested Prescriptions     Pending Prescriptions Disp Refills    ELIQUIS 5 MG TABS tablet [Pharmacy Med Name: Eliquis 5 MG Oral Tablet] 180 tablet 3     Sig: Take 1 tablet by mouth twice daily     Rx verified last ov 12/05/22

## 2023-02-22 ENCOUNTER — Encounter

## 2023-02-22 LAB — LDL CHOLESTEROL, DIRECT: LDL Direct: 86 mg/dL (ref 0–100)

## 2023-02-26 ENCOUNTER — Ambulatory Visit
Admit: 2023-02-26 | Discharge: 2023-02-26 | Payer: MEDICARE | Attending: Cardiovascular Disease | Primary: Family Medicine

## 2023-02-26 VITALS — BP 136/62 | HR 68 | Ht 63.0 in | Wt 193.0 lb

## 2023-02-26 DIAGNOSIS — I480 Paroxysmal atrial fibrillation: Secondary | ICD-10-CM

## 2023-02-26 LAB — LIPOPROTEIN A (LPA): Lipoprotein (a): 89.4 nmol/L — ABNORMAL HIGH (ref <75.0)

## 2023-02-26 NOTE — Progress Notes (Addendum)
 [facility]  [address], [address]  [place], [place] [address]  PHONE: [phone]    SUBJECTIVE: Brandy Vargas (12-20-1940) is a 83 y.o. female seen for a follow up visit regarding the following:   Specialty Problems          Cardiology Problems    Essential hypertension        Paroxysmal atrial fibrillation (HCC)        Pure hyperglyceridemia         Pt doing well. No chest pain. No palpitations. Patient denies syncope. No dyspnea. States they are taking meds. Maintains a normal level of activity for them without symptoms. No dizziness or lightheadedness. All above conditions stable.  Pt's pcp took pt off simvastatin due to weakness and fatigue.     Patient had significantly elevated coronary calcium score of 475. Placed on Crestor moderate intensity and LDL dropped to 86 on 02/22/23 check. LPA was 89.4.    Past Medical History, Past Surgical History, Family history, Social History, and Medications were all reviewed with the patient today and updated as necessary.    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     COVID-19 vaccine series completed 04/08/2019    Moderna    GERD (gastroesophageal reflux disease)     Hypertension     Overactive bladder      Past Surgical History:   Procedure Laterality Date    APPENDECTOMY      BREAST LUMPECTOMY Right     2015    BREAST SURGERY Right 2015    cancer    KNEE ARTHROSCOPY      TUBAL LIGATION  1976     Family History   Problem Relation Age of Onset    Breast Cancer Maternal Aunt     Diabetes Paternal Grandmother     Diabetes Sister     Heart Attack Father     Hypertension Mother     High Cholesterol Mother       Social History     Tobacco Use    Smoking status: Never    Smokeless tobacco: Never   Substance Use Topics    Alcohol use: Never       Current Outpatient Medications:     ELIQUIS 5 MG TABS tablet, Take 1  tablet by mouth twice daily, Disp: 180 tablet, Rfl: 3    solifenacin (VESICARE) 10 MG tablet, Take 1 tablet by mouth daily, Disp: 30 tablet, Rfl: 5    rosuvastatin (CRESTOR) 20 MG tablet, Take 1 tablet by mouth daily, Disp: 90 tablet, Rfl: 1    telmisartan (MICARDIS) 80 MG tablet, Take 1 tablet by mouth once daily, Disp: 90 tablet, Rfl: 0    dilTIAZem (CARDIZEM) 30 MG tablet, Take 1 tablet by mouth as needed (1 tablet daily as needed) (Patient taking differently: Take 1 tablet by mouth daily), Disp: 120 tablet, Rfl: 3    metoprolol tartrate (LOPRESSOR) 50 MG tablet, Take 1 tablet by mouth 2 times daily, Disp: 180 tablet, Rfl: 3    Handicap Placard MISC, by Does not apply route, Disp: 1 each, Rfl: 0    calcium carbonate (OYSTER SHELL CALCIUM 500 MG) 1250 (500 Ca) MG tablet, Take 1 tablet by mouth daily, Disp: , Rfl:  Cholecalciferol 50 MCG (2000 UT) CAPS, Take 1 capsule by mouth daily, Disp: , Rfl:     ROS:  Review of Systems - General ROS: negative for - chills, fatigue or fever  Hematological and Lymphatic ROS: negative for - bleeding problems, bruising or jaundice  Respiratory ROS: no cough, shortness of breath, or wheezing  Cardiovascular ROS: no chest pain or dyspnea on exertion  Gastrointestinal ROS: no abdominal pain, change in bowel habits, or black or bloody stools  Neurological ROS: no TIA or stroke symptoms  All other systems negative.    Wt Readings from Last 3 Encounters:   12/05/22 87.1 kg (192 lb)   11/13/22 88 kg (194 lb)   10/25/22 86.3 kg (190 lb 4.8 oz)     Temp Readings from Last 3 Encounters:   No data found for Temp     BP Readings from Last 3 Encounters:   12/05/22 (!) 140/78   11/13/22 124/82   10/25/22 124/80     Pulse Readings from Last 3 Encounters:   12/05/22 70   11/13/22 54   10/25/22 58       PHYSICAL EXAM:  There were no vitals taken for this visit.  Physical Examination: General appearance - alert, well appearing, and in no distress  Mental status - alert, oriented to person,  place, and time  Eyes - pupils equal and reactive, extraocular eye movements intact  Neck/lymph - supple, no significant adenopathy  Chest/lungs - clear to auscultation, no wheezes, rales or rhonchi, symmetric air entry  Heart/CV - normal rate, regular rhythm, normal S1, S2, no murmurs, rubs, clicks or gallops  Abdomen/GI - soft, nontender, nondistended, no masses or organomegaly  Musculoskeletal - no joint tenderness, deformity or swelling  Extremities - peripheral pulses normal, no pedal edema, no clubbing or cyanosis  Skin - normal coloration and turgor, no rashes, no suspicious skin lesions noted    EKG: normal EKG, normal sinus rhythm.         Medications reviewed and questions answered    Recent Results (from the past 672 hour(s))   Lipoprotein A (LPA)    Collection Time: 02/22/23 10:23 AM   Result Value Ref Range    Lipoprotein (a) 89.4 (H) <75.0 nmol/L   LDL Cholesterol, Direct    Collection Time: 02/22/23 10:23 AM   Result Value Ref Range    LDL Direct 86 0 - 100 mg/dl     Lab Results   Component Value Date/Time    CHOL 202 10/18/2022 10:15 AM    HDL 43 10/18/2022 10:15 AM    LDL 132 10/18/2022 10:15 AM    LDL 97.6 03/20/2022 12:08 PM    VLDL 27 10/18/2022 10:15 AM    VLDL 19 03/08/2020 01:52 AM       ASSESSMENT and PLAN  Problem List Items Addressed This Visit    None     HTN  The current medical regimen is effective;  continue present plan and medications.      HLD  The current medical regimen is effective;  continue present plan and medications.     PAF  The current medical regimen is effective;  continue present plan and medications.     Coronary artery disease as manifested by elevated coronary calcium score she is on appropriate treatment with moderate intensity statin therapy she is also on ARB therapy    Patient has multiple signs and symptoms that would be concerning for potential underlying sleep apnea we will obtain a  home sleep study and assess for her history of PAF chronic fatigue and no  energy    Continue meds as below    Current Outpatient Medications:     ELIQUIS 5 MG TABS tablet, Take 1 tablet by mouth twice daily, Disp: 180 tablet, Rfl: 3    solifenacin (VESICARE) 10 MG tablet, Take 1 tablet by mouth daily, Disp: 30 tablet, Rfl: 5    rosuvastatin (CRESTOR) 20 MG tablet, Take 1 tablet by mouth daily, Disp: 90 tablet, Rfl: 1    telmisartan (MICARDIS) 80 MG tablet, Take 1 tablet by mouth once daily, Disp: 90 tablet, Rfl: 0    dilTIAZem (CARDIZEM) 30 MG tablet, Take 1 tablet by mouth as needed (1 tablet daily as needed) (Patient taking differently: Take 1 tablet by mouth daily), Disp: 120 tablet, Rfl: 3    metoprolol tartrate (LOPRESSOR) 50 MG tablet, Take 1 tablet by mouth 2 times daily, Disp: 180 tablet, Rfl: 3    Handicap Placard MISC, by Does not apply route, Disp: 1 each, Rfl: 0    calcium carbonate (OYSTER SHELL CALCIUM 500 MG) 1250 (500 Ca) MG tablet, Take 1 tablet by mouth daily, Disp: , Rfl:     Cholecalciferol 50 MCG (2000 UT) CAPS, Take 1 capsule by mouth daily, Disp: , Rfl:     No orders of the defined types were placed in this encounter.       Quentin Cornwall  02/26/2023  11:17 AM    Appropriate evaluation of guideline directed medical therapy for the patient's conditions have been reviewed.  If appropriate, adjustment of therapy for underlying cardiac issues has been offered.  If patient wishes for adjustment of therapy which would include intensification of pharmacologic therapy for any above conditions this will be sent to appropriate pharmacy.  If patient politely declines any further changes they will be encouraged to continue with current treatment and appropriate risk factor modification and healthy lifestyle.      Pt is instructed to follow all appropriate cardiac risk factor recommendations and to be compliant with meds and testing. Instructed to follow up appropriately and seek urgent medical care if acute or unstable issues arise. Results of some tests may be viewed thru  MyChart but this does not substitute for follow up with MD. If follow up is not scheduled pt is instructed to call for follow up. Any non cardiac issues identified in this visit the patient is counseled to address these with their primary care physician or appropriate specialist.    I have personally seen and evaluated the patient and reviewed the students note and agree with the following assessment and plan and findings. I was present for and observed the key components of this note.  Any appropriate additions or editing of the information have been done by me.    Elisabeth Most MD, [facility]  Cardiology

## 2023-02-26 NOTE — Addendum Note (Signed)
 Addended by: Hilliard Clark on: 02/26/2023 11:37 AM     Modules accepted: Orders, Level of Service

## 2023-02-27 MED ORDER — TELMISARTAN 80 MG PO TABS
80 MG | ORAL_TABLET | Freq: Every day | ORAL | 0 refills | Status: DC
Start: 2023-02-27 — End: 2023-04-11

## 2023-02-27 NOTE — Telephone Encounter (Signed)
 Refill request     Preferred pharm: [facility] - [place], [place] - [address] - P [phone] - [phone]

## 2023-03-25 ENCOUNTER — Inpatient Hospital Stay: Admit: 2023-03-25 | Payer: MEDICARE | Primary: Family Medicine

## 2023-03-25 DIAGNOSIS — I480 Paroxysmal atrial fibrillation: Secondary | ICD-10-CM

## 2023-03-28 ENCOUNTER — Encounter: Payer: Medicare (Managed Care) | Attending: Family Medicine | Primary: Family Medicine

## 2023-04-04 ENCOUNTER — Encounter

## 2023-04-04 NOTE — Progress Notes (Signed)
 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 AHI-11      Lowest SaO2- 81%. Per Interp patient will need a cpap titration. Sleep lab will call the patient to schedule titration or the patient can call the sleep lab at [phone] to schedule.

## 2023-04-11 ENCOUNTER — Ambulatory Visit: Admit: 2023-04-11 | Discharge: 2023-04-11 | Payer: MEDICARE | Attending: Family Medicine | Primary: Family Medicine

## 2023-04-11 VITALS — BP 142/68 | HR 48 | Ht 63.0 in | Wt 191.0 lb

## 2023-04-11 DIAGNOSIS — Z0000 Encounter for general adult medical examination without abnormal findings: Secondary | ICD-10-CM

## 2023-04-11 MED ORDER — TELMISARTAN 80 MG PO TABS
80 | ORAL_TABLET | Freq: Every day | ORAL | 3 refills | Status: DC
Start: 2023-04-11 — End: 2024-01-22

## 2023-04-11 MED ORDER — METOPROLOL TARTRATE 50 MG PO TABS
50 | ORAL_TABLET | Freq: Two times a day (BID) | ORAL | 3 refills | Status: DC
Start: 2023-04-11 — End: 2024-01-22

## 2023-04-11 NOTE — Progress Notes (Signed)
 SUBJECTIVE:   Brandy Vargas is a 83 y.o. female who has a past medical history significant for hypertension, high cholesterol, overactive bladder and paroxysmal atrial fibrillation.  In October patient was complaining of unusual fatigue.  Please refer to prior notes for details.  We withheld her statin to see if this was contributing.  Indeed she got better.  However she is now back on her statin.  Of interest is that she reports her fatigue has not worsened back to the degree of problem previously noted.    Patient also reports that her allergies have been continued to be flared up with nasal congestion.  She acknowledges that she is taking her Mucinex only once a day.    Review of systems reveals no complaints of chest pain, shortness of breath, orthopnea or PND.  GI and GU review of systems is unremarkable.  Current medications are listed in the EMR and reviewed today.      HPI  See above    Past Medical History, Past Surgical History, Family history, Social History, and Medications were all reviewed with the patient today and updated as necessary.       Current Outpatient Medications   Medication Sig Dispense Refill    metoprolol tartrate (LOPRESSOR) 50 MG tablet Take 1 tablet by mouth 2 times daily 180 tablet 3    telmisartan (MICARDIS) 80 MG tablet Take 1 tablet by mouth daily 90 tablet 3    ELIQUIS 5 MG TABS tablet Take 1 tablet by mouth twice daily 180 tablet 3    solifenacin (VESICARE) 10 MG tablet Take 1 tablet by mouth daily 30 tablet 5    rosuvastatin (CRESTOR) 20 MG tablet Take 1 tablet by mouth daily 90 tablet 1    dilTIAZem (CARDIZEM) 30 MG tablet Take 1 tablet by mouth as needed (1 tablet daily as needed) (Patient taking differently: Take 1 tablet by mouth daily) 120 tablet 3    Handicap Placard MISC by Does not apply route 1 each 0    calcium carbonate (OYSTER SHELL CALCIUM 500 MG) 1250 (500 Ca) MG tablet Take 1 tablet by mouth daily      Cholecalciferol 50 MCG (2000 UT) CAPS Take 1 capsule by  mouth daily       No current facility-administered medications for this visit.     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     COVID-19 vaccine series completed 04/08/2019    Moderna    GERD (gastroesophageal reflux disease)     Hypertension     Overactive bladder      Past Surgical History:   Procedure Laterality Date    APPENDECTOMY      BREAST LUMPECTOMY Right     2015    BREAST SURGERY Right 2015    cancer    KNEE ARTHROSCOPY      TUBAL LIGATION  1976     Family History   Problem Relation Age of Onset    Breast Cancer Maternal Aunt     Diabetes Paternal Grandmother     Diabetes Sister     Heart Attack Father     Hypertension Mother     High Cholesterol Mother      Social History     Tobacco Use    Smoking status: Never    Smokeless tobacco: Never   Substance Use Topics    Alcohol use: Never         Review of Systems  See above    OBJECTIVE:  BP (!) 142/68 (BP Site: Left Upper Arm, Patient Position: Sitting)   Pulse (!) 48   Ht 1.6 m (5\' 3" )   Wt 86.6 kg (191 lb)   SpO2 97%   BMI 33.83 kg/m      Physical Exam  Constitutional:       General: She is not in acute distress.     Appearance: Normal appearance. She is not ill-appearing.   HENT:      Head: Normocephalic and atraumatic.      Right Ear: Tympanic membrane, ear canal and external ear normal. There is no impacted cerumen.      Left Ear: Tympanic membrane, ear canal and external ear normal. There is no impacted cerumen.      Nose: Nose normal.      Mouth/Throat:      Mouth: Mucous  membranes are moist.      Pharynx: Oropharynx is clear. No oropharyngeal exudate or posterior oropharyngeal erythema.   Eyes:      General: No scleral icterus.     Extraocular Movements: Extraocular movements intact.      Conjunctiva/sclera: Conjunctivae normal.      Pupils: Pupils are equal, round, and reactive to light.   Neck:      Vascular: No carotid bruit.   Cardiovascular:      Rate and Rhythm: Normal rate and regular rhythm.      Pulses: Normal pulses.      Heart sounds: Normal heart sounds. No murmur heard.  Pulmonary:      Effort: Pulmonary effort is normal. No respiratory distress.      Breath sounds: Normal breath sounds. No wheezing.   Abdominal:      General: Abdomen is flat. Bowel sounds are normal. There is no distension.      Palpations: Abdomen is soft. There is no mass.      Tenderness: There is no abdominal tenderness.      Hernia: No hernia is present.   Musculoskeletal:         General: No swelling, tenderness or deformity. Normal range of motion.      Cervical back: Normal range of motion and neck supple.      Right lower leg: No edema.      Left lower leg: No edema.   Lymphadenopathy:      Cervical: No cervical adenopathy.   Skin:     General: Skin is warm.      Findings: No rash.   Neurological:      General: No focal deficit present.      Mental Status: She is alert and oriented to person, place, and time.      Cranial Nerves: No cranial nerve deficit.      Motor: No weakness.      Deep Tendon Reflexes: Reflexes normal.   Psychiatric:  Mood and Affect: Mood normal.         Behavior: Behavior normal.         Thought Content: Thought content normal.         Judgment: Judgment normal.         Medical problems and test results were reviewed with the patient today.         ASSESSMENT and PLAN    1.  Fatigue.  Improved with holding statin.  Back on statin now and feels "okay".    2.  Allergic rhinitis.  Increase Mucinex to twice a day.    3.  Hypertension.  BP 142/68.  Monitor outside the  office.  Salt and caffeine restriction.  Continue current therapy.  Check metabolic panel, TSH and CBC.  Further recommendations pending.  Prevnar 20 vaccine given today.    4.  Paroxysmal atrial fibrillation.  No syncope or presyncope reported.    5.  High cholesterol.  Check lipid panel.    6.  Overactive bladder.  Continue Vesicare.  Reports doing well.    Elements of this note have been dictated using speech recognition software. As a result, errors of speech recognition may have occurred.

## 2023-04-11 NOTE — Progress Notes (Signed)
 Medicare Annual Wellness Visit    Brandy Vargas is here for Medicare AWV (Subsequent)    Assessment & Plan   Medicare annual wellness visit, subsequent     No follow-ups on file.     Subjective   who has a past medical history significant for hypertension, high cholesterol, overactive bladder and paroxysmal atrial fibrillation.      Patient's complete Health Risk Assessment and screening values have been reviewed and are found in Flowsheets. The following problems were reviewed today and where indicated follow up appointments were made and/or referrals ordered.    Positive Risk Factor Screenings with Interventions:     Cognitive:      Words recalled: 2 Words Recalled     Total Score Interpretation: Abnormal Mini-Cog  Interventions:  Patient declines any further evaluation or treatment            Inactivity:  On average, how many days per week do you engage in moderate to strenuous exercise (like a brisk walk)?: (Patient-Rptd) 2 days (!) Abnormal  On average, how many minutes do you engage in exercise at this level?: (Patient-Rptd) 20 min  Interventions:  Encouraged activity     Abnormal BMI (obese):  Body mass index is 33.83 kg/m. (!) Abnormal  Interventions:  Encourage proper diet and exercise                           Objective   Vitals:    04/11/23 1015   BP: (!) 142/68   BP Site: Left Upper Arm   Patient Position: Sitting   Pulse: (!) 48   SpO2: 97%   Weight: 86.6 kg (191 lb)   Height: 1.6 m (5\' 3" )      Body mass index is 33.83 kg/m.        General Appearance: alert and oriented to person, place and time, well developed and well- nourished, in no acute distress  Skin: warm and dry, no rash or erythema  Head: normocephalic and atraumatic  Eyes: pupils equal, round, and reactive to light, extraocular eye movements intact, conjunctivae normal  ENT: tympanic membrane, external ear and ear canal normal bilaterally, nose without deformity, nasal mucosa and turbinates normal without polyps  Neck: supple and  non-tender without mass, no thyromegaly or thyroid nodules, no cervical lymphadenopathy  Pulmonary/Chest: clear to auscultation bilaterally- no wheezes, rales or rhonchi, normal air movement, no respiratory distress  Cardiovascular: normal rate, regular rhythm, normal S1 and S2, no murmurs, rubs, clicks, or gallops, distal pulses intact, no carotid bruits  Abdomen: soft, non-tender, non-distended, normal bowel sounds, no masses or organomegaly  Extremities: no cyanosis, clubbing or edema  Musculoskeletal: normal range of motion, no joint swelling, deformity or tenderness  Neurologic: reflexes normal and symmetric, no cranial nerve deficit, gait, coordination and speech normal            Allergies   Allergen Reactions    Omeprazole Magnesium Itching     Prior to Visit Medications    Medication Sig Taking? Authorizing Provider   metoprolol tartrate (LOPRESSOR) 50 MG tablet Take 1 tablet by mouth 2 times daily Yes Delana Meyer, MD   telmisartan (MICARDIS) 80 MG tablet Take 1 tablet by mouth daily Yes Delana Meyer, MD   ELIQUIS 5 MG TABS tablet Take 1 tablet by mouth twice daily Yes Bittrick, Karie Kirks, MD   solifenacin (VESICARE) 10 MG tablet Take 1 tablet by mouth daily Yes Marella Chimes  M, MD   rosuvastatin (CRESTOR) 20 MG tablet Take 1 tablet by mouth daily Yes Bittrick, Karie Kirks, MD   dilTIAZem (CARDIZEM) 30 MG tablet Take 1 tablet by mouth as needed (1 tablet daily as needed)  Patient taking differently: Take 1 tablet by mouth daily Yes Bittrick, Karie Kirks, MD   Handicap Placard MISC by Does not apply route Yes Delana Meyer, MD   calcium carbonate (OYSTER SHELL CALCIUM 500 MG) 1250 (500 Ca) MG tablet Take 1 tablet by mouth daily Yes Automatic Reconciliation, Ar   Cholecalciferol 50 MCG (2000 UT) CAPS Take 1 capsule by mouth daily Yes Automatic Reconciliation, Ar       CareTeam (Including outside providers/suppliers regularly involved in providing care):   Patient Care Team:  Delana Meyer, MD as PCP -  General  Ezzard Standing Jari Favre, MD as PCP - Empaneled Provider     Recommendations for Preventive Services Due: see orders and patient instructions/AVS.  Recommended screening schedule for the next 5-10 years is provided to the patient in written form: see Patient Instructions/AVS.     Reviewed and updated this visit:  Allergies

## 2023-04-11 NOTE — Patient Instructions (Signed)
 Learning About Being Active as an Older Adult  Why is being active important as you get older?     Being active is one of the best things you can do for your health. And it's never too late to start. Being active--or getting active, if you aren't already--has definite benefits. It can:  Give you more energy,  Keep your mind sharp.  Improve balance to reduce your risk of falls.  Help you manage chronic illness with fewer medicines.  No matter how old you are, how fit you are, or what health problems you have, there is a form of activity that will work for you. And the more physical activity you can do, the better your overall health will be.  What kinds of activity can help you stay healthy?  Being more active will make your daily activities easier. Physical activity includes planned exercise and things you do in daily life. There are four types of activity:  Aerobic.  Doing aerobic activity makes your heart and lungs strong.  Includes walking, dancing, and gardening.  Aim for at least 2 hours spread throughout the week.  It improves your energy and can help you sleep better.  Muscle-strengthening.  This type of activity can help maintain muscle and strengthen bones.  Includes climbing stairs, using resistance bands, and lifting or carrying heavy loads.  Aim for at least twice a week.  It can help protect the knees and other joints.  Stretching.  Stretching gives you better range of motion in joints and muscles.  Includes upper arm stretches, calf stretches, and gentle yoga.  Aim for at least twice a week, preferably after your muscles are warmed up from other activities.  It can help you function better in daily life.  Balancing.  This helps you stay coordinated and have good posture.  Includes heel-to-toe walking, tai chi, and certain types of yoga.  Aim for at least 3 days a week.  It can reduce your risk of falling.  Even if you have a hard time meeting the recommendations, it's better to be more active  than less active. All activity done in each category counts toward your weekly total. You'd be surprised how daily things like carrying groceries, keeping up with grandchildren, and taking the stairs can add up.  What keeps you from being active?  If you've had a hard time being more active, you're not alone. Maybe you remember being able to do more. Or maybe you've never thought of yourself as being active. It's frustrating when you can't do the things you want. Being more active can help. What's holding you back?  Getting started.  Have a goal, but break it into easy tasks. Small steps build into big accomplishments.  Staying motivated.  If you feel like skipping your activity, remember your goal. Maybe you want to move better and stay independent. Every activity gets you one step closer.  Not feeling your best.  Start with 5 minutes of an activity you enjoy. Prove to yourself you can do it. As you get comfortable, increase your time.  You may not be where you want to be. But you're in the process of getting there. Everyone starts somewhere.  How can you find safe ways to stay active?  Talk with your doctor about any physical challenges you're facing. Make a plan with your doctor if you have a health problem or aren't sure how to get started with activity.  If you're already active, ask your doctor if  there is anything you should change to stay safe as your body and health change.  If you tend to feel dizzy after you take medicine, avoid activity at that time. Try being active before you take your medicine. This will reduce your risk of falls.  If you plan to be active at home, make sure to clear your space before you get started. Remove things like TV cords, coffee tables, and throw rugs. It's safest to have plenty of space to move freely.  The key to getting more active is to take it slow and steady. Try to improve only a little bit at a time. Pick just one area to improve on at first. And if an activity hurts,  stop and talk to your doctor.  Where can you learn more?  Go to [employer] and enter P600 to learn more about "Learning About Being Active as an Older Adult."  Current as of: August 01, 2022  Content Version: 14.4   2024-2025 [place], .   Care instructions adapted under license by [facility]. If you have questions about a medical condition or this instruction, always ask your healthcare professional. Larene Beach, [facility], disclaims any warranty or liability for your use of this information.         Starting a Weight-Loss Plan: Care Instructions  Overview    It can be a challenge to lose weight. But your doctor can help you make a weight-loss plan that meets your needs.  You don't have to make a lot of big changes at once. A better idea might be to focus on small changes and stick with them. When those changes become habit, you can add a few more changes.  Some people find it helpful to take an exercise or nutrition class. If you have questions, ask your doctor about seeing a registered dietitian or an exercise specialist. You might also think about joining a weight-loss support group.  If you're not ready to make changes right now, try to pick a date in the future. Then make an appointment with your doctor to talk about when and how you'll get started with a plan.  Follow-up care is a key part of your treatment and safety. Be sure to make and go to all appointments, and call your doctor if you are having problems. It's also a good idea to know your test results and keep a list of the medicines you take.  How can you care for yourself as you start a weight-loss plan?  Set realistic goals. Many people expect to lose much more weight than is likely. A weight loss of 5% to 10% of your body weight may be enough to improve your health.  Get family and friends involved to provide support. Talk to them about why you are trying to lose weight, and ask them to help. They can help by  participating in exercise and having meals with you, even if they may be eating something different.  Find what works best for you. If you do not have time or do not like to cook, a program that offers meal replacement bars or shakes may be better for you. Or if you like to prepare meals, finding a plan that includes daily menus and recipes may be best.  Ask your doctor about other health professionals who can help you achieve your weight-loss goals.  A dietitian can help you make healthy changes in your diet.  An exercise specialist or personal trainer can help you develop  a safe and effective exercise program.  A counselor or psychiatrist can help you cope with issues such as depression, anxiety, or family problems that can make it hard to focus on weight loss.  Consider joining a support group for people who are trying to lose weight. Your doctor can suggest groups in your area.  Where can you learn more?  Go to [employer] and enter U357 to learn more about "Starting a Weight-Loss Plan: Care Instructions."  Current as of: May 01, 2022  Content Version: 14.4   2024-2025 [place], [place].   Care instructions adapted under license by [facility]. If you have questions about a medical condition or this instruction, always ask your healthcare professional. Larene Beach, [facility], disclaims any warranty or liability for your use of this information.         A Healthy Heart: Care Instructions  Overview     Coronary artery disease, also called heart disease, occurs when a substance called plaque builds up in the vessels that supply oxygen-rich blood to your heart muscle. This can narrow the blood vessels and reduce blood flow. A heart attack happens when blood flow is completely blocked. A high-fat diet, smoking, and other factors increase the risk of heart disease.  Your doctor has found that you have a chance of having heart disease. A heart-healthy lifestyle can help keep your heart  healthy and prevent heart disease. This lifestyle includes eating healthy, being active, staying at a weight that's healthy for you, and not smoking or using tobacco. It also includes taking medicines as directed, managing other health conditions, and trying to get a healthy amount of sleep.  Follow-up care is a key part of your treatment and safety. Be sure to make and go to all appointments, and call your doctor if you are having problems. It's also a good idea to know your test results and keep a list of the medicines you take.  How can you care for yourself at home?  Diet    Use less salt when you cook and eat. This helps lower your blood pressure. Taste food before salting. Add only a little salt when you think you need it. With time, your taste buds will adjust to less salt.     Eat fewer snack items, fast foods, canned soups, and other high-salt, high-fat, processed foods.     Read food labels and try to avoid saturated and trans fats. They increase your risk of heart disease by raising cholesterol levels.     Limit the amount of solid fat--butter, margarine, and shortening--you eat. Use olive, peanut, or canola oil when you cook. Bake, broil, and steam foods instead of frying them.     Eat a variety of fruit and vegetables every day. Dark green, deep orange, red, or yellow fruits and vegetables are especially good for you. Examples include spinach, carrots, peaches, and berries.     Foods high in fiber can reduce your cholesterol and provide important vitamins and minerals. High-fiber foods include whole-grain cereals and breads, oatmeal, beans, brown rice, citrus fruits, and apples.     Eat lean proteins. Heart-healthy proteins include seafood, lean meats and poultry, eggs, beans, peas, nuts, seeds, and soy products.     Limit drinks and foods with added sugar. These include candy, desserts, and soda pop.   Heart-healthy lifestyle    If your doctor recommends it, get more exercise. For many people, walking  is a good choice. Or you may want to swim,  bike, or do other activities. Bit by bit, increase the time you're active every day. Try for at least 30 minutes on most days of the week.     Try to quit or cut back on using tobacco and other nicotine products. This includes smoking and vaping. If you need help quitting, talk to your doctor about stop-smoking programs and medicines. These can increase your chances of quitting for good. Quitting is one of the most important things you can do to protect your heart. It is never too late to quit. Try to avoid secondhand smoke too.     Stay at a weight that's healthy for you. Talk to your doctor if you need help losing weight.     Try to get 7 to 9 hours of sleep each night.     Limit alcohol to 2 drinks a day for men and 1 drink a day for women. Too much alcohol can cause health problems.     Manage other health problems such as diabetes, high blood pressure, and high cholesterol. If you think you may have a problem with alcohol or drug use, talk to your doctor.   Medicines    Take your medicines exactly as prescribed. Call your doctor if you think you are having a problem with your medicine.     If your doctor recommends aspirin, take the amount directed each day. Make sure you take aspirin and not another kind of pain reliever, such as acetaminophen (Tylenol).   When should you call for help?   Call 911 if you have symptoms of a heart attack. These may include:    Chest pain or pressure, or a strange feeling in the chest.     Sweating.     Shortness of breath.     Pain, pressure, or a strange feeling in the back, neck, jaw, or upper belly or in one or both shoulders or arms.     Lightheadedness or sudden weakness.     A fast or irregular heartbeat.   After you call 911, the operator may tell you to chew 1 adult-strength or 2 to 4 low-dose aspirin. Wait for an ambulance. Do not try to drive yourself.  Watch closely for changes in your health, and be sure to contact your  doctor if you have any problems.  Where can you learn more?  Go to [employer] and enter F075 to learn more about "A Healthy Heart: Care Instructions."  Current as of: August 01, 2022  Content Version: 14.4   2024-2025 [place], [place].   Care instructions adapted under license by [facility]. If you have questions about a medical condition or this instruction, always ask your healthcare professional. Larene Beach, [facility], disclaims any warranty or liability for your use of this information.    Personalized Preventive Plan for Brandy Vargas - 04/11/2023  Medicare offers a range of preventive health benefits. Some of the tests and screenings are paid in full while other may be subject to a deductible, co-insurance, and/or copay.  Some of these benefits include a comprehensive review of your medical history including lifestyle, illnesses that may run in your family, and various assessments and screenings as appropriate.  After reviewing your medical record and screening and assessments performed today your provider may have ordered immunizations, labs, imaging, and/or referrals for you.  A list of these orders (if applicable) as well as your Preventive Care list are included within your After Visit Summary for your review.

## 2023-04-12 LAB — COMPREHENSIVE METABOLIC PANEL
ALT: 19 U/L (ref 8–45)
AST: 25 U/L (ref 15–37)
Albumin/Globulin Ratio: 1.3 (ref 1.0–1.9)
Albumin: 3.8 g/dL (ref 3.2–4.6)
Alk Phosphatase: 109 U/L — ABNORMAL HIGH (ref 35–104)
Anion Gap: 11 mmol/L (ref 7–16)
BUN: 14 mg/dL (ref 8–23)
CO2: 27 mmol/L (ref 20–29)
Calcium: 9.7 mg/dL (ref 8.8–10.2)
Chloride: 101 mmol/L (ref 98–107)
Creatinine: 0.9 mg/dL (ref 0.60–1.10)
Est, Glom Filt Rate: 63 mL/min/{1.73_m2} (ref >60)
Globulin: 3 g/dL (ref 2.3–3.5)
Glucose: 89 mg/dL (ref 70–99)
Potassium: 5.2 mmol/L — ABNORMAL HIGH (ref 3.5–5.1)
Sodium: 139 mmol/L (ref 136–145)
Total Bilirubin: 0.5 mg/dL (ref 0.0–1.2)
Total Protein: 6.8 g/dL (ref 6.3–8.2)

## 2023-04-12 LAB — LIPID PANEL
Chol/HDL Ratio: 3.3 (ref 0.0–5.0)
Cholesterol, Total: 135 mg/dL (ref 0–200)
HDL: 42 mg/dL (ref 40–60)
LDL Cholesterol: 71 mg/dL (ref 0–100)
Triglycerides: 114 mg/dL (ref 0–150)
VLDL Cholesterol Calculated: 23 mg/dL (ref 6–23)

## 2023-04-12 LAB — CBC WITH AUTO DIFFERENTIAL
Basophils %: 0.7 % (ref 0.0–2.0)
Basophils Absolute: 0.07 10*3/uL (ref 0.00–0.20)
Eosinophils %: 3.2 % (ref 0.5–7.8)
Eosinophils Absolute: 0.33 10*3/uL (ref 0.00–0.80)
Hematocrit: 44 % (ref 35.8–46.3)
Hemoglobin: 14.2 g/dL (ref 11.7–15.4)
Immature Granulocytes %: 0.3 % (ref 0.0–5.0)
Immature Granulocytes Absolute: 0.03 10*3/uL (ref 0.0–0.5)
Lymphocytes %: 28.1 % (ref 13.0–44.0)
Lymphocytes Absolute: 2.93 10*3/uL (ref 0.50–4.60)
MCH: 28.3 pg (ref 26.1–32.9)
MCHC: 32.3 g/dL (ref 31.4–35.0)
MCV: 87.6 FL (ref 82–102)
MPV: 11.3 FL (ref 9.4–12.3)
Monocytes %: 8 % (ref 4.0–12.0)
Monocytes Absolute: 0.83 10*3/uL (ref 0.10–1.30)
Neutrophils %: 59.7 % (ref 43.0–78.0)
Neutrophils Absolute: 6.25 10*3/uL (ref 1.70–8.20)
Platelets: 179 10*3/uL (ref 150–450)
RBC: 5.02 M/uL (ref 4.05–5.2)
RDW: 13.3 % (ref 11.9–14.6)
WBC: 10.4 10*3/uL (ref 4.3–11.1)
nRBC: 0 10*3/uL (ref 0.0–0.2)

## 2023-04-12 LAB — TSH: TSH, 3rd Generation: 0.887 u[IU]/mL (ref 0.270–4.200)

## 2023-05-09 ENCOUNTER — Telehealth: Admit: 2023-05-09 | Discharge: 2023-05-09 | Payer: MEDICARE | Attending: Family Medicine | Primary: Family Medicine

## 2023-05-09 DIAGNOSIS — E7800 Pure hypercholesterolemia, unspecified: Secondary | ICD-10-CM

## 2023-05-09 MED ORDER — SOLIFENACIN SUCCINATE 10 MG PO TABS
10 | ORAL_TABLET | Freq: Every day | ORAL | 5 refills | 60.00000 days | Status: DC
Start: 2023-05-09 — End: 2024-01-22

## 2023-05-09 NOTE — Progress Notes (Signed)
 SUBJECTIVE:   Brandy Vargas is a 83 y.o. female who has a past medical history significant for hypertension, high cholesterol, overactive bladder and paroxysmal atrial fibrillation.  Patient presents today for virtual visit through telephone encounter.  Video MyChart messaging is deferred secondary to technical challenges.Review of systems reveals no complaints of chest pain, shortness of breath, orthopnea or PND.  GI and GU review of systems is unremarkable.  Current medications are listed in the EMR and reviewed today.      Patient's vital signs were not performed as encounter was performed virtually.  Location of virtual visit was patient's home.      HPI  See above    Past Medical History, Past Surgical History, Family history, Social History, and Medications were all reviewed with the patient today and updated as necessary.       Current Outpatient Medications   Medication Sig Dispense Refill    metoprolol  tartrate (LOPRESSOR ) 50 MG tablet Take 1 tablet by mouth 2 times daily 180 tablet 3    telmisartan  (MICARDIS ) 80 MG tablet Take 1 tablet by mouth daily 90 tablet 3    ELIQUIS  5 MG TABS tablet Take 1 tablet by mouth twice daily 180 tablet 3    solifenacin  (VESICARE ) 10 MG tablet Take 1 tablet by mouth daily 30 tablet 5    rosuvastatin  (CRESTOR ) 20 MG tablet Take 1 tablet by mouth daily 90 tablet 1    dilTIAZem  (CARDIZEM ) 30 MG tablet Take 1 tablet by mouth as needed (1 tablet daily as needed) (Patient taking differently: Take 1 tablet by mouth as needed (1 tablet daily as needed) Pt is taking is once daily) 120 tablet 3    Handicap Placard MISC by Does not apply route 1 each 0    calcium  carbonate (OYSTER SHELL CALCIUM  500 MG) 1250 (500 Ca) MG tablet Take 1 tablet by mouth daily      Cholecalciferol 50 MCG (2000 UT) CAPS Take 1 capsule by mouth daily       No current facility-administered medications for this visit.     Allergies   Allergen Reactions    Omeprazole Magnesium Itching     Patient Active Problem  List   Diagnosis    Arthritis of left hip    Pure hyperglyceridemia    History of total hip arthroplasty, left    Paroxysmal atrial fibrillation (HCC)    Osteoarthritis    Vitamin D deficiency    Essential hypertension    Secondary hypercoagulable state    Chronic renal disease, stage III (HCC) [161096]     Past Medical History:   Diagnosis Date    Arthritis     Atrial fibrillation (HCC)     Breast cancer (HCC) 2015    right     COVID-19 vaccine series completed 04/08/2019    Moderna    GERD (gastroesophageal reflux disease)     Hypertension     Overactive bladder      Past Surgical History:   Procedure Laterality Date    APPENDECTOMY      BREAST LUMPECTOMY Right     2015    BREAST SURGERY Right 2015    cancer    KNEE ARTHROSCOPY      TUBAL LIGATION  1976     Family History   Problem Relation Age of Onset    Breast Cancer Maternal Aunt     Diabetes Paternal Grandmother     Diabetes Sister     Heart Attack Father  Hypertension Mother     High Cholesterol Mother      Social History     Tobacco Use    Smoking status: Never    Smokeless tobacco: Never   Substance Use Topics    Alcohol use: Never         Review of Systems  See above    OBJECTIVE:  There were no vitals taken for this visit.     Physical Exam    Medical problems and test results were reviewed with the patient today.         ASSESSMENT and PLAN    1.  High cholesterol.  LDL is 71.  Continue statin.  Is tolerating well.  Liver enzymes remain normal.    2.  Paroxysmal atrial fibrillation.  No syncope or presyncope reported.    3.  Overactive bladder.  Continue Vesicare .  Doing well.    4.  Hypertension.  Monitor outside the office.  Renal function and electrolytes are normal with exception of potassium of 5.2.    5.  Hyperkalemia.  Potassium is 5.2.  Patient was eating a banana every day but has stopped.  Told her that if she wanted to restart she can do so but to eat 1 or 2 a week and no more.          Consent:    Brandy Vargas was evaluated through a  synchronous (real-time) audio encounter. Patient identification was verified at the start of the visit. She (or guardian if applicable) is aware that this is a billable service, which includes applicable co-pays. This visit was conducted with the patient's (and/or legal guardian's) verbal consent. She has not had a related appointment within my department in the past 7 days or scheduled within the next 24 hours.   The patient was located at Home: [address]  [place] [address].  The provider was located at Facility (Appt Dept): [address]  [place],  [place] [address].    Note: not billable if this call serves to triage the patient into an appointment for the relevant concern    On this date 05/09/2023 I have spent 21 minutes reviewing previous notes, test results and face to face with the patient discussing the diagnosis and importance of compliance with the treatment plan as well as documenting on the day of the visit. Greater than 50% of this visit was spent counseling the patient about test results, prognosis, importance of compliance, education about disease process, benefits of medications, instructions for management of acute symptoms, and follow up plans.    --Sherley Distad, MD on 05/09/2023 at 12:58 PM    An electronic signature was used to authenticate this note.

## 2023-05-09 NOTE — Progress Notes (Signed)
 "  Have you been to the ER, urgent care clinic since your last visit?  Hospitalized since your last visit?"    NO    "Have you seen or consulted any other health care providers outside our system since your last visit?"    NO

## 2023-05-13 DIAGNOSIS — G4733 Obstructive sleep apnea (adult) (pediatric): Secondary | ICD-10-CM

## 2023-05-14 ENCOUNTER — Inpatient Hospital Stay: Admit: 2023-05-14 | Payer: Medicare (Managed Care) | Primary: Family Medicine

## 2023-05-28 NOTE — Telephone Encounter (Signed)
 Requested Prescriptions     Pending Prescriptions Disp Refills    rosuvastatin  (CRESTOR ) 20 MG tablet [Pharmacy Med Name: Rosuvastatin  Calcium  20 MG Oral Tablet] 90 tablet 3     Sig: Take 1 tablet by mouth once daily         Verified rx. Last OV 12/05/22. Erx to pharm on file.

## 2023-05-29 MED ORDER — ROSUVASTATIN CALCIUM 20 MG PO TABS
20 | ORAL_TABLET | Freq: Every day | ORAL | 3 refills | 90.00000 days | Status: AC
Start: 2023-05-29 — End: ?

## 2023-07-16 NOTE — Telephone Encounter (Signed)
 Patient needs New Pt PSG appt/ AHI -11 Lowest SaO2- 81%         Forward to schedulers

## 2023-08-15 ENCOUNTER — Telehealth
Admit: 2023-08-15 | Discharge: 2023-08-15 | Payer: Medicare (Managed Care) | Attending: Physician Assistant | Primary: Family Medicine

## 2023-08-15 DIAGNOSIS — G4733 Obstructive sleep apnea (adult) (pediatric): Principal | ICD-10-CM

## 2023-08-15 NOTE — Progress Notes (Signed)
 [facility]  [address]  [place], [place] [address]  [phone]    Patient Name:  Brandy Vargas  Date of Birth:  1940-06-18    Brandy Vargas, was evaluated through a synchronous (real-time) audio-video encounter. The patient (or guardian if applicable) is aware that this is a billable service, which includes applicable co-pays. This Virtual Visit was conducted with patient's (and/or legal guardian's) consent. Patient identification was verified, and a caregiver was present when appropriate.   The patient was located at Home: [address]  [place] [address]  Provider was located at Facility (Appt Dept): [address]  [place] [address]  Confirm you are appropriately licensed, registered, or certified to deliver care in the state where the patient is located as indicated above. If you are not or unsure, please re-schedule the visit: Yes, I confirm.          --Delon LITTIE Magnus, PA on 08/15/2023 at 12:41 PM             Office Visit 08/15/2023    CHIEF COMPLAINT:    Chief Complaint   Patient presents with    Sleep Apnea    Sleep Study    Results    New Patient       HISTORY OF PRESENT ILLNESS:  Patient is being seen today via virtual visit for new pt evaluation at the request of Dr. Elgie. Pt had an HST on 03/25/23 with an AHI of 11/hr with desaturations to 81%. Pt had desaturations < 89% for 71.5 mins of the study. Pt had a CPAP titration on 05/13/23 with CPAP 9cm H2O improving sleep disordered breathing.   Pt reports that she has had issues with afib in the past. She also has complained of fatigue and Dr. Elgie recommended a sleep study. She has never had a sleep study before.   She lives alone with her dog. She goes to bed around 11pm and wakes up around 8-8:30am. She does not nap. She is able to complete all of her ADLs on her own.   She is unsure if she snores. She does wake up about once per night to use the restroom. She is able to go  right back to sleep quickly. She is able to fall asleep quickly when she goes to bed, usually within 15-20 mins. She reports rare morning headaches. The headaches are mild and typically resolve on their own.   She feels like her energy is pretty good given her age. She does walk with a cane but uses a rollator when she goes to an appt that requires her to park far away.   She does drink instant coffee decaf every morning. She will occasionally drink tea. She denies tobacco, alcohol, or drug use.   We discussed the pathophysiology of sleep apnea, risks of untreated sleep apnea, and reviewed her sleep studies in detail. We discussed recommendations for CPAP and pt is agreeable. Orders sent to [facility] for set up.           08/15/2023    11:38 AM   Sleep Medicine   Sitting and reading 1   Watching TV 2   Sitting, inactive in a public place (e.g. 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     COVID-19 vaccine series completed 04/08/2019    Moderna    GERD (gastroesophageal reflux disease)     Hypertension     Overactive bladder        Patient Active Problem List   Diagnosis    Arthritis of left hip    Pure hyperglyceridemia    History of total hip arthroplasty, left    Paroxysmal atrial fibrillation (HCC)    Osteoarthritis    Vitamin D deficiency    Essential hypertension    Secondary hypercoagulable state    Chronic renal disease, stage III (HCC) [698720]    OAB (overactive bladder)           Past Surgical History:   Procedure Laterality Date    APPENDECTOMY      BREAST LUMPECTOMY Right     2015    BREAST SURGERY Right 2015     cancer    KNEE ARTHROSCOPY      TUBAL LIGATION  1976           Social History     Socioeconomic History    Marital status: Widowed     Spouse name: Not on file    Number of children: Not on file    Years of education: Not on file    Highest education level: Not on file   Occupational History    Not on file   Tobacco Use    Smoking status: Never    Smokeless tobacco: Never   Substance and Sexual Activity    Alcohol use: Never    Drug use: Never    Sexual activity: Not on file   Other Topics Concern    Not on file   Social History Narrative    Not on file     Social Drivers of Health     Financial Resource Strain: Low Risk  (10/25/2022)    Overall Financial Resource Strain (CARDIA)     Difficulty of Paying Living Expenses: Not hard at all   Food Insecurity: Not on file (04/08/2023)   Transportation Needs: Unknown (10/25/2022)    PRAPARE - Therapist, art (Medical): Not on file     Lack of Transportation (Non-Medical): No   Physical Activity: Insufficiently Active (04/07/2023)    Exercise Vital Sign     Days of Exercise per Week: 2 days     Minutes of Exercise per Session: 20 min   Stress: Not on file   Social Connections: Unknown (07/27/2020)    Received from [facility]    Social Connections     Frequency of Communication with Friends and Family: Not asked     Frequency of Social Gatherings with Friends and Family: Not asked   Intimate Partner Violence: Unknown (07/27/2020)    Received from [facility]    Intimate Partner Violence     Fear of Current or Ex-Partner: Not asked     Emotionally Abused: Not asked     Physically Abused: Not asked     Sexually Abused: Not asked   Housing Stability: Unknown (  04/08/2023)    Housing Stability Vital Sign     Unable to Pay for Housing in the Last Year: Not on file     Number of Times Moved in the Last Year: 0     Homeless in the Last Year: No         Family History   Problem Relation Age of Onset    Breast Cancer Maternal Aunt     Diabetes Paternal  Grandmother     Diabetes Sister     Heart Attack Father     Hypertension Mother     High Cholesterol Mother          Allergies   Allergen Reactions    Omeprazole Magnesium Itching         Current Outpatient Medications   Medication Sig    rosuvastatin  (CRESTOR ) 20 MG tablet Take 1 tablet by mouth once daily    solifenacin  (VESICARE ) 10 MG tablet Take 1 tablet by mouth daily    metoprolol  tartrate (LOPRESSOR ) 50 MG tablet Take 1 tablet by mouth 2 times daily    telmisartan  (MICARDIS ) 80 MG tablet Take 1 tablet by mouth daily    ELIQUIS  5 MG TABS tablet Take 1 tablet by mouth twice daily    dilTIAZem  (CARDIZEM ) 30 MG tablet Take 1 tablet by mouth as needed (1 tablet daily as needed)    Handicap Placard MISC by Does not apply route    calcium  carbonate (OYSTER SHELL CALCIUM  500 MG) 1250 (500 Ca) MG tablet Take 1 tablet by mouth daily    Cholecalciferol 50 MCG (2000 UT) CAPS Take 1 capsule by mouth daily     No current facility-administered medications for this visit.             REVIEW OF SYSTEMS:   CONSTITUTIONAL:   There is no history of fever, chills, night sweats. Patient has no weight loss or weight gain.  + for fatigue and hypersomnia and is not on CPAP.      CARDIAC:   No chest pain, pressure, discomfort, palpitations, orthopnea, murmurs, or edema.   GI:   No dysphagia, heartburn reflux, nausea/vomiting, diarrhea, abdominal pain, or bleeding.   NEURO:   There is no history of AMS, persistent headache, decreased level of consciousness, seizures, or motor or sensory deficits.    VIRTUAL EXAM  PHYSICAL EXAMINATION:  [ INSTRUCTIONS:  [x]  Indicates a positive item  []  Indicates a negative item   Vital Signs: (As obtained by patient/caregiver at home)        Constitutional: [x]  Appears well-developed and well-nourished [x]  No apparent distress      []  Abnormal     Mental status: [x]  Alert and awake  [x]  Oriented to person/place/time [x]  Able to follow commands    []  Abnormal -     Eyes:   EOM    [x]   Normal    []   Abnormal -   Sclera  [x]   Normal    []  Abnormal -          Discharge [x]   None visible   []  Abnormal -    HENT: [x]  Normocephalic, atraumatic  []  Abnormal -  [x]  Mouth/Throat: Mucous membranes are moist    External Ears [x]  Normal  []  Abnormal -    Neck: [x]  No visualized mass []  Abnormal -     Pulmonary/Chest: [x]  Respiratory effort normal   [x]  No visualized signs of difficulty breathing or respiratory distress        []   Abnormal -      Musculoskeletal:   [x]  Normal gait with no signs of ataxia         [x]  Normal range of motion of neck        []  Abnormal -     Neurological:        [x]  No Facial Asymmetry (Cranial nerve 7 motor function) (limited exam due to video visit)          [x]  No gaze palsy        []  Abnormal -         Skin:        [x]  No significant exanthematous lesions or discoloration noted on facial skin         []  Abnormal -            Psychiatric:       [x]  Normal Affect []  Abnormal -       [x]  No Hallucinations    Other pertinent observable physical exam findings:-      ASSESSMENT:  (Medical Decision Making)      Diagnosis Orders   1. OSA (obstructive sleep apnea) -The pathophysiology of obstructive sleep apnea was reviewed with the patient.  It's potential to promote severe neurologic, cardiac, pulmonary, and gastrointestinal problems was discussed. Specifically, the increased incidence of hypertension, coronary artery disease, congestive heart failure, pulmonary hypertension, gastroesophageal reflux, pathologic hypersomnolence, memory loss, and glucose intolerance was related to the consequences of hypoxemia, hypercapnia, airway obstruction, and sympathetic overdrive.  We also discussed the ability of nasal CPAP to correct these abnormalities through maintenance of a patent airway.     Pt to start CPAP, orders sent to [facility] for home setup. Pt agreeable and aware of the importance of compliance.  DME - DURABLE MEDICAL EQUIPMENT      2. Nocturnal hypoxemia -should improve with treatment of sleep  apnea.  DME - DURABLE MEDICAL EQUIPMENT      3. Paroxysmal atrial fibrillation (HCC) -managed by Dr. Elgie.             PLAN:  Pt to start CPAP  Orders sent to [facility] for set up  Follow up in 4-6 months or sooner if needed     Orders Placed This Encounter   Procedures    DME - DURABLE MEDICAL EQUIPMENT     [facility]  Phone: @[email or web address] @    Patient Name: Brandy Vargas  DOB: [birth year]  Gender: female  Address: [address]  [place] [address]  Last 4 digits of SSN: @[id number] @    Primary Insurance: Payor: [employer] MEDICARE / Plan: [facility] AARP MEDICARE ADVANTAGE / Product Type: *No Product type* /   Subscriber ID: [id number] - (Medicare Managed)      AMB Supply Order  Order Details     DME Location:[place]-home set up please    Order Date: 08/15/2023   There were no encounter diagnoses.          (  X   )New Set-Up      machine   (     )[id number] CPAP Unit  (  x   )Z9398 Auto CPAP Unit  (     )E0470 BiLevel Unit  (     )E0470 Auto BiLevel Unit  (     )E0471 ASV         Length of need: 12 months    Pressure: 7-12  cmH20  EPR: 2  Ramp Time:  15  min    Starting Ramp Pressure:  5  cm H20    Patient had a diagnostic Apnea Hypopnea Index (AHI) of :  11    *SUPPLIES* Replace all as needed, or per coverage guidelines     Masks Type: FF    ( x  ) A7030-Full Face Mask (1 per 3 mon)  (    x) A7031-Full Mask (1 per month) Interface/Cushion      (    ) A7034-Nasal Mask (1 per 3 mon)  (    ) J2967- Nasal Mask (1 per month) Interface/Cushion  (    ) A7033-Pillow (2 per mon)  (    ) A7036-Chinstrap (1 per 6 mon)      _________________________________________________________________          Other Supplies:    (  X   )A7035-Headgear (1 per 6 mon)  ( X    )A4604-Heated Tubing (1 per 3 mon)  (  X   )J2961- Disposable Filter (2 per mon)  (   X  )E0562-Heated Humidifier (1 per year)     ( x    )A7036-Chinstrap (sometimes used with Full Face Mask) (1 per 6 mos)  (     )A7037-Tubing-without heat (1  per 3 mos)  (     )A7039-Non-Disposable Filter (1 per 6 mos)  ( x    )A7046-Water Chamber (1 per 6 mos)  (     )E0561-Humidifier non-heated (1 per 5 yrs)      Signed Date: 08/15/2023  Electronically Signed By: Delon LITTIE Magnus, PA     No orders of the defined types were placed in this encounter.         Collaborating Physician: Dr. Molinda      I spent at least 40 minutes with this established patient, and >50% of the time was spent counseling and/or coordinating care regarding OSA.        Delon LITTIE Magnus, PA  Electronically signed

## 2023-08-30 ENCOUNTER — Ambulatory Visit
Admit: 2023-08-30 | Discharge: 2023-08-30 | Payer: Medicare (Managed Care) | Attending: Cardiovascular Disease | Primary: Family Medicine

## 2023-08-30 VITALS — BP 142/68 | HR 58 | Ht 63.0 in | Wt 192.0 lb

## 2023-08-30 DIAGNOSIS — I480 Paroxysmal atrial fibrillation: Principal | ICD-10-CM

## 2023-08-30 NOTE — Progress Notes (Signed)
 [facility]  [address], [address]  [place], [place] [address]  PHONE: [phone]    SUBJECTIVE: RONALDO  Brandy Vargas (06/04/1940) is a 83 y.o. female seen for a follow up visit regarding the following:   Specialty Problems          Cardiology Problems    Essential hypertension        Paroxysmal atrial fibrillation (HCC)        Pure hyperglyceridemia         Pt doing well. No chest pain. No palpitations. Patient denies syncope. No dyspnea. States they are taking meds. Maintains a normal level of activity for them without symptoms. No dizziness or lightheadedness. All above conditions stable.      Past Medical History, Past Surgical History, Family history, Social History, and Medications were all reviewed with the patient today and updated as necessary.    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     COVID-19 vaccine series completed 04/08/2019    Moderna    GERD (gastroesophageal reflux disease)     Hypertension     Overactive bladder      Past Surgical History:   Procedure Laterality Date    APPENDECTOMY      BREAST LUMPECTOMY Right     2015    BREAST SURGERY Right 2015    cancer    KNEE ARTHROSCOPY      TUBAL LIGATION  1976     Family History   Problem Relation Age of Onset    Breast Cancer Maternal Aunt     Diabetes Paternal Grandmother     Diabetes Sister     Heart Attack Father     Hypertension Mother     High Cholesterol Mother       Social History     Tobacco Use    Smoking status: Never    Smokeless tobacco: Never   Substance Use Topics    Alcohol use: Never       Current Outpatient Medications:     rosuvastatin  (CRESTOR ) 20 MG tablet, Take 1 tablet by mouth once daily, Disp: 90 tablet, Rfl: 3    solifenacin  (VESICARE ) 10 MG tablet, Take 1 tablet by mouth daily, Disp: 30 tablet, Rfl: 5    metoprolol  tartrate (LOPRESSOR ) 50 MG tablet, Take 1  tablet by mouth 2 times daily, Disp: 180 tablet, Rfl: 3    telmisartan  (MICARDIS ) 80 MG tablet, Take 1 tablet by mouth daily, Disp: 90 tablet, Rfl: 3    ELIQUIS  5 MG TABS tablet, Take 1 tablet by mouth twice daily, Disp: 180 tablet, Rfl: 3    dilTIAZem  (CARDIZEM ) 30 MG tablet, Take 1 tablet by mouth as needed (1 tablet daily as needed), Disp: 120 tablet, Rfl: 3    Handicap Placard MISC, by Does not apply route, Disp: 1 each, Rfl: 0    calcium  carbonate (OYSTER SHELL CALCIUM  500 MG) 1250 (500 Ca) MG tablet, Take 1 tablet by mouth daily, Disp: , Rfl:     Cholecalciferol 50 MCG (2000 UT) CAPS, Take 1 capsule by mouth daily, Disp: , Rfl:     ROS:  Review of Systems - General ROS: negative for - chills, fatigue or fever  Hematological and Lymphatic ROS: negative for - bleeding problems,  bruising or jaundice  Respiratory ROS: no cough, shortness of breath, or wheezing  Cardiovascular ROS: no chest pain or dyspnea on exertion  Gastrointestinal ROS: no abdominal pain, change in bowel habits, or black or bloody stools  Neurological ROS: no TIA or stroke symptoms  All other systems negative.    Wt Readings from Last 3 Encounters:   08/30/23 87.1 kg (192 lb)   04/11/23 86.6 kg (191 lb)   02/26/23 87.5 kg (193 lb)     Temp Readings from Last 3 Encounters:   No data found for Temp     BP Readings from Last 3 Encounters:   08/30/23 (!) 142/68   04/11/23 (!) 142/68   02/26/23 136/62     Pulse Readings from Last 3 Encounters:   08/30/23 58   04/11/23 (!) 48   02/26/23 68       PHYSICAL EXAM:  BP (!) 142/68   Pulse 58   Ht 1.6 m (5' 3)   Wt 87.1 kg (192 lb)   BMI 34.01 kg/m   Physical Examination: General appearance - alert, well appearing, and in no distress  Mental status - alert, oriented to person, place, and time  Eyes - pupils equal and reactive, extraocular eye movements intact  Neck/lymph - supple, no significant adenopathy  Chest/lungs - clear to auscultation, no wheezes, rales or rhonchi, symmetric air  entry  Heart/CV - normal rate, regular rhythm, normal S1, S2, no murmurs, rubs, clicks or gallops  Abdomen/GI - soft, nontender, nondistended, no masses or organomegaly  Musculoskeletal - no joint tenderness, deformity or swelling  Extremities - peripheral pulses normal, no pedal edema, no clubbing or cyanosis  Skin - normal coloration and turgor, no rashes, no suspicious skin lesions noted    EKG: normal EKG, normal sinus rhythm, nonspecific ST and T waves changes.           Medications reviewed and questions answered    No results found for this or any previous visit (from the past 4 weeks).  Lab Results   Component Value Date/Time    CHOL 135 04/11/2023 11:11 AM    HDL 42 04/11/2023 11:11 AM    LDL 71 04/11/2023 11:11 AM    LDL 97.6 03/20/2022 12:08 PM    VLDL 23 04/11/2023 11:11 AM    VLDL 19 03/08/2020 01:52 AM       ASSESSMENT and PLAN  Problem List Items Addressed This Visit          Circulatory    Paroxysmal atrial fibrillation (HCC) - Primary    Relevant Orders    EKG 12 Lead (Completed)    Essential hypertension    Relevant Orders    EKG 12 Lead (Completed)       Genitourinary    Chronic renal disease, stage III (HCC) [698720]    Relevant Orders    EKG 12 Lead (Completed)      Paf  The current medical regimen is effective;  continue present plan and medications.    Htn  The current medical regimen is effective;  continue present plan and medications.    Plan    1.  Continue current medical regimen the patient will continue on medications as updated and documented in current office note.  Reinforced the importance of adherence to the current regimen to manage cardiovascular risk factors effectively      2.  Lifestyle modifications emphasized ongoing importance of a heart healthy diet regular physical activity and smoking cessation if appropriate.  The patient was advised to maintain a reasonable low-sodium diet and help manage blood pressure and reduce cardiovascular risk.  If possible up to 150 minutes of  moderate intensity exercise per week      3 monitoring and follow-up ordered routine blood work to monitor lipid levels renal function electrolytes thyroid hemoglobin A1c as deemed appropriate.  Monitor to assure continued safety and efficacy of current treatment plan.  Scheduled a routine follow-up visit to reassess symptoms and review lab results.  The patient was advised to seek immediate medical attention of symptoms such as chest pain shortness of breath or palpitations occur    4.  Risk factor management: Discussed the importance of maintaining optimal blood pressure and blood sugar levels reinforced adherence to antihypertensive and antidiabetic medications if appropriate    5.  Patient education and reassurance: Provided education on recognizing the signs and symptoms of worsening cardiovascular disease.  The patient was reassured about the stability of their condition and the effectiveness of their current management plan      Continue meds as below    Current Outpatient Medications:     rosuvastatin  (CRESTOR ) 20 MG tablet, Take 1 tablet by mouth once daily, Disp: 90 tablet, Rfl: 3    solifenacin  (VESICARE ) 10 MG tablet, Take 1 tablet by mouth daily, Disp: 30 tablet, Rfl: 5    metoprolol  tartrate (LOPRESSOR ) 50 MG tablet, Take 1 tablet by mouth 2 times daily, Disp: 180 tablet, Rfl: 3    telmisartan  (MICARDIS ) 80 MG tablet, Take 1 tablet by mouth daily, Disp: 90 tablet, Rfl: 3    ELIQUIS  5 MG TABS tablet, Take 1 tablet by mouth twice daily, Disp: 180 tablet, Rfl: 3    dilTIAZem  (CARDIZEM ) 30 MG tablet, Take 1 tablet by mouth as needed (1 tablet daily as needed), Disp: 120 tablet, Rfl: 3    Handicap Placard MISC, by Does not apply route, Disp: 1 each, Rfl: 0    calcium  carbonate (OYSTER SHELL CALCIUM  500 MG) 1250 (500 Ca) MG tablet, Take 1 tablet by mouth daily, Disp: , Rfl:     Cholecalciferol 50 MCG (2000 UT) CAPS, Take 1 capsule by mouth daily, Disp: , Rfl:     Orders Placed This Encounter    EKG 12 Lead      Reason for Exam?:   Other        THOM CHRISTELLA PARCEL, MD  08/30/2023  10:44 AM    Appropriate evaluation of guideline directed medical therapy for the patient's conditions have been reviewed.  If appropriate, adjustment of therapy for underlying cardiac issues has been offered.  If patient wishes for adjustment of therapy which would include intensification of pharmacologic therapy for any above conditions this will be sent to appropriate pharmacy.  If patient politely declines any further changes they will be encouraged to continue with current treatment and appropriate risk factor modification and healthy lifestyle.      Pt is instructed to follow all appropriate cardiac risk factor recommendations and to be compliant with meds and testing. Instructed to follow up appropriately and seek urgent medical care if acute or unstable issues arise. Results of some tests may be viewed thru MyChart but this does not substitute for follow up with MD. If follow up is not scheduled pt is instructed to call for follow up.  Any non cardiac issues identified in this visit the patient is counseled to address these with their primary care physician or appropriate specialist.    Clinical documentation may  include information derived from online medical resources, voice recognition software, and AI generated tools, with all content reviewed and approved by the physician.    THOM CHRISTELLA PARCEL, MD

## 2023-09-11 NOTE — Telephone Encounter (Signed)
 Patient's son called and asked me to call patient. Patient says she has on and off headaches, her BP is shifting from high and low. HR at 1:00 pm was 52 and at 2:45 pm it was 123. Patient took some medication and it is slowly going down. Sharp pain in the back of her head yesterday and neck was sore.

## 2023-09-11 NOTE — Telephone Encounter (Signed)
 I called and informed pt.of MD response and Appt.made next week /MA.

## 2023-09-11 NOTE — Telephone Encounter (Addendum)
 Pt.has dx of Afib.I spoke w/pt.she said her HR has been erratic.HR has been 52-136.She said that on Sunday she felt bad w/nagging headache and has had the headache since,neck is still stiff,yesterday had an awful pain up the back of her head.She has no energy and just does not feel good at all.BP is 126/57.Current HR is 126 currently but again id fluctuating.She is on Lopressor  50mg  bid,Eliquis  5mg  bid,Diltiazem  30mg  as needed and took 1 dose so far,and Micardis  80mg  qday.  Please advise.SABRA

## 2023-09-11 NOTE — Telephone Encounter (Signed)
 Start taking the Cardizem  every day and follow-up with me next week

## 2023-09-20 ENCOUNTER — Ambulatory Visit
Admit: 2023-09-20 | Discharge: 2023-09-20 | Payer: Medicare (Managed Care) | Attending: Cardiovascular Disease | Primary: Family Medicine

## 2023-09-20 VITALS — BP 130/70 | HR 66 | Ht 63.0 in | Wt 192.0 lb

## 2023-09-20 DIAGNOSIS — I480 Paroxysmal atrial fibrillation: Principal | ICD-10-CM

## 2023-09-20 NOTE — Progress Notes (Signed)
 [facility]  [address], [address]  [place], [place] [address]  PHONE: [phone]    SUBJECTIVE: RONALDO  Brandy Vargas (Apr 04, 1940) is a 83 y.o. female seen for a follow up visit regarding the following:   Specialty Problems          Cardiology Problems    Essential hypertension        Paroxysmal atrial fibrillation (HCC)        Pure hyperglyceridemia         Patient in for follow-up.  She carries a diagnosis of paroxysmal atrial fibrillation and is on appropriate rhythm control and stroke risk reduction with Eliquis .  Notes imply that she is having issues with her heart rate and heart rate control..  On further questioning the patient is concerned with her systolic blood pressure which has gone up to 130-136.  I reassured her that these are essentially normal readings.  She is unaware that her heart rate is causing her any issues    Past Medical History, Past Surgical History, Family history, Social History, and Medications were all reviewed with the patient today and updated as necessary.    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     COVID-19 vaccine series completed 04/08/2019    Moderna    GERD (gastroesophageal reflux disease)     Hypertension     Overactive bladder      Past Surgical History:   Procedure Laterality Date    APPENDECTOMY      BREAST LUMPECTOMY Right     2015    BREAST SURGERY Right 2015    cancer    KNEE ARTHROSCOPY      TUBAL LIGATION  1976     Family History   Problem Relation Age of Onset    Breast Cancer Maternal Aunt     Diabetes Paternal Grandmother     Diabetes Sister     Heart Attack Father     Hypertension Mother     High Cholesterol Mother       Social History     Tobacco Use    Smoking status: Never    Smokeless tobacco: Never   Substance Use Topics    Alcohol use: Never       Current Outpatient Medications:      rosuvastatin  (CRESTOR ) 20 MG tablet, Take 1 tablet by mouth once daily, Disp: 90 tablet, Rfl: 3    solifenacin  (VESICARE ) 10 MG tablet, Take 1 tablet by mouth daily, Disp: 30 tablet, Rfl: 5    metoprolol  tartrate (LOPRESSOR ) 50 MG tablet, Take 1 tablet by mouth 2 times daily, Disp: 180 tablet, Rfl: 3    telmisartan  (MICARDIS ) 80 MG tablet, Take 1 tablet by mouth daily, Disp: 90 tablet, Rfl: 3    ELIQUIS  5 MG TABS tablet, Take 1 tablet by mouth twice daily, Disp: 180 tablet, Rfl: 3    dilTIAZem  (CARDIZEM ) 30 MG tablet, Take 1 tablet by mouth as needed (1 tablet daily as needed), Disp: 120 tablet, Rfl: 3    Handicap Placard MISC, by Does not apply route, Disp: 1 each, Rfl: 0    calcium  carbonate (OYSTER SHELL CALCIUM  500 MG) 1250 (500 Ca) MG tablet, Take 1 tablet by mouth daily, Disp: , Rfl:  Cholecalciferol 50 MCG (2000 UT) CAPS, Take 1 capsule by mouth daily, Disp: , Rfl:     ROS:  Review of Systems - General ROS: negative for - chills, fatigue or fever  Hematological and Lymphatic ROS: negative for - bleeding problems, bruising or jaundice  Respiratory ROS: no cough, shortness of breath, or wheezing  Cardiovascular ROS: no chest pain or dyspnea on exertion  Gastrointestinal ROS: no abdominal pain, change in bowel habits, or black or bloody stools  Neurological ROS: no TIA or stroke symptoms  All other systems negative.    Wt Readings from Last 3 Encounters:   08/30/23 87.1 kg (192 lb)   04/11/23 86.6 kg (191 lb)   02/26/23 87.5 kg (193 lb)     Temp Readings from Last 3 Encounters:   No data found for Temp     BP Readings from Last 3 Encounters:   08/30/23 (!) 142/68   04/11/23 (!) 142/68   02/26/23 136/62     Pulse Readings from Last 3 Encounters:   08/30/23 58   04/11/23 (!) 48   02/26/23 68       PHYSICAL EXAM:  Ht 1.6 m (5' 3)   BMI 34.01 kg/m   Physical Examination: General appearance - alert, well appearing, and in no distress  Mental status - alert, oriented to person, place, and time  Eyes - pupils  equal and reactive, extraocular eye movements intact  Neck/lymph - supple, no significant adenopathy  Chest/lungs - clear to auscultation, no wheezes, rales or rhonchi, symmetric air entry  Heart/CV - normal rate, regular rhythm, normal S1, S2, no murmurs, rubs, clicks or gallops  Abdomen/GI - soft, nontender, nondistended, no masses or organomegaly  Musculoskeletal - no joint tenderness, deformity or swelling  Extremities - peripheral pulses normal, no pedal edema, no clubbing or cyanosis  Skin - normal coloration and turgor, no rashes, no suspicious skin lesions noted    EKG: normal EKG, normal sinus rhythm, nonspecific ST and T waves changes.           Medications reviewed and questions answered    No results found for this or any previous visit (from the past 4 weeks).  Lab Results   Component Value Date/Time    CHOL 135 04/11/2023 11:11 AM    HDL 42 04/11/2023 11:11 AM    LDL 71 04/11/2023 11:11 AM    LDL 97.6 03/20/2022 12:08 PM    VLDL 23 04/11/2023 11:11 AM    VLDL 19 03/08/2020 01:52 AM       ASSESSMENT and PLAN  Problem List Items Addressed This Visit          Circulatory    Paroxysmal atrial fibrillation (HCC) - Primary    Essential hypertension       Genitourinary    Chronic renal disease, stage III (HCC) [301279]      PAF  The current medical regimen is effective;  continue present plan and medications.    Htn  The current medical regimen is effective;  continue present plan and medications.    Plan    1.  Continue current medical regimen the patient will continue on medications as updated and documented in current office note.  Reinforced the importance of adherence to the current regimen to manage cardiovascular risk factors effectively      2.  Lifestyle modifications emphasized ongoing importance of a heart healthy diet regular physical activity and smoking cessation if appropriate.  The patient was advised to maintain a  reasonable low-sodium diet and help manage blood pressure and reduce  cardiovascular risk.  If possible up to 150 minutes of moderate intensity exercise per week      3 monitoring and follow-up ordered routine blood work to monitor lipid levels renal function electrolytes thyroid hemoglobin A1c as deemed appropriate.  Monitor to assure continued safety and efficacy of current treatment plan.  Scheduled a routine follow-up visit to reassess symptoms and review lab results.  The patient was advised to seek immediate medical attention of symptoms such as chest pain shortness of breath or palpitations occur    4.  Risk factor management: Discussed the importance of maintaining optimal blood pressure and blood sugar levels reinforced adherence to antihypertensive and antidiabetic medications if appropriate    5.  Patient education and reassurance: Provided education on recognizing the signs and symptoms of worsening cardiovascular disease.  The patient was reassured about the stability of their condition and the effectiveness of their current management plan    Does not really appear to be a candidate for watchman she has no issues with her Eliquis     Continue meds as below    Current Outpatient Medications:     rosuvastatin  (CRESTOR ) 20 MG tablet, Take 1 tablet by mouth once daily, Disp: 90 tablet, Rfl: 3    solifenacin  (VESICARE ) 10 MG tablet, Take 1 tablet by mouth daily, Disp: 30 tablet, Rfl: 5    metoprolol  tartrate (LOPRESSOR ) 50 MG tablet, Take 1 tablet by mouth 2 times daily, Disp: 180 tablet, Rfl: 3    telmisartan  (MICARDIS ) 80 MG tablet, Take 1 tablet by mouth daily, Disp: 90 tablet, Rfl: 3    ELIQUIS  5 MG TABS tablet, Take 1 tablet by mouth twice daily, Disp: 180 tablet, Rfl: 3    dilTIAZem  (CARDIZEM ) 30 MG tablet, Take 1 tablet by mouth as needed (1 tablet daily as needed), Disp: 120 tablet, Rfl: 3    Handicap Placard MISC, by Does not apply route, Disp: 1 each, Rfl: 0    calcium  carbonate (OYSTER SHELL CALCIUM  500 MG) 1250 (500 Ca) MG tablet, Take 1 tablet by mouth daily,  Disp: , Rfl:     Cholecalciferol 50 MCG (2000 UT) CAPS, Take 1 capsule by mouth daily, Disp: , Rfl:     No orders of the defined types were placed in this encounter.       THOM CHRISTELLA PARCEL, MD  09/20/2023  9:04 AM    Appropriate evaluation of guideline directed medical therapy for the patient's conditions have been reviewed.  If appropriate, adjustment of therapy for underlying cardiac issues has been offered.  If patient wishes for adjustment of therapy which would include intensification of pharmacologic therapy for any above conditions this will be sent to appropriate pharmacy.  If patient politely declines any further changes they will be encouraged to continue with current treatment and appropriate risk factor modification and healthy lifestyle.      Pt is instructed to follow all appropriate cardiac risk factor recommendations and to be compliant with meds and testing. Instructed to follow up appropriately and seek urgent medical care if acute or unstable issues arise. Results of some tests may be viewed thru MyChart but this does not substitute for follow up with MD. If follow up is not scheduled pt is instructed to call for follow up.  Any non cardiac issues identified in this visit the patient is counseled to address these with their primary care physician or appropriate specialist.    Clinical documentation  may include information derived from online medical resources, voice recognition software, and AI generated tools, with all content reviewed and approved by the physician.    THOM CHRISTELLA PARCEL, MD

## 2023-12-04 ENCOUNTER — Ambulatory Visit
Admit: 2023-12-04 | Discharge: 2023-12-04 | Payer: Medicare (Managed Care) | Attending: Cardiovascular Disease | Primary: Family Medicine

## 2023-12-04 VITALS — BP 134/58 | HR 54 | Ht 63.0 in | Wt 196.2 lb

## 2023-12-04 DIAGNOSIS — Z131 Encounter for screening for diabetes mellitus: Principal | ICD-10-CM

## 2023-12-04 MED ORDER — ELIQUIS 5 MG PO TABS
5 | ORAL_TABLET | Freq: Two times a day (BID) | ORAL | 3 refills | 30.00000 days | Status: AC
Start: 2023-12-04 — End: ?

## 2023-12-04 MED ORDER — BUMETANIDE 1 MG PO TABS
1 | ORAL_TABLET | Freq: Every day | ORAL | 3 refills | 30.00000 days | Status: DC
Start: 2023-12-04 — End: 2024-01-22

## 2023-12-04 MED ORDER — EMPAGLIFLOZIN 10 MG PO TABS
10 | ORAL_TABLET | Freq: Every day | ORAL | 3 refills | 90.00000 days | Status: AC
Start: 2023-12-04 — End: ?

## 2023-12-04 MED ORDER — DILTIAZEM HCL 30 MG PO TABS
30 | ORAL_TABLET | ORAL | 3 refills | 90.00000 days | Status: AC | PRN
Start: 2023-12-04 — End: ?

## 2023-12-04 MED ORDER — SPIRONOLACTONE 25 MG PO TABS
25 | ORAL_TABLET | Freq: Every day | ORAL | 3 refills | 90.00000 days | Status: AC
Start: 2023-12-04 — End: ?

## 2023-12-04 NOTE — Progress Notes (Signed)
 [address] [facility]  [address], [address]  [place], [place] [address]  [phone]    SUBJECTIVE / HPI  Brandy Vargas (04-14-1940) is a 83 y.o. female seen for a follow-up visit regarding:    Specialty Problems          Cardiology Problems    Essential hypertension        Paroxysmal atrial fibrillation [facility])        Pure hyperglyceridemia         Patient presents for longitudinal management of multiple chronic cardiovascular and metabolic conditions, including reassessment of disease control, medication optimization, and surveillance for complications. Comprehensive management is required today due to the complexity of their cardiac and cardiometabolic history.    DATA REVIEWED TODAY  (Used in the synthesis of the patient's longitudinal care plan and personally reviewed/interpreted by me)  On 09/20/2023, Brandy Vargas, an 83 year old female with essential hypertension, paroxysmal atrial fibrillation, pure hyperglyceridemia, and stage III chronic kidney disease, was seen for follow-up. She reported concerns about systolic blood pressure readings of 130-136 but was reassured these were normal, and denied any symptoms related to heart rate. Physical exam was unremarkable, and her cardiovascular risk factors were well controlled on her current regimen of anticoagulation (Eliquis ), antihypertensives, and statin. Recent labs showed well-controlled lipids (LDL 71, CHOL 135). She was advised to continue her medications, maintain a heart-healthy lifestyle, and follow up with routine monitoring; she was not considered a candidate for Watchman device due to good tolerance of Eliquis . Education was provided on recognizing symptoms of worsening cardiovascular disease and the importance of prompt reporting of acute issues.       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     COVID-19 vaccine series completed 04/08/2019    Moderna    GERD (gastroesophageal reflux disease)     Hypertension     Overactive bladder        Past Surgical History:   Procedure Laterality Date    APPENDECTOMY      BREAST LUMPECTOMY Right     2015    BREAST SURGERY Right 2015    cancer    KNEE ARTHROSCOPY      TUBAL LIGATION  1976       Family History   Problem Relation Age of Onset    Breast Cancer Maternal Aunt     Diabetes Paternal Grandmother     Diabetes Sister     Heart Attack Father     Hypertension Mother     High Cholesterol Mother        Social History     Tobacco Use    Smoking status: Never    Smokeless tobacco: Never   Substance Use Topics    Alcohol use: Never       Current Outpatient Medications:     dilTIAZem  (CARDIZEM ) 30 MG immediate release tablet, Take 1 tablet by mouth as needed (1 tablet daily as needed), Disp: 90 tablet, Rfl: 3    ELIQUIS  5 MG TABS tablet, Take 1 tablet by mouth 2 times daily, Disp: 180 tablet, Rfl: 3    empagliflozin  (JARDIANCE ) 10 MG tablet, Take 1 tablet by mouth daily, Disp: 90 tablet, Rfl: 3    spironolactone  (ALDACTONE ) 25 MG tablet, Take  1 tablet by mouth daily, Disp: 90 tablet, Rfl: 3    bumetanide  (BUMEX ) 1 MG tablet, Take 1 tablet by mouth daily, Disp: 90 tablet, Rfl: 3    rosuvastatin  (CRESTOR ) 20 MG tablet, Take 1 tablet by mouth once daily, Disp: 90 tablet, Rfl: 3    solifenacin  (VESICARE ) 10 MG tablet, Take 1 tablet by mouth daily, Disp: 30 tablet, Rfl: 5    metoprolol  tartrate (LOPRESSOR ) 50 MG tablet, Take 1 tablet by mouth 2 times daily, Disp: 180 tablet, Rfl: 3    telmisartan  (MICARDIS ) 80 MG tablet, Take 1 tablet by mouth daily, Disp: 90 tablet, Rfl: 3    Handicap Placard MISC, by Does not apply route, Disp: 1 each, Rfl: 0    calcium  carbonate (OYSTER SHELL CALCIUM  500 MG) 1250 (500 Ca) MG tablet, Take 1 tablet by mouth daily, Disp: , Rfl:     Cholecalciferol 50 MCG (2000 UT) CAPS, Take 1 capsule by mouth daily, Disp: , Rfl:       REVIEW OF  SYSTEMS (updated today)  Cardiovascular ROS: no chest pain patient does endorse dyspnea on exertion if she gets in a hurry    STOP-BANG Checklist   [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  She also has a virtual visit coming up concerning her sleep study results        Wt Readings from Last 3 Encounters:   12/04/23 89 kg (196 lb 3.2 oz)   09/20/23 87.1 kg (192 lb)   08/30/23 87.1 kg (192 lb)       Temp Readings from Last 3 Encounters:   No data found for Temp       BP Readings from Last 3 Encounters:   12/04/23 (!) 134/58   09/20/23 130/70   08/30/23 (!) 142/68       Pulse Readings from Last 3 Encounters:   12/04/23 54   09/20/23 66   08/30/23 58       PHYSICAL EXAM   BP (!) 134/58   Pulse 54   Ht 1.6 m (5' 3)   Wt 89 kg (196 lb 3.2 oz)   BMI 34.76 kg/m   General: Alert, in no acute distress.  Cardiovascular: Regular rate and rhythm normal S1-S2  Lungs: Clear to auscultation no rhonchi's rales or wheezes  Extremities: Diffuse 2+ edema.  Pulses 2+  Neuro: Alert, oriented; speech fluent; no gross focal deficits.      ECG   EKG: Sinus rhythm with nonspecific ST-T changes.   I personally reviewed and interpreted  ECG; findings were incorporated into active management of chronic cardiac and cardiometabolic conditions.    Pertinent available past medical, surgical, family, and social history, along with current medications and allergies, were reviewed and  reconciled  today and updated where clinically appropriate.      ASSESSMENT AND PLAN    ASSESSMENT AND  PLAN  Patient presents for longitudinal management of multiple chronic cardiovascular and metabolic conditions, including reassessment of disease control, medication optimization, and surveillance for complications. Comprehensive management is required today due to the complexity of their cardiac and cardiometabolic history. This encounter represents ongoing longitudinal management of a complex patient with multiple interacting comorbidities, including HFpEF, hypertension, paroxysmal atrial fibrillation, hyperglyceridemia, and stage III CKD. The patient's care requires nuanced titration of medications, ongoing risk-mitigating surveillance, and a deep understanding of their coronary, structural, hemodynamic, and rhythm history. [id number] is appropriate due to the complexity and ongoing nature of this care. Continued high-complexity evaluation is required due to the patient's cumulative cardiovascular risks and the narrow therapeutic window of their current regimen, supporting high-complexity MDM. 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  Objective Data: Weight 89 kg (196 lb 3.2 oz) compared to 87.1 kg (192 lb) on 09/20/23, 2+ edema.  Clinical Significance: HFpEF is a significant contributor to morbidity and mortality, especially in the elderly.  Risk: Risk of acute decompensation, pulmonary edema, and hospitalization.  Monitoring Requirement: Close monitoring of weight, blood pressure, electrolytes, and renal function.  Medical Decision Made Today: Intensify HFpEF treatment.  Management Plan:  Initiate Jardiance  10 mg daily. SGLT2 inhibitors are guideline-directed therapy for HFpEF, demonstrating improved outcomes in this population.  Continue spironolactone   (Aldactone ) 25 mg daily. MRA therapy is indicated for HFpEF to reduce hospitalizations.  Continue bumetanide  (Bumex ) 1 mg daily.  Order CBC with differential, Comprehensive Metabolic Panel, Hemoglobin A1c, Brain Natriuretic Peptide (BNP), T4 Free, and Urine Albumin/Creatinine Ratio to assess baseline values and monitor for potential side effects of new medications.  Follow-up: Recheck labs in 2-4 weeks and schedule a follow-up appointment to assess response to therapy and adjust medications as needed.  Education: Provide education on sodium restriction, fluid management, and recognizing signs and symptoms of worsening heart failure.  Reason for continued high-complexity management: The patient's HFpEF requires careful titration of medications and close monitoring to prevent decompensation and optimize outcomes.  Hypertension:  Diagnostic Status: Controlled, but with recent elevated systolic readings (134/58 today, 142/68 on 08/30/23).  Objective Data: BP 134/58.  Clinical Significance: Uncontrolled hypertension increases the risk of stroke, MI, and renal disease.  Risk: Risk of end-organ damage if blood pressure is not adequately controlled.  Monitoring Requirement: Regular blood pressure monitoring.  Medical Decision Made Today: Continue current antihypertensive regimen.  Management Plan:  Continue telmisartan  (Micardis ) 80 mg daily and metoprolol  tartrate (Lopressor ) 50 mg twice daily.  Advise patient to monitor blood pressure at home and report any significant changes.  Follow-up: Monitor blood pressure at next visit.  Education: Reinforce the importance of medication adherence and lifestyle modifications (low-sodium diet, regular exercise).  Reason for continued high-complexity management: The patient's hypertension requires ongoing monitoring and management to prevent cardiovascular events.  Paroxysmal Atrial Fibrillation:  Diagnostic Status: Stable on current anticoagulation.  Objective Data: No reported  symptoms related to heart rate.  Clinical Significance: Atrial fibrillation increases the risk of stroke.  Risk: Risk of thromboembolic events.  Monitoring Requirement: Monitor for symptoms of atrial fibrillation.  Medical Decision Made Today: Continue current anticoagulation.  Management Plan:  Continue apixaban  (Eliquis ) 5 mg twice daily.  Follow-up: Monitor for bleeding complications.  Education: Reinforce the importance of medication adherence and recognizing signs and symptoms of stroke.  Reason for continued high-complexity management: The patient's atrial fibrillation requires ongoing  anticoagulation to prevent stroke.  Pure Hyperglyceridemia:  Diagnostic Status: Well-controlled on current statin therapy.  Objective Data: Recent labs showed well-controlled lipids (LDL 71, CHOL 135).  Clinical Significance: Hypertriglyceridemia increases the risk of cardiovascular disease.  Risk: Risk of atherosclerotic cardiovascular disease.  Monitoring Requirement: Monitor lipid levels annually.  Medical Decision Made Today: Continue current statin therapy.  Management Plan:  Continue rosuvastatin  (Crestor ) 20 mg daily.  Follow-up: Monitor lipid levels at next annual check-up.  Education: Reinforce the importance of medication adherence and lifestyle modifications (low-fat diet, regular exercise).  Reason for continued high-complexity management: The patient's hypertriglyceridemia requires ongoing management to prevent cardiovascular events.  Stage III Chronic Kidney Disease:  Diagnostic Status: Stable.  Objective Data: No recent lab data available.  Clinical Significance: CKD increases the risk of cardiovascular disease and progression to end-stage renal disease.  Risk: Risk of progression to end-stage renal disease.  Monitoring Requirement: Monitor renal function regularly.  Medical Decision Made Today: Continue current management.  Management Plan:  Monitor renal function with CMP and urine albumin/creatinine  ratio.  Follow-up: Monitor renal function at next visit.  Education: Reinforce the importance of blood pressure control and avoiding nephrotoxic medications.  Reason for continued high-complexity management: The patient's CKD requires ongoing monitoring and management to prevent progression to end-stage renal disease.  Overactive Bladder:  Diagnostic Status: Stable.  Objective Data: No new complaints.  Clinical Significance: Impacts quality of life.  Risk: Falls, social isolation.  Monitoring Requirement: Monitor for side effects of medication.  Medical Decision Made Today: Continue current management.  Management Plan:  Continue solifenacin  (Vesicare ) 10 mg daily.  Follow-up: Monitor for side effects of medication.  Education: Reinforce the importance of medication adherence.  Reason for continued high-complexity management: The patient's overactive bladder requires ongoing management to improve quality of life.  Possible Obstructive Sleep Apnea (OSA):  Diagnostic Status: Suspected, based on STOP-BANG score and patient report of abnormal sleep study.  Objective Data: STOP-BANG score positive, patient reports abnormal sleep study and upcoming virtual visit.  Clinical Significance: OSA increases the risk of hypertension, atrial fibrillation, and heart failure.  Risk: Risk of cardiovascular events.  Monitoring Requirement: Follow-up on sleep study results.  Medical Decision Made Today: Await sleep study results and treatment plan.  Management Plan:  Advise patient to attend virtual visit regarding sleep study results.  Follow-up: Follow-up on sleep study results and treatment plan.  Education: Reinforce the importance of adherence to CPAP therapy if prescribed.  Reason for continued high-complexity management: The patient's suspected OSA requires ongoing management to prevent cardiovascular events.  MEDICATION SAFETY / INTERACTION REVIEW:  All current medications were reviewed for drug-drug interactions,  contraindications, renal/hepatic dosing considerations, and monitoring requirements.  Apixaban  (Eliquis ): Renal dosing is appropriate given the patient's age and stage III CKD. Monitor for bleeding complications.  Empagliflozin  (Jardiance ): Monitor renal function and for signs of urinary tract infections.  Spironolactone  (Aldactone ): Monitor potassium levels and renal function, especially with the addition of an SGLT2 inhibitor.  Bumetanide  (Bumex ): Monitor electrolytes and renal function.  Rosuvastatin  (Crestor ): Monitor liver function and for muscle pain.  Solifenacin  (Vesicare ): Monitor for anticholinergic side effects.  Metoprolol  Tartrate (Lopressor ): Monitor heart rate and blood pressure.  Telmisartan  (Micardis ): Monitor blood pressure and renal function.  Diltiazem  (Cardizem ): Monitor heart rate and blood pressure.  ORDERS  Orders Placed This Encounter  CBC with Auto Differential  Comprehensive Metabolic Panel  Hemoglobin A1C  Brain Natriuretic Peptide  T4, Free  Albumin/Creatinine Ratio, Urine  dilTIAZem  (CARDIZEM ) 30 MG  immediate release tablet  ELIQUIS  5 MG TABS tablet  empagliflozin  (JARDIANCE ) 10 MG tablet  spironolactone  (ALDACTONE ) 25 MG tablet  bumetanide  (BUMEX ) 1 MG tablet  dapagliflozin (Farxiga) 10 mg daily  Patient was educated on the importance of medication adherence, lifestyle modifications (low-sodium diet, regular exercise), and recognizing signs and symptoms of worsening cardiovascular disease. Shared decision-making was employed in the initiation of dapagliflozin, discussing the benefits and risks of the medication.  This encounter represents ongoing longitudinal management of a complex patient with multiple interacting comorbidities. The patient's care requires nuanced titration of medications, ongoing risk-mitigating surveillance, and a deep understanding of their coronary, structural, hemodynamic, and rhythm history.  THOM CHRISTELLA PARCEL, MD 12/04/2023  Closing Statement: This encounter  represents ongoing longitudinal management of a complex patient with multiple interacting comorbidities. The patient's care requires nuanced titration of medications, ongoing risk-mitigating surveillance, and a deep understanding of their coronary, structural, hemodynamic, and rhythm history.      MEDICATIONS  Continue medications as listed below unless otherwise noted:    Current Outpatient Medications:     dilTIAZem  (CARDIZEM ) 30 MG immediate release tablet, Take 1 tablet by mouth as needed (1 tablet daily as needed), Disp: 90 tablet, Rfl: 3    ELIQUIS  5 MG TABS tablet, Take 1 tablet by mouth 2 times daily, Disp: 180 tablet, Rfl: 3    empagliflozin  (JARDIANCE ) 10 MG tablet, Take 1 tablet by mouth daily, Disp: 90 tablet, Rfl: 3    spironolactone  (ALDACTONE ) 25 MG tablet, Take 1 tablet by mouth daily, Disp: 90 tablet, Rfl: 3    bumetanide  (BUMEX ) 1 MG tablet, Take 1 tablet by mouth daily, Disp: 90 tablet, Rfl: 3    rosuvastatin  (CRESTOR ) 20 MG tablet, Take 1 tablet by mouth once daily, Disp: 90 tablet, Rfl: 3    solifenacin  (VESICARE ) 10 MG tablet, Take 1 tablet by mouth daily, Disp: 30 tablet, Rfl: 5    metoprolol  tartrate (LOPRESSOR ) 50 MG tablet, Take 1 tablet by mouth 2 times daily, Disp: 180 tablet, Rfl: 3    telmisartan  (MICARDIS ) 80 MG tablet, Take 1 tablet by mouth daily, Disp: 90 tablet, Rfl: 3    Handicap Placard MISC, by Does not apply route, Disp: 1 each, Rfl: 0    calcium  carbonate (OYSTER SHELL CALCIUM  500 MG) 1250 (500 Ca) MG tablet, Take 1 tablet by mouth daily, Disp: , Rfl:     Cholecalciferol 50 MCG (2000 UT) CAPS, Take 1 capsule by mouth daily, Disp: , Rfl:     ORDERS  Orders Placed This Encounter    CBC with Auto Differential     Standing Status:   Future     Expected Date:   12/04/2023     Expiration Date:   12/03/2024    Comprehensive Metabolic Panel     Standing Status:   Future     Expected Date:   12/18/2023     Expiration Date:   12/03/2024    Hemoglobin A1C     Standing Status:   Future      Expected Date:   12/04/2023     Expiration Date:   12/03/2024    Brain Natriuretic Peptide    T4, Free     Standing Status:   Future     Expected Date:   12/04/2023     Expiration Date:   01/04/2024    Albumin/Creatinine Ratio, Urine     Standing Status:   Future     Expected Date:   03/03/2024  Expiration Date:   04/02/2024    dilTIAZem  (CARDIZEM ) 30 MG immediate release tablet     Sig: Take 1 tablet by mouth as needed (1 tablet daily as needed)     Dispense:  90 tablet     Refill:  3    ELIQUIS  5 MG TABS tablet     Sig: Take 1 tablet by mouth 2 times daily     Dispense:  180 tablet     Refill:  3    empagliflozin  (JARDIANCE ) 10 MG tablet     Sig: Take 1 tablet by mouth daily     Dispense:  90 tablet     Refill:  3    spironolactone  (ALDACTONE ) 25 MG tablet     Sig: Take 1 tablet by mouth daily     Dispense:  90 tablet     Refill:  3    bumetanide  (BUMEX ) 1 MG tablet     Sig: Take 1 tablet by mouth daily     Dispense:  90 tablet     Refill:  3       THOM CHRISTELLA PARCEL, MD  12/04/2023  10:51 AM            "

## 2023-12-04 NOTE — Telephone Encounter (Signed)
"  Pharmacist Mo -called asking for clarification on medication Spironolactone  , which he believes interacts with the Telmisartan    "

## 2023-12-05 NOTE — Telephone Encounter (Signed)
 Pharmacy notified.

## 2023-12-09 ENCOUNTER — Encounter: Payer: Medicare (Managed Care) | Attending: Physician Assistant | Primary: Family Medicine

## 2023-12-09 NOTE — Telephone Encounter (Signed)
"  Patient called to cancel appt for today. States she never got her CPAP because no one ever called her to get it or get delivered. Please call patient and help her figure out how to get CPAP.   "

## 2023-12-09 NOTE — Progress Notes (Deleted)
 "[facility]  [address]  [place], [place] [address]  [phone]    Patient Name:  Brandy Vargas  Date of Birth:  1940-10-22    [name], was evaluated through a synchronous (real-time) audio-video encounter. The patient (or guardian if applicable) is aware that this is a billable service, which includes applicable co-pays. This Virtual Visit was conducted with patient's (and/or legal guardian's) consent. Patient identification was verified, and a caregiver was present when appropriate.   The patient was located at {Telehealth POS - Patient:[id number]}  Provider was located at {Telehealth POS - Provider:[id number]}  Confirm you are appropriately licensed, registered, or certified to deliver care in the state where the patient is located as indicated above. If you are not or unsure, please re-schedule the visit: {Telehealth State License:[id number]}         --Delon LITTIE Magnus, PA on 12/06/2023 at 1:21 PM             Office Visit 12/06/2023    CHIEF COMPLAINT:    No chief complaint on file.      HISTORY OF PRESENT ILLNESS:  Patient is being seen today via virtual visit. Pt had an HST on 03/25/23 with an AHI of 11/hr with desaturations to 81%. Pt had desaturations < 89% for 71.5 mins of the study. Pt had a CPAP titration on 05/13/23 with CPAP 9cm H2O improving sleep disordered breathing. Pt is on APAP 7-12cm H2O.           08/15/2023    11:38 AM   Sleep Medicine   Sitting and reading 1   Watching TV 2   Sitting, inactive in a public place (e.g. a theatre or a meeting) 0   As a passenger in a car for an hour without a break 0   Lying down to rest in the afternoon when circumstances permit 2   Sitting and talking to someone 0   Sitting quietly after a lunch without alcohol 0   In a car, while stopped for a few minutes in traffic 0   Epworth Sleepiness Score 5        Past Medical History:   Diagnosis Date    Arthritis     Atrial fibrillation (HCC)     Breast cancer (HCC) 2015    right      COVID-19 vaccine series completed 04/08/2019    Moderna    GERD (gastroesophageal reflux disease)     Hypertension     Overactive bladder        Patient Active Problem List   Diagnosis    Arthritis of left hip    Pure hyperglyceridemia    History of total hip arthroplasty, left    Paroxysmal atrial fibrillation (HCC)    Osteoarthritis    Vitamin D deficiency    Essential hypertension    Secondary hypercoagulable state    Chronic renal disease, stage III (HCC) [698720]    OAB (overactive bladder)           Past Surgical History:   Procedure Laterality Date    APPENDECTOMY      BREAST LUMPECTOMY Right     2015    BREAST SURGERY Right 2015    cancer    KNEE ARTHROSCOPY      TUBAL LIGATION  1976           Social History     Socioeconomic History    Marital status: Widowed  Spouse name: Not on file    Number of children: Not on file    Years of education: Not on file    Highest education level: Not on file   Occupational History    Not on file   Tobacco Use    Smoking status: Never    Smokeless tobacco: Never   Substance and Sexual Activity    Alcohol use: Never    Drug use: Never    Sexual activity: Not on file   Other Topics Concern    Not on file   Social History Narrative    Not on file     Social Drivers of Health     Financial Resource Strain: Low Risk  (10/25/2022)    Overall Financial Resource Strain (CARDIA)     Difficulty of Paying Living Expenses: Not hard at all   Food Insecurity: Not on file (04/08/2023)   Transportation Needs: Unknown (10/25/2022)    PRAPARE - Therapist, Art (Medical): Not on file     Lack of Transportation (Non-Medical): No   Physical Activity: Insufficiently Active (04/07/2023)    Exercise Vital Sign     Days of Exercise per Week: 2 days     Minutes of Exercise per Session: 20 min   Stress: Not on file   Social Connections: Unknown (07/27/2020)    Received from [facility]    Social Connections     Frequency of Communication with Friends and Family: Not asked      Frequency of Social Gatherings with Friends and Family: Not asked   Intimate Partner Violence: Unknown (07/27/2020)    Received from [facility]    Intimate Partner Violence     Fear of Current or Ex-Partner: Not asked     Emotionally Abused: Not asked     Physically Abused: Not asked     Sexually Abused: Not asked   Housing Stability: Unknown (04/08/2023)    Housing Stability Vital Sign     Unable to Pay for Housing in the Last Year: Not on file     Number of Times Moved in the Last Year: 0     Homeless in the Last Year: No         Family History   Problem Relation Age of Onset    Breast Cancer Maternal Aunt     Diabetes Paternal Grandmother     Diabetes Sister     Heart Attack Father     Hypertension Mother     High Cholesterol Mother          Allergies   Allergen Reactions    Omeprazole Magnesium Itching         Current Outpatient Medications   Medication Sig    dilTIAZem  (CARDIZEM ) 30 MG immediate release tablet Take 1 tablet by mouth as needed (1 tablet daily as needed)    ELIQUIS  5 MG TABS tablet Take 1 tablet by mouth 2 times daily    empagliflozin  (JARDIANCE ) 10 MG tablet Take 1 tablet by mouth daily    spironolactone  (ALDACTONE ) 25 MG tablet Take 1 tablet by mouth daily    bumetanide  (BUMEX ) 1 MG tablet Take 1 tablet by mouth daily    rosuvastatin  (CRESTOR ) 20 MG tablet Take 1 tablet by mouth once daily    solifenacin  (VESICARE ) 10 MG tablet Take 1 tablet by mouth daily    metoprolol  tartrate (LOPRESSOR ) 50 MG tablet Take 1 tablet by mouth 2 times daily  telmisartan  (MICARDIS ) 80 MG tablet Take 1 tablet by mouth daily    Handicap Placard MISC by Does not apply route    calcium  carbonate (OYSTER SHELL CALCIUM  500 MG) 1250 (500 Ca) MG tablet Take 1 tablet by mouth daily    Cholecalciferol 50 MCG (2000 UT) CAPS Take 1 capsule by mouth daily     No current facility-administered medications for this visit.             REVIEW OF SYSTEMS:   CONSTITUTIONAL:   There is no history of fever, chills, night sweats.  Patient has *** weight loss or weight gain.  *** for fatigue and hypersomnia and is *** CPAP.      CARDIAC:   No chest pain, pressure, discomfort, palpitations, orthopnea, murmurs, or edema.   GI:   No dysphagia, heartburn reflux, nausea/vomiting, diarrhea, abdominal pain, or bleeding.   NEURO:   There is no history of AMS, persistent headache, decreased level of consciousness, seizures, or motor or sensory deficits.    VIRTUAL EXAM  PHYSICAL EXAMINATION:  [ INSTRUCTIONS:  [x]  Indicates a positive item  []  Indicates a negative item   Vital Signs: (As obtained by patient/caregiver at home)  There were no vitals taken for this visit.     Constitutional: [x]  Appears well-developed and well-nourished [x]  No apparent distress      []  Abnormal     Mental status: [x]  Alert and awake  [x]  Oriented to person/place/time [x]  Able to follow commands    []  Abnormal -     Eyes:   EOM    [x]   Normal    []  Abnormal -   Sclera  [x]   Normal    []  Abnormal -          Discharge [x]   None visible   []  Abnormal -    HENT: [x]  Normocephalic, atraumatic  []  Abnormal -  [x]  Mouth/Throat: Mucous membranes are moist    External Ears [x]  Normal  []  Abnormal -    Neck: [x]  No visualized mass []  Abnormal -     Pulmonary/Chest: [x]  Respiratory effort normal   [x]  No visualized signs of difficulty breathing or respiratory distress        []  Abnormal -      Musculoskeletal:   [x]  Normal gait with no signs of ataxia         [x]  Normal range of motion of neck        []  Abnormal -     Neurological:        [x]  No Facial Asymmetry (Cranial nerve 7 motor function) (limited exam due to video visit)          [x]  No gaze palsy        []  Abnormal -         Skin:        [x]  No significant exanthematous lesions or discoloration noted on facial skin         []  Abnormal -            Psychiatric:       [x]  Normal Affect []  Abnormal -       [x]  No Hallucinations    Other pertinent observable physical exam findings:-      ASSESSMENT:  (Medical Decision  Making)   {No diagnosis found. (Refresh or delete this SmartLink)}     PLAN:      No orders of the defined types were placed in this encounter.  No orders of the defined types were placed in this encounter.         Collaborating Physician: Dr. Raymona      I spent at least *** minutes with this established patient, and >50% of the time was spent counseling and/or coordinating care regarding ***.        Delon LITTIE Magnus, PA  Electronically signed  "

## 2023-12-09 NOTE — Telephone Encounter (Signed)
"  Unable to leave VM due to mailbox being full.     "

## 2023-12-24 ENCOUNTER — Ambulatory Visit
Admit: 2023-12-24 | Discharge: 2023-12-24 | Payer: Medicare (Managed Care) | Attending: Family Medicine | Primary: Family Medicine

## 2023-12-24 MED ORDER — AZITHROMYCIN 250 MG PO TABS
250 | ORAL_TABLET | ORAL | 0 refills | 5.00000 days | Status: DC
Start: 2023-12-24 — End: 2024-01-22

## 2023-12-24 NOTE — Progress Notes (Signed)
 "  Have you been to the ER, urgent care clinic since your last visit?  Hospitalized since your last visit?"    NO    "Have you seen or consulted any other health care providers outside our system since your last visit?"    NO

## 2023-12-24 NOTE — Progress Notes (Signed)
 "SUBJECTIVE:   Brandy Vargas is a 83 y.o. female who has a past medical history significant for hypertension, high cholesterol, overactive bladder and paroxysmal atrial fibrillation.  Patient presents today complaining of a 5-day history of productive cough and congestion in her head and chest.  No fever or bodyaches is reported.  Denies chest pain, shortness of breath, orthopnea or PND.  Current medicines are listed in the EMR and reviewed today.  Since I have last seen her, her cardiologist has added Jardiance  for her heart failure.    HPI  See above    Past Medical History, Past Surgical History, Family history, Social History, and Medications were all reviewed with the patient today and updated as necessary.       Current Outpatient Medications   Medication Sig Dispense Refill    dilTIAZem  (CARDIZEM ) 30 MG immediate release tablet Take 1 tablet by mouth as needed (1 tablet daily as needed) 90 tablet 3    ELIQUIS  5 MG TABS tablet Take 1 tablet by mouth 2 times daily 180 tablet 3    empagliflozin  (JARDIANCE ) 10 MG tablet Take 1 tablet by mouth daily 90 tablet 3    spironolactone  (ALDACTONE ) 25 MG tablet Take 1 tablet by mouth daily 90 tablet 3    bumetanide  (BUMEX ) 1 MG tablet Take 1 tablet by mouth daily 90 tablet 3    rosuvastatin  (CRESTOR ) 20 MG tablet Take 1 tablet by mouth once daily 90 tablet 3    solifenacin  (VESICARE ) 10 MG tablet Take 1 tablet by mouth daily 30 tablet 5    metoprolol  tartrate (LOPRESSOR ) 50 MG tablet Take 1 tablet by mouth 2 times daily 180 tablet 3    telmisartan  (MICARDIS ) 80 MG tablet Take 1 tablet by mouth daily 90 tablet 3    calcium  carbonate (OYSTER SHELL CALCIUM  500 MG) 1250 (500 Ca) MG tablet Take 1 tablet by mouth daily      Cholecalciferol 50 MCG (2000 UT) CAPS Take 1 capsule by mouth daily      Handicap Placard MISC by Does not apply route 1 each 0     No current facility-administered medications for this visit.     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     COVID-19 vaccine series completed 04/08/2019    Moderna    GERD (gastroesophageal reflux disease)     Hypertension     Overactive bladder      Past Surgical History:   Procedure Laterality Date    APPENDECTOMY      BREAST LUMPECTOMY Right     2015    BREAST SURGERY Right 2015    cancer    KNEE ARTHROSCOPY      TUBAL LIGATION  1976     Family History   Problem Relation Age of Onset    Breast  Cancer Maternal Aunt     Diabetes Paternal Grandmother     Diabetes Sister     Heart Attack Father     Hypertension Mother     High Cholesterol Mother      Social History     Tobacco Use    Smoking status: Never    Smokeless tobacco: Never   Substance Use Topics    Alcohol use: Never         Review of Systems  See above    OBJECTIVE:  BP 118/74   Pulse 58   Ht 1.6 m (5' 3)   Wt 88.2 kg (194 lb 6.4 oz)   SpO2 99%   BMI 34.44 kg/m      Physical Exam  Constitutional:       General: She is not in acute distress.     Appearance: Normal appearance. She is not ill-appearing.   HENT:      Head: Normocephalic and atraumatic.   Cardiovascular:      Rate and Rhythm: Normal rate and regular rhythm.      Heart sounds: Normal heart sounds. No murmur heard.  Pulmonary:      Effort: Pulmonary effort is normal.      Breath sounds: Rhonchi present. No wheezing.   Musculoskeletal:         General: Normal range of motion.      Cervical back: Normal range of motion and neck supple.      Right lower leg: No edema.      Left lower leg: No edema.   Skin:      General: Skin is warm.      Findings: No rash.   Neurological:      Mental Status: She is alert and oriented to person, place, and time.   Psychiatric:         Mood and Affect: Mood normal.         Behavior: Behavior normal.         Thought Content: Thought content normal.         Judgment: Judgment normal.         Medical problems and test results were reviewed with the patient today.         ASSESSMENT and PLAN    1.  Cough.  Appears to be bronchitis.  Has some over-the-counter Robitussin DM.  Continue as needed.    2.  Bronchitis.  Z-Pak as directed.  Return if symptoms persist or worsen.    3.  Hypertension.  BP 118/74.  Continue current therapy.    4.  Atrial fibrillation.  Per cardiology.  No syncope or presyncope reported.    5.  Heart failure with preserved ejection fraction.  Continue Jardiance .  Per cardiology.  No orthopnea reported.    6.  High cholesterol.  Most recent LDL 71.  Continue statin.  Recheck at scheduled follow-up.    Elements of this note have been dictated using speech recognition software. As a result, errors of speech recognition may have occurred.    "

## 2024-01-14 ENCOUNTER — Other Ambulatory Visit: Admit: 2024-01-14 | Discharge: 2024-01-14 | Payer: Medicare (Managed Care) | Primary: Family Medicine

## 2024-01-14 DIAGNOSIS — E7800 Pure hypercholesterolemia, unspecified: Principal | ICD-10-CM

## 2024-01-14 LAB — COMPREHENSIVE METABOLIC PANEL
ALT: 14 U/L (ref 8–45)
AST: 20 U/L (ref 15–37)
Albumin/Globulin Ratio: 1.2 (ref 1.0–1.9)
Albumin: 3.5 g/dL (ref 3.2–4.6)
Alk Phosphatase: 106 U/L — ABNORMAL HIGH (ref 35–104)
Anion Gap: 10 mmol/L (ref 7–16)
BUN: 28 mg/dL — ABNORMAL HIGH (ref 8–23)
CO2: 28 mmol/L (ref 20–29)
Calcium: 9.1 mg/dL (ref 8.8–10.2)
Chloride: 99 mmol/L (ref 98–107)
Creatinine: 1.04 mg/dL (ref 0.60–1.10)
Est, Glom Filt Rate: 53 ml/min/1.73m2 — ABNORMAL LOW (ref >60)
Globulin: 3 g/dL (ref 2.3–3.5)
Glucose: 90 mg/dL (ref 70–99)
Potassium: 4.5 mmol/L (ref 3.5–5.1)
Sodium: 138 mmol/L (ref 136–145)
Total Bilirubin: 0.3 mg/dL (ref 0.0–1.2)
Total Protein: 6.5 g/dL (ref 6.3–8.2)

## 2024-01-14 LAB — LIPID PANEL
Chol/HDL Ratio: 3.2 (ref 0.0–5.0)
Cholesterol, Total: 153 mg/dL (ref 0–200)
HDL: 48 mg/dL (ref 40–60)
LDL Cholesterol: 84 mg/dL (ref 0–100)
Triglycerides: 104 mg/dL (ref 0–150)
VLDL Cholesterol Calculated: 21 mg/dL (ref 6–23)

## 2024-01-22 ENCOUNTER — Ambulatory Visit
Admit: 2024-01-22 | Discharge: 2024-01-22 | Payer: Medicare (Managed Care) | Attending: Family Medicine | Primary: Family Medicine

## 2024-01-22 VITALS — BP 98/62 | HR 53 | Ht 63.0 in | Wt 188.0 lb

## 2024-01-22 DIAGNOSIS — R531 Weakness: Principal | ICD-10-CM

## 2024-01-22 MED ORDER — SOLIFENACIN SUCCINATE 10 MG PO TABS
10 | ORAL_TABLET | Freq: Every day | ORAL | 5 refills | 60.00000 days | Status: AC
Start: 2024-01-22 — End: ?

## 2024-01-22 MED ORDER — BUMETANIDE 0.5 MG PO TABS
0.5 | ORAL_TABLET | Freq: Every day | ORAL | 5 refills | 30.00000 days | Status: AC
Start: 2024-01-22 — End: ?

## 2024-01-22 MED ORDER — TELMISARTAN 80 MG PO TABS
80 | ORAL_TABLET | Freq: Every day | ORAL | 3 refills | 90.00000 days | Status: AC
Start: 2024-01-22 — End: ?

## 2024-01-22 MED ORDER — METOPROLOL TARTRATE 50 MG PO TABS
50 | ORAL_TABLET | Freq: Two times a day (BID) | ORAL | 3 refills | 90.00000 days | Status: AC
Start: 2024-01-22 — End: ?

## 2024-01-22 NOTE — Progress Notes (Signed)
 Have you been to the ER, urgent care clinic since your last visit?  Hospitalized since your last visit?    NO    ?Have you seen or consulted any other health care providers outside our system since your last visit??    NO

## 2024-01-22 NOTE — Progress Notes (Signed)
 SUBJECTIVE:   Brandy Vargas is a 84 y.o. female who has a past medical history significant for hypertension, high cholesterol, overactive bladder and paroxysmal atrial fibrillation.  Patient presents today accompanied by her son, Brandy Vargas.  Patient reports that she has been feeling very weak and easily fatigued since at least a year but worse since Christmas.  She reports that she feels at times weak when she changes positions abruptly.  No syncope or presyncope is reported.  No chest pain or shortness of breath.  No dyspnea on exertion.  Current medicines are listed in the EMR and reviewed today.  Patient does report that her cardiologist has placed her on a fluid pill to help her with her edema.    HPI  See above    Past Medical History, Past Surgical History, Family history, Social History, and Medications were all reviewed with the patient today and updated as necessary.       Current Outpatient Medications   Medication Sig Dispense Refill    dilTIAZem  (CARDIZEM ) 30 MG immediate release tablet Take 1 tablet by mouth as needed (1 tablet daily as needed) 90 tablet 3    ELIQUIS  5 MG TABS tablet Take 1 tablet by mouth 2 times daily 180 tablet 3    empagliflozin  (JARDIANCE ) 10 MG tablet Take 1 tablet by mouth daily 90 tablet 3    spironolactone  (ALDACTONE ) 25 MG tablet Take 1 tablet by mouth daily 90 tablet 3    bumetanide  (BUMEX ) 1 MG tablet Take 1 tablet by mouth daily 90 tablet 3    rosuvastatin  (CRESTOR ) 20 MG tablet Take 1 tablet by mouth once daily 90 tablet 3    solifenacin  (VESICARE ) 10 MG tablet Take 1 tablet by mouth daily 30 tablet 5    metoprolol  tartrate (LOPRESSOR ) 50 MG tablet Take 1 tablet by mouth 2 times daily 180 tablet 3    telmisartan  (MICARDIS ) 80 MG tablet Take 1 tablet by mouth daily 90 tablet 3    Handicap Placard MISC by Does not apply route 1 each 0    calcium  carbonate (OYSTER SHELL CALCIUM  500 MG) 1250 (500 Ca) MG tablet Take 1 tablet by mouth daily      Cholecalciferol 50 MCG (2000  UT) CAPS Take 1 capsule by mouth daily      azithromycin  (ZITHROMAX ) 250 MG tablet Take 2 Tablets with food by mouth first day, then 1 tab with food by mouth daily for days 2 through 5. (Patient not taking: Reported on 01/22/2024) 6 tablet 0     No current facility-administered medications for this visit.     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     COVID-19 vaccine series completed 04/08/2019    Moderna    GERD (gastroesophageal reflux  disease)     Hypertension     Overactive bladder      Past Surgical History:   Procedure Laterality Date    APPENDECTOMY      BREAST LUMPECTOMY Right     2015    BREAST SURGERY Right 2015    cancer    KNEE ARTHROSCOPY      TUBAL LIGATION  1976     Family History   Problem Relation Age of Onset    Breast Cancer Maternal Aunt     Diabetes Paternal Grandmother     Diabetes Sister     Heart Attack Father     Hypertension Mother     High Cholesterol Mother      Social History     Tobacco Use    Smoking status: Never    Smokeless tobacco: Never   Substance Use Topics    Alcohol use: Never         Review of Systems  See above    OBJECTIVE:  BP 98/62 (BP Site: Left Upper Arm, Patient Position: Sitting, BP Cuff Size: Large Adult)   Pulse 53   Ht 1.6 m (5' 3)   Wt 85.3 kg (188 lb)   SpO2 95%   BMI 33.30 kg/m      Physical Exam  Constitutional:       General: She is not in acute distress.     Appearance: Normal  appearance. She is not ill-appearing.   HENT:      Head: Normocephalic and atraumatic.   Cardiovascular:      Rate and Rhythm: Normal rate and regular rhythm.      Heart sounds: Normal heart sounds. No murmur heard.  Pulmonary:      Effort: Pulmonary effort is normal.      Breath sounds: Normal breath sounds. No wheezing or rhonchi.   Musculoskeletal:         General: Normal range of motion.      Cervical back: Normal range of motion and neck supple.      Right lower leg: No edema.      Left lower leg: No edema.   Skin:     General: Skin is warm.      Findings: No rash.   Neurological:      Mental Status: She is alert and oriented to person, place, and time.   Psychiatric:         Mood and Affect: Mood normal.         Behavior: Behavior normal.         Thought Content: Thought content normal.         Judgment: Judgment normal.         Medical problems and test results were reviewed with the patient today.         ASSESSMENT and PLAN    1.  Weakness.  Current blood pressure is 98/62.  Apical heart rate around 55 beats a minute.  It is conceivable that the combination of Micardis , Aldactone , Lopressor , Cardizem  and Bumex  may be causing some orthostasis as well as some mild bradycardia.  I believe it would be reasonable to decrease her Bumex  down to 0.5 mg and to decrease her Lopressor  from 50 mg twice a day to a half a tablet twice a day.  She will monitor her weight, her heart rate and her blood pressure.  If this does not help her weakness she will let me know.  She has an appointment in  the next week or 2 with her cardiologist and she is encouraged to discuss these changes and medication as well as her complaints with him as well.  Metabolic panel recently performed shows normal electrolytes, renal function and hepatic function.    2.  Fatigue.  As above.  Further workup pending responsiveness to these medication changes.    3.  Hypertension.  As above.    4.  High cholesterol.  LDL is 84.  Previously 71.  Continue  statin.  Liver enzymes remain normal.    5.  Atrial fibrillation.  No syncope or presyncope reported.    6.  Heart failure with preserved ejection fraction.  Continue follow-up with cardiology as scheduled.    Elements of this note have been dictated using speech recognition software. As a result, errors of speech recognition may have occurred.

## 2024-02-05 ENCOUNTER — Ambulatory Visit
Admit: 2024-02-05 | Discharge: 2024-02-05 | Payer: Medicare (Managed Care) | Attending: Cardiovascular Disease | Primary: Family Medicine

## 2024-02-05 DIAGNOSIS — Z6835 Body mass index (BMI) 35.0-35.9, adult: Principal | ICD-10-CM

## 2024-02-05 NOTE — Progress Notes (Deleted)
 DATA REVIEWED TODAY  (Used in the synthesis of the patients longitudinal care plan and personally reviewed and interpreted by me.)  Prior Note Review (12/04/2023):  The patient is an 84 year old female with a complex cardiovascular and metabolic history, including HFpEF (worsening with new lower extremity edema and weight gain), essential hypertension (generally controlled, with recent mild systolic elevations), paroxysmal atrial fibrillation (stable on Eliquis ), pure hyperglyceridemia (well controlled on statin), and stage III chronic kidney disease (stable, no recent labs). STOP-BANG screening for OSA was completed and positive, with the patient awaiting sleep study results and likely CPAP initiation. Medication regimen was reviewed and optimized, including the addition of empagliflozin  for HFpEF. The plan emphasized ongoing surveillance, medication adherence, and close monitoring for decompensation or adverse events.  Laboratory Studies:  Lipid Panel  The lipid panel dated prior to 12/04/2023 was personally reviewed and interpreted.  LDL 71 mg/dL, Total Cholesterol 864 mg/dL, demonstrating well-controlled lipid profile.  No evidence of significant dyslipidemia or atherogenic risk on current therapy.  Interpretation: Lipids are well controlled and stable; continue current statin therapy.  Renal Function  No new laboratory data available as of 12/04/2023; prior CKD was staged as stage III (GFR 30-59 mL/min/1.7m).  Interpretation: CKD stage III, stable by clinical assessment; ongoing laboratory monitoring planned.  Cardiac and Other Diagnostics:  Electrocardiogram (ECG)  The ECG dated 12/04/2023 was personally reviewed and interpreted.  Sinus rhythm with nonspecific ST-T changes.  No acute ischemic or arrhythmic findings.  Interpretation: ECG is stable; no new arrhythmia or ischemia detected.  Screening Tools:  STOP-BANG (Obstructive Sleep Apnea Screening)  STOP-BANG was completed and positive as of 12/04/2023.  OSA  diagnosis suspected; patient awaiting sleep study results.  No need to repeat screening at this time.  Summary:  Data reviewed demonstrate stable control of atrial fibrillation and lipids, with worsening HFpEF and persistent stage III CKD (no new labs since last visit). STOP-BANG screening for OSA has been completed and documented; repeat is not indicated. Ongoing close monitoring and medication titration remain essential for this patients complex cardiac and metabolic comorbidities.

## 2024-02-19 ENCOUNTER — Inpatient Hospital Stay: Payer: Medicare (Managed Care)

## 2024-02-21 ENCOUNTER — Encounter: Payer: Medicare (Managed Care) | Primary: Family Medicine
